# Patient Record
Sex: Female | Born: 1950
Health system: Southern US, Community
[De-identification: ages and names within clinical notes are randomized; demographics above are authoritative.]

## PROBLEM LIST (undated history)

## (undated) DIAGNOSIS — F419 Anxiety disorder, unspecified: Secondary | ICD-10-CM

## (undated) DIAGNOSIS — E782 Mixed hyperlipidemia: Secondary | ICD-10-CM

## (undated) DIAGNOSIS — K429 Umbilical hernia without obstruction or gangrene: Secondary | ICD-10-CM

## (undated) DIAGNOSIS — M1990 Unspecified osteoarthritis, unspecified site: Secondary | ICD-10-CM

## (undated) DIAGNOSIS — T7840XA Allergy, unspecified, initial encounter: Secondary | ICD-10-CM

## (undated) DIAGNOSIS — E039 Hypothyroidism, unspecified: Secondary | ICD-10-CM

## (undated) DIAGNOSIS — G43909 Migraine, unspecified, not intractable, without status migrainosus: Secondary | ICD-10-CM

## (undated) DIAGNOSIS — R002 Palpitations: Secondary | ICD-10-CM

## (undated) DIAGNOSIS — M109 Gout, unspecified: Secondary | ICD-10-CM

## (undated) DIAGNOSIS — B009 Herpesviral infection, unspecified: Secondary | ICD-10-CM

## (undated) DIAGNOSIS — G3184 Mild cognitive impairment, so stated: Secondary | ICD-10-CM

## (undated) DIAGNOSIS — J45909 Unspecified asthma, uncomplicated: Secondary | ICD-10-CM

## (undated) DIAGNOSIS — E119 Type 2 diabetes mellitus without complications: Secondary | ICD-10-CM

## (undated) DIAGNOSIS — K589 Irritable bowel syndrome without diarrhea: Secondary | ICD-10-CM

## (undated) DIAGNOSIS — G4730 Sleep apnea, unspecified: Secondary | ICD-10-CM

## (undated) DIAGNOSIS — F32A Depression, unspecified: Secondary | ICD-10-CM

## (undated) DIAGNOSIS — K219 Gastro-esophageal reflux disease without esophagitis: Secondary | ICD-10-CM

## (undated) DIAGNOSIS — IMO0001 Reserved for inherently not codable concepts without codable children: Secondary | ICD-10-CM

## (undated) DIAGNOSIS — I10 Essential (primary) hypertension: Secondary | ICD-10-CM

## (undated) DIAGNOSIS — H269 Unspecified cataract: Secondary | ICD-10-CM

## (undated) DIAGNOSIS — G4733 Obstructive sleep apnea (adult) (pediatric): Secondary | ICD-10-CM

## (undated) HISTORY — DX: Migraine, unspecified, not intractable, without status migrainosus: G43.909

## (undated) HISTORY — PX: EYE SURGERY: SHX253

## (undated) HISTORY — PX: COLONOSCOPY: SHX174

## (undated) HISTORY — DX: Allergy, unspecified, initial encounter: T78.40XA

## (undated) HISTORY — DX: Umbilical hernia without obstruction or gangrene: K42.9

## (undated) HISTORY — DX: Palpitations: R00.2

## (undated) HISTORY — DX: Depression, unspecified: F32.A

## (undated) HISTORY — DX: Obstructive sleep apnea (adult) (pediatric): G47.33

## (undated) HISTORY — DX: Unspecified cataract: H26.9

## (undated) HISTORY — DX: Anxiety disorder, unspecified: F41.9

## (undated) HISTORY — DX: Reserved for inherently not codable concepts without codable children: IMO0001

## (undated) HISTORY — DX: Hypothyroidism, unspecified: E03.9

## (undated) HISTORY — DX: Gout, unspecified: M10.9

## (undated) HISTORY — DX: Sleep apnea, unspecified: G47.30

## (undated) HISTORY — DX: Herpesviral infection, unspecified: B00.9

## (undated) HISTORY — PX: UMBILICAL HERNIA REPAIR: SHX196

## (undated) HISTORY — PX: CHOLECYSTECTOMY: SHX55

## (undated) HISTORY — PX: BREAST BIOPSY: SHX20

## (undated) HISTORY — DX: Mixed hyperlipidemia: E78.2

## (undated) HISTORY — PX: NASAL SINUS SURGERY: SHX719

## (undated) HISTORY — DX: Irritable bowel syndrome, unspecified: K58.9

## (undated) HISTORY — PX: FOOT SURGERY: SHX648

## (undated) HISTORY — DX: Unspecified osteoarthritis, unspecified site: M19.90

## (undated) HISTORY — DX: Unspecified asthma, uncomplicated: J45.909

## (undated) HISTORY — DX: Essential (primary) hypertension: I10

## (undated) HISTORY — PX: HERNIA REPAIR: SHX51

## (undated) HISTORY — DX: Gastro-esophageal reflux disease without esophagitis: K21.9

## (undated) HISTORY — DX: Type 2 diabetes mellitus without complications: E11.9

---

## 2004-01-21 ENCOUNTER — Ambulatory Visit: Payer: Self-pay

## 2004-10-05 ENCOUNTER — Emergency Department: Payer: Self-pay | Admitting: Internal Medicine

## 2004-10-05 ENCOUNTER — Other Ambulatory Visit: Payer: Self-pay

## 2005-04-01 ENCOUNTER — Ambulatory Visit: Payer: Self-pay | Admitting: Family Medicine

## 2005-11-03 ENCOUNTER — Ambulatory Visit: Payer: Self-pay | Admitting: Family Medicine

## 2006-07-22 LAB — HM COLONOSCOPY: HM Colonoscopy: NORMAL

## 2006-10-24 DIAGNOSIS — K219 Gastro-esophageal reflux disease without esophagitis: Secondary | ICD-10-CM | POA: Insufficient documentation

## 2006-10-24 DIAGNOSIS — E039 Hypothyroidism, unspecified: Secondary | ICD-10-CM | POA: Insufficient documentation

## 2006-10-24 DIAGNOSIS — M1990 Unspecified osteoarthritis, unspecified site: Secondary | ICD-10-CM | POA: Insufficient documentation

## 2007-05-18 DIAGNOSIS — T50905A Adverse effect of unspecified drugs, medicaments and biological substances, initial encounter: Secondary | ICD-10-CM | POA: Insufficient documentation

## 2007-09-13 ENCOUNTER — Ambulatory Visit: Payer: Self-pay | Admitting: Family Medicine

## 2007-12-13 DIAGNOSIS — M109 Gout, unspecified: Secondary | ICD-10-CM | POA: Insufficient documentation

## 2008-01-05 ENCOUNTER — Ambulatory Visit: Payer: Self-pay | Admitting: General Surgery

## 2008-01-12 ENCOUNTER — Ambulatory Visit: Payer: Self-pay | Admitting: General Surgery

## 2008-05-22 ENCOUNTER — Ambulatory Visit: Payer: Self-pay | Admitting: Family Medicine

## 2008-06-05 ENCOUNTER — Ambulatory Visit: Payer: Self-pay | Admitting: Family Medicine

## 2009-05-13 ENCOUNTER — Ambulatory Visit: Payer: Self-pay | Admitting: Family Medicine

## 2010-02-23 ENCOUNTER — Ambulatory Visit: Payer: Self-pay | Admitting: Family Medicine

## 2010-10-19 ENCOUNTER — Ambulatory Visit: Payer: Self-pay | Admitting: Family Medicine

## 2012-11-02 ENCOUNTER — Ambulatory Visit: Payer: Self-pay | Admitting: Nurse Practitioner

## 2012-11-02 LAB — HM MAMMOGRAPHY: HM Mammogram: NORMAL

## 2012-12-29 ENCOUNTER — Ambulatory Visit (INDEPENDENT_AMBULATORY_CARE_PROVIDER_SITE_OTHER): Payer: 59 | Admitting: Cardiovascular Disease

## 2012-12-29 ENCOUNTER — Encounter: Payer: Self-pay | Admitting: Cardiovascular Disease

## 2012-12-29 VITALS — BP 128/62 | HR 75 | Ht 67.0 in | Wt 309.5 lb

## 2012-12-29 DIAGNOSIS — E669 Obesity, unspecified: Secondary | ICD-10-CM | POA: Insufficient documentation

## 2012-12-29 DIAGNOSIS — I4902 Ventricular flutter: Secondary | ICD-10-CM

## 2012-12-29 DIAGNOSIS — E1169 Type 2 diabetes mellitus with other specified complication: Secondary | ICD-10-CM | POA: Insufficient documentation

## 2012-12-29 DIAGNOSIS — E119 Type 2 diabetes mellitus without complications: Secondary | ICD-10-CM

## 2012-12-29 DIAGNOSIS — R079 Chest pain, unspecified: Secondary | ICD-10-CM

## 2012-12-29 DIAGNOSIS — I498 Other specified cardiac arrhythmias: Secondary | ICD-10-CM

## 2012-12-29 DIAGNOSIS — E785 Hyperlipidemia, unspecified: Secondary | ICD-10-CM

## 2012-12-29 DIAGNOSIS — R262 Difficulty in walking, not elsewhere classified: Secondary | ICD-10-CM

## 2012-12-29 DIAGNOSIS — I10 Essential (primary) hypertension: Secondary | ICD-10-CM

## 2012-12-29 NOTE — Assessment & Plan Note (Addendum)
Notes from Dr.  Carlynn Purl were reviewed . Prior EKG an EKG today are essentially normal . Normal clinical exam in terms of her heart . Chest pain concerning for ischemia. She has diabetes and significant family history . She is unable to treadmill secondary to severe knee pain. She reports 3 minutes would be her maximum. Given this, we will order a pharmacologic Myoview. It would likely have to be a today's study given her obesity

## 2012-12-29 NOTE — Assessment & Plan Note (Signed)
We have encouraged continued exercise, careful diet management in an effort to lose weight. 

## 2012-12-29 NOTE — Assessment & Plan Note (Signed)
We did discuss ordering a Holter monitor. Her husband works at [employer] and he will determine whether such testing could be done in extensively through the company. Meanwhile she will monitor her heart rate for any arrhythmia using the Smart phone applications.

## 2012-12-29 NOTE — Patient Instructions (Addendum)
You are doing well. No medication changes were made.  We will schedule you for a stress test, myoview for chest tightness No food the morning of the test No caffeine for 24 hours before the test We will call you with the results  Call the office if you would like propranolol for evening chest fluttering Monitor your rhythm using smart phone apps (cardiograph, etc)  Please call us if you have new issues that need to be addressed  Follow up in one month if you continue to have fluttering If stress test is positive, we will need to schedule a follow up appt  [facility] MYOVIEW  Your caregiver has ordered a Stress Test with nuclear imaging. The purpose of this test is to evaluate the blood supply to your heart muscle. This procedure is referred to as a "Non-Invasive Stress Test." This is because other than having an IV started in your vein, nothing is inserted or "invades" your body. Cardiac stress tests are done to find areas of poor blood flow to the heart by determining the extent of coronary artery disease (CAD). Some patients exercise on a treadmill, which naturally increases the blood flow to your heart, while others who are  unable to walk on a treadmill due to physical limitations have a pharmacologic/chemical stress agent called Lexiscan . This medicine will mimic walking on a treadmill by temporarily increasing your coronary blood flow.   Please note: these test may take anywhere between 2-4 hours to complete  Dates of procedure:  Thursday, OCT 15 at 10:00      AND     Friday, OCT 16 at 9:30  PLEASE REPORT TO [facility] MEDICAL MALL ENTRANCE  THE VOLUNTEERS AT THE FIRST DESK WILL DIRECT YOU WHERE TO GO    PLEASE NOTIFY THE OFFICE AT LEAST 24 HOURS IN ADVANCE IF YOU ARE UNABLE TO KEEP YOUR APPOINTMENT.  [phone] AND  PLEASE NOTIFY NUCLEAR MEDICINE AT [facility] AT LEAST 24 HOURS IN ADVANCE IF YOU ARE UNABLE TO KEEP YOUR APPOINTMENT. [phone]  How to prepare for your Myoview  test:  1. Do not eat or drink after midnight 2. No caffeine for 24 hours prior to test 3. No smoking 24 hours prior to test. 4. Your medication may be taken with water.  If your doctor stopped a medication because of this test, do not take that medication. 5. Ladies, please do not wear dresses.  Skirts or pants are appropriate. Please wear a short sleeve shirt. 6. No perfume, cologne or lotion. 7. Wear comfortable walking shoes. No heels!

## 2012-12-29 NOTE — Assessment & Plan Note (Signed)
Blood pressure is well controlled on today's visit. No changes made to the medications. 

## 2012-12-29 NOTE — Progress Notes (Signed)
Patient ID: Sarah Gordon, female    DOB: 01-18-1951, 62 y.o.   MRN: [id number]  HPI Comments: Sarah Gordon is a pleasant 62 year old woman, patient of Dr. Charlette Caffey, referred by Dr. Carlynn Purl for symptoms of chest fluttering and chest pain. Sarah Gordon has a history of diabetes, obesity. Strong family history of CAD. Notes from Dr. Charlette Caffey indicate history of obstructive sleep apnea, fibromyalgia, IBS, hypothyroidism, hypertension, obesity, migraines.  No prior smoking history   Sarah Gordon reports that Sarah Gordon has had fluttering in Sarah Gordon chest for 10-12 years. Workup at that time included stress test and EKG. Symptoms have been worse over the past 2 weeks. Now, after Sarah Gordon chest fluttering, palpitations, Sarah Gordon has significant chest tightening in Sarah Gordon mediastinal area. He has been staying longer and longer. Sometimes Sarah Gordon presents with exertion, sometimes with rest. Symptoms have been more in the evenings. Last night Sarah Gordon had an episode of palpitations, then after this resolved, had chest tightening lasting 15 minutes. Sarah Gordon had episode last night, then 4 days ago on Sunday evening, then 4 days prior to that on Wednesday evening. Sarah Gordon is otherwise active at baseline and takes care of a 78-year-old grandchild. Works with a church as a part-time job.  Reports that Sarah Gordon brother has coronary artery disease, had bypass surgery 10 years ago. Mother had questionable heart disease and was a long-term diabetic  Most recent LDL was 117, TSH 0.29, hematocrit 36 EKG shows normal sinus rhythm with rate 75 beats per minute, no significant ST or T wave changes   Outpatient Encounter Prescriptions as of 12/29/2012  Medication Sig Dispense Refill  . albuterol (PROVENTIL HFA;VENTOLIN HFA) 108 (90 BASE) MCG/ACT inhaler Inhale 2 puffs into the lungs every 4 (four) hours as needed for wheezing.      Marland Kitchen allopurinol (ZYLOPRIM) 300 MG tablet Take 300 mg by mouth daily.      . budesonide-formoterol (SYMBICORT) 160-4.5 MCG/ACT inhaler Inhale 2 puffs into the  lungs 2 (two) times daily as needed.       . cyclobenzaprine (FLEXERIL) 10 MG tablet Take 10 mg by mouth 2 (two) times daily as needed.       . gabapentin (NEURONTIN) 100 MG capsule Take 100 mg by mouth 3 (three) times daily as needed.      Marland Kitchen levothyroxine (SYNTHROID, LEVOTHROID) 125 MCG tablet Take 125 mcg by mouth daily before breakfast.      . losartan-hydrochlorothiazide (HYZAAR) 100-25 MG per tablet Take 1 tablet by mouth daily.      Marland Kitchen lovastatin (MEVACOR) 40 MG tablet Take 40 mg by mouth at bedtime.      . metformin (FORTAMET) 500 MG (OSM) 24 hr tablet Take 500 mg by mouth daily with breakfast.      . naproxen (NAPROSYN) 500 MG tablet Take 500 mg by mouth 2 (two) times daily with a meal.      . omeprazole (PRILOSEC) 20 MG capsule Take 20 mg by mouth daily.      . SUMAtriptan (IMITREX) 100 MG tablet Take 100 mg by mouth every 2 (two) hours as needed for migraine. May repeat in 2 hours if headache persists or recurs.      . traMADol (ULTRAM) 50 MG tablet Take 50 mg by mouth every 6 (six) hours as needed for pain.      . valACYclovir (VALTREX) 1000 MG tablet Take 1,000 mg by mouth 2 (two) times daily as needed.        Review of Systems  Constitutional: Negative.   HENT: Negative.  Eyes: Negative.   Respiratory: Negative.   Cardiovascular: Negative.   Gastrointestinal: Negative.   Endocrine: Negative.   Musculoskeletal: Negative.   Skin: Negative.   Allergic/Immunologic: Negative.   Neurological: Negative.   Hematological: Negative.   Psychiatric/Behavioral: Negative.   All other systems reviewed and are negative.    BP 128/62  Pulse 75  Ht 5\' 7"  (1.702 m)  Wt 309 lb 8 oz (140.388 kg)  BMI 48.46 kg/m2  Physical Exam  Nursing note and vitals reviewed. Constitutional: Sarah Gordon is oriented to person, place, and time. Sarah Gordon appears well-developed and well-nourished.  HENT:  Head: Normocephalic.  Nose: Nose normal.  Mouth/Throat: Oropharynx is clear and moist.  Eyes: Conjunctivae  are normal. Pupils are equal, round, and reactive to light.  Neck: Normal range of motion. Neck supple. No JVD present.  Cardiovascular: Normal rate, regular rhythm, S1 normal, S2 normal, normal heart sounds and intact distal pulses.  Exam reveals no gallop and no friction rub.   No murmur heard. Pulmonary/Chest: Effort normal and breath sounds normal. No respiratory distress. Sarah Gordon has no wheezes. Sarah Gordon has no rales. Sarah Gordon exhibits no tenderness.  Abdominal: Soft. Bowel sounds are normal. Sarah Gordon exhibits no distension. There is no tenderness.  Musculoskeletal: Normal range of motion. Sarah Gordon exhibits no edema and no tenderness.  Lymphadenopathy:    Sarah Gordon has no cervical adenopathy.  Neurological: Sarah Gordon is alert and oriented to person, place, and time. Coordination normal.  Skin: Skin is warm and dry. No rash noted. No erythema.  Psychiatric: Sarah Gordon has a normal mood and affect. Sarah Gordon behavior is normal. Judgment and thought content normal.    Assessment and Plan

## 2012-12-29 NOTE — Assessment & Plan Note (Signed)
Chronic knee pain 

## 2012-12-29 NOTE — Assessment & Plan Note (Signed)
LDL 117. Ideally is a diabetic he should be 100 or less. Options would include changing to a stronger statin such as Lipitor. Additional options include adding fenofibrate, zetia, or WelChol.

## 2013-01-08 ENCOUNTER — Ambulatory Visit: Payer: Self-pay | Admitting: Cardiovascular Disease

## 2013-01-08 DIAGNOSIS — R079 Chest pain, unspecified: Secondary | ICD-10-CM

## 2013-01-10 ENCOUNTER — Other Ambulatory Visit: Payer: Self-pay

## 2013-01-10 DIAGNOSIS — I498 Other specified cardiac arrhythmias: Secondary | ICD-10-CM

## 2013-01-10 DIAGNOSIS — R262 Difficulty in walking, not elsewhere classified: Secondary | ICD-10-CM

## 2013-01-10 DIAGNOSIS — R079 Chest pain, unspecified: Secondary | ICD-10-CM

## 2013-01-29 ENCOUNTER — Telehealth: Payer: Self-pay

## 2013-01-29 NOTE — Telephone Encounter (Signed)
Spoke w/ pt.  She is aware of results and will keep f/u appt, as she continues to have occasional fluttering.

## 2013-01-29 NOTE — Telephone Encounter (Signed)
Message copied by Marilynne Halsted on Mon Jan 29, 2013  4:55 PM ------      Message from: Antonieta Iba      Created: Sat Jan 27, 2013  9:50 PM       Stress test was normal       No ischemia noted,      Good heart function ------

## 2013-01-31 ENCOUNTER — Encounter: Payer: Self-pay | Admitting: Cardiovascular Disease

## 2013-01-31 ENCOUNTER — Encounter (INDEPENDENT_AMBULATORY_CARE_PROVIDER_SITE_OTHER): Payer: Self-pay

## 2013-01-31 ENCOUNTER — Ambulatory Visit (INDEPENDENT_AMBULATORY_CARE_PROVIDER_SITE_OTHER): Payer: 59 | Admitting: Cardiovascular Disease

## 2013-01-31 VITALS — BP 120/86 | HR 84 | Ht 67.0 in | Wt 305.8 lb

## 2013-01-31 DIAGNOSIS — E119 Type 2 diabetes mellitus without complications: Secondary | ICD-10-CM

## 2013-01-31 DIAGNOSIS — I10 Essential (primary) hypertension: Secondary | ICD-10-CM

## 2013-01-31 DIAGNOSIS — R002 Palpitations: Secondary | ICD-10-CM

## 2013-01-31 DIAGNOSIS — E669 Obesity, unspecified: Secondary | ICD-10-CM

## 2013-01-31 DIAGNOSIS — I4902 Ventricular flutter: Secondary | ICD-10-CM

## 2013-01-31 DIAGNOSIS — I498 Other specified cardiac arrhythmias: Secondary | ICD-10-CM

## 2013-01-31 DIAGNOSIS — E785 Hyperlipidemia, unspecified: Secondary | ICD-10-CM

## 2013-01-31 DIAGNOSIS — E1169 Type 2 diabetes mellitus with other specified complication: Secondary | ICD-10-CM

## 2013-01-31 NOTE — Assessment & Plan Note (Signed)
I suspect she is having APCs. Unable to exclude PVCs. Symptoms are very short lived, rare. She does not want any medications for them. If symptoms get worse, repeat monitors could be ordered or could start low-dose beta blockers.

## 2013-01-31 NOTE — Progress Notes (Signed)
Patient ID: Sarah Gordon, female    DOB: 09/24/1950, 62 y.o.   MRN: [id number]  HPI Comments: Sarah Gordon is a pleasant 62 year old woman, patient of Dr. Charlette Caffey, referred by Dr. Carlynn Purl for symptoms of chest fluttering and chest pain.  history of diabetes, obesity. Strong family history of CAD. Notes from Dr. Charlette Caffey indicate history of obstructive sleep apnea, fibromyalgia, IBS, hypothyroidism, hypertension, obesity, migraines.  No prior smoking history    fluttering in her chest for 10-12 years. Previous workup including stress test and EKG.  She continues to be active at baseline and takes care of a 25-year-old grandchild. Works with a church as a part-time job. She continues to have episodes of skipped beats that happened 3 or 4 times per week. She describes it as a couple extra skips and then symptoms resolved. She's not particularly bothered by it, just notices the extra beats. She's not interested in any additional medications. Otherwise she feels well, is trying to lose weight. Thyroid medicine was decreased slightly for TSH 0.297.   brother has coronary artery disease, had bypass surgery 10 years ago. Mother had questionable heart disease and was a long-term diabetic  Most recent LDL was 117, TSH 0.29, hematocrit 36 EKG shows normal sinus rhythm with rate 83 beats per minute, no significant ST or T wave changes   Outpatient Encounter Prescriptions as of 01/31/2013  Medication Sig  . albuterol (PROVENTIL HFA;VENTOLIN HFA) 108 (90 BASE) MCG/ACT inhaler Inhale 2 puffs into the lungs every 4 (four) hours as needed for wheezing.  Marland Kitchen allopurinol (ZYLOPRIM) 300 MG tablet Take 300 mg by mouth daily.  Marland Kitchen atorvastatin (LIPITOR) 40 MG tablet Take 40 mg by mouth at bedtime.   . budesonide-formoterol (SYMBICORT) 160-4.5 MCG/ACT inhaler Inhale 2 puffs into the lungs 2 (two) times daily as needed.   . cyclobenzaprine (FLEXERIL) 10 MG tablet Take 10 mg by mouth 2 (two) times daily as needed.   .  gabapentin (NEURONTIN) 100 MG capsule Take 100 mg by mouth 3 (three) times daily as needed.  Marland Kitchen levothyroxine (SYNTHROID, LEVOTHROID) 125 MCG tablet Take 125 mcg by mouth 6 days. Take 1/2 once a week.  . losartan-hydrochlorothiazide (HYZAAR) 100-25 MG per tablet Take 1 tablet by mouth daily.  . metformin (FORTAMET) 500 MG (OSM) 24 hr tablet Take 500 mg by mouth daily with breakfast.  . naproxen (NAPROSYN) 500 MG tablet Take 500 mg by mouth 2 (two) times daily with a meal.  . omeprazole (PRILOSEC) 20 MG capsule Take 20 mg by mouth daily.  . SUMAtriptan (IMITREX) 100 MG tablet Take 100 mg by mouth every 2 (two) hours as needed for migraine. May repeat in 2 hours if headache persists or recurs.  . traMADol (ULTRAM) 50 MG tablet Take 50 mg by mouth every 6 (six) hours as needed for pain.  . valACYclovir (VALTREX) 1000 MG tablet Take 1,000 mg by mouth 2 (two) times daily as needed.  . [DISCONTINUED] lovastatin (MEVACOR) 40 MG tablet Take 40 mg by mouth at bedtime.     Review of Systems  Constitutional: Negative.   HENT: Negative.   Eyes: Negative.   Respiratory: Negative.   Cardiovascular: Positive for palpitations.  Gastrointestinal: Negative.   Endocrine: Negative.   Musculoskeletal: Negative.   Skin: Negative.   Allergic/Immunologic: Negative.   Neurological: Negative.   Hematological: Negative.   Psychiatric/Behavioral: Negative.   All other systems reviewed and are negative.    BP 120/86  Pulse 84  Ht 5\' 7"  (1.702 m)  Wt 305 lb 12 oz (138.687 kg)  BMI 47.88 kg/m2  Physical Exam  Nursing note and vitals reviewed. Constitutional: She is oriented to person, place, and time. She appears well-developed and well-nourished.  HENT:  Head: Normocephalic.  Nose: Nose normal.  Mouth/Throat: Oropharynx is clear and moist.  Eyes: Conjunctivae are normal. Pupils are equal, round, and reactive to light.  Neck: Normal range of motion. Neck supple. No JVD present.  Cardiovascular: Normal  rate, regular rhythm, S1 normal, S2 normal, normal heart sounds and intact distal pulses.  Exam reveals no gallop and no friction rub.   No murmur heard. Pulmonary/Chest: Effort normal and breath sounds normal. No respiratory distress. She has no wheezes. She has no rales. She exhibits no tenderness.  Abdominal: Soft. Bowel sounds are normal. She exhibits no distension. There is no tenderness.  Musculoskeletal: Normal range of motion. She exhibits no edema and no tenderness.  Lymphadenopathy:    She has no cervical adenopathy.  Neurological: She is alert and oriented to person, place, and time. Coordination normal.  Skin: Skin is warm and dry. No rash noted. No erythema.  Psychiatric: She has a normal mood and affect. Her behavior is normal. Judgment and thought content normal.    Assessment and Plan

## 2013-01-31 NOTE — Assessment & Plan Note (Signed)
Encouraged her to stay on her Lipitor. Recently changed from alternate statin. Tolerating Lipitor with no myalgias. Goal LDL less than 100

## 2013-01-31 NOTE — Assessment & Plan Note (Signed)
Blood pressure is well controlled on today's visit. No changes made to the medications. 

## 2013-01-31 NOTE — Assessment & Plan Note (Signed)
Encouraged her to try regular exercise program, watch her diet

## 2013-01-31 NOTE — Patient Instructions (Signed)
You are doing well. No medication changes were made.  Please call us if you have new issues that need to be addressed before your next appt.  Your physician wants you to follow-up in: 12 months.  You will receive a reminder letter in the mail two months in advance. If you don't receive a letter, please call our office to schedule the follow-up appointment. 

## 2013-01-31 NOTE — Assessment & Plan Note (Signed)
We have encouraged continued exercise, careful diet management in an effort to lose weight. 

## 2013-02-26 ENCOUNTER — Ambulatory Visit (INDEPENDENT_AMBULATORY_CARE_PROVIDER_SITE_OTHER): Payer: 59

## 2013-02-26 ENCOUNTER — Ambulatory Visit (INDEPENDENT_AMBULATORY_CARE_PROVIDER_SITE_OTHER): Payer: 59 | Admitting: Podiatry

## 2013-02-26 ENCOUNTER — Encounter: Payer: Self-pay | Admitting: Podiatry

## 2013-02-26 VITALS — BP 133/76 | HR 91 | Resp 16 | Ht 67.0 in | Wt 302.0 lb

## 2013-02-26 DIAGNOSIS — M79671 Pain in right foot: Secondary | ICD-10-CM

## 2013-02-26 DIAGNOSIS — M79609 Pain in unspecified limb: Secondary | ICD-10-CM

## 2013-02-26 DIAGNOSIS — M76829 Posterior tibial tendinitis, unspecified leg: Secondary | ICD-10-CM

## 2013-02-26 DIAGNOSIS — M76821 Posterior tibial tendinitis, right leg: Secondary | ICD-10-CM

## 2013-02-26 NOTE — Patient Instructions (Signed)
Posterior Tibial Tendon Tendinitis  with Rehab  Tendonitis is a condition that is characterized by inflammation of a tendon or the lining (sheath) that surrounds it. The inflammation is usually caused by damage to the tendon, such as a tendon tear (strain). Sprains are classified into three categories. Grade 1 sprains cause pain, but the tendon is not lengthened. Grade 2 sprains include a lengthened ligament due to the ligament being stretched or partially ruptured. With grade 2 sprains there is still function, although the function may be diminished. Grade 3 sprains are characterized by a complete tear of the tendon or muscle, and function is usually impaired. Posterior tibialis tendonitis is tendonitis of the posterior tibial tendon, which attaches muscles of the lower leg to the foot. The posterior tibial tendon is located in the back of the ankle and helps the body straighten (plantarflex) and rotate inward (medially rotate) the ankle.  SYMPTOMS   · Pain, tenderness, swelling, warmth, and/or redness over the back of the inner ankle at the posterior tibial tendon or the inner part of the mid-foot.  · Pain that worsens with plantarflexion or medial rotation of the ankle.  · A crackling sound (crepitation) when the tendon is moved or touched.  CAUSES   Posterior tibial tendonitis occurs when damage to the posterior tibial tendon starts an inflammatory response. Common mechanisms of injury include:  · Degenerative (occurs with aging) processes that weaken the tendon and make it more susceptible to injury.  · Stress placed on the tendon from an increase in the intensity, frequency, or duration of training.  · Direct trauma to the ankle.  · Returning to activity before a previous ankle injury is allowed to heal.  RISK INCREASES WITH:  · Activities that involve repetitive and/or stressful plantarflexion (jumping, kicking, or running up/down hills).  · Poor strength and flexibility.  · Flat feet.  · Previous injury to  the foot, ankle, or leg.  PREVENTION   · Warm up and stretch properly before activity.  · Allow for adequate recovery between workouts.  · Maintain physical fitness:  · Strength, flexibility, and endurance.  · Cardiovascular fitness.  · Learn and use proper technique. When possible, have a coach correct improper technique.  · Complete rehabilitation from a previous foot, ankle, or leg injury.  · If you have flat feet, wear arch supports (orthotics).  PROGNOSIS   If treated properly, then the symptoms of tendonitis usually resolve within 6 weeks. This period may be shorter for injuries caused by direct trauma.  RELATED COMPLICATIONS   · Prolonged healing time, if improperly treated or re-injured.  · Recurrent symptoms that result in a chronic problem.  · Partial or complete tendon tear (rupture) requiring surgery.  TREATMENT   Treatment initially involves the use of ice and medication to help reduce pain and inflammation. The use of strengthening and stretching exercises may help reduce pain with activity. These exercises may be performed at home or with referral to a therapist. Often times, your caregiver will recommend immobilizing the ankle to allow the tendon to heal. If you have flat feet, the you may be advised to wear orthotic arch supports. If symptoms persist for greater than 6 months despite non-surgical (conservative) treatment, then surgery may be recommended.  MEDICATION   · If pain medication is necessary, then nonsteroidal anti-inflammatory medications, such as aspirin and ibuprofen, or other minor pain relievers, such as acetaminophen, are often recommended.  · Do not take pain medication for 7 days before surgery.  ·   Prescription pain relievers may be given if deemed necessary by your caregiver. Use only as directed and only as much as you need.  · Corticosteroid injections may be given by your caregiver. These injections should be reserved for the most serious cases, because they may only be given a  certain number of times.  HEAT AND COLD  · Cold treatment (icing) relieves pain and reduces inflammation. Cold treatment should be applied for 10 to 15 minutes every 2 to 3 hours for inflammation and pain and immediately after any activity that aggravates your symptoms. Use ice packs or massage the area with a piece of ice (ice massage).  · Heat treatment may be used prior to performing the stretching and strengthening activities prescribed by your caregiver, physical therapist, or athletic trainer. Use a heat pack or soak the injury in warm water.  SEEK MEDICAL CARE IF:  · Treatment seems to offer no benefit, or the condition worsens.  · Any medications produce adverse side effects.  EXERCISES  RANGE OF MOTION (ROM) AND STRETCHING EXERCISES - Posterior Tibial Tendon Tendinitis  These exercises may help you when beginning to rehabilitate your injury. Your symptoms may resolve with or without further involvement from your physician, physical therapist or athletic trainer. While completing these exercises, remember:   · Restoring tissue flexibility helps normal motion to return to the joints. This allows healthier, less painful movement and activity.  · An effective stretch should be held for at least 30 seconds.  · A stretch should never be painful. You should only feel a gentle lengthening or release in the stretched tissue.  RANGE OF MOTION - Ankle Plantar Flexion   · Sit with your right / left leg crossed over your opposite knee.  · Use your opposite hand to pull the top of your foot and toes toward you.  · You should feel a gentle stretch on the top of your foot/ankle. Hold this position for __________ seconds.  Repeat __________ times. Complete this exercise __________ times per day.   RANGE OF MOTION - Ankle Eversion   · Sit with your right / left ankle crossed over your opposite knee.  · Grip your foot with your opposite hand, placing your thumb on the top of your foot and your fingers across the bottom of  your foot.  · Gently push your foot downward with a slight rotation so your littlest toes rise slightly  · You should feel a gentle stretch on the inside of your ankle. Hold the stretch for __________ seconds.  Repeat __________ times. Complete this exercise __________ times per day.   RANGE OF MOTION - Ankle Inversion   · Sit with your right / left ankle crossed over your opposite knee.  · Grip your foot with your opposite hand, placing your thumb on the bottom of your foot and your fingers across the top of your foot.  · Gently pull your foot so the smallest toe comes toward you and your thumb pushes the inside of the ball of your foot away from you.  · You should feel a gentle stretch on the outside of your ankle. Hold the stretch for __________ seconds.  Repeat __________ times. Complete this exercise __________ times per day.   RANGE OF MOTION - Dorsi/Plantar Flexion  · While sitting with your right / left knee straight, draw the top of your foot upwards by flexing your ankle. Then reverse the motion, pointing your toes downward.  · Hold each position for __________   seconds.  · After completing your first set of exercises, repeat this exercise with your knee bent.  Repeat __________ times. Complete this exercise __________ times per day.   RANGE OF MOTION - Ankle Alphabet  · Imagine your right / left big toe is a pen.  · Keeping your hip and knee still, write out the entire alphabet with your "pen." Make the letters as large as you can without increasing any discomfort.  Repeat __________ times. Complete this exercise __________ times per day.   STRETCH - Gastrocsoleus   · Sit with your right / left leg extended. Holding onto both ends of a belt or towel, loop it around the ball of your foot.  · Keeping your right / left ankle and foot relaxed and your knee straight, pull your foot and ankle toward you using the belt/towel.  · You should feel a gentle stretch behind your calf or knee. Hold this position for  __________ seconds.  Repeat __________ times. Complete this exercise __________ times per day.   STRETCH  Gastroc, Standing   · Place hands on wall.  · Extend right / left leg, keeping the front knee somewhat bent.  · Slightly point your toes inward on your back foot.  · Keeping your right / left heel on the floor and your knee straight, shift your weight toward the wall, not allowing your back to arch.  · You should feel a gentle stretch in the right / left calf. Hold this position for __________ seconds.  Repeat __________ times. Complete this stretch __________ times per day.  STRETCH  Soleus, Standing   · Place hands on wall.  · Extend right / left leg, keeping the other knee somewhat bent.  · Slightly point your toes inward on your back foot.  · Keep your right / left heel on the floor, bend your back knee, and slightly shift your weight over the back leg so that you feel a gentle stretch deep in your back calf.  · Hold this position for __________ seconds.  Repeat __________ times. Complete this stretch __________ times per day.  STRENGTHENING EXERCISES - Posterior Tibial Tendon Tendinitis  These exercises may help you when beginning to rehabilitate your injury. They may resolve your symptoms with or without further involvement from your physician, physical therapist or athletic trainer. While completing these exercises, remember:   · Muscles can gain both the endurance and the strength needed for everyday activities through controlled exercises.  · Complete these exercises as instructed by your physician, physical therapist or athletic trainer. Progress the resistance and repetitions only as guided.  STRENGTH - Dorsiflexors  · Secure a rubber exercise band/tubing to a fixed object (ie. table, pole) and loop the other end around your right / left foot.  · Sit on the floor facing the fixed object. The band/tubing should be slightly tense when your foot is relaxed.  · Slowly draw your foot back toward you using  your ankle and toes.  · Hold this position for __________ seconds. Slowly release the tension in the band and return your foot to the starting position.  Repeat __________ times. Complete this exercise __________ times per day.   STRENGTH - Towel Curls  · Sit in a chair positioned on a non-carpeted surface.  · Place your foot on a towel, keeping your heel on the floor.  · Pull the towel toward your heel by only curling your toes. Keep your heel on the floor.  · If instructed   by your physician, physical therapist or athletic trainer, add ____________________ at the end of the towel.  Repeat __________ times. Complete this exercise __________ times per day.  STRENGTH - Ankle Eversion   · Secure one end of a rubber exercise band/tubing to a fixed object (table, pole). Loop the other end around your foot just before your toes.  · Place your fists between your knees. This will focus your strengthening at your ankle.  · Drawing the band/tubing across your opposite foot, slowly, pull your little toe out and up. Make sure the band/tubing is positioned to resist the entire motion.  · Hold this position for __________ seconds.  · Have your muscles resist the band/tubing as it slowly pulls your foot back to the starting position.  Repeat __________ times. Complete this exercise __________ times per day.   STRENGTH - Ankle Inversion   · Secure one end of a rubber exercise band/tubing to a fixed object (table, pole). Loop the other end around your foot just before your toes.  · Place your fists between your knees. This will focus your strengthening at your ankle.  · Slowly, pull your big toe up and in, making sure the band/tubing is positioned to resist the entire motion.  · Hold this position for __________ seconds.  · Have your muscles resist the band/tubing as it slowly pulls your foot back to the starting position.  Repeat __________ times. Complete this exercises __________ times per day.   Document Released: 03/08/2005  Document Revised: 05/31/2011 Document Reviewed: 06/20/2008  [employer]® Patient Information ©2014 [employer].

## 2013-02-26 NOTE — Progress Notes (Signed)
Sarah Gordon presents today as a 63 year old white female with a chief complaint of pain to the medial and dorsomedial aspect of the right foot. She states this been going on for sometime now and has been considerably sore.  Objective: Vital signs are stable she is alert and oriented x3. Pulses are strongly palpable. She has tenderness on inversion against resistance and on palpation of the posterior tibial tendon as it courses beneath the medial malleolus extending to the navicular tuberosity. There is fluctuance in this area and radiographic evaluation does not demonstrate any type of osseous abnormalities in this area.  Assessment: Posterior tibial tendinitis.  Plan: Injected peritendinously today with 2 mg of dexamethasone and local anesthetic into the tendon sheath. She was then placed in a Cam Walker. She will continue her anti-inflammatories naproxen twice daily. We discussed icing and stretching therapy which she was given both oral and written home-going instructions for the care of this tendinitis. I will followup with her in one month. Should this not be well MRI will be necessary.

## 2013-03-26 ENCOUNTER — Encounter: Payer: Self-pay | Admitting: Podiatry

## 2013-03-26 ENCOUNTER — Ambulatory Visit (INDEPENDENT_AMBULATORY_CARE_PROVIDER_SITE_OTHER): Payer: 59 | Admitting: Podiatry

## 2013-03-26 VITALS — BP 127/78 | HR 96 | Resp 16 | Ht 67.0 in | Wt 302.0 lb

## 2013-03-26 DIAGNOSIS — M76821 Posterior tibial tendinitis, right leg: Secondary | ICD-10-CM

## 2013-03-26 DIAGNOSIS — M76829 Posterior tibial tendinitis, unspecified leg: Secondary | ICD-10-CM

## 2013-03-26 NOTE — Progress Notes (Signed)
She presents today for followup of her posterior tibial tendon tendinitis and pes planus. She states that she's 80-90% better and has no pain whatsoever.  Objective: Vital signs are stable she is alert and oriented x3 she has no tenderness on palpation of the posterior tibial tendon noted nor does she have tenderness on palpation of the tendon in eversion or inversion.  Assessment: Well-healing posterior tibial tendinitis.  Plan: Continue all conservative therapies for at least one month.

## 2013-04-23 ENCOUNTER — Ambulatory Visit (INDEPENDENT_AMBULATORY_CARE_PROVIDER_SITE_OTHER): Payer: 59 | Admitting: Podiatry

## 2013-04-23 ENCOUNTER — Encounter: Payer: Self-pay | Admitting: Podiatry

## 2013-04-23 VITALS — BP 124/73 | HR 96 | Resp 16 | Ht 67.0 in | Wt 300.0 lb

## 2013-04-23 DIAGNOSIS — M76821 Posterior tibial tendinitis, right leg: Secondary | ICD-10-CM

## 2013-04-23 DIAGNOSIS — M76829 Posterior tibial tendinitis, unspecified leg: Secondary | ICD-10-CM

## 2013-04-23 NOTE — Progress Notes (Signed)
She presents today for followup of her posterior tibial tendinitis. States that the splint hurts by the end of the day. She states that she's 80% better than she was before we started our treatment. She denies any further trauma to the foot.  Objective: Vital signs are stable she is alert and oriented x3. She has mild tenderness on palpation the posterior tibial tendon as insertion on the navicular bone.  Assessment:

## 2013-08-21 ENCOUNTER — Ambulatory Visit (INDEPENDENT_AMBULATORY_CARE_PROVIDER_SITE_OTHER): Payer: 59 | Admitting: Podiatry

## 2013-08-21 ENCOUNTER — Ambulatory Visit (INDEPENDENT_AMBULATORY_CARE_PROVIDER_SITE_OTHER): Payer: 59

## 2013-08-21 VITALS — BP 147/85 | HR 101 | Resp 16

## 2013-08-21 DIAGNOSIS — M7750 Other enthesopathy of unspecified foot: Secondary | ICD-10-CM

## 2013-08-21 DIAGNOSIS — M779 Enthesopathy, unspecified: Secondary | ICD-10-CM

## 2013-08-21 MED ORDER — TRIAMCINOLONE ACETONIDE 10 MG/ML IJ SUSP
10.0000 mg | Freq: Once | INTRAMUSCULAR | Status: AC
  Administered 2013-08-21: 10 mg

## 2013-08-21 NOTE — Progress Notes (Signed)
Subjective:     Patient ID: Sarah Gordon, female   DOB: May 09, 1950, 63 y.o.   MRN: [id number]  HPI patient states it is the outside of the right foot that hurts now I'm still having pain on the inside and I know I need new inserts because my arches or so flat   Review of Systems     Objective:   Physical Exam Neurovascular status unchanged with patient well oriented x3 and is found to have intense discomfort in the sinus tarsi right and slightly more lateral to this area.   also has pain in the posterior tibial tendon right not to the same intensity Assessment:     Sinus tarsitis right with posterior tibial tendinitis right which is moderately improved    Plan:     Reviewed her foot structural issues in scanned for custom orthotic devices. Injected the sinus tarsi right 3 mg Kenalog 5 mg Xylocaine Marcaine mixture

## 2013-08-31 ENCOUNTER — Ambulatory Visit (INDEPENDENT_AMBULATORY_CARE_PROVIDER_SITE_OTHER): Payer: 59 | Admitting: *Deleted

## 2013-08-31 DIAGNOSIS — M76829 Posterior tibial tendinitis, unspecified leg: Secondary | ICD-10-CM

## 2013-08-31 DIAGNOSIS — M76821 Posterior tibial tendinitis, right leg: Secondary | ICD-10-CM

## 2013-08-31 NOTE — Progress Notes (Signed)
Pt presents for orthotic pick up verbal and written instructions given

## 2013-08-31 NOTE — Patient Instructions (Signed)

## 2013-09-28 ENCOUNTER — Other Ambulatory Visit: Payer: 59

## 2013-11-29 ENCOUNTER — Ambulatory Visit: Payer: Self-pay | Admitting: Family Medicine

## 2013-12-27 LAB — LIPID PANEL
Cholesterol: 161 mg/dL (ref 0–200)
HDL: 43 mg/dL (ref 35–70)
LDL Cholesterol: 97 mg/dL
Triglycerides: 103 mg/dL (ref 40–160)

## 2014-01-15 ENCOUNTER — Encounter: Payer: Self-pay | Admitting: Podiatry

## 2014-01-15 ENCOUNTER — Ambulatory Visit (INDEPENDENT_AMBULATORY_CARE_PROVIDER_SITE_OTHER): Payer: 59 | Admitting: Podiatry

## 2014-01-15 VITALS — BP 117/72 | HR 77 | Resp 16

## 2014-01-15 DIAGNOSIS — L03032 Cellulitis of left toe: Secondary | ICD-10-CM

## 2014-01-15 DIAGNOSIS — L84 Corns and callosities: Secondary | ICD-10-CM

## 2014-01-15 MED ORDER — CEPHALEXIN 500 MG PO CAPS: 500.0000 mg | ORAL_CAPSULE | Freq: Three times a day (TID) | ORAL | Status: DC

## 2014-01-15 NOTE — Progress Notes (Signed)
Patient ID: Sarah Gordon, female   DOB: Jan 06, 1951, 63 y.o.   MRN: [id number]  Subjective: Sarah Gordon, 63 year old female, presents the office today with complaints of left painful lesion on the plantar aspect of the left big toe. She states there is a thickened callus area over the area which is painful. She states that the area started approximately 1 month ago when she noticed the skin was peeling off of this area. After that she developed a thick callused area. She states that this is an area of prior callus formation and a callus is not abnormal to have in this area. There is tenderness directly over the callused area. She does report redness to the toe however she states it has decreased. Denies any systemic complaints such as fevers, chills, nausea, vomiting. No other complaints at this time.   Objective: AAO x3, NAD DP/PT pulses palpable bilaterally, CRT less than 3 seconds Protective sensation intact with Simms Weinstein monofilament, vibratory sensation intact, Achilles tendon reflex intact On the plantar aspect of the left hallux there is a thick hyperkeratotic lesion with some evidence of dried blood within the callus. Upon debridement the underlying skin was intact and there is no open lesions. There is mild erythema to the plantar aspect of the left hallux with a slight increase in warmth. There is no areas of fluctuance or crepitus. No drainage was identified. No ascending cellulitis. No pain with range of motion of the MTPJ. No pinpoint bony tenderness or pain with vibratory sensation.  No other lesions identified. No other areas of tenderness to b/l feet. No calf pain, swelling, warmth, erythema.   Assessment : Left plantar hallux hyperkeratotic lesion with slight erythema and increased warmth surrounding the area.   Plan : -Treatment options discussed including alternatives, risks, complications.  -Hyperkeratotic lesion sharply debrided 1 without complications.  -Due to the  increase in warmth and slight erythema will start antibiotics, prescribed keflex.  -Monitoring clinical signs or symptoms of worsening infection and instructed to call the office immediately if any are to occur ago directly to the emergency room.  -Follow up in 2 weeks or sooner if any problems are to arise or any change in symptoms. In the meantime call the office for any questions, concerns.

## 2014-01-15 NOTE — Patient Instructions (Signed)
Monitor for any signs/symptoms of infection. Call the office immediately if any occur or go directly to the emergency room. Call with any questions/concerns.  

## 2014-01-29 ENCOUNTER — Ambulatory Visit: Payer: 59 | Admitting: Podiatry

## 2014-01-29 ENCOUNTER — Ambulatory Visit (INDEPENDENT_AMBULATORY_CARE_PROVIDER_SITE_OTHER): Payer: 59 | Admitting: Podiatry

## 2014-01-29 VITALS — BP 104/63 | HR 73 | Resp 16

## 2014-01-29 DIAGNOSIS — L84 Corns and callosities: Secondary | ICD-10-CM

## 2014-01-29 DIAGNOSIS — L03032 Cellulitis of left toe: Secondary | ICD-10-CM

## 2014-01-29 NOTE — Patient Instructions (Signed)
Diabetes and Foot Care Diabetes may cause you to have problems because of poor blood supply (circulation) to your feet and legs. This may cause the skin on your feet to become thinner, break easier, and heal more slowly. Your skin may become dry, and the skin may peel and crack. You may also have nerve damage in your legs and feet causing decreased feeling in them. You may not notice minor injuries to your feet that could lead to infections or more serious problems. Taking care of your feet is one of the most important things you can do for yourself.  HOME CARE INSTRUCTIONS  Wear shoes at all times, even in the house. Do not go barefoot. Bare feet are easily injured.  Check your feet daily for blisters, cuts, and redness. If you cannot see the bottom of your feet, use a mirror or ask someone for help.  Wash your feet with warm water (do not use hot water) and mild soap. Then pat your feet and the areas between your toes until they are completely dry. Do not soak your feet as this can dry your skin.  Apply a moisturizing lotion or petroleum jelly (that does not contain alcohol and is unscented) to the skin on your feet and to dry, brittle toenails. Do not apply lotion between your toes.  Trim your toenails straight across. Do not dig under them or around the cuticle. File the edges of your nails with an emery board or nail file.  Do not cut corns or calluses or try to remove them with medicine.  Wear clean socks or stockings every day. Make sure they are not too tight. Do not wear knee-high stockings since they may decrease blood flow to your legs.  Wear shoes that fit properly and have enough cushioning. To break in new shoes, wear them for just a few hours a day. This prevents you from injuring your feet. Always look in your shoes before you put them on to be sure there are no objects inside.  Do not cross your legs. This may decrease the blood flow to your feet.  If you find a minor scrape,  cut, or break in the skin on your feet, keep it and the skin around it clean and dry. These areas may be cleansed with mild soap and water. Do not cleanse the area with peroxide, alcohol, or iodine.  When you remove an adhesive bandage, be sure not to damage the skin around it.  If you have a wound, look at it several times a day to make sure it is healing.  Do not use heating pads or hot water bottles. They may burn your skin. If you have lost feeling in your feet or legs, you may not know it is happening until it is too late.  Make sure your health care provider performs a complete foot exam at least annually or more often if you have foot problems. Report any cuts, sores, or bruises to your health care provider immediately. SEEK MEDICAL CARE IF:   You have an injury that is not healing.  You have cuts or breaks in the skin.  You have an ingrown nail.  You notice redness on your legs or feet.  You feel burning or tingling in your legs or feet.  You have pain or cramps in your legs and feet.  Your legs or feet are numb.  Your feet always feel cold. SEEK IMMEDIATE MEDICAL CARE IF:   There is increasing redness,   swelling, or pain in or around a wound.  There is a red line that goes up your leg.  Pus is coming from a wound.  You develop a fever or as directed by your health care provider.  You notice a bad smell coming from an ulcer or wound. Document Released: 03/05/2000 Document Revised: 11/08/2012 Document Reviewed: 08/15/2012 [employer] Patient Information 2015 [employer]. This information is not intended to replace advice given to you by your health care provider. Make sure you discuss any questions you have with your health care provider.  

## 2014-01-31 NOTE — Progress Notes (Signed)
Patient ID: Sarah Gordon, female   DOB: [birth year], 63 y.o.   MRN: [id number]  Subjective: Sarah Gordon, , 63 year old female, presents the office today for follow-up evaluation of left plantar hallux hyperkeratotic lesion and cellulitis. She states that she has finished the course of antibiotics and she has not noticed any recent redness, and notes it is much improved. She denies any drainage, streaking, pain associated with the area. No other complaints at this time. No acute changes since last appointment. Denies any systemic complaints as fevers, chills, nausea, vomiting.  Objective: AAO x3, NAD DP/PT pulses palpable bilaterally, CRT less than 3 seconds Protective sensation intact with Simms Weinstein monofilament, vibratory sensation intact, Achilles tendon reflex intact On the plantar aspect of the left hallux there is a thin hyperkeratotic lesion. Upon debridement no underlying open lesions identified. There is no erythema to the hallux at this time or any ascending cellulitis. No edema, drainage. No areas of fluctuance or crepitus. There is no clinical signs of infection. There is a small superficial abrasion on the plantar lateral aspect of the hallux for which the patient states that she had a piece of loose skin that she peeled off and had a little bit of bleeding. There is no signs of infection. No other lesions identified. No calf pain, swelling, warmth, erythema  Assessment: 63 year old female with resolved cellulitis left hallux  Plan: -Treatment options discussed including alternatives, risks, complications -Hyperkeratotic lesion lightly debrided without complications. No underlying skin lesion. No clinical signs of infection. -Continue with orthotic to help alleviate pressure off the area. -Antibiotic ointment and a Band-Aid over the site of superficial abrasion. -Monitor for any clinical signs or symptoms of infection and to call the office immediately if any are to occur or go to  the ER. -Continue to monitor for any changes. Call the office any questions, concerns, change in symptoms. Follow-up as needed.

## 2014-03-06 ENCOUNTER — Ambulatory Visit: Payer: Self-pay | Admitting: Family Medicine

## 2014-03-06 LAB — BASIC METABOLIC PANEL: Glucose: 87 mg/dL

## 2014-03-06 LAB — HEMOGLOBIN A1C: Hgb A1c MFr Bld: 5.5 % (ref 4.0–6.0)

## 2014-05-02 ENCOUNTER — Ambulatory Visit: Payer: 59 | Admitting: Podiatry

## 2014-05-07 ENCOUNTER — Ambulatory Visit: Payer: Self-pay | Admitting: Podiatry

## 2014-05-09 ENCOUNTER — Ambulatory Visit: Payer: Commercial Managed Care - PPO

## 2014-05-09 ENCOUNTER — Ambulatory Visit (INDEPENDENT_AMBULATORY_CARE_PROVIDER_SITE_OTHER): Payer: Commercial Managed Care - PPO | Admitting: Podiatry

## 2014-05-09 VITALS — BP 124/74 | HR 73 | Resp 16 | Ht 67.0 in | Wt 229.0 lb

## 2014-05-09 DIAGNOSIS — R52 Pain, unspecified: Secondary | ICD-10-CM

## 2014-05-09 DIAGNOSIS — M722 Plantar fascial fibromatosis: Secondary | ICD-10-CM

## 2014-05-09 DIAGNOSIS — M25572 Pain in left ankle and joints of left foot: Secondary | ICD-10-CM

## 2014-05-10 NOTE — Progress Notes (Signed)
Patient ID: Sarah Gordon, female   DOB: 1950/09/09, 64 y.o.   MRN: [id number]  Subjective: 64 year old female presents the office today with complaints of left ankle and right foot pain. She states that over the last several months she has lost weight and she feels that her orthotics are not fitting well which are causing some discomfort in her feet. She states she does not have pain on a daily basis and is intermittent in nature. She denies any history of injury or trauma to the area. Denies any overlying swelling or redness. No other complaints at this time. No acute changes since last appointment.  Objective: AAO 3, NAD DP/PT pulses palpable, CRT less than 3 seconds Protective sensation intact with Simms Weinstein monofilament, vibratory sensation intact, Achilles tendon reflex intact. On the left ankle there is tenderness along the anterior lateral aspect of the ankle joint and along the anterior aspect of the ankle. There is mild discomfort and ankle joint range of motion however no crepitation. There is no pain with subtalar joint range of motion although it is somewhat limited. There is no pinpoint bony tenderness and vibratory sensation along the fibula, tibia or other areas of the foot. On the right foot there is mild tenderness to palpation along the plantar medial tubercle of the calcaneus at the insertion the plantar fascia. There is no pain with lateral compression of the calcaneus or pain the vibratory sensation. No pain on the posterior aspect of the calcaneus on the course/insertion of the Achilles tendon. There is atrophy of the fat pad on the heel. There is no overlying edema, erythema, increase in warmth bilaterally. MMT 5/5, ROM WNL Orthotics appear to be worn out and are not appropriately fitting her foot type. No open lesions or pre-ulcer lesions identified. No pain with calf compression, Warmth, Erythema.  Assessment: 64 year old female with left ankle pain; right heel pain,  likely plantar fasciitis  Plan: -X-rays were obtained and reviewed the patient. There is significant arthritis and degenerative changes present. -Treatment options were discussed the patient including alternatives, risks, complications. -At this time recommended a new pair of orthotics which the patient agrees. The patient was scanned for orthotics may be sent to [facility] labs. -Discussed stretching exercises for the right foot. Ice to the area. Would hold off a steroid injection to the right foot as there is actually the fat pad. -Follow-up after the orthotics arise. In the meantime, occurs call the office in questions, concerns, changes symptoms.

## 2014-08-07 ENCOUNTER — Encounter: Payer: Self-pay | Admitting: General Surgery

## 2014-08-07 ENCOUNTER — Telehealth: Payer: Self-pay | Admitting: *Deleted

## 2014-08-07 ENCOUNTER — Ambulatory Visit (INDEPENDENT_AMBULATORY_CARE_PROVIDER_SITE_OTHER): Payer: 59 | Admitting: General Surgery

## 2014-08-07 VITALS — BP 134/82 | HR 81 | Resp 14 | Ht 67.0 in | Wt 229.0 lb

## 2014-08-07 DIAGNOSIS — R1314 Dysphagia, pharyngoesophageal phase: Secondary | ICD-10-CM

## 2014-08-07 NOTE — Telephone Encounter (Signed)
I was scanned for orthotics about 2 months ago and I haven't heard a word.  Please call me.

## 2014-08-07 NOTE — Telephone Encounter (Signed)
Jennifer-can you please call her. Thanks!

## 2014-08-07 NOTE — Telephone Encounter (Signed)
Patient called the office back to reschedule colonoscopy from 08-14-14 to 08-20-14 at [facility].   This patient did not have transportation that day as she originally thought.

## 2014-08-07 NOTE — Telephone Encounter (Signed)
Orthotics are here! Will notify patient for appointment.

## 2014-08-07 NOTE — Progress Notes (Signed)
Patient ID: Sarah Gordon, female   DOB: 1950-11-12, 64 y.o.   MRN: [id number]  Chief Complaint  Patient presents with  . Abdominal Pain    HPI Sarah Gordon is a 64 y.o. female here for problems with swallowing and abdominal bloating. She states that when she swallows food it feels as if it is getting stuck in her esophagus. She describes it as a large knot feeling. She states that later on she will have swelling and bloating and a feeling of heaviness.This has been ongoing for three weeks. She has a history of IBS but she feels that this is different. She reports a weight loss of about 8 pounds in the last 2 weeks. She reports that she can only get relief with the use of digestive supplements. She also reports a dry cough with hoarseness in the last 3 days. She recently finished a regimend of Prednisone about a week ago. HPI  Past Medical History  Diagnosis Date  . Obstructive sleep apnea (adult) (pediatric)   . Myalgia and myositis, unspecified   . Type II or unspecified type diabetes mellitus without mention of complication, not stated as uncontrolled   . Irritable bowel syndrome   . Unspecified hypothyroidism   . Mixed hyperlipidemia   . Unspecified essential hypertension   . Esophageal reflux   . Herpes simplex without mention of complication   . Localized osteoarthrosis not specified whether primary or secondary, unspecified site   . Migraine, unspecified, without mention of intractable migraine without mention of status migrainosus   . Gouty arthropathy, unspecified   . Umbilical hernia without mention of obstruction or gangrene   . Palpitations   . Asthma     Past Surgical History  Procedure Laterality Date  . Foot surgery Left     heel spur  . Nasal sinus surgery  1980s  . Umbilical hernia repair    . Hernia repair      umbilical  . Colonoscopy      Dr Alveta Heimlich    Family History  Problem Relation Age of Onset  . Hypertension Mother   . Hyperlipidemia Mother   . Heart  disease Brother 31    CABG x 3   . Heart attack Brother 56  . Lupus Sister     Social History History  Substance Use Topics  . Smoking status: Never Smoker   . Smokeless tobacco: Never Used  . Alcohol Use: No     Comment: RARELY    Allergies  Allergen Reactions  . Aspir-81 [Aspirin]     Coagulation disorder  . Fluzone [Flu Virus Vaccine]   . Topamax [Topiramate]   . Vascepa [Epa Ethyl Ester] Swelling  . Voltaren [Diclofenac Sodium]     Current Outpatient Prescriptions  Medication Sig Dispense Refill  . albuterol (PROVENTIL HFA;VENTOLIN HFA) 108 (90 BASE) MCG/ACT inhaler Inhale 2 puffs into the lungs every 4 (four) hours as needed for wheezing.    Marland Kitchen allopurinol (ZYLOPRIM) 300 MG tablet Take 300 mg by mouth daily.    Marland Kitchen atorvastatin (LIPITOR) 40 MG tablet Take 40 mg by mouth at bedtime.     . budesonide-formoterol (SYMBICORT) 160-4.5 MCG/ACT inhaler Inhale 2 puffs into the lungs 2 (two) times daily as needed.     . DHA-EPA-Vitamin E (OMEGA-3 COMPLEX PO) Take 2 capsules by mouth daily.    Marland Kitchen gabapentin (NEURONTIN) 100 MG capsule Take 100 mg by mouth 3 (three) times daily as needed.    Marland Kitchen losartan (COZAAR) 25 MG  tablet Take 5,000 mg by mouth 3 (three) times a week.     . metformin (FORTAMET) 500 MG (OSM) 24 hr tablet Take 500 mg by mouth daily with breakfast.    . NON FORMULARY Take 1 tablet by mouth 2 (two) times daily as needed. TriEase    . NON FORMULARY Take 2 capsules by mouth daily. MicroPlex Food Supplement    . NON FORMULARY Take 2 capsules by mouth daily. Alpha CRS +    . omeprazole (PRILOSEC) 20 MG capsule Take 20 mg by mouth daily.    . SUMAtriptan (IMITREX) 100 MG tablet Take 100 mg by mouth every 2 (two) hours as needed for migraine. May repeat in 2 hours if headache persists or recurs.    . valACYclovir (VALTREX) 1000 MG tablet Take 1,000 mg by mouth 2 (two) times daily as needed.    . cyclobenzaprine (FLEXERIL) 10 MG tablet Take 10 mg by mouth 2 (two) times daily as  needed.     Marland Kitchen levothyroxine (SYNTHROID, LEVOTHROID) 125 MCG tablet Take 125 mcg by mouth 6 days. Take 1/2 once a week.     No current facility-administered medications for this visit.    Review of Systems Review of Systems  Constitutional: Negative.   Respiratory: Positive for cough. Negative for apnea, choking, chest tightness, shortness of breath, wheezing and stridor.   Gastrointestinal: Positive for nausea (mild) and abdominal distention. Negative for vomiting, abdominal pain, diarrhea, constipation, blood in stool, anal bleeding and rectal pain.    Blood pressure 134/82, pulse 81, resp. rate 14, height 5\' 7"  (1.702 m), weight 229 lb (103.874 kg).  Physical Exam Physical Exam  Constitutional: She is oriented to person, place, and time. She appears well-developed and well-nourished.  Eyes: Conjunctivae are normal. No scleral icterus.  Neck: Neck supple.  Cardiovascular: Normal rate, regular rhythm and normal heart sounds.   Pulmonary/Chest: Effort normal and breath sounds normal.  Abdominal: Soft. Normal appearance and bowel sounds are normal. There is no splenomegaly or hepatomegaly. There is tenderness in the left upper quadrant.  Lymphadenopathy:    She has no cervical adenopathy.  Neurological: She is alert and oriented to person, place, and time.  Skin: Skin is warm and dry.    Data Reviewed    Assessment    Dysphagia. Pt has had reflux symptoms but controlled with omeprazole.      Plan   Endoscopy recommended. Procedure risks/benefits explained. Pt is agreeable. Patient has been scheduled for an upper endoscopy on 08-14-14 at [facility].     PCP: Dr Deon Pilling 08/07/2014, 10:52 AM

## 2014-08-07 NOTE — Patient Instructions (Addendum)
Esophagogastroduodenoscopy Esophagogastroduodenoscopy (EGD) is a procedure to examine the lining of the esophagus, stomach, and first part of the small intestine (duodenum). A long, flexible, lighted tube with a camera attached (endoscope) is inserted down the throat to view these organs. This procedure is done to detect problems or abnormalities, such as inflammation, bleeding, ulcers, or growths, in order to treat them. The procedure lasts about 5-20 minutes. It is usually an outpatient procedure, but it may need to be performed in emergency cases in the hospital. LET YOUR CAREGIVER KNOW ABOUT:   Allergies to food or medicine.  All medicines you are taking, including vitamins, herbs, eyedrops, and over-the-counter medicines and creams.  Use of steroids (by mouth or creams).  Previous problems you or members of your family have had with the use of anesthetics.  Any blood disorders you have.  Previous surgeries you have had.  Other health problems you have.  Possibility of pregnancy, if this applies. RISKS AND COMPLICATIONS  Generally, EGD is a safe procedure. However, as with any procedure, complications can occur. Possible complications include:  Infection.  Bleeding.  Tearing (perforation) of the esophagus, stomach, or duodenum.  Difficulty breathing or not being able to breath.  Excessive sweating.  Spasms of the larynx.  Slowed heartbeat.  Low blood pressure. BEFORE THE PROCEDURE  Do not eat or drink anything for 6-8 hours before the procedure or as directed by your caregiver.  Ask your caregiver about changing or stopping your regular medicines.  If you wear dentures, be prepared to remove them before the procedure.  Arrange for someone to drive you home after the procedure. PROCEDURE   A vein will be accessed to give medicines and fluids. A medicine to relax you (sedative) and a pain reliever will be given through that access into the vein.  A numbing medicine  (local anesthetic) may be sprayed on your throat for comfort and to stop you from gagging or coughing.  A mouth guard may be placed in your mouth to protect your teeth and to keep you from biting on the endoscope.  You will be asked to lie on your left side.  The endoscope is inserted down your throat and into the esophagus, stomach, and duodenum.  Air is put through the endoscope to allow your caregiver to view the lining of your esophagus clearly.  The esophagus, stomach, and duodenum is then examined. During the exam, your caregiver may:  Remove tissue to be examined under a microscope (biopsy) for inflammation, infection, or other medical problems.  Remove growths.  Remove objects (foreign bodies) that are stuck.  Treat any bleeding with medicines or other devices that stop tissues from bleeding (hot cautery, clipping devices).  Widen (dilate) or stretch narrowed areas of the esophagus and stomach.  The endoscope will then be withdrawn. AFTER THE PROCEDURE  You will be taken to a recovery area to be monitored. You will be able to go home once you are stable and alert.  Do not eat or drink anything until the local anesthetic and numbing medicines have worn off. You may choke.  It is normal to feel bloated, have pain with swallowing, or have a sore throat for a short time. This will wear off.  Your caregiver should be able to discuss his or her findings with you. It will take longer to discuss the test results if any biopsies were taken. Document Released: 07/09/2004 Document Revised: 07/23/2013 Document Reviewed: 02/09/2012 [facility] Patient Information 2015 [place], [place]. This information is not  intended to replace advice given to you by your health care provider. Make sure you discuss any questions you have with your health care provider.  Patient has been scheduled for an upper endoscopy on 08-14-14 at [facility].

## 2014-08-15 ENCOUNTER — Ambulatory Visit (INDEPENDENT_AMBULATORY_CARE_PROVIDER_SITE_OTHER): Payer: 59 | Admitting: *Deleted

## 2014-08-15 DIAGNOSIS — M722 Plantar fascial fibromatosis: Secondary | ICD-10-CM

## 2014-08-15 DIAGNOSIS — M76821 Posterior tibial tendinitis, right leg: Secondary | ICD-10-CM

## 2014-08-15 NOTE — Progress Notes (Signed)
Orthotics dispensed. Instructions given. Prn 1 month, if needed.

## 2014-08-15 NOTE — Patient Instructions (Signed)

## 2014-08-20 ENCOUNTER — Ambulatory Visit
Admission: RE | Admit: 2014-08-20 | Discharge: 2014-08-20 | Disposition: A | Discharge status: Home / Self Care | Payer: 59 | Source: Ambulatory Visit | Attending: General Surgery | Admitting: General Surgery

## 2014-08-20 ENCOUNTER — Encounter: Payer: Self-pay | Admitting: Anesthesiology

## 2014-08-20 ENCOUNTER — Ambulatory Visit: Payer: 59 | Admitting: Anesthesiology

## 2014-08-20 ENCOUNTER — Encounter
Admission: RE | Disposition: A | Discharge status: Home / Self Care | Payer: Self-pay | Source: Ambulatory Visit | Attending: General Surgery

## 2014-08-20 DIAGNOSIS — K219 Gastro-esophageal reflux disease without esophagitis: Secondary | ICD-10-CM | POA: Diagnosis not present

## 2014-08-20 DIAGNOSIS — E119 Type 2 diabetes mellitus without complications: Secondary | ICD-10-CM | POA: Insufficient documentation

## 2014-08-20 DIAGNOSIS — M109 Gout, unspecified: Secondary | ICD-10-CM | POA: Diagnosis not present

## 2014-08-20 DIAGNOSIS — G4733 Obstructive sleep apnea (adult) (pediatric): Secondary | ICD-10-CM | POA: Diagnosis not present

## 2014-08-20 DIAGNOSIS — M1990 Unspecified osteoarthritis, unspecified site: Secondary | ICD-10-CM | POA: Insufficient documentation

## 2014-08-20 DIAGNOSIS — Z8249 Family history of ischemic heart disease and other diseases of the circulatory system: Secondary | ICD-10-CM | POA: Insufficient documentation

## 2014-08-20 DIAGNOSIS — Z79899 Other long term (current) drug therapy: Secondary | ICD-10-CM | POA: Insufficient documentation

## 2014-08-20 DIAGNOSIS — E782 Mixed hyperlipidemia: Secondary | ICD-10-CM | POA: Diagnosis not present

## 2014-08-20 DIAGNOSIS — G43909 Migraine, unspecified, not intractable, without status migrainosus: Secondary | ICD-10-CM | POA: Diagnosis not present

## 2014-08-20 DIAGNOSIS — Z886 Allergy status to analgesic agent status: Secondary | ICD-10-CM | POA: Insufficient documentation

## 2014-08-20 DIAGNOSIS — Z8489 Family history of other specified conditions: Secondary | ICD-10-CM | POA: Insufficient documentation

## 2014-08-20 DIAGNOSIS — R109 Unspecified abdominal pain: Secondary | ICD-10-CM | POA: Insufficient documentation

## 2014-08-20 DIAGNOSIS — R1310 Dysphagia, unspecified: Secondary | ICD-10-CM | POA: Diagnosis not present

## 2014-08-20 DIAGNOSIS — K229 Disease of esophagus, unspecified: Secondary | ICD-10-CM

## 2014-08-20 DIAGNOSIS — Z888 Allergy status to other drugs, medicaments and biological substances status: Secondary | ICD-10-CM | POA: Diagnosis not present

## 2014-08-20 DIAGNOSIS — J45909 Unspecified asthma, uncomplicated: Secondary | ICD-10-CM | POA: Diagnosis not present

## 2014-08-20 DIAGNOSIS — R002 Palpitations: Secondary | ICD-10-CM | POA: Diagnosis not present

## 2014-08-20 DIAGNOSIS — R05 Cough: Secondary | ICD-10-CM | POA: Insufficient documentation

## 2014-08-20 DIAGNOSIS — M791 Myalgia: Secondary | ICD-10-CM | POA: Diagnosis not present

## 2014-08-20 DIAGNOSIS — K429 Umbilical hernia without obstruction or gangrene: Secondary | ICD-10-CM | POA: Diagnosis not present

## 2014-08-20 DIAGNOSIS — K589 Irritable bowel syndrome without diarrhea: Secondary | ICD-10-CM | POA: Insufficient documentation

## 2014-08-20 DIAGNOSIS — E039 Hypothyroidism, unspecified: Secondary | ICD-10-CM | POA: Insufficient documentation

## 2014-08-20 DIAGNOSIS — Z7951 Long term (current) use of inhaled steroids: Secondary | ICD-10-CM | POA: Insufficient documentation

## 2014-08-20 DIAGNOSIS — I10 Essential (primary) hypertension: Secondary | ICD-10-CM | POA: Insufficient documentation

## 2014-08-20 DIAGNOSIS — B009 Herpesviral infection, unspecified: Secondary | ICD-10-CM | POA: Insufficient documentation

## 2014-08-20 HISTORY — PX: ESOPHAGOGASTRODUODENOSCOPY: SHX5428

## 2014-08-20 LAB — GLUCOSE, CAPILLARY: Glucose-Capillary: 99 mg/dL (ref 65–99)

## 2014-08-20 SURGERY — EGD (ESOPHAGOGASTRODUODENOSCOPY)
Anesthesia: General

## 2014-08-20 MED ORDER — LIDOCAINE HCL (PF) 1 % IJ SOLN
2.0000 mL | Freq: Once | INTRAMUSCULAR | Status: AC
  Administered 2014-08-20: 2 mL via INTRADERMAL

## 2014-08-20 MED ORDER — LIDOCAINE HCL (PF) 1 % IJ SOLN
INTRAMUSCULAR | Status: AC
Start: 2014-08-20 — End: 2014-08-20
  Administered 2014-08-20: 2 mL via INTRADERMAL
  Filled 2014-08-20: qty 2

## 2014-08-20 MED ORDER — MIDAZOLAM HCL 2 MG/2ML IJ SOLN
INTRAMUSCULAR | Status: DC | PRN
  Administered 2014-08-20: 2 mg via INTRAVENOUS

## 2014-08-20 MED ORDER — GLYCOPYRROLATE 0.2 MG/ML IJ SOLN
INTRAMUSCULAR | Status: DC | PRN
  Administered 2014-08-20: 0.1 mg via INTRAVENOUS

## 2014-08-20 MED ORDER — PROPOFOL INFUSION 10 MG/ML OPTIME
INTRAVENOUS | Status: DC | PRN
  Administered 2014-08-20 (×2): 120 ug/kg/min via INTRAVENOUS

## 2014-08-20 MED ORDER — LACTATED RINGERS IV SOLN
INTRAVENOUS | Status: DC
  Administered 2014-08-20: 13:00:00 via INTRAVENOUS

## 2014-08-20 MED ORDER — LIDOCAINE HCL (CARDIAC) 20 MG/ML IV SOLN
INTRAVENOUS | Status: DC | PRN
  Administered 2014-08-20: 50 mg via INTRAVENOUS

## 2014-08-20 MED ORDER — FENTANYL CITRATE (PF) 100 MCG/2ML IJ SOLN
INTRAMUSCULAR | Status: DC | PRN
  Administered 2014-08-20: 50 ug via INTRAVENOUS

## 2014-08-20 NOTE — H&P (View-Only) (Signed)
Patient ID: Sarah Gordon, female   DOB: [birth year], 64 y.o.   MRN: [id number]  Chief Complaint  Patient presents with  . Abdominal Pain    HPI Sarah Gordon is a 64 y.o. female here for problems with swallowing and abdominal bloating. She states that when she swallows food it feels as if it is getting stuck in her esophagus. She describes it as a large knot feeling. She states that later on she will have swelling and bloating and a feeling of heaviness.This has been ongoing for three weeks. She has a history of IBS but she feels that this is different. She reports a weight loss of about 8 pounds in the last 2 weeks. She reports that she can only get relief with the use of digestive supplements. She also reports a dry cough with hoarseness in the last 3 days. She recently finished a regimend of Prednisone about a week ago. HPI  Past Medical History  Diagnosis Date  . Obstructive sleep apnea (adult) (pediatric)   . Myalgia and myositis, unspecified   . Type II or unspecified type diabetes mellitus without mention of complication, not stated as uncontrolled   . Irritable bowel syndrome   . Unspecified hypothyroidism   . Mixed hyperlipidemia   . Unspecified essential hypertension   . Esophageal reflux   . Herpes simplex without mention of complication   . Localized osteoarthrosis not specified whether primary or secondary, unspecified site   . Migraine, unspecified, without mention of intractable migraine without mention of status migrainosus   . Gouty arthropathy, unspecified   . Umbilical hernia without mention of obstruction or gangrene   . Palpitations   . Asthma     Past Surgical History  Procedure Laterality Date  . Foot surgery Left     heel spur  . Nasal sinus surgery  1980s  . Umbilical hernia repair    . Hernia repair      umbilical  . Colonoscopy      Dr Alveta Heimlich    Family History  Problem Relation Age of Onset  . Hypertension Mother   . Hyperlipidemia Mother   . Heart  disease Brother 98    CABG x 3   . Heart attack Brother 39  . Lupus Sister     Social History History  Substance Use Topics  . Smoking status: Never Smoker   . Smokeless tobacco: Never Used  . Alcohol Use: No     Comment: RARELY    Allergies  Allergen Reactions  . Aspir-81 [Aspirin]     Coagulation disorder  . Fluzone [Flu Virus Vaccine]   . Topamax [Topiramate]   . Vascepa [Epa Ethyl Ester] Swelling  . Voltaren [Diclofenac Sodium]     Current Outpatient Prescriptions  Medication Sig Dispense Refill  . albuterol (PROVENTIL HFA;VENTOLIN HFA) 108 (90 BASE) MCG/ACT inhaler Inhale 2 puffs into the lungs every 4 (four) hours as needed for wheezing.    Marland Kitchen allopurinol (ZYLOPRIM) 300 MG tablet Take 300 mg by mouth daily.    Marland Kitchen atorvastatin (LIPITOR) 40 MG tablet Take 40 mg by mouth at bedtime.     . budesonide-formoterol (SYMBICORT) 160-4.5 MCG/ACT inhaler Inhale 2 puffs into the lungs 2 (two) times daily as needed.     . DHA-EPA-Vitamin E (OMEGA-3 COMPLEX PO) Take 2 capsules by mouth daily.    Marland Kitchen gabapentin (NEURONTIN) 100 MG capsule Take 100 mg by mouth 3 (three) times daily as needed.    Marland Kitchen losartan (COZAAR) 25 MG  tablet Take 5,000 mg by mouth 3 (three) times a week.     . metformin (FORTAMET) 500 MG (OSM) 24 hr tablet Take 500 mg by mouth daily with breakfast.    . NON FORMULARY Take 1 tablet by mouth 2 (two) times daily as needed. TriEase    . NON FORMULARY Take 2 capsules by mouth daily. MicroPlex Food Supplement    . NON FORMULARY Take 2 capsules by mouth daily. Alpha CRS +    . omeprazole (PRILOSEC) 20 MG capsule Take 20 mg by mouth daily.    . SUMAtriptan (IMITREX) 100 MG tablet Take 100 mg by mouth every 2 (two) hours as needed for migraine. May repeat in 2 hours if headache persists or recurs.    . valACYclovir (VALTREX) 1000 MG tablet Take 1,000 mg by mouth 2 (two) times daily as needed.    . cyclobenzaprine (FLEXERIL) 10 MG tablet Take 10 mg by mouth 2 (two) times daily as  needed.     Marland Kitchen levothyroxine (SYNTHROID, LEVOTHROID) 125 MCG tablet Take 125 mcg by mouth 6 days. Take 1/2 once a week.     No current facility-administered medications for this visit.    Review of Systems Review of Systems  Constitutional: Negative.   Respiratory: Positive for cough. Negative for apnea, choking, chest tightness, shortness of breath, wheezing and stridor.   Gastrointestinal: Positive for nausea (mild) and abdominal distention. Negative for vomiting, abdominal pain, diarrhea, constipation, blood in stool, anal bleeding and rectal pain.    Blood pressure 134/82, pulse 81, resp. rate 14, height 5\' 7"  (1.702 m), weight 229 lb (103.874 kg).  Physical Exam Physical Exam  Constitutional: She is oriented to person, place, and time. She appears well-developed and well-nourished.  Eyes: Conjunctivae are normal. No scleral icterus.  Neck: Neck supple.  Cardiovascular: Normal rate, regular rhythm and normal heart sounds.   Pulmonary/Chest: Effort normal and breath sounds normal.  Abdominal: Soft. Normal appearance and bowel sounds are normal. There is no splenomegaly or hepatomegaly. There is tenderness in the left upper quadrant.  Lymphadenopathy:    She has no cervical adenopathy.  Neurological: She is alert and oriented to person, place, and time.  Skin: Skin is warm and dry.    Data Reviewed    Assessment    Dysphagia. Pt has had reflux symptoms but controlled with omeprazole.      Plan   Endoscopy recommended. Procedure risks/benefits explained. Pt is agreeable. Patient has been scheduled for an upper endoscopy on 08-14-14 at [facility].     PCP: Dr Deon Pilling 08/07/2014, 10:52 AM

## 2014-08-20 NOTE — Interval H&P Note (Signed)
History and Physical Interval Note:  08/20/2014 12:50 PM  Sarah Gordon  has presented today for surgery, with the diagnosis of DYSPHAGIA  The various methods of treatment have been discussed with the patient and family. After consideration of risks, benefits and other options for treatment, the patient has consented to  Procedure(s): ESOPHAGOGASTRODUODENOSCOPY (EGD) (N/A) as a surgical intervention .  The patient's history has been reviewed, patient examined, no change in status, stable for surgery.  I have reviewed the patient's chart and labs.  Questions were answered to the patient's satisfaction.     SANKAR,SEEPLAPUTHUR G

## 2014-08-20 NOTE — Op Note (Signed)
[facility] Gastroenterology Patient Name: Sarah Gordon Procedure Date: 08/20/2014 12:45 PM MRN: [id number] Account #: [id number] Date of Birth: 1951-02-09 Admit Type: Outpatient Age: 64 Room: [facility] ENDO ROOM 1 Gender: Female Note Status: Finalized Procedure:         Upper GI endoscopy Indications:       Dysphagia Providers:         Orlie Pollen, MD Referring MD:      No Local Md, MD (Referring MD) Medicines:         General Anesthesia Complications:     No immediate complications. Procedure:         Pre-Anesthesia Assessment:                    - Using IV propofol under the supervision of an                     anesthesiologist was determined to be medically necessary                     for this procedure based on review of the patient's                     medical history, medications, and prior anesthesia history.                    After obtaining informed consent, the endoscope was passed                     under direct vision. Throughout the procedure, the                     patient's blood pressure, pulse, and oxygen saturations                     were monitored continuously. The Olympus GIF-160 endoscope                     (S#. [id number]) was introduced through the mouth, and                     advanced to the second part of duodenum. The upper GI                     endoscopy was accomplished without difficulty. The patient                     tolerated the procedure well. Findings:      The examined duodenum was normal.      The stomach was normal.      Localized mild mucosal abnormality characterized by nodularity was found       in the lower third of the esophagus. Biopsies were taken with a cold       forceps for histology.      The exam was otherwise without abnormality. Impression:        - Normal examined duodenum.                    - Normal stomach.                    - Nodular mucosa in the esophagus. Biopsied.  - The examination was otherwise normal. Recommendation:    - Await pathology results. Procedure Code(s): --- Professional ---  57017, Esophagogastroduodenoscopy, flexible, transoral;                     with biopsy, single or multiple Diagnosis Code(s): --- Professional ---                    K22.9, Disease of esophagus, unspecified                    R13.10, Dysphagia, unspecified CPT copyright 2014 American Medical Association. All rights reserved. The codes documented in this report are preliminary and upon coder review may  be revised to meet current compliance requirements. Orlie Pollen, MD 08/20/2014 1:38:16 PM This report has been signed electronically. Number of Addenda: 0 Note Initiated On: 08/20/2014 12:45 PM      [facility]

## 2014-08-20 NOTE — Anesthesia Postprocedure Evaluation (Signed)
  Anesthesia Post-op Note  Patient: Sarah Gordon  Procedure(s) Performed: Procedure(s): ESOPHAGOGASTRODUODENOSCOPY (EGD) (N/A)  Anesthesia type:General  Patient location: PACU  Post pain: Pain level controlled  Post assessment: Post-op Vital signs reviewed, Patient's Cardiovascular Status Stable, Respiratory Function Stable, Patent Airway and No signs of Nausea or vomiting  Post vital signs: Reviewed and stable  Last Vitals:  Filed Vitals:   08/20/14 1252  BP: 116/79  Pulse: 77  Temp: 35.8 C  Resp: 17    Level of consciousness: awake, alert  and patient cooperative  Complications: No apparent anesthesia complications

## 2014-08-20 NOTE — Anesthesia Preprocedure Evaluation (Signed)
Anesthesia Evaluation  Patient identified by MRN, date of birth, ID band Patient awake    Reviewed: Allergy & Precautions, NPO status , Patient's Chart, lab work & pertinent test results  Airway Mallampati: III  TM Distance: <3 FB Neck ROM: Full    Dental  (+) Caps,  Molar caps and one in the upper front:   Pulmonary asthma , sleep apnea and Continuous Positive Airway Pressure Ventilation ,  breath sounds clear to auscultation  Pulmonary exam normal       Cardiovascular hypertension, Rhythm:Regular Rate:Normal     Neuro/Psych  Headaches,  Neuromuscular disease negative psych ROS   GI/Hepatic Neg liver ROS, GERD-  ,  Endo/Other  diabetes, Well Controlled, Type 2Hypothyroidism   Renal/GU negative Renal ROS  negative genitourinary   Musculoskeletal  (+) Arthritis -, Osteoarthritis,    Abdominal (+) + obese,   Peds negative pediatric ROS (+)  Hematology negative hematology ROS (+)   Anesthesia Other Findings   Reproductive/Obstetrics                             Anesthesia Physical Anesthesia Plan  ASA: III  Anesthesia Plan: General   Post-op Pain Management:    Induction: Intravenous  Airway Management Planned: Nasal Cannula  Additional Equipment:   Intra-op Plan:   Post-operative Plan:   Informed Consent: I have reviewed the patients History and Physical, chart, labs and discussed the procedure including the risks, benefits and alternatives for the proposed anesthesia with the patient or authorized representative who has indicated his/her understanding and acceptance.   Dental advisory given  Plan Discussed with: CRNA and Surgeon  Anesthesia Plan Comments:         Anesthesia Quick Evaluation

## 2014-08-20 NOTE — Anesthesia Procedure Notes (Signed)
Performed by: Vaughan Sine Pre-anesthesia Checklist: Patient identified, Emergency Drugs available, Suction available, Patient being monitored and Timeout performed Patient Re-evaluated:Patient Re-evaluated prior to inductionOxygen Delivery Method: Nasal cannula Preoxygenation: Pre-oxygenation with 100% oxygen Airway Equipment and Method: Bite block Placement Confirmation: positive ETCO2

## 2014-08-20 NOTE — Transfer of Care (Signed)
Immediate Anesthesia Transfer of Care Note  Patient: Sarah Gordon  Procedure(s) Performed: Procedure(s): ESOPHAGOGASTRODUODENOSCOPY (EGD) (N/A)  Patient Location: PACU  Anesthesia Type:General  Level of Consciousness: awake and sedated  Airway & Oxygen Therapy: Patient Spontanous Breathing and Patient connected to nasal cannula oxygen  Post-op Assessment: Report given to RN and Post -op Vital signs reviewed and stable  Post vital signs: Reviewed and stable  Last Vitals:  Filed Vitals:   08/20/14 1252  BP: 116/79  Pulse: 77  Temp: 35.8 C  Resp: 17    Complications: No apparent anesthesia complications

## 2014-08-21 LAB — SURGICAL PATHOLOGY

## 2014-08-22 ENCOUNTER — Telehealth: Payer: Self-pay | Admitting: *Deleted

## 2014-08-22 ENCOUNTER — Encounter: Payer: Self-pay | Admitting: General Surgery

## 2014-08-22 NOTE — Telephone Encounter (Signed)
-----   Message from Christene Lye, MD sent at 08/22/2014 10:21 AM EDT ----- Please let pt pt know the pathology was normal.. Appt to see me for further management of her dysphagia

## 2014-08-28 ENCOUNTER — Telehealth: Payer: Self-pay | Admitting: *Deleted

## 2014-08-28 NOTE — Telephone Encounter (Signed)
Please let pt pt know the pathology was normal.. Appt to see me for further management of her dysphagia.

## 2014-08-28 NOTE — Telephone Encounter (Signed)
Patient is aware of pathology results. She is also aware to follow up for management of her dysphagia. She didn't want to make the appointment right now because she is going out of town. She stated that she will call back to schedule when she gets in from town.

## 2014-09-02 NOTE — Telephone Encounter (Signed)
Followed up with pt to make sure that she knew that her orthos were here and ready to be picked up . If pt needed to call gave her [phone] ext [phone] .

## 2014-09-04 ENCOUNTER — Encounter: Payer: Self-pay | Admitting: Family Medicine

## 2014-09-10 ENCOUNTER — Encounter: Payer: Self-pay | Admitting: Family Medicine

## 2014-09-10 ENCOUNTER — Ambulatory Visit (INDEPENDENT_AMBULATORY_CARE_PROVIDER_SITE_OTHER): Payer: 59 | Admitting: Family Medicine

## 2014-09-10 ENCOUNTER — Encounter (INDEPENDENT_AMBULATORY_CARE_PROVIDER_SITE_OTHER): Payer: Self-pay

## 2014-09-10 VITALS — BP 124/72 | HR 76 | Temp 98.4°F | Resp 16 | Ht 67.0 in | Wt 234.9 lb

## 2014-09-10 DIAGNOSIS — E119 Type 2 diabetes mellitus without complications: Secondary | ICD-10-CM

## 2014-09-10 DIAGNOSIS — IMO0001 Reserved for inherently not codable concepts without codable children: Secondary | ICD-10-CM

## 2014-09-10 DIAGNOSIS — I10 Essential (primary) hypertension: Secondary | ICD-10-CM

## 2014-09-10 DIAGNOSIS — M609 Myositis, unspecified: Secondary | ICD-10-CM | POA: Diagnosis not present

## 2014-09-10 DIAGNOSIS — G4733 Obstructive sleep apnea (adult) (pediatric): Secondary | ICD-10-CM | POA: Diagnosis not present

## 2014-09-10 DIAGNOSIS — E785 Hyperlipidemia, unspecified: Secondary | ICD-10-CM

## 2014-09-10 DIAGNOSIS — E1169 Type 2 diabetes mellitus with other specified complication: Secondary | ICD-10-CM

## 2014-09-10 DIAGNOSIS — M797 Fibromyalgia: Secondary | ICD-10-CM | POA: Insufficient documentation

## 2014-09-10 DIAGNOSIS — E039 Hypothyroidism, unspecified: Secondary | ICD-10-CM | POA: Diagnosis not present

## 2014-09-10 DIAGNOSIS — M791 Myalgia: Secondary | ICD-10-CM

## 2014-09-10 DIAGNOSIS — E669 Obesity, unspecified: Secondary | ICD-10-CM

## 2014-09-10 DIAGNOSIS — M1000 Idiopathic gout, unspecified site: Secondary | ICD-10-CM

## 2014-09-10 DIAGNOSIS — J4520 Mild intermittent asthma, uncomplicated: Secondary | ICD-10-CM | POA: Insufficient documentation

## 2014-09-10 DIAGNOSIS — E1140 Type 2 diabetes mellitus with diabetic neuropathy, unspecified: Secondary | ICD-10-CM | POA: Insufficient documentation

## 2014-09-10 LAB — POCT UA - MICROALBUMIN: Albumin/Creatinine Ratio, Urine, POC: 20

## 2014-09-10 LAB — POCT GLYCOSYLATED HEMOGLOBIN (HGB A1C): Hemoglobin A1C: 5.5

## 2014-09-10 LAB — GLUCOSE, POCT (MANUAL RESULT ENTRY): POC Glucose: 105 mg/dl — AB (ref 70–99)

## 2014-09-10 NOTE — Progress Notes (Signed)
Name: Sarah Gordon   MRN: [id number]    DOB: [birth year]   Date:09/10/2014       Progress Note  Subjective  Chief Complaint  Chief Complaint  Patient presents with  . Hypertension    3 month follow up  . Diabetes  . Hyperlipidemia    Hypertension This is a chronic problem. The current episode started more than 1 year ago. The problem has been gradually improving since onset. The problem is controlled. Pertinent negatives include no blurred vision, chest pain, headaches, neck pain, orthopnea, palpitations or shortness of breath. There are no associated agents to hypertension. Risk factors for coronary artery disease include diabetes mellitus, dyslipidemia, obesity and sedentary lifestyle. The current treatment provides moderate improvement. There are no compliance problems.   Diabetes She has type 2 diabetes mellitus. Her disease course has been improving. There are no hypoglycemic associated symptoms. Pertinent negatives for hypoglycemia include no dizziness, headaches, nervousness/anxiousness, seizures or tremors. Pertinent negatives for diabetes include no blurred vision, no chest pain, no weakness and no weight loss. There are no hypoglycemic complications. Symptoms are improving. There are no diabetic complications. Current diabetic treatment includes oral agent (monotherapy). She is compliant with treatment all of the time. Her weight is increasing steadily. She is following a generally healthy diet. She has not had a previous visit with a dietitian. She rarely participates in exercise. Her home blood glucose trend is decreasing steadily. Her breakfast blood glucose range is generally 90-110 mg/dl. Her highest blood glucose is 130-140 mg/dl. An ACE inhibitor/angiotensin II receptor blocker is being taken.  Hyperlipidemia This is a chronic problem. The current episode started more than 1 year ago. Pertinent negatives include no chest pain, focal weakness, myalgias or shortness of breath.  Current antihyperlipidemic treatment includes statins. There are no compliance problems.  Risk factors for coronary artery disease include diabetes mellitus, dyslipidemia, hypertension, obesity, post-menopausal, a sedentary lifestyle and stress.       Past Medical History  Diagnosis Date  . Obstructive sleep apnea (adult) (pediatric)   . Myalgia and myositis, unspecified   . Type II or unspecified type diabetes mellitus without mention of complication, not stated as uncontrolled   . Irritable bowel syndrome   . Unspecified hypothyroidism   . Mixed hyperlipidemia   . Unspecified essential hypertension   . Esophageal reflux   . Herpes simplex without mention of complication   . Localized osteoarthrosis not specified whether primary or secondary, unspecified site   . Migraine, unspecified, without mention of intractable migraine without mention of status migrainosus   . Gouty arthropathy, unspecified   . Umbilical hernia without mention of obstruction or gangrene   . Palpitations   . Asthma     History  Substance Use Topics  . Smoking status: Never Smoker   . Smokeless tobacco: Never Used  . Alcohol Use: No     Comment: RARELY     Current outpatient prescriptions:  .  allopurinol (ZYLOPRIM) 300 MG tablet, Take 300 mg by mouth daily., Disp: , Rfl:  .  atorvastatin (LIPITOR) 40 MG tablet, Take 40 mg by mouth at bedtime. , Disp: , Rfl:  .  cyclobenzaprine (FLEXERIL) 10 MG tablet, Take 10 mg by mouth 2 (two) times daily as needed for muscle spasms. , Disp: , Rfl:  .  gabapentin (NEURONTIN) 100 MG capsule, Take 100-200 mg by mouth 2 (two) times daily. One in the morning and two at night, Disp: , Rfl:  .  levothyroxine (SYNTHROID, LEVOTHROID) 125 MCG  tablet, Take 62.5-125 mcg by mouth daily. 1/2 tab on Wednesday, 1 tab the rest of the week, Disp: , Rfl:  .  losartan (COZAAR) 25 MG tablet, Take 25 mg by mouth daily. , Disp: , Rfl:  .  NON FORMULARY, Take 2 capsules by mouth daily.  MicroPlex Food Supplement, Disp: , Rfl:  .  NON FORMULARY, Take 2 capsules by mouth daily. Alpha CRS +, Disp: , Rfl:  .  omeprazole (PRILOSEC) 20 MG capsule, Take 20 mg by mouth daily., Disp: , Rfl:  .  OVER THE COUNTER MEDICATION, Take 1 capsule by mouth 2 (two) times daily. Deep blue polyphenol, Disp: , Rfl:  .  OVER THE COUNTER MEDICATION, Take 1 capsule by mouth 2 (two) times daily. DDR prime, Disp: , Rfl:  .  OVER THE COUNTER MEDICATION, Take 2 capsules by mouth every morning. XEO Mega, Disp: , Rfl:  .  OVER THE COUNTER MEDICATION, Take 1 capsule by mouth 2 (two) times daily. digestzen, Disp: , Rfl:  .  SUMAtriptan (IMITREX) 100 MG tablet, Take 100 mg by mouth every 2 (two) hours as needed for migraine. May repeat in 2 hours if headache persists or recurs., Disp: , Rfl:  .  budesonide-formoterol (SYMBICORT) 160-4.5 MCG/ACT inhaler, Inhale 2 puffs into the lungs 2 (two) times daily as needed. , Disp: , Rfl:  .  metformin (FORTAMET) 500 MG (OSM) 24 hr tablet, Take 500 mg by mouth every other day. , Disp: , Rfl:  .  valACYclovir (VALTREX) 1000 MG tablet, Take 1,000 mg by mouth 2 (two) times daily as needed (shingles). , Disp: , Rfl:   Allergies  Allergen Reactions  . Aspir-81 [Aspirin]     Coagulation disorder  . Fluzone [Flu Virus Vaccine]   . Topamax [Topiramate] Other (See Comments)    Cognitive issues   . Vascepa [Epa Ethyl Ester] Swelling  . Voltaren [Diclofenac Sodium] Hives    Review of Systems  Constitutional: Negative for fever, chills and weight loss.  HENT: Negative for congestion, hearing loss, sore throat and tinnitus.   Eyes: Negative for blurred vision, double vision and redness.  Respiratory: Negative for cough, hemoptysis and shortness of breath.   Cardiovascular: Negative for chest pain, palpitations, orthopnea, claudication and leg swelling.  Gastrointestinal: Negative for heartburn, nausea, vomiting, diarrhea, constipation and blood in stool.  Genitourinary:  Negative for dysuria, urgency, frequency and hematuria.  Musculoskeletal: Negative for myalgias, back pain, joint pain, falls and neck pain.  Skin: Negative for itching.  Neurological: Negative for dizziness, tingling, tremors, focal weakness, seizures, loss of consciousness, weakness and headaches.  Endo/Heme/Allergies: Does not bruise/bleed easily.  Psychiatric/Behavioral: Negative for depression and substance abuse. The patient is not nervous/anxious and does not have insomnia.      Objective  Filed Vitals:   09/10/14 1054  BP: 124/72  Pulse: 76  Temp: 98.4 F (36.9 C)  TempSrc: Oral  Resp: 16  Height: 5\' 7"  (1.702 m)  Weight: 234 lb 14.4 oz (106.55 kg)  SpO2: 97%     Physical Exam  Constitutional: She is oriented to person, place, and time and well-developed, well-nourished, and in no distress.  HENT:  Head: Normocephalic.  Eyes: EOM are normal. Pupils are equal, round, and reactive to light.  Neck: Normal range of motion. No thyromegaly present.  Cardiovascular: Normal rate, regular rhythm and normal heart sounds.   No murmur heard. Pulmonary/Chest: Effort normal and breath sounds normal.  Abdominal: Soft. Bowel sounds are normal.  Musculoskeletal: Normal range of  motion. She exhibits no edema.  Neurological: She is alert and oriented to person, place, and time. No cranial nerve deficit. Gait normal.  Skin: Skin is warm and dry. No rash noted.  Psychiatric: Memory and affect normal.      Assessment & Plan  1. Type 2 diabetes mellitus without complication Well controlled.  Hold Metformin - POCT HgB A1C - POCT Glucose (CBG) - POCT UA - Microalbumin  2. Essential hypertension Well controlled  - Comprehensive metabolic panel  3. Obstructive sleep apnea stable  4. Diabetes mellitus type 2 in obese Well controlled  5. Hypothyroidism, unspecified hypothyroidism type tsh - TSH  6. Myalgia and myositis Continue current regimen  7. Hyperlipidemia  -  Lipid panel  8. Idiopathic gout, unspecified chronicity, unspecified site NO RECENT ATTACKS - Uric acid

## 2014-09-10 NOTE — Patient Instructions (Signed)
F/U 4 MO 

## 2014-09-12 LAB — COMPREHENSIVE METABOLIC PANEL
ALT: 21 IU/L (ref 0–32)
AST: 26 IU/L (ref 0–40)
Albumin/Globulin Ratio: 2.1 (ref 1.1–2.5)
Albumin: 3.9 g/dL (ref 3.6–4.8)
Alkaline Phosphatase: 72 IU/L (ref 39–117)
BUN/Creatinine Ratio: 29 — ABNORMAL HIGH (ref 11–26)
BUN: 21 mg/dL (ref 8–27)
Bilirubin Total: 0.3 mg/dL (ref 0.0–1.2)
CO2: 22 mmol/L (ref 18–29)
Calcium: 8.9 mg/dL (ref 8.7–10.3)
Chloride: 106 mmol/L (ref 97–108)
Creatinine, Ser: 0.72 mg/dL (ref 0.57–1.00)
GFR calc Af Amer: 105 mL/min/{1.73_m2} (ref >59)
GFR calc non Af Amer: 91 mL/min/{1.73_m2} (ref >59)
Globulin, Total: 1.9 g/dL (ref 1.5–4.5)
Glucose: 89 mg/dL (ref 65–99)
Potassium: 4.4 mmol/L (ref 3.5–5.2)
Sodium: 144 mmol/L (ref 134–144)
Total Protein: 5.8 g/dL — ABNORMAL LOW (ref 6.0–8.5)

## 2014-09-12 LAB — LIPID PANEL
Chol/HDL Ratio: 3.6 ratio units (ref 0.0–4.4)
Cholesterol, Total: 161 mg/dL (ref 100–199)
HDL: 45 mg/dL (ref >39)
LDL Calculated: 100 mg/dL — ABNORMAL HIGH (ref 0–99)
Triglycerides: 80 mg/dL (ref 0–149)
VLDL Cholesterol Cal: 16 mg/dL (ref 5–40)

## 2014-09-12 LAB — TSH: TSH: 1.92 u[IU]/mL (ref 0.450–4.500)

## 2014-09-12 LAB — URIC ACID: Uric Acid: 4.1 mg/dL (ref 2.5–7.1)

## 2014-09-17 ENCOUNTER — Ambulatory Visit: Payer: 59 | Admitting: Podiatry

## 2014-09-19 ENCOUNTER — Other Ambulatory Visit: Payer: Self-pay | Admitting: Family Medicine

## 2014-10-15 ENCOUNTER — Encounter: Payer: Self-pay | Admitting: Family Medicine

## 2014-10-15 ENCOUNTER — Ambulatory Visit (INDEPENDENT_AMBULATORY_CARE_PROVIDER_SITE_OTHER): Payer: 59 | Admitting: Family Medicine

## 2014-10-15 VITALS — BP 128/82 | HR 88 | Temp 98.1°F | Resp 16 | Ht 67.0 in | Wt 230.0 lb

## 2014-10-15 DIAGNOSIS — Z0000 Encounter for general adult medical examination without abnormal findings: Secondary | ICD-10-CM | POA: Diagnosis not present

## 2014-10-15 NOTE — Progress Notes (Signed)
Name: Sarah Gordon   MRN: [id number]    DOB: 1950-06-22   Date:10/15/2014       Progress Note  Subjective  Chief Complaint  Chief Complaint  Patient presents with  . Annual Exam    HPI  Patient presents for annual H&P. There is a 90  Past Medical History  Diagnosis Date  . Obstructive sleep apnea (adult) (pediatric)   . Myalgia and myositis, unspecified   . Type II or unspecified type diabetes mellitus without mention of complication, not stated as uncontrolled   . Irritable bowel syndrome   . Unspecified hypothyroidism   . Mixed hyperlipidemia   . Unspecified essential hypertension   . Esophageal reflux   . Herpes simplex without mention of complication   . Localized osteoarthrosis not specified whether primary or secondary, unspecified site   . Migraine, unspecified, without mention of intractable migraine without mention of status migrainosus   . Gouty arthropathy, unspecified   . Umbilical hernia without mention of obstruction or gangrene   . Palpitations   . Asthma     History  Substance Use Topics  . Smoking status: Never Smoker   . Smokeless tobacco: Never Used  . Alcohol Use: No     Comment: RARELY     Current outpatient prescriptions:  .  allopurinol (ZYLOPRIM) 300 MG tablet, Take 300 mg by mouth daily., Disp: , Rfl:  .  atorvastatin (LIPITOR) 40 MG tablet, Take 1 tablet by mouth  daily, Disp: 90 tablet, Rfl: 1 .  cyclobenzaprine (FLEXERIL) 10 MG tablet, Take 10 mg by mouth 2 (two) times daily as needed for muscle spasms. , Disp: , Rfl:  .  gabapentin (NEURONTIN) 100 MG capsule, Take 100-200 mg by mouth 2 (two) times daily. One in the morning and two at night, Disp: , Rfl:  .  levothyroxine (SYNTHROID, LEVOTHROID) 125 MCG tablet, Take 1 tablet by mouth  daily, Disp: 90 tablet, Rfl: 1 .  NON FORMULARY, Take 2 capsules by mouth daily. MicroPlex Food Supplement, Disp: , Rfl:  .  NON FORMULARY, Take 2 capsules by mouth daily. Alpha CRS +, Disp: , Rfl:  .   omeprazole (PRILOSEC) 20 MG capsule, Take 1 capsule by mouth  daily, Disp: 90 capsule, Rfl: 1 .  OVER THE COUNTER MEDICATION, Take 1 capsule by mouth 2 (two) times daily. Deep blue polyphenol, Disp: , Rfl:  .  OVER THE COUNTER MEDICATION, Take 1 capsule by mouth 2 (two) times daily. DDR prime, Disp: , Rfl:  .  OVER THE COUNTER MEDICATION, Take 2 capsules by mouth every morning. XEO Mega, Disp: , Rfl:  .  OVER THE COUNTER MEDICATION, Take 1 capsule by mouth 2 (two) times daily. digestzen, Disp: , Rfl:  .  SUMAtriptan (IMITREX) 100 MG tablet, Take 100 mg by mouth every 2 (two) hours as needed for migraine. May repeat in 2 hours if headache persists or recurs., Disp: , Rfl:  .  valACYclovir (VALTREX) 1000 MG tablet, Take 1,000 mg by mouth 2 (two) times daily as needed (shingles). , Disp: , Rfl:   Allergies  Allergen Reactions  . Aspir-81 [Aspirin]     Coagulation disorder  . Fluzone [Flu Virus Vaccine]   . Topamax [Topiramate] Other (See Comments)    Cognitive issues   . Vascepa [Epa Ethyl Ester] Swelling  . Voltaren [Diclofenac Sodium] Hives    Review of Systems  Constitutional: Positive for weight loss. Negative for fever and chills.  HENT: Negative for congestion, hearing loss, sore  throat and tinnitus.   Eyes: Negative for blurred vision, double vision and redness.  Respiratory: Negative for cough, hemoptysis, sputum production, shortness of breath and wheezing.   Cardiovascular: Negative for chest pain, palpitations, orthopnea, claudication and leg swelling.  Gastrointestinal: Positive for heartburn. Negative for nausea, vomiting, diarrhea, constipation and blood in stool.  Genitourinary: Negative for dysuria, urgency, frequency and hematuria.  Musculoskeletal: Positive for back pain and joint pain. Negative for myalgias, falls and neck pain.  Skin: Negative for itching.  Neurological: Positive for headaches. Negative for dizziness, tingling, tremors, focal weakness, seizures, loss of  consciousness and weakness.  Endo/Heme/Allergies: Does not bruise/bleed easily.  Psychiatric/Behavioral: Negative for depression and substance abuse. The patient is not nervous/anxious and does not have insomnia.      Objective  Filed Vitals:   10/15/14 1035  BP: 128/82  Pulse: 88  Temp: 98.1 F (36.7 C)  Resp: 16  Height: 5\' 7"  (1.702 m)  Weight: 230 lb (104.327 kg)  SpO2: 97%     Physical Exam  Constitutional: She is oriented to person, place, and time and well-developed, well-nourished, and in no distress.  Obese  HENT:  Head: Normocephalic.  Eyes: EOM are normal. Pupils are equal, round, and reactive to light.  Neck: Normal range of motion. No thyromegaly present.  Cardiovascular: Normal rate, regular rhythm and normal heart sounds.   No murmur heard. Pulmonary/Chest: Effort normal and breath sounds normal.  Abdominal: Soft. Bowel sounds are normal.  Genitourinary: Vagina normal, uterus normal, cervix normal, right adnexa normal and left adnexa normal. Guaiac negative stool. No vaginal discharge found.  Musculoskeletal: Normal range of motion. She exhibits edema.  Neurological: She is alert and oriented to person, place, and time. No cranial nerve deficit. Gait normal.  Skin: Skin is warm and dry. No rash noted.  Psychiatric: Mood, memory, affect and judgment normal.      Assessment & Plan  1. Annual physical exam  - MM Digital Diagnostic Bilat; Future - DG Bone Density; Future - Cytology - non gyn

## 2014-10-16 ENCOUNTER — Encounter: Payer: Self-pay | Admitting: Family Medicine

## 2014-10-16 DIAGNOSIS — Z0000 Encounter for general adult medical examination without abnormal findings: Secondary | ICD-10-CM | POA: Insufficient documentation

## 2014-10-16 LAB — CYTOLOGY - NON PAP

## 2014-10-16 NOTE — Patient Instructions (Signed)
Praise for her weight loss and is to continue her daily on diet and exercise more

## 2014-10-17 ENCOUNTER — Other Ambulatory Visit: Payer: Self-pay | Admitting: Family Medicine

## 2014-11-06 ENCOUNTER — Telehealth: Payer: Self-pay | Admitting: Family Medicine

## 2014-11-06 NOTE — Telephone Encounter (Signed)
Pt is checking status on her referral for mammogram and bone density. I see the order i just cannot tell if the appointment has been made. Please return call.

## 2014-11-06 NOTE — Telephone Encounter (Signed)
I can not tell either so I sent it to [facility]

## 2014-11-06 NOTE — Telephone Encounter (Signed)
Notified patient both are scheduled on 11-20-14 at 12:40pm

## 2014-11-20 ENCOUNTER — Ambulatory Visit
Admission: RE | Admit: 2014-11-20 | Discharge: 2014-11-20 | Disposition: A | Discharge status: Home / Self Care | Payer: 59 | Source: Ambulatory Visit | Attending: Family Medicine | Admitting: Family Medicine

## 2014-11-20 DIAGNOSIS — Z780 Asymptomatic menopausal state: Secondary | ICD-10-CM | POA: Insufficient documentation

## 2014-11-20 DIAGNOSIS — Z0000 Encounter for general adult medical examination without abnormal findings: Secondary | ICD-10-CM

## 2014-11-20 DIAGNOSIS — Z1231 Encounter for screening mammogram for malignant neoplasm of breast: Secondary | ICD-10-CM | POA: Insufficient documentation

## 2014-11-21 ENCOUNTER — Telehealth: Payer: Self-pay | Admitting: Emergency Medicine

## 2014-11-21 NOTE — Telephone Encounter (Signed)
Patient notified

## 2014-12-06 ENCOUNTER — Other Ambulatory Visit (INDEPENDENT_AMBULATORY_CARE_PROVIDER_SITE_OTHER): Payer: 59

## 2014-12-06 DIAGNOSIS — Z0000 Encounter for general adult medical examination without abnormal findings: Secondary | ICD-10-CM

## 2014-12-06 LAB — POC HEMOCCULT BLD/STL (HOME/3-CARD/SCREEN)
Card #2 Fecal Occult Blod, POC: NEGATIVE
Card #3 Fecal Occult Blood, POC: NEGATIVE
Fecal Occult Blood, POC: NEGATIVE

## 2015-01-08 ENCOUNTER — Telehealth: Payer: Self-pay | Admitting: Family Medicine

## 2015-01-08 NOTE — Telephone Encounter (Signed)
Patient has appointment for Monday 01-13-15 and is requesting to do a cholesterol screening before her appointment.

## 2015-01-13 ENCOUNTER — Ambulatory Visit (INDEPENDENT_AMBULATORY_CARE_PROVIDER_SITE_OTHER): Payer: 59 | Admitting: Family Medicine

## 2015-01-13 ENCOUNTER — Encounter: Payer: Self-pay | Admitting: Family Medicine

## 2015-01-13 VITALS — BP 128/88 | HR 83 | Temp 98.0°F | Resp 16 | Ht 67.0 in | Wt 236.1 lb

## 2015-01-13 DIAGNOSIS — I10 Essential (primary) hypertension: Secondary | ICD-10-CM

## 2015-01-13 DIAGNOSIS — G4733 Obstructive sleep apnea (adult) (pediatric): Secondary | ICD-10-CM | POA: Diagnosis not present

## 2015-01-13 DIAGNOSIS — E039 Hypothyroidism, unspecified: Secondary | ICD-10-CM | POA: Diagnosis not present

## 2015-01-13 DIAGNOSIS — J4520 Mild intermittent asthma, uncomplicated: Secondary | ICD-10-CM

## 2015-01-13 DIAGNOSIS — E1143 Type 2 diabetes mellitus with diabetic autonomic (poly)neuropathy: Secondary | ICD-10-CM | POA: Diagnosis not present

## 2015-01-13 DIAGNOSIS — E1169 Type 2 diabetes mellitus with other specified complication: Secondary | ICD-10-CM | POA: Diagnosis not present

## 2015-01-13 LAB — GLUCOSE, POCT (MANUAL RESULT ENTRY): POC Glucose: 105 mg/dl — AB (ref 70–99)

## 2015-01-13 LAB — POCT GLYCOSYLATED HEMOGLOBIN (HGB A1C): Hemoglobin A1C: 5.4

## 2015-01-13 NOTE — Progress Notes (Signed)
Name: Sarah Gordon   MRN: [id number]    DOB: June 04, 1950   Date:01/13/2015       Progress Note  Subjective  Chief Complaint  No chief complaint on file.   HPI   Past Medical History  Diagnosis Date  . Obstructive sleep apnea (adult) (pediatric)   . Myalgia and myositis, unspecified   . Type II or unspecified type diabetes mellitus without mention of complication, not stated as uncontrolled   . Irritable bowel syndrome   . Unspecified hypothyroidism   . Mixed hyperlipidemia   . Unspecified essential hypertension   . Esophageal reflux   . Herpes simplex without mention of complication   . Localized osteoarthrosis not specified whether primary or secondary, unspecified site   . Migraine, unspecified, without mention of intractable migraine without mention of status migrainosus   . Gouty arthropathy, unspecified   . Umbilical hernia without mention of obstruction or gangrene   . Palpitations   . Asthma     Social History  Substance Use Topics  . Smoking status: Never Smoker   . Smokeless tobacco: Never Used  . Alcohol Use: No     Comment: RARELY     Current outpatient prescriptions:  .  levothyroxine (SYNTHROID, LEVOTHROID) 125 MCG tablet, Take 1 tablet by mouth  daily, Disp: 90 tablet, Rfl: 1 .  NON FORMULARY, Take 2 capsules by mouth daily. MicroPlex Food Supplement, Disp: , Rfl:  .  OVER THE COUNTER MEDICATION, Take 1 capsule by mouth 2 (two) times daily. Deep blue polyphenol, Disp: , Rfl:  .  allopurinol (ZYLOPRIM) 300 MG tablet, Take 300 mg by mouth daily., Disp: , Rfl:  .  atorvastatin (LIPITOR) 40 MG tablet, Take 1 tablet by mouth  daily (Patient not taking: Reported on 01/13/2015), Disp: 90 tablet, Rfl: 1 .  cyclobenzaprine (FLEXERIL) 10 MG tablet, Take 10 mg by mouth 2 (two) times daily as needed for muscle spasms. , Disp: , Rfl:  .  gabapentin (NEURONTIN) 100 MG capsule, Take 1 capsule by mouth 3  times a day as needed (Patient not taking: Reported on 01/13/2015),  Disp: 270 capsule, Rfl: 3 .  NON FORMULARY, Take 2 capsules by mouth daily. Alpha CRS +, Disp: , Rfl:  .  omeprazole (PRILOSEC) 20 MG capsule, Take 1 capsule by mouth  daily, Disp: 90 capsule, Rfl: 1 .  OVER THE COUNTER MEDICATION, Take 1 capsule by mouth 2 (two) times daily. DDR prime, Disp: , Rfl:  .  OVER THE COUNTER MEDICATION, Take 2 capsules by mouth every morning. XEO Mega, Disp: , Rfl:  .  OVER THE COUNTER MEDICATION, Take 1 capsule by mouth 2 (two) times daily. digestzen, Disp: , Rfl:  .  SUMAtriptan (IMITREX) 100 MG tablet, Take 100 mg by mouth every 2 (two) hours as needed for migraine. May repeat in 2 hours if headache persists or recurs., Disp: , Rfl:  .  valACYclovir (VALTREX) 1000 MG tablet, Take 1,000 mg by mouth 2 (two) times daily as needed (shingles). , Disp: , Rfl:   Allergies  Allergen Reactions  . Aspir-81 [Aspirin]     Coagulation disorder  . Fluzone [Flu Virus Vaccine]   . Topamax [Topiramate] Other (See Comments)    Cognitive issues   . Vascepa [Epa Ethyl Ester] Swelling  . Voltaren [Diclofenac Sodium] Hives    Review of Systems  Constitutional: Negative for fever, chills and weight loss.  HENT: Negative for congestion, hearing loss, sore throat and tinnitus.   Eyes: Negative for  blurred vision, double vision and redness.  Respiratory: Negative for cough, hemoptysis and shortness of breath.   Cardiovascular: Negative for chest pain, palpitations, orthopnea, claudication and leg swelling.  Gastrointestinal: Negative for heartburn, nausea, vomiting, diarrhea, constipation and blood in stool.  Genitourinary: Negative for dysuria, urgency, frequency and hematuria.  Musculoskeletal: Positive for joint pain. Negative for myalgias, back pain, falls and neck pain.  Skin: Negative for itching.  Neurological: Negative for dizziness, tingling, tremors, focal weakness, seizures, loss of consciousness, weakness and headaches.  Endo/Heme/Allergies: Does not bruise/bleed  easily.  Psychiatric/Behavioral: Negative for depression and substance abuse. The patient is not nervous/anxious and does not have insomnia.      Objective  Filed Vitals:   01/13/15 0901  BP: 128/88  Pulse: 83  Temp: 98 F (36.7 C)  Resp: 16  Height: 5\' 7"  (1.702 m)  Weight: 236 lb 1 oz (107.077 kg)  SpO2: 97%     Physical Exam  Constitutional: She is oriented to person, place, and time.  Obese female in no acute distress  HENT:  Head: Normocephalic.  Eyes: EOM are normal. Pupils are equal, round, and reactive to light.  Neck: Normal range of motion. No thyromegaly present.  Cardiovascular: Normal rate, regular rhythm, normal heart sounds and intact distal pulses.   No murmur heard. Pulmonary/Chest: Effort normal and breath sounds normal.  Abdominal: Soft. Bowel sounds are normal.  Musculoskeletal: Normal range of motion. She exhibits no edema.  Neurological: She is alert and oriented to person, place, and time. No cranial nerve deficit. Gait normal.  Skin: Skin is warm and dry. No rash noted.  Psychiatric: Memory and affect normal.   HPI  Diabetes  Patient presents for follow-up of diabetes which is present for over 5 years. Is currently on a regimen of  Paleo diet and exercise. Diet and exercise generally compliant withwith their diet and exercise. There's been no hypoglycemic episodes and theknown polyuria polydipsia polyphagia. His average fasting glucoses been in the low around - -  with a high around - . There is no end organ disease.  Last diabetic eye exam wasthe last 2 years.   Last visit with dietitian wasseveral years ago. Last microalbumin was obt20 February 15 .   Hypertension   Patient presents for follow-up of hypertension. It has been present for ove5 years.  Patient states that there is compliance with medical regimen which consists of of diet and exercise . There is no end organ disease. Cardiac risk factors include hypertension hyperlipidemia and  diabetes.  Exercise regimen consist of of walking .  Diet consist of Paleo .  Hyperlipidemia  Patient has a history of hyperlipidemia forover 5 . years  Current medical regimen consist of  nothing as she has stopped her atorvastatin not .  Compliance is  not .  Diet and exercise are currently followed  intermittently .  Risk factors for cardiovascular disease include hyperlipidemia  hypertension and obesity .   There have been no side effects from the medic   ASSESSEMENT AND PLAN  1. Type 2 diabetes mellitus with other specified complication (HCC) Well-controlled with diet and exercise - POCT Glucose (CBG) - POCT HgB A1C  2. Essential hypertension Well-controlled  3. Obstructive sleep apnea Continue CPAP  4. Airway hyperreactivity, mild intermittent, uncomplicated She is using a herbal supplement with good results  5. Hypothyroidism, unspecified hypothyroidism type TSH normal last visi  6. Diabetic autonomic neuropathy associated with type 2 diabetes mellitus (HCC) Continue NeurontiN

## 2015-01-14 NOTE — Telephone Encounter (Signed)
Patient has paperwork and came for CPE

## 2015-01-22 ENCOUNTER — Telehealth: Payer: Self-pay | Admitting: Family Medicine

## 2015-01-22 NOTE — Telephone Encounter (Signed)
Patient states that Atorvastatin is causing hop pain and stomach issues. She would like to try something different. [facility]

## 2015-01-27 ENCOUNTER — Encounter: Payer: Self-pay | Admitting: Family Medicine

## 2015-01-27 ENCOUNTER — Ambulatory Visit (INDEPENDENT_AMBULATORY_CARE_PROVIDER_SITE_OTHER): Payer: 59 | Admitting: Family Medicine

## 2015-01-27 VITALS — BP 126/81 | HR 88 | Temp 98.9°F | Resp 19 | Ht 67.0 in | Wt 239.6 lb

## 2015-01-27 DIAGNOSIS — J0110 Acute frontal sinusitis, unspecified: Secondary | ICD-10-CM | POA: Insufficient documentation

## 2015-01-27 MED ORDER — HYDROCOD POLST-CPM POLST ER 10-8 MG/5ML PO SUER: 5.0000 mL | Freq: Two times a day (BID) | ORAL | Status: DC | PRN

## 2015-01-27 MED ORDER — AZITHROMYCIN 250 MG PO TABS: ORAL_TABLET | ORAL | Status: DC

## 2015-01-27 NOTE — Progress Notes (Signed)
Name: Sarah Gordon   MRN: [id number]    DOB: [birth year]   Date:01/27/2015       Progress Note  Subjective  Chief Complaint  Chief Complaint  Patient presents with  . Acute Visit    Possi Bronchitis / coughing x3 days  . Diabetes   Cough This is a new problem. The current episode started in the past 7 days. The cough is productive of sputum. Associated symptoms include chest pain, nasal congestion, a sore throat and shortness of breath. Pertinent negatives include no chills or fever. She has tried prescription cough suppressant (Pt. has recived Tussionex for proir episodes of bronchitis and used some last night) for the symptoms. Her past medical history is significant for bronchitis. There is no history of asthma.    Past Medical History  Diagnosis Date  . Obstructive sleep apnea (adult) (pediatric)   . Myalgia and myositis, unspecified   . Type II or unspecified type diabetes mellitus without mention of complication, not stated as uncontrolled   . Irritable bowel syndrome   . Unspecified hypothyroidism   . Mixed hyperlipidemia   . Unspecified essential hypertension   . Esophageal reflux   . Herpes simplex without mention of complication   . Localized osteoarthrosis not specified whether primary or secondary, unspecified site   . Migraine, unspecified, without mention of intractable migraine without mention of status migrainosus   . Gouty arthropathy, unspecified   . Umbilical hernia without mention of obstruction or gangrene   . Palpitations   . Asthma     Past Surgical History  Procedure Laterality Date  . Foot surgery Left     heel spur  . Nasal sinus surgery  1980s  . Umbilical hernia repair    . Hernia repair      umbilical  . Colonoscopy      Dr Thurmond Butts  . Esophagogastroduodenoscopy N/A 08/20/2014    Procedure: ESOPHAGOGASTRODUODENOSCOPY (EGD);  Surgeon: Kieth Brightly, MD;  Location: [facility] ENDOSCOPY;  Service: Endoscopy;  Laterality: N/A;  . Breast biopsy Left      benign    Family History  Problem Relation Age of Onset  . Hypertension Mother   . Hyperlipidemia Mother   . Heart disease Brother 46    CABG x 3   . Heart attack Brother 55  . Lupus Sister     Social History   Social History  . Marital Status: Married    Spouse Name: N/A  . Number of Children: N/A  . Years of Education: N/A   Occupational History  . Not on file.   Social History Main Topics  . Smoking status: Never Smoker   . Smokeless tobacco: Never Used  . Alcohol Use: No     Comment: RARELY  . Drug Use: No  . Sexual Activity: No   Other Topics Concern  . Not on file   Social History Narrative     Current outpatient prescriptions:  .  cyclobenzaprine (FLEXERIL) 10 MG tablet, Take 10 mg by mouth 2 (two) times daily as needed for muscle spasms. , Disp: , Rfl:  .  gabapentin (NEURONTIN) 100 MG capsule, Take 1 capsule by mouth 3  times a day as needed, Disp: 270 capsule, Rfl: 3 .  levothyroxine (SYNTHROID, LEVOTHROID) 125 MCG tablet, Take 1 tablet by mouth  daily, Disp: 90 tablet, Rfl: 1 .  NON FORMULARY, Take 2 capsules by mouth daily. MicroPlex Food Supplement, Disp: , Rfl:  .  NON FORMULARY, Take 2 capsules  by mouth daily. Alpha CRS +, Disp: , Rfl:  .  omeprazole (PRILOSEC) 20 MG capsule, Take 1 capsule by mouth  daily, Disp: 90 capsule, Rfl: 1 .  OVER THE COUNTER MEDICATION, Take 1 capsule by mouth 2 (two) times daily. Deep blue polyphenol, Disp: , Rfl:  .  OVER THE COUNTER MEDICATION, Take 1 capsule by mouth 2 (two) times daily. DDR prime, Disp: , Rfl:  .  OVER THE COUNTER MEDICATION, Take 2 capsules by mouth every morning. XEO Mega, Disp: , Rfl:  .  OVER THE COUNTER MEDICATION, Take 1 capsule by mouth 2 (two) times daily. digestzen, Disp: , Rfl:  .  SUMAtriptan (IMITREX) 100 MG tablet, Take 100 mg by mouth every 2 (two) hours as needed for migraine. May repeat in 2 hours if headache persists or recurs., Disp: , Rfl:  .  valACYclovir (VALTREX) 1000 MG  tablet, Take 1,000 mg by mouth 2 (two) times daily as needed (shingles). , Disp: , Rfl:  .  allopurinol (ZYLOPRIM) 300 MG tablet, Take 300 mg by mouth daily., Disp: , Rfl:  .  atorvastatin (LIPITOR) 40 MG tablet, Take 1 tablet by mouth  daily (Patient not taking: Reported on 01/13/2015), Disp: 90 tablet, Rfl: 1  Allergies  Allergen Reactions  . Aspir-81 [Aspirin]     Coagulation disorder  . Fluzone [Flu Virus Vaccine]   . Topamax [Topiramate] Other (See Comments)    Cognitive issues   . Vascepa [Epa Ethyl Ester] Swelling  . Voltaren [Diclofenac Sodium] Hives     Review of Systems  Constitutional: Negative for fever and chills.  HENT: Positive for sore throat.   Respiratory: Positive for cough and shortness of breath.   Cardiovascular: Positive for chest pain.     Objective  Filed Vitals:   01/27/15 1559  BP: 126/81  Pulse: 88  Temp: 98.9 F (37.2 C)  TempSrc: Oral  Resp: 19  Height: 5\' 7"  (1.702 m)  Weight: 239 lb 9.6 oz (108.682 kg)  SpO2: 95%    Physical Exam  Constitutional: She is well-developed, well-nourished, and in no distress.  HENT:  Right Ear: Tympanic membrane and ear canal normal.  Left Ear: Tympanic membrane and ear canal normal.  Nose: Right sinus exhibits no maxillary sinus tenderness and no frontal sinus tenderness. Left sinus exhibits maxillary sinus tenderness and frontal sinus tenderness.  Mouth/Throat: No oropharyngeal exudate, posterior oropharyngeal edema or posterior oropharyngeal erythema.  Cardiovascular: Normal rate, regular rhythm and normal heart sounds.   No murmur heard. Pulmonary/Chest: Effort normal and breath sounds normal. She has no wheezes. She has no rhonchi.  Nursing note and vitals reviewed.   Assessment & Plan  1. Acute non-recurrent frontal sinusitis Persistent symptoms. We'll start on antibiotic for acute sinusitis. Refill for Tussionex provided for cough. - chlorpheniramine-HYDROcodone (TUSSIONEX PENNKINETIC ER) 10-8  MG/5ML SUER; Take 5 mLs by mouth every 12 (twelve) hours as needed for cough.  Dispense: 70 mL; Refill: 0 - azithromycin (ZITHROMAX Z-PAK) 250 MG tablet; 2 tabs po x day , then 1 tab po q day x 4 days  Dispense: 6 each; Refill: 0   Jawuan Robb Asad A[facility] 01/27/2015 4:08 PM

## 2015-01-30 NOTE — Telephone Encounter (Signed)
Crestor 20 mg daily at bedtime

## 2015-01-30 NOTE — Telephone Encounter (Signed)
What would you like her to try in place of Atorvaststin

## 2015-02-03 ENCOUNTER — Telehealth: Payer: Self-pay | Admitting: Emergency Medicine

## 2015-02-04 MED ORDER — ROSUVASTATIN CALCIUM 20 MG PO TABS: 20.0000 mg | ORAL_TABLET | Freq: Every day | ORAL | Status: DC

## 2015-02-04 NOTE — Telephone Encounter (Signed)
Patient notified to try Crestor 20 my. #30 with 1 refill called to [facility]. Will give 3 month supply to mail order if no issues.

## 2015-02-07 ENCOUNTER — Telehealth: Payer: Self-pay | Admitting: Emergency Medicine

## 2015-02-07 ENCOUNTER — Telehealth: Payer: Self-pay | Admitting: Family Medicine

## 2015-02-07 NOTE — Telephone Encounter (Signed)
PT NEEDS INHALER REFILLED DUE TO THE AIR. IS HAVING SOME PROBLEM BREATHING. PHARM IS [facility] ON [address].

## 2015-02-07 NOTE — Telephone Encounter (Signed)
Script sent to pharmacy on 11/15

## 2015-02-10 ENCOUNTER — Other Ambulatory Visit: Payer: Self-pay | Admitting: Emergency Medicine

## 2015-02-10 MED ORDER — ALBUTEROL SULFATE HFA 108 (90 BASE) MCG/ACT IN AERS: 2.0000 | INHALATION_SPRAY | Freq: Four times a day (QID) | RESPIRATORY_TRACT | Status: DC | PRN

## 2015-02-10 NOTE — Telephone Encounter (Signed)
Script sent in for proventil to [facility]

## 2015-02-12 ENCOUNTER — Other Ambulatory Visit: Payer: Self-pay

## 2015-02-12 DIAGNOSIS — J4520 Mild intermittent asthma, uncomplicated: Secondary | ICD-10-CM

## 2015-02-12 MED ORDER — ALBUTEROL SULFATE HFA 108 (90 BASE) MCG/ACT IN AERS: 2.0000 | INHALATION_SPRAY | RESPIRATORY_TRACT | Status: DC | PRN

## 2015-02-12 NOTE — Telephone Encounter (Signed)
Proventil HFA 2 puffs 4 times a day when necessary #1 with 5 refills

## 2015-02-27 ENCOUNTER — Encounter: Payer: Self-pay | Admitting: Family Medicine

## 2015-02-27 ENCOUNTER — Ambulatory Visit
Admission: RE | Admit: 2015-02-27 | Discharge: 2015-02-27 | Disposition: A | Discharge status: Home / Self Care | Payer: 59 | Source: Ambulatory Visit | Attending: Family Medicine | Admitting: Family Medicine

## 2015-02-27 ENCOUNTER — Ambulatory Visit (INDEPENDENT_AMBULATORY_CARE_PROVIDER_SITE_OTHER): Payer: 59 | Admitting: Family Medicine

## 2015-02-27 VITALS — BP 124/78 | HR 91 | Temp 98.1°F | Resp 18 | Ht 67.0 in | Wt 240.1 lb

## 2015-02-27 DIAGNOSIS — M50323 Other cervical disc degeneration at C6-C7 level: Secondary | ICD-10-CM | POA: Diagnosis not present

## 2015-02-27 DIAGNOSIS — E785 Hyperlipidemia, unspecified: Secondary | ICD-10-CM

## 2015-02-27 DIAGNOSIS — T887XXA Unspecified adverse effect of drug or medicament, initial encounter: Secondary | ICD-10-CM

## 2015-02-27 DIAGNOSIS — J029 Acute pharyngitis, unspecified: Secondary | ICD-10-CM

## 2015-02-27 DIAGNOSIS — M25512 Pain in left shoulder: Secondary | ICD-10-CM

## 2015-02-27 DIAGNOSIS — E0843 Diabetes mellitus due to underlying condition with diabetic autonomic (poly)neuropathy: Secondary | ICD-10-CM

## 2015-02-27 DIAGNOSIS — G4733 Obstructive sleep apnea (adult) (pediatric): Secondary | ICD-10-CM | POA: Diagnosis not present

## 2015-02-27 DIAGNOSIS — Z889 Allergy status to unspecified drugs, medicaments and biological substances status: Secondary | ICD-10-CM

## 2015-02-27 DIAGNOSIS — M50322 Other cervical disc degeneration at C5-C6 level: Secondary | ICD-10-CM | POA: Diagnosis not present

## 2015-02-27 DIAGNOSIS — I10 Essential (primary) hypertension: Secondary | ICD-10-CM

## 2015-02-27 DIAGNOSIS — T50905A Adverse effect of unspecified drugs, medicaments and biological substances, initial encounter: Secondary | ICD-10-CM

## 2015-02-27 DIAGNOSIS — Z789 Other specified health status: Secondary | ICD-10-CM

## 2015-02-27 MED ORDER — GABAPENTIN 300 MG PO CAPS: 300.0000 mg | ORAL_CAPSULE | Freq: Three times a day (TID) | ORAL | Status: DC

## 2015-02-27 NOTE — Progress Notes (Signed)
Name: Sarah Gordon   MRN: [id number]    DOB: [birth year]   Date:02/27/2015       Progress Note  Subjective  Chief Complaint  Chief Complaint  Patient presents with  . Medication Reaction    side effects from crestor  . Sore Throat    for 2 days    HPI  Adverse reaction to medication  Patient has been on Crestor for number weeks. As with all other statins she is experiencing myalgias and joint pains associated with this usage. She wishes to try an over-the-counter herbal medication as a trial as she is unable to tolerate statins. Mentions area but she prefers to have an over-the-counter herbal medications. She is informed that there is no medical prove an FDA approved or review medical studies that would justify the usage of these products.  Pharyngitis  Complaint of sore throat pain for several days. No fever chills myalgias.  Chronic low back pain patient has chronic lower back discomfort. She is currently on Neurontin 100 mg 3 times daily. This is not brought significant improvement and she's staying awake at night. The back pain has been present for over 5 years. There is no degenerative disc disease with attempts at improvement with NSAIDs and muscle relaxants and successfully she is encouraged to use narcotic maintenance pain meds as well  Past Medical History  Diagnosis Date  . Obstructive sleep apnea (adult) (pediatric)   . Myalgia and myositis, unspecified   . Type II or unspecified type diabetes mellitus without mention of complication, not stated as uncontrolled   . Irritable bowel syndrome   . Unspecified hypothyroidism   . Mixed hyperlipidemia   . Unspecified essential hypertension   . Esophageal reflux   . Herpes simplex without mention of complication   . Localized osteoarthrosis not specified whether primary or secondary, unspecified site   . Migraine, unspecified, without mention of intractable migraine without mention of status migrainosus   . Gouty arthropathy,  unspecified   . Umbilical hernia without mention of obstruction or gangrene   . Palpitations   . Asthma     Social History  Substance Use Topics  . Smoking status: Never Smoker   . Smokeless tobacco: Never Used  . Alcohol Use: No     Comment: RARELY     Current outpatient prescriptions:  .  albuterol (PROVENTIL HFA;VENTOLIN HFA) 108 (90 BASE) MCG/ACT inhaler, Inhale 2 puffs into the lungs every 4 (four) hours as needed for wheezing or shortness of breath., Disp: 1 Inhaler, Rfl: 5 .  allopurinol (ZYLOPRIM) 300 MG tablet, Take 300 mg by mouth daily., Disp: , Rfl:  .  azithromycin (ZITHROMAX Z-PAK) 250 MG tablet, 2 tabs po x day , then 1 tab po q day x 4 days, Disp: 6 each, Rfl: 0 .  chlorpheniramine-HYDROcodone (TUSSIONEX PENNKINETIC ER) 10-8 MG/5ML SUER, Take 5 mLs by mouth every 12 (twelve) hours as needed for cough., Disp: 70 mL, Rfl: 0 .  cyclobenzaprine (FLEXERIL) 10 MG tablet, Take 10 mg by mouth 2 (two) times daily as needed for muscle spasms. , Disp: , Rfl:  .  gabapentin (NEURONTIN) 100 MG capsule, Take 1 capsule by mouth 3  times a day as needed, Disp: 270 capsule, Rfl: 3 .  levothyroxine (SYNTHROID, LEVOTHROID) 125 MCG tablet, Take 1 tablet by mouth  daily, Disp: 90 tablet, Rfl: 1 .  NON FORMULARY, Take 2 capsules by mouth daily. MicroPlex Food Supplement, Disp: , Rfl:  .  NON FORMULARY, Take 2  capsules by mouth daily. Alpha CRS +, Disp: , Rfl:  .  omeprazole (PRILOSEC) 20 MG capsule, Take 1 capsule by mouth  daily, Disp: 90 capsule, Rfl: 1 .  OVER THE COUNTER MEDICATION, Take 1 capsule by mouth 2 (two) times daily. Deep blue polyphenol, Disp: , Rfl:  .  OVER THE COUNTER MEDICATION, Take 1 capsule by mouth 2 (two) times daily. DDR prime, Disp: , Rfl:  .  OVER THE COUNTER MEDICATION, Take 2 capsules by mouth every morning. XEO Mega, Disp: , Rfl:  .  OVER THE COUNTER MEDICATION, Take 1 capsule by mouth 2 (two) times daily. digestzen, Disp: , Rfl:  .  rosuvastatin (CRESTOR) 20 MG  tablet, Take 1 tablet (20 mg total) by mouth daily., Disp: 30 tablet, Rfl: 1 .  SUMAtriptan (IMITREX) 100 MG tablet, Take 100 mg by mouth every 2 (two) hours as needed for migraine. May repeat in 2 hours if headache persists or recurs., Disp: , Rfl:  .  valACYclovir (VALTREX) 1000 MG tablet, Take 1,000 mg by mouth 2 (two) times daily as needed (shingles). , Disp: , Rfl:   Allergies  Allergen Reactions  . Aspir-81 [Aspirin]     Coagulation disorder  . Fluzone [Flu Virus Vaccine]   . Lipitor [Atorvastatin] Other (See Comments)    Joint pain  . Topamax [Topiramate] Other (See Comments)    Cognitive issues   . Vascepa [Epa Ethyl Ester] Swelling  . Voltaren [Diclofenac Sodium] Hives    Review of Systems  Constitutional: Negative for fever, chills and weight loss.  HENT: Positive for sore throat. Negative for congestion, hearing loss and tinnitus.   Eyes: Negative for blurred vision, double vision and redness.  Respiratory: Negative for cough, hemoptysis and shortness of breath.   Cardiovascular: Negative for chest pain, palpitations, orthopnea, claudication and leg swelling.  Gastrointestinal: Negative for heartburn, nausea, vomiting, diarrhea, constipation and blood in stool.  Genitourinary: Negative for dysuria, urgency, frequency and hematuria.  Musculoskeletal: Positive for myalgias, back pain, joint pain and neck pain. Negative for falls.  Skin: Negative for itching.  Neurological: Negative for dizziness, tingling, tremors, focal weakness, seizures, loss of consciousness, weakness and headaches.  Endo/Heme/Allergies: Does not bruise/bleed easily.  Psychiatric/Behavioral: Negative for depression and substance abuse. The patient is not nervous/anxious and does not have insomnia.      Objective  Filed Vitals:   02/27/15 0809  BP: 124/78  Pulse: 91  Temp: 98.1 F (36.7 C)  Resp: 18  Height: 5\' 7"  (1.702 m)  Weight: 240 lb 1 oz (108.892 kg)  SpO2: 96%     Physical Exam   Constitutional: She is oriented to person, place, and time.  Morbidly obese no acute distress  HENT:  Head: Normocephalic.  Oropharynx is mildly injected no exudate  Eyes: EOM are normal. Pupils are equal, round, and reactive to light.  Neck: Normal range of motion. No thyromegaly present.  Cervical nodes are slightly tender and enlarged  Cardiovascular: Normal rate, regular rhythm and normal heart sounds.   No murmur heard. Pulmonary/Chest: Effort normal and breath sounds normal.  Abdominal: Soft. Bowel sounds are normal.  Musculoskeletal: She exhibits no edema.  Tenderness to palpation over the muscles of the thigh and hamstring area. Negative Homans sign. There is some tenderness along the lower lumbar segments and sacral area  Lymphadenopathy:    She has cervical adenopathy.  Neurological: She is alert and oriented to person, place, and time. No cranial nerve deficit. Gait normal.  Skin: Skin is  warm and dry. No rash noted.  Psychiatric: Memory and affect normal.      Assessment & Plan   1. Essential hypertension Well-controlled  2. Obstructive sleep apnea CPAP  3. Diabetic autonomic neuropathy associated with diabetes mellitus due to underlying condition (HCC) Increase Neurontin to 300 mg daily at bedtime for 1 week then twice a day for one week and 3 times a day - gabapentin (NEURONTIN) 300 MG capsule; Take 1 capsule (300 mg total) by mouth 3 (three) times daily.  Dispense: 90 capsule; Refill: 3  4. Hyperlipidemia She cannot tolerate statins and therefore she will try an over-the-counter herbal type medication  5. Medication side effect, initial encounter Statin intolerance  6. Left shoulder pain X-ray of the shoulder - DG Shoulder Left; Future - DG Cervical Spine Complete; Future  7. Sore throat Strep test negative symptomatically treatment - POCT rapid strep A

## 2015-02-28 DIAGNOSIS — Z789 Other specified health status: Secondary | ICD-10-CM | POA: Insufficient documentation

## 2015-03-28 ENCOUNTER — Other Ambulatory Visit: Payer: Self-pay | Admitting: Family Medicine

## 2015-03-28 DIAGNOSIS — E0843 Diabetes mellitus due to underlying condition with diabetic autonomic (poly)neuropathy: Secondary | ICD-10-CM

## 2015-03-28 MED ORDER — LEVOTHYROXINE SODIUM 125 MCG PO TABS: ORAL_TABLET | ORAL | Status: DC

## 2015-03-28 MED ORDER — GABAPENTIN 300 MG PO CAPS: 300.0000 mg | ORAL_CAPSULE | Freq: Three times a day (TID) | ORAL | Status: DC

## 2015-04-07 ENCOUNTER — Ambulatory Visit (INDEPENDENT_AMBULATORY_CARE_PROVIDER_SITE_OTHER): Payer: 59 | Admitting: Family Medicine

## 2015-04-07 ENCOUNTER — Encounter: Payer: Self-pay | Admitting: Family Medicine

## 2015-04-07 VITALS — BP 138/72 | HR 89 | Temp 98.7°F | Resp 16 | Ht 67.0 in | Wt 246.3 lb

## 2015-04-07 DIAGNOSIS — N63 Unspecified lump in breast: Secondary | ICD-10-CM

## 2015-04-07 DIAGNOSIS — N6310 Unspecified lump in the right breast, unspecified quadrant: Secondary | ICD-10-CM

## 2015-04-07 NOTE — Progress Notes (Signed)
Name: Sarah Gordon   MRN: [id number]    DOB: March 15, 1951   Date:04/07/2015       Progress Note  Subjective  Chief Complaint  Chief Complaint  Patient presents with  . Breast Problem    new found lump in right breast at 3.oclock for 1 month    HPI  Patient found a new lump in her right breast about 1 month ago. There is no history of any trauma and there is been no pain no discharge change in appearance of the breasts or lymph nodes swelling night sweats or weight loss. 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 Mammogram is shown shadow on the left breast for which was gone by the time it was repeated. There is no family history of breast cancer. Last mammogram was August of last year was normal  Past Medical History  Diagnosis Date  . Obstructive sleep apnea (adult) (pediatric)   . Myalgia and myositis, unspecified   . Type II or unspecified type diabetes mellitus without mention of complication, not stated as uncontrolled   . Irritable bowel syndrome   . Unspecified hypothyroidism   . Mixed hyperlipidemia   . Unspecified essential hypertension   . Esophageal reflux   . Herpes simplex without mention of complication   . Localized osteoarthrosis not specified whether primary or secondary, unspecified site   . Migraine, unspecified, without mention of intractable migraine without mention of status migrainosus   . Gouty arthropathy, unspecified   . Umbilical hernia without mention of obstruction or gangrene   . Palpitations   . Asthma     Social History  Substance Use Topics  . Smoking status: Never Smoker   . Smokeless tobacco: Never Used  . Alcohol Use: No     Comment: RARELY     Current outpatient prescriptions:  .  albuterol (PROVENTIL HFA;VENTOLIN HFA) 108 (90 BASE) MCG/ACT inhaler, Inhale 2 puffs into the lungs every 4 (four) hours as needed for wheezing or shortness of breath., Disp: 1 Inhaler, Rfl: 5 .  allopurinol (ZYLOPRIM) 300 MG tablet, Take  300 mg by mouth daily., Disp: , Rfl:  .  azithromycin (ZITHROMAX Z-PAK) 250 MG tablet, 2 tabs po x day , then 1 tab po q day x 4 days, Disp: 6 each, Rfl: 0 .  chlorpheniramine-HYDROcodone (TUSSIONEX PENNKINETIC ER) 10-8 MG/5ML SUER, Take 5 mLs by mouth every 12 (twelve) hours as needed for cough., Disp: 70 mL, Rfl: 0 .  cyclobenzaprine (FLEXERIL) 10 MG tablet, Take 10 mg by mouth 2 (two) times daily as needed for muscle spasms. , Disp: , Rfl:  .  gabapentin (NEURONTIN) 300 MG capsule, Take 1 capsule (300 mg total) by mouth 3 (three) times daily., Disp: 270 capsule, Rfl: 1 .  levothyroxine (SYNTHROID, LEVOTHROID) 125 MCG tablet, Take 1 tablet by mouth  daily, Disp: 90 tablet, Rfl: 1 .  NON FORMULARY, Take 2 capsules by mouth daily. MicroPlex Food Supplement, Disp: , Rfl:  .  NON FORMULARY, Take 2 capsules by mouth daily. Alpha CRS +, Disp: , Rfl:  .  omeprazole (PRILOSEC) 20 MG capsule, Take 1 capsule by mouth  daily, Disp: 90 capsule, Rfl: 1 .  OVER THE COUNTER MEDICATION, Take 1 capsule by mouth 2 (two) times daily. Deep blue polyphenol, Disp: , Rfl:  .  OVER THE COUNTER MEDICATION, Take 1 capsule by mouth 2 (two) times daily. DDR prime, Disp: , Rfl:  .  OVER THE COUNTER MEDICATION, Take 2  capsules by mouth every morning. XEO Mega, Disp: , Rfl:  .  OVER THE COUNTER MEDICATION, Take 1 capsule by mouth 2 (two) times daily. digestzen, Disp: , Rfl:  .  rosuvastatin (CRESTOR) 20 MG tablet, Take 1 tablet (20 mg total) by mouth daily., Disp: 30 tablet, Rfl: 1 .  SUMAtriptan (IMITREX) 100 MG tablet, Take 100 mg by mouth every 2 (two) hours as needed for migraine. May repeat in 2 hours if headache persists or recurs., Disp: , Rfl:  .  valACYclovir (VALTREX) 1000 MG tablet, Take 1,000 mg by mouth 2 (two) times daily as needed (shingles). , Disp: , Rfl:   Allergies  Allergen Reactions  . Aspir-81 [Aspirin]     Coagulation disorder  . Fluzone [Flu Virus Vaccine]   . Lipitor [Atorvastatin] Other (See  Comments)    Joint pain  . Topamax [Topiramate] Other (See Comments)    Cognitive issues   . Vascepa [Epa Ethyl Ester] Swelling  . Voltaren [Diclofenac Sodium] Hives    Review of Systems  Constitutional: Negative for fever, chills and weight loss.       Patient is noted a mass in right breast  HENT: Negative for congestion, hearing loss, sore throat and tinnitus.   Eyes: Negative for blurred vision, double vision and redness.  Respiratory: Negative for cough, hemoptysis and shortness of breath.   Cardiovascular: Negative for chest pain, palpitations, orthopnea, claudication and leg swelling.  Gastrointestinal: Negative for heartburn, nausea, vomiting, diarrhea, constipation and blood in stool.  Genitourinary: Negative for dysuria, urgency, frequency and hematuria.  Musculoskeletal: Negative for myalgias, back pain, joint pain, falls and neck pain.  Skin: Negative for itching.  Neurological: Negative for dizziness, tingling, tremors, focal weakness, seizures, loss of consciousness, weakness and headaches.  Endo/Heme/Allergies: Does not bruise/bleed easily.  Psychiatric/Behavioral: Negative for depression and substance abuse. The patient is not nervous/anxious and does not have insomnia.      Objective  Filed Vitals:   04/07/15 1218  BP: 138/72  Pulse: 89  Temp: 98.7 F (37.1 C)  TempSrc: Oral  Resp: 16  Height: 5\' 7"  (1.702 m)  Weight: 246 lb 4.8 oz (111.721 kg)  SpO2: 97%     Physical Exam  Constitutional:  Obesity no acute distress  Pulmonary/Chest:  There is a 1 cm cystic area palpable in the right breast at about 3:00 which is freely mobile. There is no adherence to the overlying skin or warmth or erythema is no axillary adenopathy. The left breast is unremarkable.      Assessment & Plan   1. Breast mass, right Differential diagnosis includes benign cyst sebaceous cyst hematoma malignancy  - MM Digital Diagnostic Unilat R; Future

## 2015-04-15 ENCOUNTER — Telehealth: Payer: Self-pay | Admitting: Family Medicine

## 2015-04-15 NOTE — Telephone Encounter (Signed)
Patient is checking status on her referral appointment. She is requesting a morning appointment. Stated that you said you would place the referral for her.

## 2015-04-18 NOTE — Telephone Encounter (Signed)
Shelly to work on referral

## 2015-04-18 NOTE — Addendum Note (Signed)
Addended by: Lolita Rieger D on: 04/18/2015 02:21 PM   Modules accepted: Orders

## 2015-05-08 ENCOUNTER — Other Ambulatory Visit: Payer: Self-pay | Admitting: Family Medicine

## 2015-05-08 ENCOUNTER — Ambulatory Visit
Admission: RE | Admit: 2015-05-08 | Discharge: 2015-05-08 | Disposition: A | Discharge status: Home / Self Care | Payer: 59 | Source: Ambulatory Visit | Attending: Family Medicine | Admitting: Family Medicine

## 2015-05-08 DIAGNOSIS — N6310 Unspecified lump in the right breast, unspecified quadrant: Secondary | ICD-10-CM

## 2015-05-08 DIAGNOSIS — R921 Mammographic calcification found on diagnostic imaging of breast: Secondary | ICD-10-CM | POA: Insufficient documentation

## 2015-05-08 DIAGNOSIS — N63 Unspecified lump in breast: Secondary | ICD-10-CM | POA: Diagnosis present

## 2015-05-09 ENCOUNTER — Telehealth: Payer: Self-pay | Admitting: Family Medicine

## 2015-05-09 NOTE — Telephone Encounter (Signed)
Patient would like a spacer for her inhaler

## 2015-05-12 ENCOUNTER — Ambulatory Visit: Payer: 59 | Admitting: Family Medicine

## 2015-05-14 MED ORDER — E-Z SPACER DEVI: Status: DC

## 2015-05-19 ENCOUNTER — Ambulatory Visit: Payer: 59 | Admitting: Family Medicine

## 2015-06-23 ENCOUNTER — Ambulatory Visit: Payer: 59 | Admitting: Family Medicine

## 2015-06-27 ENCOUNTER — Other Ambulatory Visit: Payer: Self-pay | Admitting: Family Medicine

## 2015-08-07 ENCOUNTER — Ambulatory Visit (INDEPENDENT_AMBULATORY_CARE_PROVIDER_SITE_OTHER): Payer: 59 | Admitting: Family Medicine

## 2015-08-07 ENCOUNTER — Encounter: Payer: Self-pay | Admitting: Family Medicine

## 2015-08-07 VITALS — BP 128/76 | HR 82 | Temp 99.0°F | Resp 18 | Ht 67.0 in | Wt 234.2 lb

## 2015-08-07 DIAGNOSIS — J209 Acute bronchitis, unspecified: Secondary | ICD-10-CM

## 2015-08-07 DIAGNOSIS — Z7189 Other specified counseling: Secondary | ICD-10-CM | POA: Diagnosis not present

## 2015-08-07 DIAGNOSIS — Z7184 Encounter for health counseling related to travel: Secondary | ICD-10-CM | POA: Insufficient documentation

## 2015-08-07 DIAGNOSIS — K580 Irritable bowel syndrome with diarrhea: Secondary | ICD-10-CM

## 2015-08-07 MED ORDER — BENZONATATE 100 MG PO CAPS: 100.0000 mg | ORAL_CAPSULE | Freq: Three times a day (TID) | ORAL | Status: DC | PRN

## 2015-08-07 MED ORDER — AZITHROMYCIN 250 MG PO TABS: ORAL_TABLET | ORAL | Status: DC

## 2015-08-07 MED ORDER — CLONAZEPAM 0.5 MG PO TABS: 0.5000 mg | ORAL_TABLET | Freq: Three times a day (TID) | ORAL | Status: DC | PRN

## 2015-08-07 MED ORDER — DICYCLOMINE HCL 20 MG PO TABS: 20.0000 mg | ORAL_TABLET | Freq: Three times a day (TID) | ORAL | Status: DC

## 2015-08-07 NOTE — Progress Notes (Signed)
Name: Sarah Gordon   MRN: [id number]    DOB: 17-Mar-1951   Date:08/07/2015       Progress Note  Subjective  Chief Complaint  Chief Complaint  Patient presents with  . Irritable Bowel Syndrome    was having diarrhea, cramping, pain when eating. Flare up do to stress  . URI    HPI  Gas and Abdominal Pain: Pt. Appears to be having an IBS flare-up, reports gas, frequent burps, abdominal cramps (as if having a knot in her abdomen). She is under a lot of stress mainly due to her family members, she usually has infrequent IBS flare ups, but this time its affecting her more. Also feels as if having an upper respiratory infection, sore throat, coughing with some mucus, no fevers or chills but had a clammy feeling yesterday. Symptoms present for 5 days. Pt. Is leaving on a mission trip to [place], she is requesting a Z-pak to take along with her in case she gets sick and develops upper respiratory symptoms. Her PCP usually givers her a Z-pak when ever she is leaving on a mission trip.   Past Medical History  Diagnosis Date  . Obstructive sleep apnea (adult) (pediatric)   . Myalgia and myositis, unspecified   . Type II or unspecified type diabetes mellitus without mention of complication, not stated as uncontrolled   . Irritable bowel syndrome   . Unspecified hypothyroidism   . Mixed hyperlipidemia   . Unspecified essential hypertension   . Esophageal reflux   . Herpes simplex without mention of complication   . Localized osteoarthrosis not specified whether primary or secondary, unspecified site   . Migraine, unspecified, without mention of intractable migraine without mention of status migrainosus   . Gouty arthropathy, unspecified   . Umbilical hernia without mention of obstruction or gangrene   . Palpitations   . Asthma     Past Surgical History  Procedure Laterality Date  . Foot surgery Left     heel spur  . Nasal sinus surgery  1980s  . Umbilical hernia repair    . Hernia  repair      umbilical  . Colonoscopy      Dr Thurmond Butts  . Esophagogastroduodenoscopy N/A 08/20/2014    Procedure: ESOPHAGOGASTRODUODENOSCOPY (EGD);  Surgeon: Kieth Brightly, MD;  Location: [facility] ENDOSCOPY;  Service: Endoscopy;  Laterality: N/A;  . Breast biopsy Left     benign    Family History  Problem Relation Age of Onset  . Hypertension Mother   . Hyperlipidemia Mother   . Heart disease Brother 51    CABG x 3   . Heart attack Brother 55  . Lupus Sister     Social History   Social History  . Marital Status: Married    Spouse Name: N/A  . Number of Children: N/A  . Years of Education: N/A   Occupational History  . Not on file.   Social History Main Topics  . Smoking status: Never Smoker   . Smokeless tobacco: Never Used  . Alcohol Use: No     Comment: RARELY  . Drug Use: No  . Sexual Activity: No   Other Topics Concern  . Not on file   Social History Narrative     Current outpatient prescriptions:  .  albuterol (PROVENTIL HFA;VENTOLIN HFA) 108 (90 BASE) MCG/ACT inhaler, Inhale 2 puffs into the lungs every 4 (four) hours as needed for wheezing or shortness of breath., Disp: 1 Inhaler, Rfl: 5 .  allopurinol (ZYLOPRIM) 300 MG tablet, Take 300 mg by mouth daily., Disp: , Rfl:  .  cyclobenzaprine (FLEXERIL) 10 MG tablet, Take 10 mg by mouth 2 (two) times daily as needed for muscle spasms. , Disp: , Rfl:  .  gabapentin (NEURONTIN) 300 MG capsule, Take 1 capsule (300 mg total) by mouth 3 (three) times daily., Disp: 270 capsule, Rfl: 1 .  levothyroxine (SYNTHROID, LEVOTHROID) 125 MCG tablet, Take 1 tablet by mouth  daily, Disp: 90 tablet, Rfl: 0 .  NON FORMULARY, Take 2 capsules by mouth daily. MicroPlex Food Supplement, Disp: , Rfl:  .  NON FORMULARY, Take 2 capsules by mouth daily. Alpha CRS +, Disp: , Rfl:  .  omeprazole (PRILOSEC) 20 MG capsule, Take 1 capsule by mouth  daily, Disp: 90 capsule, Rfl: 0 .  OVER THE COUNTER MEDICATION, Take 1 capsule by mouth 2 (two)  times daily. Deep blue polyphenol, Disp: , Rfl:  .  OVER THE COUNTER MEDICATION, Take 1 capsule by mouth 2 (two) times daily. DDR prime, Disp: , Rfl:  .  OVER THE COUNTER MEDICATION, Take 2 capsules by mouth every morning. XEO Mega, Disp: , Rfl:  .  OVER THE COUNTER MEDICATION, Take 1 capsule by mouth 2 (two) times daily. digestzen, Disp: , Rfl:  .  rosuvastatin (CRESTOR) 20 MG tablet, Take 1 tablet (20 mg total) by mouth daily., Disp: 30 tablet, Rfl: 1 .  Spacer/Aero-Holding Chambers (E-Z SPACER) inhaler, Use as instructed, Disp: 1 each, Rfl: 1 .  SUMAtriptan (IMITREX) 100 MG tablet, Take 100 mg by mouth every 2 (two) hours as needed for migraine. May repeat in 2 hours if headache persists or recurs., Disp: , Rfl:  .  valACYclovir (VALTREX) 1000 MG tablet, Take 1,000 mg by mouth 2 (two) times daily as needed (shingles). , Disp: , Rfl:   Allergies  Allergen Reactions  . Aspir-81 [Aspirin]     Coagulation disorder  . Fluzone [Flu Virus Vaccine]   . Lipitor [Atorvastatin] Other (See Comments)    Joint pain  . Topamax [Topiramate] Other (See Comments)    Cognitive issues   . Vascepa [Epa Ethyl Ester] Swelling  . Voltaren [Diclofenac Sodium] Hives     Review of Systems  Constitutional: Negative for chills.  HENT: Positive for sore throat.   Respiratory: Positive for cough and sputum production.   Cardiovascular: Positive for chest pain (chest tightness).  Gastrointestinal: Positive for nausea, abdominal pain and diarrhea. Negative for heartburn, vomiting and blood in stool.    Objective  Filed Vitals:   08/07/15 0902  BP: 128/76  Pulse: 82  Temp: 99 F (37.2 C)  TempSrc: Oral  Resp: 18  Height: 5\' 7"  (1.702 m)  Weight: 234 lb 3.2 oz (106.232 kg)  SpO2: 98%    Physical Exam  Constitutional: She is oriented to person, place, and time and well-developed, well-nourished, and in no distress.  HENT:  Head: Normocephalic and atraumatic.  Mouth/Throat: Mucous membranes are  normal. Posterior oropharyngeal erythema present. No oropharyngeal exudate.  Cardiovascular: Normal rate and regular rhythm.   Pulmonary/Chest: Effort normal. She has wheezes. She has no rhonchi.  Abdominal: Soft. Bowel sounds are normal. There is tenderness. There is no rigidity and no guarding.  Generalized abdominal tenderness  Neurological: She is alert and oriented to person, place, and time.  Psychiatric: Memory, affect and judgment normal.  Nursing note and vitals reviewed.   Assessment & Plan  1. Irritable bowel syndrome with diarrhea We will start on Bentyl and  clonazepam to help relieve with her anxiety and associated GI symptoms. - dicyclomine (BENTYL) 20 MG tablet; Take 1 tablet (20 mg total) by mouth 4 (four) times daily -  before meals and at bedtime.  Dispense: 60 tablet; Refill: 0 - clonazePAM (KLONOPIN) 0.5 MG tablet; Take 1 tablet (0.5 mg total) by mouth 3 (three) times daily as needed for anxiety.  Dispense: 30 tablet; Refill: 0  2. Acute bronchitis, unspecified organism  - azithromycin (ZITHROMAX) 250 MG tablet; 2 tabs po day 1, then 1 tab po q day x 4 days  Dispense: 6 each; Refill: 1 - benzonatate (TESSALON) 100 MG capsule; Take 1 capsule (100 mg total) by mouth 3 (three) times daily as needed for cough.  Dispense: 20 capsule; Refill: 0  3. Encounter for counseling for travel Authorized one refill on Z-Pak to take with her when she travels to [place]   Jaedon Siler Asad A[facility] 08/07/2015 9:08 AM

## 2015-08-12 ENCOUNTER — Ambulatory Visit: Payer: 59 | Admitting: Family Medicine

## 2015-08-18 ENCOUNTER — Other Ambulatory Visit: Payer: Self-pay | Admitting: Family Medicine

## 2015-08-21 ENCOUNTER — Telehealth: Payer: Self-pay

## 2015-08-21 MED ORDER — OMEPRAZOLE 20 MG PO CPDR: DELAYED_RELEASE_CAPSULE | ORAL | Status: DC

## 2015-08-21 MED ORDER — LEVOTHYROXINE SODIUM 125 MCG PO TABS: ORAL_TABLET | ORAL | Status: DC

## 2015-08-21 NOTE — Telephone Encounter (Signed)
Medication has been refilled and sent to [facility] 

## 2015-08-28 ENCOUNTER — Telehealth: Payer: Self-pay | Admitting: Family Medicine

## 2015-08-28 NOTE — Telephone Encounter (Signed)
Pt said that she is leaving for a Mission Trip on June 19 and she has now developed a sinus infection where is blowing very dark green with pressure over her eyes and teeth. Could something be called in for her. Pharm is [facility] on [address]

## 2015-08-29 NOTE — Telephone Encounter (Signed)
Pt needs appointment please schedule on Dr. 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

## 2015-09-01 ENCOUNTER — Ambulatory Visit (INDEPENDENT_AMBULATORY_CARE_PROVIDER_SITE_OTHER): Payer: 59 | Admitting: Family Medicine

## 2015-09-01 ENCOUNTER — Encounter: Payer: Self-pay | Admitting: Family Medicine

## 2015-09-01 VITALS — BP 126/77 | HR 114 | Temp 98.2°F | Resp 18 | Ht 67.0 in | Wt 239.1 lb

## 2015-09-01 DIAGNOSIS — B0089 Other herpesviral infection: Secondary | ICD-10-CM

## 2015-09-01 DIAGNOSIS — J0100 Acute maxillary sinusitis, unspecified: Secondary | ICD-10-CM | POA: Diagnosis not present

## 2015-09-01 DIAGNOSIS — B009 Herpesviral infection, unspecified: Secondary | ICD-10-CM

## 2015-09-01 MED ORDER — AZITHROMYCIN 250 MG PO TABS: ORAL_TABLET | ORAL | Status: DC

## 2015-09-01 MED ORDER — VALACYCLOVIR HCL 1 G PO TABS: 1000.0000 mg | ORAL_TABLET | Freq: Two times a day (BID) | ORAL | Status: DC

## 2015-09-01 NOTE — Progress Notes (Signed)
Name: Sarah Gordon   MRN: [id number]    DOB: 13-Jul-1950   Date:09/01/2015       Progress Note  Subjective  Chief Complaint  Chief Complaint  Patient presents with  . Acute Visit    Sinus infection     Sinusitis This is a new problem. The current episode started in the past 7 days (6 days ago). There has been no fever. Associated symptoms include congestion, coughing, a hoarse voice (starting to get hoarse.), sinus pressure and a sore throat. Pertinent negatives include no shortness of breath. Treatments tried: Has not taken Prednisone prescribed by [facility].    Past Medical History  Diagnosis Date  . Obstructive sleep apnea (adult) (pediatric)   . Myalgia and myositis, unspecified   . Type II or unspecified type diabetes mellitus without mention of complication, not stated as uncontrolled   . Irritable bowel syndrome   . Unspecified hypothyroidism   . Mixed hyperlipidemia   . Unspecified essential hypertension   . Esophageal reflux   . Herpes simplex without mention of complication   . Localized osteoarthrosis not specified whether primary or secondary, unspecified site   . Migraine, unspecified, without mention of intractable migraine without mention of status migrainosus   . Gouty arthropathy, unspecified   . Umbilical hernia without mention of obstruction or gangrene   . Palpitations   . Asthma     Past Surgical History  Procedure Laterality Date  . Foot surgery Left     heel spur  . Nasal sinus surgery  1980s  . Umbilical hernia repair    . Hernia repair      umbilical  . Colonoscopy      Dr Thurmond Butts  . Esophagogastroduodenoscopy N/A 08/20/2014    Procedure: ESOPHAGOGASTRODUODENOSCOPY (EGD);  Surgeon: Kieth Brightly, MD;  Location: [facility] ENDOSCOPY;  Service: Endoscopy;  Laterality: N/A;  . Breast biopsy Left     benign    Family History  Problem Relation Age of Onset  . Hypertension Mother   . Hyperlipidemia Mother   . Heart disease Brother 59    CABG x 3    . Heart attack Brother 55  . Lupus Sister     Social History   Social History  . Marital Status: Married    Spouse Name: N/A  . Number of Children: N/A  . Years of Education: N/A   Occupational History  . Not on file.   Social History Main Topics  . Smoking status: Never Smoker   . Smokeless tobacco: Never Used  . Alcohol Use: No     Comment: RARELY  . Drug Use: No  . Sexual Activity: No   Other Topics Concern  . Not on file   Social History Narrative     Current outpatient prescriptions:  .  albuterol (PROVENTIL HFA;VENTOLIN HFA) 108 (90 BASE) MCG/ACT inhaler, Inhale 2 puffs into the lungs every 4 (four) hours as needed for wheezing or shortness of breath., Disp: 1 Inhaler, Rfl: 5 .  benzonatate (TESSALON) 100 MG capsule, Take 1 capsule (100 mg total) by mouth 3 (three) times daily as needed for cough., Disp: 20 capsule, Rfl: 0 .  clonazePAM (KLONOPIN) 0.5 MG tablet, Take 1 tablet (0.5 mg total) by mouth 3 (three) times daily as needed for anxiety., Disp: 30 tablet, Rfl: 0 .  gabapentin (NEURONTIN) 300 MG capsule, Take 1 capsule (300 mg total) by mouth 3 (three) times daily., Disp: 270 capsule, Rfl: 1 .  levothyroxine (SYNTHROID, LEVOTHROID) 125 MCG  tablet, Take 1 tablet by mouth  daily, Disp: 90 tablet, Rfl: 0 .  NON FORMULARY, Take 2 capsules by mouth daily. MicroPlex Food Supplement, Disp: , Rfl:  .  NON FORMULARY, Take 2 capsules by mouth daily. Alpha CRS +, Disp: , Rfl:  .  omeprazole (PRILOSEC) 20 MG capsule, Take 1 capsule by mouth  daily, Disp: 90 capsule, Rfl: 0 .  OVER THE COUNTER MEDICATION, Take 1 capsule by mouth 2 (two) times daily. Deep blue polyphenol, Disp: , Rfl:  .  OVER THE COUNTER MEDICATION, Take 1 capsule by mouth 2 (two) times daily. DDR prime, Disp: , Rfl:  .  OVER THE COUNTER MEDICATION, Take 2 capsules by mouth every morning. XEO Mega, Disp: , Rfl:  .  OVER THE COUNTER MEDICATION, Take 1 capsule by mouth 2 (two) times daily. digestzen, Disp: ,  Rfl:  .  predniSONE (DELTASONE) 20 MG tablet, , Disp: , Rfl:  .  Spacer/Aero-Holding Chambers (E-Z SPACER) inhaler, Use as instructed, Disp: 1 each, Rfl: 1 .  valACYclovir (VALTREX) 1000 MG tablet, Take 1,000 mg by mouth 2 (two) times daily as needed (shingles). , Disp: , Rfl:   Allergies  Allergen Reactions  . Aspir-81 [Aspirin] Other (See Comments)    Coagulation disorder  . Fluzone [Flu Virus Vaccine]   . Lipitor [Atorvastatin] Other (See Comments)    Joint pain  . Statins Other (See Comments)  . Topamax [Topiramate] Other (See Comments)    Cognitive issues   . Vascepa [Epa Ethyl Ester] Swelling and Other (See Comments)  . Voltaren [Diclofenac Sodium] Hives and Other (See Comments)     Review of Systems  Constitutional: Negative for fever.  HENT: Positive for congestion, hoarse voice (starting to get hoarse.), sinus pressure and sore throat.   Respiratory: Positive for cough and sputum production. Negative for shortness of breath.       Objective  Filed Vitals:   09/01/15 1528  BP: 126/77  Pulse: 114  Temp: 98.2 F (36.8 C)  TempSrc: Oral  Resp: 18  Height: 5\' 7"  (1.702 m)  Weight: 239 lb 1.6 oz (108.455 kg)  SpO2: 98%    Physical Exam  Constitutional: She is well-developed, well-nourished, and in no distress.  HENT:  Nose: Right sinus exhibits maxillary sinus tenderness. Right sinus exhibits no frontal sinus tenderness. Left sinus exhibits maxillary sinus tenderness and frontal sinus tenderness.  Mouth/Throat: No posterior oropharyngeal erythema.  Cardiovascular: Normal rate and regular rhythm.   Pulmonary/Chest: Effort normal and breath sounds normal.  Nursing note and vitals reviewed.   Assessment & Plan  1. Acute non-recurrent maxillary sinusitis We'll start on Z-Pak for acute sinusitis. Prescription sent to pharmacy - azithromycin (ZITHROMAX) 250 MG tablet; 2 tabs po day 1, then 1 tab po q day x 4 days.  Dispense: 6 tablet; Refill: 0  2. Recurrent  oral herpes simplex infection Has recurrent oral herpes simplex, flareups in acute illnesses and other stressful events. Refill for Valtrex provided for 10 days - valACYclovir (VALTREX) 1000 MG tablet; Take 1 tablet (1,000 mg total) by mouth 2 (two) times daily.  Dispense: 20 tablet; Refill: 0   Mara Favero Asad A[facility] 09/01/2015 3:34 PM

## 2015-10-20 ENCOUNTER — Other Ambulatory Visit: Payer: Self-pay | Admitting: Family Medicine

## 2015-10-20 DIAGNOSIS — K580 Irritable bowel syndrome with diarrhea: Secondary | ICD-10-CM

## 2015-10-23 ENCOUNTER — Ambulatory Visit (INDEPENDENT_AMBULATORY_CARE_PROVIDER_SITE_OTHER): Payer: 59 | Admitting: Family Medicine

## 2015-10-23 ENCOUNTER — Encounter: Payer: Self-pay | Admitting: Family Medicine

## 2015-10-23 VITALS — BP 120/68 | HR 83 | Temp 99.2°F | Resp 18 | Ht 67.0 in | Wt 235.4 lb

## 2015-10-23 DIAGNOSIS — K580 Irritable bowel syndrome with diarrhea: Secondary | ICD-10-CM | POA: Diagnosis not present

## 2015-10-23 DIAGNOSIS — E785 Hyperlipidemia, unspecified: Secondary | ICD-10-CM

## 2015-10-23 DIAGNOSIS — Z1211 Encounter for screening for malignant neoplasm of colon: Secondary | ICD-10-CM

## 2015-10-23 DIAGNOSIS — R194 Change in bowel habit: Secondary | ICD-10-CM | POA: Diagnosis not present

## 2015-10-23 DIAGNOSIS — M109 Gout, unspecified: Secondary | ICD-10-CM

## 2015-10-23 DIAGNOSIS — R198 Other specified symptoms and signs involving the digestive system and abdomen: Secondary | ICD-10-CM

## 2015-10-23 DIAGNOSIS — E038 Other specified hypothyroidism: Secondary | ICD-10-CM

## 2015-10-23 DIAGNOSIS — G4733 Obstructive sleep apnea (adult) (pediatric): Secondary | ICD-10-CM | POA: Diagnosis not present

## 2015-10-23 DIAGNOSIS — Z1159 Encounter for screening for other viral diseases: Secondary | ICD-10-CM

## 2015-10-23 DIAGNOSIS — E1140 Type 2 diabetes mellitus with diabetic neuropathy, unspecified: Secondary | ICD-10-CM

## 2015-10-23 DIAGNOSIS — I10 Essential (primary) hypertension: Secondary | ICD-10-CM

## 2015-10-23 LAB — CBC WITH DIFFERENTIAL/PLATELET
Basophils Absolute: 39 cells/uL (ref 0–200)
Basophils Relative: 1 %
Eosinophils Absolute: 195 cells/uL (ref 15–500)
Eosinophils Relative: 5 %
HCT: 41.7 % (ref 35.0–45.0)
Hemoglobin: 13.8 g/dL (ref 11.7–15.5)
Lymphocytes Relative: 29 %
Lymphs Abs: 1131 cells/uL (ref 850–3900)
MCH: 31.2 pg (ref 27.0–33.0)
MCHC: 33.1 g/dL (ref 32.0–36.0)
MCV: 94.1 fL (ref 80.0–100.0)
MPV: 10.2 fL (ref 7.5–12.5)
Monocytes Absolute: 195 cells/uL — ABNORMAL LOW (ref 200–950)
Monocytes Relative: 5 %
Neutro Abs: 2340 cells/uL (ref 1500–7800)
Neutrophils Relative %: 60 %
Platelets: 219 10*3/uL (ref 140–400)
RBC: 4.43 MIL/uL (ref 3.80–5.10)
RDW: 13.4 % (ref 11.0–15.0)
WBC: 3.9 10*3/uL (ref 3.8–10.8)

## 2015-10-23 LAB — TSH: TSH: 1.13 mIU/L

## 2015-10-23 MED ORDER — AMITRIPTYLINE HCL 25 MG PO TABS
25.0000 mg | ORAL_TABLET | Freq: Every day | ORAL | 0 refills | Status: DC
Start: 2015-10-23 — End: 2016-05-06

## 2015-10-23 NOTE — Progress Notes (Signed)
Name: Sarah Gordon   MRN: [id number]    DOB: 03-05-1951   Date:10/23/2015       Progress Note  Subjective  Chief Complaint  Chief Complaint  Patient presents with  . Irritable Bowel Syndrome    pt here for follow up  . Medication Refill    on bentyl    HPI   IBS/change in bowel movement: she has a long history of IBS. She states usually constipation, however episodes of diarrhea, however over the past couple of months she has noticed diarrhea, multiple times daily with periods of no bowel movements over the past 2 months. Started shortly after she took a round of Z-pack and also have travelled abroad ( [place] about 6 weeks ago - mission's trip) She also has some bloating and eructation, no change in weight or appetite. She has some abdominal discomfort when she eats gluten. At times she has pain from bloating. No blood in stools or mucus, no fever. She states Bentyl helps. Last colonoscopy was 2008 and it was normal  DMII: she has neuropathy, she has been off DM medication for about one year. No longer necessary secondary to weight loss. She lost 80 lbs and weight has been stable since. She went on a modified paleo diet. She takes gabapentin for neuropathy and pain is controlled.   OSA: could never tolerate CPAP, she is aware of increase in risk of heart attacks and strokes  HTN: off medication since weight loss.   Hypothyroidism: taking Synthroid 125 mcg daily and half on Sundays. No weight loss, but has diarrhea.   Hyperlipidemia: unable to tolerate statin therapy, on life style modification only  Patient Active Problem List   Diagnosis Date Noted  . Recurrent oral herpes simplex infection 09/01/2015  . Irritable bowel syndrome with diarrhea 08/07/2015  . Statin intolerance 02/28/2015  . Airway hyperreactivity 09/10/2014  . Controlled type 2 diabetes with neuropathy ([place]) 09/10/2014  . Fibromyalgia 09/10/2014  . Obstructive sleep apnea 09/10/2014  . Fluttering heart ([place])  12/29/2012  . Hyperlipidemia 12/29/2012  . Essential hypertension 12/29/2012  . Morbid obesity ([place]) 12/29/2012  . Arthritis due to gout 12/13/2007  . Acid reflux 10/24/2006  . Adult hypothyroidism 10/24/2006  . Localized osteoarthrosis 10/24/2006    Past Surgical History:  Procedure Laterality Date  . BREAST BIOPSY Left    benign  . COLONOSCOPY     Dr Alveta Heimlich  . ESOPHAGOGASTRODUODENOSCOPY N/A 08/20/2014   Procedure: ESOPHAGOGASTRODUODENOSCOPY (EGD);  Surgeon: Christene Lye, MD;  Location: [facility] ENDOSCOPY;  Service: Endoscopy;  Laterality: N/A;  . FOOT SURGERY Left    heel spur  . HERNIA REPAIR     umbilical  . NASAL SINUS SURGERY  1980s  . UMBILICAL HERNIA REPAIR      Family History  Problem Relation Age of Onset  . Hypertension Mother   . Hyperlipidemia Mother   . Heart disease Brother 75    CABG x 3   . Heart attack Brother 58  . Lupus Sister     Social History   Social History  . Marital status: Married    Spouse name: N/A  . Number of children: N/A  . Years of education: N/A   Occupational History  . Not on file.   Social History Main Topics  . Smoking status: Never Smoker  . Smokeless tobacco: Never Used  . Alcohol use No     Comment: RARELY  . Drug use: No  . Sexual activity: No   Other  Topics Concern  . Not on file   Social History Narrative  . No narrative on file     Current Outpatient Prescriptions:  .  albuterol (PROVENTIL HFA;VENTOLIN HFA) 108 (90 BASE) MCG/ACT inhaler, Inhale 2 puffs into the lungs every 4 (four) hours as needed for wheezing or shortness of breath., Disp: 1 Inhaler, Rfl: 5 .  amitriptyline (ELAVIL) 25 MG tablet, Take 1 tablet (25 mg total) by mouth at bedtime., Disp: 30 tablet, Rfl: 0 .  clonazePAM (KLONOPIN) 0.5 MG tablet, Take 1 tablet (0.5 mg total) by mouth 3 (three) times daily as needed for anxiety., Disp: 30 tablet, Rfl: 0 .  dicyclomine (BENTYL) 20 MG tablet, , Disp: , Rfl:  .  gabapentin (NEURONTIN) 300 MG  capsule, Take 1 capsule (300 mg total) by mouth 3 (three) times daily., Disp: 270 capsule, Rfl: 1 .  levothyroxine (SYNTHROID, LEVOTHROID) 125 MCG tablet, Take 1 tablet by mouth  daily, Disp: 90 tablet, Rfl: 0 .  NON FORMULARY, Take 2 capsules by mouth daily. Micro[employer] Food Supplement, Disp: , Rfl:  .  NON FORMULARY, Take 2 capsules by mouth daily. Alpha CRS +, Disp: , Rfl:  .  omeprazole (PRILOSEC) 20 MG capsule, Take 1 capsule by mouth  daily, Disp: 90 capsule, Rfl: 0 .  OVER THE COUNTER MEDICATION, Take 1 capsule by mouth 2 (two) times daily. Deep blue polyphenol, Disp: , Rfl:  .  OVER THE COUNTER MEDICATION, Take 1 capsule by mouth 2 (two) times daily. DDR prime, Disp: , Rfl:  .  OVER THE COUNTER MEDICATION, Take 2 capsules by mouth every morning. XEO Mega, Disp: , Rfl:  .  OVER THE COUNTER MEDICATION, Take 1 capsule by mouth 2 (two) times daily. digestzen, Disp: , Rfl:  .  Spacer/Aero-Holding Chambers (E-Z SPACER) inhaler, Use as instructed, Disp: 1 each, Rfl: 1 .  valACYclovir (VALTREX) 1000 MG tablet, Take 1 tablet (1,000 mg total) by mouth 2 (two) times daily., Disp: 20 tablet, Rfl: 0  Allergies  Allergen Reactions  . Aspir-81 [Aspirin] Other (See Comments)    Coagulation disorder  . Fluzone [Flu Virus Vaccine]   . Lipitor [Atorvastatin] Other (See Comments)    Joint pain  . Statins Other (See Comments)  . Topamax [Topiramate] Other (See Comments)    Cognitive issues   . Vascepa [Epa Ethyl Ester] Swelling and Other (See Comments)  . Voltaren [Diclofenac Sodium] Hives and Other (See Comments)     ROS  Constitutional: Negative for fever or weight change.  Respiratory: Negative for cough and shortness of breath.   Cardiovascular: Negative for chest pain or palpitations.  Gastrointestinal: Positive  for abdominal pain, no bowel changes.  Musculoskeletal: Negative for gait problem or joint swelling.  Skin: Negative for rash.  Neurological: Negative for dizziness or headache.   No other specific complaints in a complete review of systems (except as listed in HPI above).  Objective  Vitals:   10/23/15 1040  BP: 120/68  Pulse: 83  Resp: 18  Temp: 99.2 F (37.3 C)  SpO2: 97%  Weight: 235 lb 7 oz (106.8 kg)  Height: 5\' 7"  (1.702 m)    Body mass index is 36.87 kg/m.  Physical Exam  Constitutional: Patient appears well-developed and well-nourished. Obese  No distress.  HEENT: head atraumatic, normocephalic, pupils equal and reactive to light,neck supple, throat within normal limits Cardiovascular: Normal rate, regular rhythm and normal heart sounds.  No murmur heard. No BLE edema. Pulmonary/Chest: Effort normal and breath sounds normal. No  respiratory distress. Abdominal: Soft.  There is no tenderness. Psychiatric: Patient has a normal mood and affect. behavior is normal. Judgment and thought content normal.  Diabetic Foot Exam: Diabetic Foot Exam - Simple   Simple Foot Form Diabetic Foot exam was performed with the following findings:  Yes 10/23/2015 11:57 AM  Visual Inspection No deformities, no ulcerations, no other skin breakdown bilaterally:  Yes Sensation Testing Intact to touch and monofilament testing bilaterally:  Yes Pulse Check Posterior Tibialis and Dorsalis pulse intact bilaterally:  Yes Comments      PHQ2/9: Depression screen [facility] 2/9 09/01/2015 08/07/2015 04/07/2015 02/27/2015 01/27/2015  Decreased Interest 0 0 0 0 0  Down, Depressed, Hopeless 0 0 0 0 0  PHQ - 2 Score 0 0 0 0 0     Fall Risk: Fall Risk  09/01/2015 08/07/2015 04/07/2015 02/27/2015 01/27/2015  Falls in the past year? No No No No No     Assessment & Plan  1. Controlled type 2 diabetes with neuropathy (HCC)  - Hemoglobin A1c - POCT UA - Microalbumin  2. Essential hypertension  - COMPLETE METABOLIC PANEL WITH GFR - CBC with Differential/Platelet  3. Irritable bowel syndrome with diarrhea  - amitriptyline (ELAVIL) 25 MG tablet; Take 1 tablet (25 mg total) by  mouth at bedtime.  Dispense: 30 tablet; Refill: 0  4. Other specified hypothyroidism  _TSH  5. Hyperlipidemia  - Lipid panel  6. Change in bowel movement  - CBC with Differential/Platelet - TSH - Ambulatory referral to Gastroenterology -DRG   7. Obstructive sleep apnea  Not compliant with CPAP   8. Controlled gout  - Uric acid  9. Need for hepatitis C screening test  - Hepatitis C antibody  10. Colon cancer screening  - Ambulatory referral to Gastroenterology

## 2015-10-24 LAB — COMPLETE METABOLIC PANEL WITH GFR
ALT: 15 U/L (ref 6–29)
AST: 19 U/L (ref 10–35)
Albumin: 3.8 g/dL (ref 3.6–5.1)
Alkaline Phosphatase: 63 U/L (ref 33–130)
BUN: 16 mg/dL (ref 7–25)
CO2: 25 mmol/L (ref 20–31)
Calcium: 9.1 mg/dL (ref 8.6–10.4)
Chloride: 107 mmol/L (ref 98–110)
Creat: 0.85 mg/dL (ref 0.50–0.99)
GFR, Est African American: 85 mL/min (ref >=60)
GFR, Est Non African American: 74 mL/min (ref >=60)
Glucose, Bld: 84 mg/dL (ref 65–99)
Potassium: 4.5 mmol/L (ref 3.5–5.3)
Sodium: 142 mmol/L (ref 135–146)
Total Bilirubin: 0.5 mg/dL (ref 0.2–1.2)
Total Protein: 6.4 g/dL (ref 6.1–8.1)

## 2015-10-24 LAB — HEPATITIS C ANTIBODY: HCV Ab: NEGATIVE

## 2015-10-24 LAB — URIC ACID: Uric Acid, Serum: 6.6 mg/dL (ref 2.5–7.0)

## 2015-10-24 LAB — LIPID PANEL
Cholesterol: 263 mg/dL — ABNORMAL HIGH (ref 125–200)
HDL: 43 mg/dL — ABNORMAL LOW (ref >=46)
LDL Cholesterol: 191 mg/dL — ABNORMAL HIGH (ref <130)
Total CHOL/HDL Ratio: 6.1 Ratio — ABNORMAL HIGH (ref <=5.0)
Triglycerides: 143 mg/dL (ref <150)
VLDL: 29 mg/dL (ref <30)

## 2015-10-24 LAB — HEMOGLOBIN A1C
Hgb A1c MFr Bld: 5.7 % — ABNORMAL HIGH (ref <5.7)
Mean Plasma Glucose: 117 mg/dL

## 2015-11-05 ENCOUNTER — Ambulatory Visit (INDEPENDENT_AMBULATORY_CARE_PROVIDER_SITE_OTHER): Payer: 59 | Admitting: General Surgery

## 2015-11-05 ENCOUNTER — Encounter: Payer: Self-pay | Admitting: General Surgery

## 2015-11-05 VITALS — BP 130/76 | HR 75 | Resp 12 | Ht 66.0 in | Wt 233.0 lb

## 2015-11-05 DIAGNOSIS — Z86010 Personal history of colonic polyps: Secondary | ICD-10-CM

## 2015-11-05 DIAGNOSIS — Z1211 Encounter for screening for malignant neoplasm of colon: Secondary | ICD-10-CM | POA: Diagnosis not present

## 2015-11-05 MED ORDER — POLYETHYLENE GLYCOL 3350 17 GM/SCOOP PO POWD: 1.0000 | Freq: Once | ORAL | 0 refills | Status: AC

## 2015-11-05 NOTE — Patient Instructions (Addendum)
Colonoscopy A colonoscopy is an exam to look at the entire large intestine (colon). This exam can help find problems such as tumors, polyps, inflammation, and areas of bleeding. The exam takes about 1 hour.  LET [facility] CARE PROVIDER KNOW ABOUT:   Any allergies you have.  All medicines you are taking, including vitamins, herbs, eye drops, creams, and over-the-counter medicines.  Previous problems you or members of your family have had with the use of anesthetics.  Any blood disorders you have.  Previous surgeries you have had.  Medical conditions you have. RISKS AND COMPLICATIONS  Generally, this is a safe procedure. However, as with any procedure, complications can occur. Possible complications include:  Bleeding.  Tearing or rupture of the colon wall.  Reaction to medicines given during the exam.  Infection (rare). BEFORE THE PROCEDURE   Ask your health care provider about changing or stopping your regular medicines.  You may be prescribed an oral bowel prep. This involves drinking a large amount of medicated liquid, starting the day before your procedure. The liquid will cause you to have multiple loose stools until your stool is almost clear or light green. This cleans out your colon in preparation for the procedure.  Do not eat or drink anything else once you have started the bowel prep, unless your health care provider tells you it is safe to do so.  Arrange for someone to drive you home after the procedure. PROCEDURE   You will be given medicine to help you relax (sedative).  You will lie on your side with your knees bent.  A long, flexible tube with a light and camera on the end (colonoscope) will be inserted through the rectum and into the colon. The camera sends video back to a computer screen as it moves through the colon. The colonoscope also releases carbon dioxide gas to inflate the colon. This helps your health care provider see the area better.  During  the exam, your health care provider may take a small tissue sample (biopsy) to be examined under a microscope if any abnormalities are found.  The exam is finished when the entire colon has been viewed. AFTER THE PROCEDURE   Do not drive for 24 hours after the exam.  You may have a small amount of blood in your stool.  You may pass moderate amounts of gas and have mild abdominal cramping or bloating. This is caused by the gas used to inflate your colon during the exam.  Ask when your test results will be ready and how you will get your results. Make sure you get your test results.   This information is not intended to replace advice given to you by your health care provider. Make sure you discuss any questions you have with your health care provider.   Document Released: 03/05/2000 Document Revised: 12/27/2012 Document Reviewed: 11/13/2012 Elsevier Interactive Patient Education [employer].  The patient is scheduled for a Colonoscopy at [facility] on 11/19/15. They are aware to call the day before to get their arrival time. Miralax prescription has been sent into the patient's pharmacy. The patient is aware of date and instructions.

## 2015-11-05 NOTE — Progress Notes (Addendum)
Patient ID: Sarah Gordon, female   DOB: 01-13-1951, 65 y.o.   MRN: [id number]  Chief Complaint  Patient presents with  . Colonoscopy    HPI Sarah Gordon is a 65 y.o. female here today for a evaluation of a colonoscopy. Last one was done in 2008 by Dr. Allen Norris. The first colonoscopy revealed an adenomatous polyp that was negative for malignancy. The following colonoscopy was normal. Patient has recently been experiencing painful gas and diarrhea - symptoms of IBS - and described symptoms similar to gluten intolerance. I have reviewed the history of present illness with the patient.  HPI  Past Medical History:  Diagnosis Date  . Asthma   . Esophageal reflux   . Gouty arthropathy, unspecified   . Herpes simplex without mention of complication   . Irritable bowel syndrome   . Localized osteoarthrosis not specified whether primary or secondary, unspecified site   . Migraine, unspecified, without mention of intractable migraine without mention of status migrainosus   . Mixed hyperlipidemia   . Myalgia and myositis, unspecified   . Obstructive sleep apnea (adult) (pediatric)   . Palpitations   . Type II or unspecified type diabetes mellitus without mention of complication, not stated as uncontrolled   . Umbilical hernia without mention of obstruction or gangrene   . Unspecified essential hypertension   . Unspecified hypothyroidism     Past Surgical History:  Procedure Laterality Date  . BREAST BIOPSY Left    benign  . COLONOSCOPY     Dr Alveta Heimlich  . ESOPHAGOGASTRODUODENOSCOPY N/A 08/20/2014   Procedure: ESOPHAGOGASTRODUODENOSCOPY (EGD);  Surgeon: Christene Lye, MD;  Location[facility] ENDOSCOPY;  Service: Endoscopy;  Laterality: N/A;  . FOOT SURGERY Left    heel spur  . HERNIA REPAIR     umbilical  . NASAL SINUS SURGERY  1980s  . UMBILICAL HERNIA REPAIR      Family History  Problem Relation Age of Onset  . Hypertension Mother   . Hyperlipidemia Mother   . Heart disease Brother  57    CABG x 3   . Heart attack Brother 47  . Lupus Sister     Social History Social History  Substance Use Topics  . Smoking status: Never Smoker  . Smokeless tobacco: Never Used  . Alcohol use No     Comment: RARELY    Allergies  Allergen Reactions  . Aspir-81 [Aspirin] Other (See Comments)    Coagulation disorder  . Fluzone [Flu Virus Vaccine]   . Lipitor [Atorvastatin] Other (See Comments)    Joint pain  . Statins Other (See Comments)  . Topamax [Topiramate] Other (See Comments)    Cognitive issues   . Vascepa [Epa Ethyl Ester] Swelling and Other (See Comments)  . Voltaren [Diclofenac Sodium] Hives and Other (See Comments)    No current facility-administered medications for this visit.    No current outpatient prescriptions on file.   Facility-Administered Medications Ordered in Other Visits  Medication Dose Route Frequency Provider Last Rate Last Dose  . 0.9 %  sodium chloride infusion   Intravenous Continuous Christene Lye, MD 20 mL/hr at 11/19/15 0822 1,000 mL at 11/19/15 [id number]    Review of Systems Review of Systems  Constitutional: Positive for unexpected weight change (80 lbs in the past year and a half, purposefully).  Respiratory: Negative.   Cardiovascular: Negative.   Gastrointestinal: Positive for abdominal pain, constipation and diarrhea. Negative for blood in stool.    Blood pressure 130/76, pulse 75, resp.  rate 12, height 5\' 6"  (1.676 m), weight 233 lb (105.7 kg).  Physical Exam Physical Exam  Constitutional: She is oriented to person, place, and time. She appears well-developed and well-nourished.  Eyes: Conjunctivae are normal. No scleral icterus.  Neck: Neck supple.  Cardiovascular: Normal rate, regular rhythm and normal heart sounds.   Pulmonary/Chest: Effort normal and breath sounds normal.  Abdominal: Soft. Normal appearance and bowel sounds are normal. There is generalized tenderness.  Lymphadenopathy:    She has no cervical  adenopathy.  Neurological: She is alert and oriented to person, place, and time.  Skin: Skin is warm and dry.    Data Reviewed Notes reviewed Pathology report  Assessment    Personal history of colon polyps. Recent abdominal symptoms of cramps and diarrhea off and on Surveillance colonoscopy warranted.    Plan Patient will be scheduled for a colonoscopy    Colonoscopy with possible biopsy/polypectomy prn: Information regarding the procedure, including its potential risks and complications (including but not limited to perforation of the bowel, which may require emergency surgery to repair, and bleeding) was verbally given to the patient. Educational information regarding lower intestinal endoscopy was given to the patient. Written instructions for how to complete the bowel prep using Miralax were provided. The importance of drinking ample fluids to avoid dehydration as a result of the prep emphasized.  The patient is scheduled for a Colonoscopy at [facility] on 11/19/15. They are aware to call the day before to get their arrival time. Miralax prescription has been sent into the patient's pharmacy. The patient is aware of date and instructions.    This information has been scribed by Gaspar Cola CMA.      SANKAR,SEEPLAPUTHUR G 11/19/2015, 8:30 AM

## 2015-11-13 ENCOUNTER — Other Ambulatory Visit: Payer: Self-pay | Admitting: Family Medicine

## 2015-11-17 ENCOUNTER — Ambulatory Visit (INDEPENDENT_AMBULATORY_CARE_PROVIDER_SITE_OTHER): Payer: 59 | Admitting: Family Medicine

## 2015-11-17 ENCOUNTER — Encounter: Payer: Self-pay | Admitting: Family Medicine

## 2015-11-17 VITALS — BP 124/76 | HR 111 | Temp 98.2°F | Resp 18 | Ht 67.0 in | Wt 236.6 lb

## 2015-11-17 DIAGNOSIS — A073 Isosporiasis: Secondary | ICD-10-CM

## 2015-11-17 DIAGNOSIS — A078 Other specified protozoal intestinal diseases: Secondary | ICD-10-CM

## 2015-11-17 DIAGNOSIS — H109 Unspecified conjunctivitis: Secondary | ICD-10-CM

## 2015-11-17 MED ORDER — METRONIDAZOLE 500 MG PO TABS: 500.0000 mg | ORAL_TABLET | Freq: Three times a day (TID) | ORAL | 0 refills | Status: DC

## 2015-11-17 MED ORDER — CIPROFLOXACIN HCL 0.3 % OP SOLN: 2.0000 [drp] | OPHTHALMIC | 0 refills | Status: DC

## 2015-11-17 NOTE — Addendum Note (Signed)
Addended by: Steele Sizer F on: 11/17/2015 12:55 PM   Modules accepted: Orders

## 2015-11-17 NOTE — Progress Notes (Addendum)
Name: Sarah Gordon   MRN: [id number]    DOB: 1950-04-19   Date:11/17/2015       Progress Note  Subjective  Chief Complaint  Chief Complaint  Patient presents with  . Eye Drainage    Patient start experiencing blurred vision yesterday with redness in her left eye. Patient states it is itchy, tender, sore, and started experiencing drainage today as well.     HPI  Left eye problem: she states she was doing well all day, went to church and in the evening she noticed redness and pruritis on left eye, associated with some discomfort, she went to bed and woke up this morning with some yellow drainage on left eye, continues to have some soreness, mild change in visual acuity ( when only using left eye ) and swelling on upper eye lid. No fever, cold symptoms or double vision   Patient Active Problem List   Diagnosis Date Noted  . Recurrent oral herpes simplex infection 09/01/2015  . Irritable bowel syndrome with diarrhea 08/07/2015  . Statin intolerance 02/28/2015  . Airway hyperreactivity 09/10/2014  . Controlled type 2 diabetes with neuropathy ([place]) 09/10/2014  . Fibromyalgia 09/10/2014  . Obstructive sleep apnea 09/10/2014  . Fluttering heart ([place]) 12/29/2012  . Hyperlipidemia 12/29/2012  . Essential hypertension 12/29/2012  . Morbid obesity ([place]) 12/29/2012  . Arthritis due to gout 12/13/2007  . Acid reflux 10/24/2006  . Adult hypothyroidism 10/24/2006  . Localized osteoarthrosis 10/24/2006    Past Surgical History:  Procedure Laterality Date  . BREAST BIOPSY Left    benign  . COLONOSCOPY     Dr Alveta Heimlich  . ESOPHAGOGASTRODUODENOSCOPY N/A 08/20/2014   Procedure: ESOPHAGOGASTRODUODENOSCOPY (EGD);  Surgeon: Christene Lye, MD;  Location: [facility] ENDOSCOPY;  Service: Endoscopy;  Laterality: N/A;  . FOOT SURGERY Left    heel spur  . HERNIA REPAIR     umbilical  . NASAL SINUS SURGERY  1980s  . UMBILICAL HERNIA REPAIR      Family History  Problem Relation Age of Onset  .  Hypertension Mother   . Hyperlipidemia Mother   . Heart disease Brother 54    CABG x 3   . Heart attack Brother 60  . Lupus Sister     Social History   Social History  . Marital status: Married    Spouse name: N/A  . Number of children: N/A  . Years of education: N/A   Occupational History  . Not on file.   Social History Main Topics  . Smoking status: Never Smoker  . Smokeless tobacco: Never Used  . Alcohol use No     Comment: RARELY  . Drug use: No  . Sexual activity: No   Other Topics Concern  . Not on file   Social History Narrative  . No narrative on file     Current Outpatient Prescriptions:  .  albuterol (PROVENTIL HFA;VENTOLIN HFA) 108 (90 BASE) MCG/ACT inhaler, Inhale 2 puffs into the lungs every 4 (four) hours as needed for wheezing or shortness of breath., Disp: 1 Inhaler, Rfl: 5 .  amitriptyline (ELAVIL) 25 MG tablet, Take 1 tablet (25 mg total) by mouth at bedtime., Disp: 30 tablet, Rfl: 0 .  clonazePAM (KLONOPIN) 0.5 MG tablet, Take 1 tablet (0.5 mg total) by mouth 3 (three) times daily as needed for anxiety., Disp: 30 tablet, Rfl: 0 .  dicyclomine (BENTYL) 20 MG tablet, TAKE ONE TABLET BY MOUTH 4 TIMES DAILY *BEFORE  MEALS  AND  AT  BEDTIME*, Disp: 120 tablet, Rfl: 0 .  gabapentin (NEURONTIN) 300 MG capsule, Take 1 capsule (300 mg total) by mouth 3 (three) times daily., Disp: 270 capsule, Rfl: 1 .  levothyroxine (SYNTHROID, LEVOTHROID) 125 MCG tablet, Take 1 tablet by mouth  daily, Disp: 90 tablet, Rfl: 1 .  NON FORMULARY, Take 2 capsules by mouth daily. [employer] Food Supplement, Disp: , Rfl:  .  NON FORMULARY, Take 2 capsules by mouth daily. Alpha CRS +, Disp: , Rfl:  .  omeprazole (PRILOSEC) 20 MG capsule, Take 1 capsule by mouth  daily, Disp: 90 capsule, Rfl: 0 .  OVER THE COUNTER MEDICATION, Take 1 capsule by mouth 2 (two) times daily. Deep blue polyphenol, Disp: , Rfl:  .  OVER THE COUNTER MEDICATION, Take 1 capsule by mouth 2 (two) times daily. DDR  prime, Disp: , Rfl:  .  OVER THE COUNTER MEDICATION, Take 2 capsules by mouth every morning. XEO Mega, Disp: , Rfl:  .  OVER THE COUNTER MEDICATION, Take 1 capsule by mouth 2 (two) times daily. digestzen, Disp: , Rfl:  .  polyethylene glycol powder (GLYCOLAX/MIRALAX) powder, , Disp: , Rfl:  .  Spacer/Aero-Holding Chambers (E-Z SPACER) inhaler, Use as instructed, Disp: 1 each, Rfl: 1 .  valACYclovir (VALTREX) 1000 MG tablet, Take 1 tablet (1,000 mg total) by mouth 2 (two) times daily., Disp: 20 tablet, Rfl: 0 .  ciprofloxacin (CILOXAN) 0.3 % ophthalmic solution, Place 2 drops into both eyes every 2 (two) hours. Administer 1 drop, every 2 hours, while awake, for 2 days. Then 1 drop, every 4 hours, while awake, for the next 5 days., Disp: 5 mL, Rfl: 0  Allergies  Allergen Reactions  . Aspir-81 [Aspirin] Other (See Comments)    Coagulation disorder  . Fluzone [Flu Virus Vaccine]   . Lipitor [Atorvastatin] Other (See Comments)    Joint pain  . Statins Other (See Comments)  . Topamax [Topiramate] Other (See Comments)    Cognitive issues   . Vascepa [Epa Ethyl Ester] Swelling and Other (See Comments)  . Voltaren [Diclofenac Sodium] Hives and Other (See Comments)     ROS  Ten systems reviewed and is negative except as mentioned in HPI  Still having diarrhea, intermittently, seeing Dr. Jamal Collin for colonoscopy in 2 days  Objective  Vitals:   11/17/15 1206  BP: 124/76  Pulse: (!) 111  Resp: 18  Temp: 98.2 F (36.8 C)  TempSrc: Oral  SpO2: 96%  Weight: 236 lb 9.6 oz (107.3 kg)  Height: _0  (1.702 m)    Body mass index is 37.06 kg/m.  Physical Exam  Constitutional: Patient appears well-developed and well-nourished. Obese No distress.  HEENT: head atraumatic, normocephalic, pupils equal and reactive to light, left conjunctiva injection of left eye, normal iris, mild swelling and redness of left upper eye-lid neck supple, throat within normal limits Cardiovascular: Normal rate,  regular rhythm and normal heart sounds.  No murmur heard. No BLE edema. Pulmonary/Chest: Effort normal and breath sounds normal. No respiratory distress. Abdominal: Soft.  There is no tenderness. Psychiatric: Patient has a normal mood and affect. behavior is normal. Judgment and thought content normal.  Recent Results (from the past 2160 hour(s))  Lipid panel     Status: Abnormal   Collection Time: 10/23/15 12:02 PM  Result Value Ref Range   Cholesterol 263 (H) 125 - 200 mg/dL   Triglycerides 143 <150 mg/dL   HDL 43 (L) >=46 mg/dL   Total CHOL/HDL Ratio 6.1 (H) <=5.0 Ratio  VLDL 29 <30 mg/dL   LDL Cholesterol 191 (H) <130 mg/dL    Comment:   Total Cholesterol/HDL Ratio:CHD Risk                        Coronary Heart Disease Risk Table                                        Men       Women          1/2 Average Risk              3.4        3.3              Average Risk              5.0        4.4           2X Average Risk              9.6        7.1           3X Average Risk             23.4       11.0 Use the calculated Patient Ratio above and the CHD Risk table  to determine the patient's CHD Risk.   COMPLETE METABOLIC PANEL WITH GFR     Status: None   Collection Time: 10/23/15 12:02 PM  Result Value Ref Range   Sodium 142 135 - 146 mmol/L   Potassium 4.5 3.5 - 5.3 mmol/L   Chloride 107 98 - 110 mmol/L   CO2 25 20 - 31 mmol/L   Glucose, Bld 84 65 - 99 mg/dL   BUN 16 7 - 25 mg/dL   Creat 0.85 0.50 - 0.99 mg/dL    Comment:   For patients > or = 63 years of age: The upper reference limit for Creatinine is approximately 13% higher for people identified as African-American.      Total Bilirubin 0.5 0.2 - 1.2 mg/dL   Alkaline Phosphatase 63 33 - 130 U/L   AST 19 10 - 35 U/L   ALT 15 6 - 29 U/L   Total Protein 6.4 6.1 - 8.1 g/dL   Albumin 3.8 3.6 - 5.1 g/dL   Calcium 9.1 8.6 - 10.4 mg/dL   GFR, Est African American 85 >=60 mL/min   GFR, Est Non African American 74 >=60  mL/min  Uric acid     Status: None   Collection Time: 10/23/15 12:02 PM  Result Value Ref Range   Uric Acid, Serum 6.6 2.5 - 7.0 mg/dL    Comment: Therapeutic target for gout patients: <6.0 mg/dL  Hemoglobin A1c     Status: Abnormal   Collection Time: 10/23/15 12:02 PM  Result Value Ref Range   Hgb A1c MFr Bld 5.7 (H) <5.7 %    Comment:   For someone without known diabetes, a hemoglobin A1c value between 5.7% and 6.4% is consistent with prediabetes and should be confirmed with a follow-up test.   For someone with known diabetes, a value <7% indicates that their diabetes is well controlled. A1c targets should be individualized based on duration of diabetes, age, co-morbid conditions and other considerations.   This assay result is consistent with an increased risk of diabetes.  Currently, no consensus exists regarding use of hemoglobin A1c for diagnosis of diabetes in children.      Mean Plasma Glucose 117 mg/dL  Hepatitis C antibody     Status: None   Collection Time: 10/23/15 12:02 PM  Result Value Ref Range   HCV Ab NEGATIVE NEGATIVE  CBC with Differential/Platelet     Status: Abnormal   Collection Time: 10/23/15 12:02 PM  Result Value Ref Range   WBC 3.9 3.8 - 10.8 K/uL   RBC 4.43 3.80 - 5.10 MIL/uL   Hemoglobin 13.8 11.7 - 15.5 g/dL   HCT 41.7 35.0 - 45.0 %   MCV 94.1 80.0 - 100.0 fL   MCH 31.2 27.0 - 33.0 pg   MCHC 33.1 32.0 - 36.0 g/dL   RDW 13.4 11.0 - 15.0 %   Platelets 219 140 - 400 K/uL   MPV 10.2 7.5 - 12.5 fL   Neutro Abs 2,340 1,500 - 7,800 cells/uL   Lymphs Abs 1,131 850 - 3,900 cells/uL   Monocytes Absolute 195 (L) 200 - 950 cells/uL   Eosinophils Absolute 195 15 - 500 cells/uL   Basophils Absolute 39 0 - 200 cells/uL   Neutrophils Relative % 60 %   Lymphocytes Relative 29 %   Monocytes Relative 5 %   Eosinophils Relative 5 %   Basophils Relative 1 %   Smear Review Criteria for review not met     Comment: ** Please note change in unit of measure  and reference range(s). **  TSH     Status: None   Collection Time: 10/23/15 12:02 PM  Result Value Ref Range   TSH 1.13 mIU/L    Comment:   Reference Range   > or = 20 Years  0.40-4.50   Pregnancy Range First trimester  0.26-2.66 Second trimester 0.55-2.73 Third trimester  0.43-2.91       PHQ2/9: Depression screen [facility] 2/9 11/17/2015 09/01/2015 08/07/2015 04/07/2015 02/27/2015  Decreased Interest 0 0 0 0 0  Down, Depressed, Hopeless 0 0 0 0 0  PHQ - 2 Score 0 0 0 0 0     Fall Risk: Fall Risk  11/17/2015 09/01/2015 08/07/2015 04/07/2015 02/27/2015  Falls in the past year? _0      Functional Status Survey: Is the patient deaf or have difficulty hearing?: No Does the patient have difficulty seeing, even when wearing glasses/contacts?: No Does the patient have difficulty concentrating, remembering, or making decisions?: No Does the patient have difficulty walking or climbing stairs?: No Does the patient have difficulty dressing or bathing?: No Does the patient have difficulty doing errands alone such as visiting a doctor's office or shopping?: No    Assessment & Plan  1. Conjunctivitis, left eye  Explained that it may also be sty or early cellulitis and if it spreads, she needs to go to [facility] - ciprofloxacin (CILOXAN) 0.3 % ophthalmic solution; Place 2 drops into both eyes every 2 (two) hours. Administer 1 drop, every 2 hours, while awake, for 2 days. Then 1 drop, every 4 hours, while awake, for the next 5 days.  Dispense: 5 mL; Refill: 0  2. Blastocystis hominis infection  - metroNIDAZOLE (FLAGYL) 500 MG tablet; Take 1 tablet (500 mg total) by mouth 3 (three) times daily.  Dispense: 21 tablet; Refill: 0

## 2015-11-18 ENCOUNTER — Other Ambulatory Visit: Payer: Self-pay

## 2015-11-18 DIAGNOSIS — E0843 Diabetes mellitus due to underlying condition with diabetic autonomic (poly)neuropathy: Secondary | ICD-10-CM

## 2015-11-18 MED ORDER — GABAPENTIN 300 MG PO CAPS: 300.0000 mg | ORAL_CAPSULE | Freq: Three times a day (TID) | ORAL | 1 refills | Status: DC

## 2015-11-18 NOTE — Telephone Encounter (Signed)
Patient requesting refill of Neurontin be sent to [facility].

## 2015-11-19 ENCOUNTER — Encounter
Admission: RE | Disposition: A | Discharge status: Home / Self Care | Payer: Self-pay | Source: Ambulatory Visit | Attending: General Surgery

## 2015-11-19 ENCOUNTER — Encounter: Payer: Self-pay | Admitting: *Deleted

## 2015-11-19 ENCOUNTER — Ambulatory Visit: Payer: 59 | Admitting: Anesthesiology

## 2015-11-19 ENCOUNTER — Ambulatory Visit
Admission: RE | Admit: 2015-11-19 | Discharge: 2015-11-19 | Disposition: A | Discharge status: Home / Self Care | Payer: 59 | Source: Ambulatory Visit | Attending: General Surgery | Admitting: General Surgery

## 2015-11-19 DIAGNOSIS — K219 Gastro-esophageal reflux disease without esophagitis: Secondary | ICD-10-CM | POA: Insufficient documentation

## 2015-11-19 DIAGNOSIS — Z1211 Encounter for screening for malignant neoplasm of colon: Secondary | ICD-10-CM | POA: Diagnosis present

## 2015-11-19 DIAGNOSIS — M797 Fibromyalgia: Secondary | ICD-10-CM | POA: Insufficient documentation

## 2015-11-19 DIAGNOSIS — M1990 Unspecified osteoarthritis, unspecified site: Secondary | ICD-10-CM | POA: Diagnosis not present

## 2015-11-19 DIAGNOSIS — G4730 Sleep apnea, unspecified: Secondary | ICD-10-CM | POA: Diagnosis not present

## 2015-11-19 DIAGNOSIS — Z86010 Personal history of colonic polyps: Secondary | ICD-10-CM | POA: Insufficient documentation

## 2015-11-19 DIAGNOSIS — J45909 Unspecified asthma, uncomplicated: Secondary | ICD-10-CM | POA: Diagnosis not present

## 2015-11-19 DIAGNOSIS — I10 Essential (primary) hypertension: Secondary | ICD-10-CM | POA: Insufficient documentation

## 2015-11-19 HISTORY — PX: COLONOSCOPY WITH PROPOFOL: SHX5780

## 2015-11-19 SURGERY — COLONOSCOPY WITH PROPOFOL
Anesthesia: General

## 2015-11-19 MED ORDER — PROPOFOL 10 MG/ML IV BOLUS
INTRAVENOUS | Status: DC | PRN
  Administered 2015-11-19: 100 mg via INTRAVENOUS

## 2015-11-19 MED ORDER — SODIUM CHLORIDE 0.9 % IV SOLN
INTRAVENOUS | Status: DC
  Administered 2015-11-19: 1000 mL via INTRAVENOUS

## 2015-11-19 MED ORDER — PROPOFOL 500 MG/50ML IV EMUL
INTRAVENOUS | Status: DC | PRN
  Administered 2015-11-19: 140 ug/kg/min via INTRAVENOUS

## 2015-11-19 NOTE — Anesthesia Postprocedure Evaluation (Signed)
Anesthesia Post Note  Patient: Sarah Gordon  Procedure(s) Performed: Procedure(s) (LRB): COLONOSCOPY WITH PROPOFOL (N/A)  Patient location during evaluation: Endoscopy Anesthesia Type: General Level of consciousness: awake and alert Pain management: pain level controlled Vital Signs Assessment: post-procedure vital signs reviewed and stable Respiratory status: spontaneous breathing and respiratory function stable Cardiovascular status: stable Anesthetic complications: no    Last Vitals:  Vitals:   11/19/15 0920 11/19/15 0930  BP: 102/74 110/80  Pulse:    Resp:    Temp:      Last Pain:  Vitals:   11/19/15 0910  TempSrc: Tympanic                 Mylo Choi K

## 2015-11-19 NOTE — H&P (View-Only) (Signed)
Patient ID: Sarah Gordon, female   DOB: 01-13-1951, 65 y.o.   MRN: [id number]  Chief Complaint  Patient presents with  . Colonoscopy    HPI Sarah Gordon is a 65 y.o. female here today for a evaluation of a colonoscopy. Last one was done in 2008 by Dr. Allen Norris. The first colonoscopy revealed an adenomatous polyp that was negative for malignancy. The following colonoscopy was normal. Patient has recently been experiencing painful gas and diarrhea - symptoms of IBS - and described symptoms similar to gluten intolerance. I have reviewed the history of present illness with the patient.  HPI  Past Medical History:  Diagnosis Date  . Asthma   . Esophageal reflux   . Gouty arthropathy, unspecified   . Herpes simplex without mention of complication   . Irritable bowel syndrome   . Localized osteoarthrosis not specified whether primary or secondary, unspecified site   . Migraine, unspecified, without mention of intractable migraine without mention of status migrainosus   . Mixed hyperlipidemia   . Myalgia and myositis, unspecified   . Obstructive sleep apnea (adult) (pediatric)   . Palpitations   . Type II or unspecified type diabetes mellitus without mention of complication, not stated as uncontrolled   . Umbilical hernia without mention of obstruction or gangrene   . Unspecified essential hypertension   . Unspecified hypothyroidism     Past Surgical History:  Procedure Laterality Date  . BREAST BIOPSY Left    benign  . COLONOSCOPY     Dr Alveta Heimlich  . ESOPHAGOGASTRODUODENOSCOPY N/A 08/20/2014   Procedure: ESOPHAGOGASTRODUODENOSCOPY (EGD);  Surgeon: Christene Lye, MD;  Location: [facility] ENDOSCOPY;  Service: Endoscopy;  Laterality: N/A;  . FOOT SURGERY Left    heel spur  . HERNIA REPAIR     umbilical  . NASAL SINUS SURGERY  1980s  . UMBILICAL HERNIA REPAIR      Family History  Problem Relation Age of Onset  . Hypertension Mother   . Hyperlipidemia Mother   . Heart disease Brother  57    CABG x 3   . Heart attack Brother 47  . Lupus Sister     Social History Social History  Substance Use Topics  . Smoking status: Never Smoker  . Smokeless tobacco: Never Used  . Alcohol use No     Comment: RARELY    Allergies  Allergen Reactions  . Aspir-81 [Aspirin] Other (See Comments)    Coagulation disorder  . Fluzone [Flu Virus Vaccine]   . Lipitor [Atorvastatin] Other (See Comments)    Joint pain  . Statins Other (See Comments)  . Topamax [Topiramate] Other (See Comments)    Cognitive issues   . Vascepa [Epa Ethyl Ester] Swelling and Other (See Comments)  . Voltaren [Diclofenac Sodium] Hives and Other (See Comments)    No current facility-administered medications for this visit.    No current outpatient prescriptions on file.   Facility-Administered Medications Ordered in Other Visits  Medication Dose Route Frequency Provider Last Rate Last Dose  . 0.9 %  sodium chloride infusion   Intravenous Continuous Christene Lye, MD 20 mL/hr at 11/19/15 0822 1,000 mL at 11/19/15 [id number]    Review of Systems Review of Systems  Constitutional: Positive for unexpected weight change (80 lbs in the past year and a half, purposefully).  Respiratory: Negative.   Cardiovascular: Negative.   Gastrointestinal: Positive for abdominal pain, constipation and diarrhea. Negative for blood in stool.    Blood pressure 130/76, pulse 75, resp.  rate 12, height 5\' 6"  (1.676 m), weight 233 lb (105.7 kg).  Physical Exam Physical Exam  Constitutional: She is oriented to person, place, and time. She appears well-developed and well-nourished.  Eyes: Conjunctivae are normal. No scleral icterus.  Neck: Neck supple.  Cardiovascular: Normal rate, regular rhythm and normal heart sounds.   Pulmonary/Chest: Effort normal and breath sounds normal.  Abdominal: Soft. Normal appearance and bowel sounds are normal. There is generalized tenderness.  Lymphadenopathy:    She has no cervical  adenopathy.  Neurological: She is alert and oriented to person, place, and time.  Skin: Skin is warm and dry.    Data Reviewed Notes reviewed Pathology report  Assessment    Personal history of colon polyps. Recent abdominal symptoms of cramps and diarrhea off and on Surveillance colonoscopy warranted.    Plan Patient will be scheduled for a colonoscopy    Colonoscopy with possible biopsy/polypectomy prn: Information regarding the procedure, including its potential risks and complications (including but not limited to perforation of the bowel, which may require emergency surgery to repair, and bleeding) was verbally given to the patient. Educational information regarding lower intestinal endoscopy was given to the patient. Written instructions for how to complete the bowel prep using Miralax were provided. The importance of drinking ample fluids to avoid dehydration as a result of the prep emphasized.  The patient is scheduled for a Colonoscopy at [facility] on 11/19/15. They are aware to call the day before to get their arrival time. Miralax prescription has been sent into the patient's pharmacy. The patient is aware of date and instructions.    This information has been scribed by Gaspar Cola CMA.      Meranda Dechaine G 11/19/2015, 8:30 AM

## 2015-11-19 NOTE — Op Note (Signed)
[facility] Gastroenterology Patient Name: Sarah Gordon Procedure Date: 11/19/2015 8:35 AM MRN: [id number] Account #: [id number] Date of Birth: 05/10/1950 Admit Type: Outpatient Age: 65 Room: [facility] ENDO ROOM 3 Gender: Female Note Status: Finalized Procedure:            Colonoscopy Indications:          High risk colon cancer surveillance: Personal history                        of colonic polyps Providers:            Gursimran Litaker G. Evette Cristal, MD Referring MD:         Dennison Mascot, MD (Referring MD) Medicines:            General Anesthesia Complications:        No immediate complications. Procedure:            Pre-Anesthesia Assessment:                       - General anesthesia under the supervision of an                        anesthesiologist was determined to be medically                        necessary for this procedure based on review of the                        patient's medical history, medications, and prior                        anesthesia history.                       After obtaining informed consent, the colonoscope was                        passed under direct vision. Throughout the procedure,                        the patient's blood pressure, pulse, and oxygen                        saturations were monitored continuously. The                        Colonoscope was introduced through the anus and                        advanced to the the cecum, identified by the ileocecal                        valve. The colonoscopy was performed without                        difficulty. The patient tolerated the procedure well.                        The quality of the bowel preparation was adequate to  identify polyps 6 mm and larger in size. Findings:      The perianal and digital rectal examinations were normal.      The entire examined colon appeared normal on direct and retroflexion       views. Impression:           - The  entire examined colon is normal on direct and                        retroflexion views.                       - No specimens collected. Recommendation:       - Discharge patient to home.                       - Repeat colonoscopy in 5 years for surveillance. Procedure Code(s):    --- Professional ---                       [id number], Colonoscopy, flexible; diagnostic, including                        collection of specimen(s) by brushing or washing, when                        performed (separate procedure) Diagnosis Code(s):    --- Professional ---                       Z86.010, Personal history of colonic polyps CPT copyright 2016 [employer] Association. All rights reserved. The codes documented in this report are preliminary and upon coder review may  be revised to meet current compliance requirements. Kieth Brightly, MD 11/19/2015 9:12:10 AM This report has been signed electronically. Number of Addenda: 0 Note Initiated On: 11/19/2015 8:35 AM Scope Withdrawal Time: 0 hours 7 minutes 18 seconds  Total Procedure Duration: 0 hours 27 minutes 1 second       [facility]

## 2015-11-19 NOTE — Transfer of Care (Signed)
Immediate Anesthesia Transfer of Care Note  Patient: Sarah Gordon  Procedure(s) Performed: Procedure(s): COLONOSCOPY WITH PROPOFOL (N/A)  Patient Location: PACU and Endoscopy Unit  Anesthesia Type:General  Level of Consciousness: sedated and responds to stimulation  Airway & Oxygen Therapy: Patient Spontanous Breathing and Patient connected to nasal cannula oxygen  Post-op Assessment: Report given to RN and Post -op Vital signs reviewed and stable  Post vital signs: Reviewed  Last Vitals:  Vitals:   11/19/15 0758 11/19/15 0915  BP: (!) 142/91 104/74  Pulse: 87 77  Resp: 18   Temp: 36.7 C 36.2 C    Last Pain:  Vitals:   11/19/15 0758  TempSrc: Tympanic         Complications: No apparent anesthesia complications

## 2015-11-19 NOTE — Interval H&P Note (Signed)
History and Physical Interval Note:  11/19/2015 8:30 AM  Sarah Gordon  has presented today for surgery, with the diagnosis of HX COLON POLYPS  The various methods of treatment have been discussed with the patient and family. After consideration of risks, benefits and other options for treatment, the patient has consented to  Procedure(s): COLONOSCOPY WITH PROPOFOL (N/A) as a surgical intervention .  The patient's history has been reviewed, patient examined, no change in status, stable for surgery.  I have reviewed the patient's chart and labs.  Questions were answered to the patient's satisfaction.     Christopher Glasscock G

## 2015-11-19 NOTE — Anesthesia Preprocedure Evaluation (Signed)
Anesthesia Evaluation  Patient identified by MRN, date of birth, ID band Patient awake    Reviewed: Allergy & Precautions, NPO status , Patient's Chart, lab work & pertinent test results  History of Anesthesia Complications Negative for: history of anesthetic complications  Airway Mallampati: II       Dental   Pulmonary asthma , sleep apnea (not using CPAP) ,           Cardiovascular hypertension (off meds, since losing weight),      Neuro/Psych  Headaches,    GI/Hepatic GERD  Medicated,  Endo/Other  diabetes, Type 2Hypothyroidism   Renal/GU      Musculoskeletal  (+) Arthritis , Osteoarthritis,  Fibromyalgia -  Abdominal   Peds  Hematology   Anesthesia Other Findings   Reproductive/Obstetrics                             Anesthesia Physical Anesthesia Plan  ASA: III  Anesthesia Plan: General   Post-op Pain Management:    Induction: Intravenous  Airway Management Planned: Nasal Cannula  Additional Equipment:   Intra-op Plan:   Post-operative Plan:   Informed Consent: I have reviewed the patients History and Physical, chart, labs and discussed the procedure including the risks, benefits and alternatives for the proposed anesthesia with the patient or authorized representative who has indicated his/her understanding and acceptance.     Plan Discussed with:   Anesthesia Plan Comments:         Anesthesia Quick Evaluation

## 2015-11-20 ENCOUNTER — Encounter: Payer: Self-pay | Admitting: General Surgery

## 2015-12-24 ENCOUNTER — Other Ambulatory Visit: Payer: Self-pay | Admitting: Emergency Medicine

## 2015-12-24 MED ORDER — OMEPRAZOLE 20 MG PO CPDR: DELAYED_RELEASE_CAPSULE | ORAL | 0 refills | Status: DC

## 2016-01-23 ENCOUNTER — Ambulatory Visit (INDEPENDENT_AMBULATORY_CARE_PROVIDER_SITE_OTHER): Payer: 59 | Admitting: Family Medicine

## 2016-01-23 ENCOUNTER — Encounter: Payer: Self-pay | Admitting: Family Medicine

## 2016-01-23 VITALS — BP 136/92 | HR 92 | Temp 97.7°F | Resp 16 | Ht 66.25 in | Wt 247.4 lb

## 2016-01-23 DIAGNOSIS — M5030 Other cervical disc degeneration, unspecified cervical region: Secondary | ICD-10-CM | POA: Diagnosis not present

## 2016-01-23 DIAGNOSIS — M5412 Radiculopathy, cervical region: Secondary | ICD-10-CM | POA: Diagnosis not present

## 2016-01-23 DIAGNOSIS — B0089 Other herpesviral infection: Secondary | ICD-10-CM | POA: Diagnosis not present

## 2016-01-23 DIAGNOSIS — M62838 Other muscle spasm: Secondary | ICD-10-CM

## 2016-01-23 DIAGNOSIS — H00014 Hordeolum externum left upper eyelid: Secondary | ICD-10-CM | POA: Diagnosis not present

## 2016-01-23 DIAGNOSIS — M19012 Primary osteoarthritis, left shoulder: Secondary | ICD-10-CM | POA: Diagnosis not present

## 2016-01-23 MED ORDER — VALACYCLOVIR HCL 1 G PO TABS: 1000.0000 mg | ORAL_TABLET | Freq: Two times a day (BID) | ORAL | 0 refills | Status: DC | PRN

## 2016-01-23 MED ORDER — PREDNISONE 10 MG (48) PO TBPK: ORAL_TABLET | Freq: Every day | ORAL | 0 refills | Status: DC

## 2016-01-23 MED ORDER — METAXALONE 800 MG PO TABS: 800.0000 mg | ORAL_TABLET | Freq: Three times a day (TID) | ORAL | 0 refills | Status: DC

## 2016-01-23 NOTE — Patient Instructions (Signed)
Stop taking all NSAIDS ( Aleve/motrin) Take prednisone with food and not right before bed

## 2016-01-23 NOTE — Progress Notes (Signed)
Name: Sarah Gordon   MRN: [id number]    DOB: February 14, 1951   Date:01/23/2016       Progress Note  Subjective  Chief Complaint  Chief Complaint  Patient presents with  . Shoulder Pain    Onset-1 month, intermittent pain in right shoulder and radiates to back of neck. Patient states if she raises her hands it will just ache and has been taking Aleve for pain control.   . Fever Blister    Needs refill on medication    HPI  Neck pain and right arm pain: symptoms started about one month ago, intermittent however it is getting worse. She has a history of right AC degenerative disease and also C-spine DDD of C5-6, C6-7 found on X-ray. Pain is described as aching but at times intense and radiates down to right hand. No weakness or numbness. Worse when clapping or raising hands for worship. She also has noticed that pain is also triggered when driving.   Fever blister: she needs a refill of medication, outbreaks are about once a month  Patient Active Problem List   Diagnosis Date Noted  . DDD (degenerative disc disease), cervical 01/23/2016  . Degenerative joint disease of left acromioclavicular joint 01/23/2016  . Recurrent oral herpes simplex infection 09/01/2015  . Irritable bowel syndrome with diarrhea 08/07/2015  . Statin intolerance 02/28/2015  . Airway hyperreactivity 09/10/2014  . Controlled type 2 diabetes with neuropathy ([place]) 09/10/2014  . Fibromyalgia 09/10/2014  . Obstructive sleep apnea 09/10/2014  . Fluttering heart 12/29/2012  . Hyperlipidemia 12/29/2012  . Essential hypertension 12/29/2012  . Morbid obesity ([place]) 12/29/2012  . Arthritis due to gout 12/13/2007  . Acid reflux 10/24/2006  . Adult hypothyroidism 10/24/2006  . Localized osteoarthrosis 10/24/2006    Past Surgical History:  Procedure Laterality Date  . BREAST BIOPSY Left    benign  . COLONOSCOPY     Dr Alveta Heimlich  . COLONOSCOPY WITH PROPOFOL N/A 11/19/2015   Procedure: COLONOSCOPY WITH PROPOFOL;  Surgeon:  Christene Lye, MD;  Location: [facility] ENDOSCOPY;  Service: Endoscopy;  Laterality: N/A;  . ESOPHAGOGASTRODUODENOSCOPY N/A 08/20/2014   Procedure: ESOPHAGOGASTRODUODENOSCOPY (EGD);  Surgeon: Christene Lye, MD;  Location: [facility] ENDOSCOPY;  Service: Endoscopy;  Laterality: N/A;  . FOOT SURGERY Left    heel spur  . HERNIA REPAIR     umbilical  . NASAL SINUS SURGERY  1980s  . UMBILICAL HERNIA REPAIR      Family History  Problem Relation Age of Onset  . Hypertension Mother   . Hyperlipidemia Mother   . Heart disease Brother 69    CABG x 3   . Heart attack Brother 61  . Lupus Sister     Social History   Social History  . Marital status: Married    Spouse name: N/A  . Number of children: N/A  . Years of education: N/A   Occupational History  . Not on file.   Social History Main Topics  . Smoking status: Never Smoker  . Smokeless tobacco: Never Used  . Alcohol use No     Comment: RARELY  . Drug use: No  . Sexual activity: No   Other Topics Concern  . Not on file   Social History Narrative  . No narrative on file     Current Outpatient Prescriptions:  .  albuterol (PROVENTIL HFA;VENTOLIN HFA) 108 (90 BASE) MCG/ACT inhaler, Inhale 2 puffs into the lungs every 4 (four) hours as needed for wheezing or shortness of breath., Disp:  1 Inhaler, Rfl: 5 .  amitriptyline (ELAVIL) 25 MG tablet, Take 1 tablet (25 mg total) by mouth at bedtime., Disp: 30 tablet, Rfl: 0 .  clonazePAM (KLONOPIN) 0.5 MG tablet, Take 1 tablet (0.5 mg total) by mouth 3 (three) times daily as needed for anxiety., Disp: 30 tablet, Rfl: 0 .  gabapentin (NEURONTIN) 300 MG capsule, Take 1 capsule (300 mg total) by mouth 3 (three) times daily., Disp: 270 capsule, Rfl: 1 .  levothyroxine (SYNTHROID, LEVOTHROID) 125 MCG tablet, Take 1 tablet by mouth  daily, Disp: 90 tablet, Rfl: 1 .  neomycin-polymyxin b-dexamethasone (MAXITROL) 3.5-10000-0.1 OINT, , Disp: , Rfl:  .  NON FORMULARY, Take 2 capsules by  mouth daily. MicroPlex Food Supplement, Disp: , Rfl:  .  NON FORMULARY, Take 2 capsules by mouth daily. Alpha CRS +, Disp: , Rfl:  .  omeprazole (PRILOSEC) 20 MG capsule, Take 1 capsule by mouth  daily, Disp: 90 capsule, Rfl: 0 .  OVER THE COUNTER MEDICATION, Take 1 capsule by mouth 2 (two) times daily. Deep blue polyphenol, Disp: , Rfl:  .  OVER THE COUNTER MEDICATION, Take 2 capsules by mouth every morning. XEO Mega, Disp: , Rfl:  .  OVER THE COUNTER MEDICATION, Take 1 capsule by mouth 2 (two) times daily. digestzen, Disp: , Rfl:  .  Spacer/Aero-Holding Chambers (E-Z SPACER) inhaler, Use as instructed, Disp: 1 each, Rfl: 1 .  valACYclovir (VALTREX) 1000 MG tablet, Take 1 tablet (1,000 mg total) by mouth 2 (two) times daily., Disp: 20 tablet, Rfl: 0 .  metaxalone (SKELAXIN) 800 MG tablet, Take 1 tablet (800 mg total) by mouth 3 (three) times daily., Disp: 90 tablet, Rfl: 0 .  predniSONE (STERAPRED UNI-PAK 48 TAB) 10 MG (48) TBPK tablet, Take by mouth daily. Take as directed, Disp: 48 tablet, Rfl: 0  Allergies  Allergen Reactions  . Aspir-81 [Aspirin] Other (See Comments)    Coagulation disorder  . Fluzone [Flu Virus Vaccine]   . Lipitor [Atorvastatin] Other (See Comments)    Joint pain  . Statins Other (See Comments)  . Topamax [Topiramate] Other (See Comments)    Cognitive issues   . Vascepa [Epa Ethyl Ester] Swelling and Other (See Comments)  . Voltaren [Diclofenac Sodium] Hives and Other (See Comments)     ROS  Constitutional: Negative for fever or weight change.  Respiratory: Negative for cough and shortness of breath.   Cardiovascular: Negative for chest pain or palpitations.  Gastrointestinal: Negative for abdominal pain, no bowel changes.  Musculoskeletal: Negative for gait problem or joint swelling.  Skin: Negative for rash.  Neurological: Negative for dizziness or headache.  No other specific complaints in a complete review of systems (except as listed in HPI  above).  Objective  Vitals:   01/23/16 1355  BP: (!) 136/92  Pulse: 92  Resp: 16  Temp: 97.7 F (36.5 C)  TempSrc: Oral  SpO2: 97%  Weight: 247 lb 6.4 oz (112.2 kg)  Height: 5' 6.25" (1.683 m)    Body mass index is 39.63 kg/m.  Physical Exam  Constitutional: Patient appears well-developed and well-nourished. Obese No distress.  HEENT: head atraumatic, normocephalic, pupils equal and reactive to light, neck supple, spasm on right nuchal area, throat within normal limits Cardiovascular: Normal rate, regular rhythm and normal heart sounds.  No murmur heard. No BLE edema. Pulmonary/Chest: Effort normal and breath sounds normal. No respiratory distress. Abdominal: Soft.  There is no tenderness. Psychiatric: Patient has a normal mood and affect. behavior is normal. Judgment  and thought content normal. Muscular skeletal: positive empty can sign on left side, but normal on right side, pain radiates from right side of neck to right hand, improves when she places fist above head.   PHQ2/9: Depression screen [facility] 2/9 01/23/2016 11/17/2015 09/01/2015 08/07/2015 04/07/2015  Decreased Interest 0 0 0 0 0  Down, Depressed, Hopeless 0 0 0 0 0  PHQ - 2 Score 0 0 0 0 0     Fall Risk: Fall Risk  01/23/2016 11/17/2015 09/01/2015 08/07/2015 04/07/2015  Falls in the past year? Yes No No No No  Number falls in past yr: 1 - - - -  Injury with Fall? No - - - -     Functional Status Survey: Is the patient deaf or have difficulty hearing?: No Does the patient have difficulty seeing, even when wearing glasses/contacts?: No Does the patient have difficulty concentrating, remembering, or making decisions?: No Does the patient have difficulty walking or climbing stairs?: No Does the patient have difficulty dressing or bathing?: No Does the patient have difficulty doing errands alone such as visiting a doctor's office or shopping?: No    Assessment & Plan  1. DDD (degenerative disc disease),  cervical   2. Degenerative joint disease of left acromioclavicular joint   3. Neck muscle spasm  - metaxalone (SKELAXIN) 800 MG tablet; Take 1 tablet (800 mg total) by mouth 3 (three) times daily.  Dispense: 90 tablet; Refill: 0 - predniSONE (STERAPRED UNI-PAK 48 TAB) 10 MG (48) TBPK tablet; Take by mouth daily. Take as directed  Dispense: 48 tablet; Refill: 0  4. Cervical radiculitis  - metaxalone (SKELAXIN) 800 MG tablet; Take 1 tablet (800 mg total) by mouth 3 (three) times daily.  Dispense: 90 tablet; Refill: 0 - predniSONE (STERAPRED UNI-PAK 48 TAB) 10 MG (48) TBPK tablet; Take by mouth daily. Take as directed  Dispense: 48 tablet; Refill: 0  5. Recurrent oral herpes simplex infection  - valACYclovir (VALTREX) 1000 MG tablet; Take 1 tablet (1,000 mg total) by mouth 2 (two) times daily as needed.  Dispense: 30 tablet; Refill: 0  6. Hordeolum externum of left upper eyelid  Continue follow up with ophthalmologist

## 2016-01-28 ENCOUNTER — Telehealth: Payer: Self-pay | Admitting: Family Medicine

## 2016-01-28 NOTE — Telephone Encounter (Signed)
Patient was put on 10mg  of prednisone and 800mg  of skelaxin. Patient having heart palpations and is asking for advise.

## 2016-01-31 NOTE — Telephone Encounter (Signed)
She was sent to Urgent Care

## 2016-05-04 ENCOUNTER — Telehealth: Payer: Self-pay | Admitting: Family Medicine

## 2016-05-04 NOTE — Telephone Encounter (Signed)
Pt has switched pharmacy and is requesting refill of levothyroxine to be sent to [facility]. Completely out

## 2016-05-06 ENCOUNTER — Encounter: Payer: Self-pay | Admitting: Family Medicine

## 2016-05-06 ENCOUNTER — Ambulatory Visit (INDEPENDENT_AMBULATORY_CARE_PROVIDER_SITE_OTHER): Payer: Self-pay | Admitting: Family Medicine

## 2016-05-06 VITALS — BP 142/82 | HR 87 | Temp 99.1°F | Resp 16 | Ht 66.0 in | Wt 251.5 lb

## 2016-05-06 DIAGNOSIS — M5412 Radiculopathy, cervical region: Secondary | ICD-10-CM

## 2016-05-06 DIAGNOSIS — E1143 Type 2 diabetes mellitus with diabetic autonomic (poly)neuropathy: Secondary | ICD-10-CM

## 2016-05-06 DIAGNOSIS — I10 Essential (primary) hypertension: Secondary | ICD-10-CM

## 2016-05-06 DIAGNOSIS — E038 Other specified hypothyroidism: Secondary | ICD-10-CM

## 2016-05-06 DIAGNOSIS — E782 Mixed hyperlipidemia: Secondary | ICD-10-CM

## 2016-05-06 MED ORDER — LEVOTHYROXINE SODIUM 125 MCG PO TABS: 125.0000 ug | ORAL_TABLET | Freq: Every day | ORAL | 1 refills | Status: DC

## 2016-05-06 NOTE — Progress Notes (Signed)
Name: Sarah Gordon   MRN: [id number]    DOB: Mar 31, 1950   Date:05/06/2016       Progress Note  Subjective  Chief Complaint  Chief Complaint  Patient presents with  . Medication Refill  . Diabetes    Does not check sugar at home-since not taking medication anymore  . Hypothyroidism    Stable  . Hyperlipidemia  . Hypertension    Quit taking medication due to weight loss advised by doctor  . Sleep Apnea    Could not sleep with CPAP machine and not use it. Sleeps on average 7 hours nightly with only waking up 1 time through out the night to go to the bathroom.    HPI  Hypothyroidism: she has been on same dose of Levothyroxine for years, last two TSH's have been at goal, no significant weight change, she takes one daily and half on Thursdays. She denies dysphagia or hair loss  DMII: she was diagnosed around 2000, she took Metformin but lost a lot of weight a couple of years ago and has been diet only, she still takes Gabapentin for neuropathy. She used to have numbness but is well controlled. Last hgbA1C was 5.7 %  Hyperlipidemia: she is not on statin therapy because of side effects, last LDL was high, discussed Zetia   HTN: took Losartan Hctz years ago, but bp has been well controlled, however today bp is elevated, she states she is worried about her nephew that is hospitalized and did not sleep well.   OSA: not on CPAP, sleeping well, and doing well since she lost weight years ago   Patient Active Problem List   Diagnosis Date Noted  . DDD (degenerative disc disease), cervical 01/23/2016  . Degenerative joint disease of left acromioclavicular joint 01/23/2016  . Recurrent oral herpes simplex infection 09/01/2015  . Irritable bowel syndrome with diarrhea 08/07/2015  . Statin intolerance 02/28/2015  . Airway hyperreactivity 09/10/2014  . Controlled type 2 diabetes with neuropathy ([place]) 09/10/2014  . Fibromyalgia 09/10/2014  . Obstructive sleep apnea 09/10/2014  . Fluttering  heart 12/29/2012  . Hyperlipidemia 12/29/2012  . Essential hypertension 12/29/2012  . Morbid obesity ([place]) 12/29/2012  . Arthritis due to gout 12/13/2007  . Acid reflux 10/24/2006  . Adult hypothyroidism 10/24/2006  . Localized osteoarthrosis 10/24/2006    Past Surgical History:  Procedure Laterality Date  . BREAST BIOPSY Left    benign  . COLONOSCOPY     Dr Alveta Heimlich  . COLONOSCOPY WITH PROPOFOL N/A 11/19/2015   Procedure: COLONOSCOPY WITH PROPOFOL;  Surgeon: Christene Lye, MD;  Location: [facility] ENDOSCOPY;  Service: Endoscopy;  Laterality: N/A;  . ESOPHAGOGASTRODUODENOSCOPY N/A 08/20/2014   Procedure: ESOPHAGOGASTRODUODENOSCOPY (EGD);  Surgeon: Christene Lye, MD;  Location: [facility] ENDOSCOPY;  Service: Endoscopy;  Laterality: N/A;  . FOOT SURGERY Left    heel spur  . HERNIA REPAIR     umbilical  . NASAL SINUS SURGERY  1980s  . UMBILICAL HERNIA REPAIR      Family History  Problem Relation Age of Onset  . Hypertension Mother   . Hyperlipidemia Mother   . Heart disease Brother 9    CABG x 3   . Heart attack Brother 25  . Lupus Sister     Social History   Social History  . Marital status: Married    Spouse name: N/A  . Number of children: N/A  . Years of education: N/A   Occupational History  . Not on file.   Social  History Main Topics  . Smoking status: Never Smoker  . Smokeless tobacco: Never Used  . Alcohol use No     Comment: RARELY  . Drug use: No  . Sexual activity: No   Other Topics Concern  . Not on file   Social History Narrative  . No narrative on file     Current Outpatient Prescriptions:  .  albuterol (PROVENTIL HFA;VENTOLIN HFA) 108 (90 BASE) MCG/ACT inhaler, Inhale 2 puffs into the lungs every 4 (four) hours as needed for wheezing or shortness of breath., Disp: 1 Inhaler, Rfl: 5 .  gabapentin (NEURONTIN) 300 MG capsule, Take 1 capsule (300 mg total) by mouth 3 (three) times daily. (Patient taking differently: Take 300 mg by mouth 2  (two) times daily. ), Disp: 270 capsule, Rfl: 1 .  levothyroxine (SYNTHROID, LEVOTHROID) 125 MCG tablet, Take 1 tablet (125 mcg total) by mouth daily., Disp: 90 tablet, Rfl: 1 .  NON FORMULARY, Take 2 capsules by mouth daily. MicroPlex Food Supplement, Disp: , Rfl:  .  NON FORMULARY, Take 2 capsules by mouth daily. Alpha CRS +, Disp: , Rfl:  .  omeprazole (PRILOSEC) 20 MG capsule, Take 1 capsule by mouth  daily, Disp: 90 capsule, Rfl: 0 .  OVER THE COUNTER MEDICATION, Take 1 capsule by mouth 2 (two) times daily. Deep blue polyphenol, Disp: , Rfl:  .  OVER THE COUNTER MEDICATION, Take 2 capsules by mouth every morning. XEO Mega, Disp: , Rfl:  .  OVER THE COUNTER MEDICATION, Take 1 capsule by mouth 2 (two) times daily. digestzen, Disp: , Rfl:  .  Spacer/Aero-Holding Chambers (E-Z SPACER) inhaler, Use as instructed, Disp: 1 each, Rfl: 1 .  valACYclovir (VALTREX) 1000 MG tablet, Take 1 tablet (1,000 mg total) by mouth 2 (two) times daily as needed., Disp: 30 tablet, Rfl: 0  Allergies  Allergen Reactions  . Aspir-81 [Aspirin] Other (See Comments)    Coagulation disorder  . Fluzone [Flu Virus Vaccine]   . Lipitor [Atorvastatin] Other (See Comments)    Joint pain  . Statins Other (See Comments)  . Topamax [Topiramate] Other (See Comments)    Cognitive issues   . Vascepa [Epa Ethyl Ester] Swelling and Other (See Comments)  . Voltaren [Diclofenac Sodium] Hives and Other (See Comments)     ROS  Constitutional: Negative for fever or significant  weight change.  Respiratory: Negative for cough and shortness of breath.   Cardiovascular: Negative for chest pain or palpitations.  Gastrointestinal: Negative for abdominal pain, no bowel changes.  Musculoskeletal: Negative for gait problem or joint swelling.  Skin: Negative for rash.  Neurological: Negative for dizziness or headache.  No other specific complaints in a complete review of systems (except as listed in HPI  above).   Objective  Vitals:   05/06/16 1259  BP: (!) 142/82  Pulse: 87  Resp: 16  Temp: 99.1 F (37.3 C)  TempSrc: Oral  SpO2: 96%  Weight: 251 lb 8 oz (114.1 kg)  Height: 5\' 6"  (1.676 m)    Body mass index is 40.59 kg/m.  Physical Exam  Constitutional: Patient appears well-developed and well-nourished. Obese  No distress.  HEENT: head atraumatic, normocephalic, pupils equal and reactive to light, neck supple, throat within normal limits Cardiovascular: Normal rate, regular rhythm and normal heart sounds.  No murmur heard. No BLE edema. Pulmonary/Chest: Effort normal and breath sounds normal. No respiratory distress. Abdominal: Soft.  There is no tenderness. Psychiatric: Patient has a normal mood and affect. behavior is normal.  Judgment and thought content normal.  PHQ2/9: Depression screen [facility] 2/9 05/06/2016 01/23/2016 11/17/2015 09/01/2015 08/07/2015  Decreased Interest 0 0 0 0 0  Down, Depressed, Hopeless 0 0 0 0 0  PHQ - 2 Score 0 0 0 0 0     Fall Risk: Fall Risk  05/06/2016 01/23/2016 11/17/2015 09/01/2015 08/07/2015  Falls in the past year? No Yes No No No  Number falls in past yr: - 1 - - -  Injury with Fall? - No - - -     Functional Status Survey: Is the patient deaf or have difficulty hearing?: No Does the patient have difficulty seeing, even when wearing glasses/contacts?: No Does the patient have difficulty concentrating, remembering, or making decisions?: No Does the patient have difficulty walking or climbing stairs?: No Does the patient have difficulty dressing or bathing?: No Does the patient have difficulty doing errands alone such as visiting a doctor's office or shopping?: No    Assessment & Plan  1. Diabetic autonomic neuropathy associated with type 2 diabetes mellitus ([place])  Last hgbA1C was at goal at 5.7%, she no longer has insurance and will have labs done for free this upcoming weekend and will send me copy of results  2. Mixed  hyperlipidemia  She can't tolerate statins or fish oil, currently dose not have insurance, once she gets it we will consider Zetia  3. Cervical radiculitis  She took some prednisone but it caused side effects, symptoms improved, and she is doing well now  4. Essential hypertension  She states that she did not sleep well last night, worried about her nephew, she has been off medication for a long time, advised to call back if bp remains elevated to start lisinopril   5. Other specified hypothyroidism  - levothyroxine (SYNTHROID, LEVOTHROID) 125 MCG tablet; Take 1 tablet (125 mcg total) by mouth daily.  Dispense: 90 tablet; Refill: 1

## 2016-05-06 NOTE — Patient Instructions (Signed)
Zetia - for cholesterol - please research

## 2016-06-08 ENCOUNTER — Encounter: Payer: Self-pay | Admitting: Family Medicine

## 2016-06-11 ENCOUNTER — Other Ambulatory Visit: Payer: Self-pay | Admitting: Family Medicine

## 2016-06-11 ENCOUNTER — Other Ambulatory Visit: Payer: Self-pay

## 2016-06-11 MED ORDER — AZITHROMYCIN 250 MG PO TABS: ORAL_TABLET | ORAL | 0 refills | Status: DC

## 2016-06-30 NOTE — Telephone Encounter (Signed)
Please close encounter. Thank you.

## 2016-12-02 ENCOUNTER — Other Ambulatory Visit: Payer: Self-pay | Admitting: Family Medicine

## 2016-12-02 DIAGNOSIS — E038 Other specified hypothyroidism: Secondary | ICD-10-CM

## 2016-12-02 NOTE — Telephone Encounter (Signed)
Patient requesting refill of Levothyroxine to [facility].

## 2016-12-03 NOTE — Telephone Encounter (Signed)
No labs in over one year, she must be seen, sent a 7 day supply to her pharmacy

## 2017-01-04 ENCOUNTER — Ambulatory Visit: Payer: Self-pay | Admitting: Family Medicine

## 2017-02-25 ENCOUNTER — Emergency Department
Admission: EM | Admit: 2017-02-25 | Discharge: 2017-02-25 | Disposition: A | Discharge status: Home / Self Care | Payer: Self-pay | Attending: Emergency Medicine | Admitting: Emergency Medicine

## 2017-02-25 ENCOUNTER — Emergency Department: Payer: Self-pay

## 2017-02-25 ENCOUNTER — Encounter: Payer: Self-pay | Admitting: Emergency Medicine

## 2017-02-25 ENCOUNTER — Other Ambulatory Visit: Payer: Self-pay

## 2017-02-25 DIAGNOSIS — Y939 Activity, unspecified: Secondary | ICD-10-CM | POA: Insufficient documentation

## 2017-02-25 DIAGNOSIS — S0990XA Unspecified injury of head, initial encounter: Secondary | ICD-10-CM

## 2017-02-25 DIAGNOSIS — J45909 Unspecified asthma, uncomplicated: Secondary | ICD-10-CM | POA: Insufficient documentation

## 2017-02-25 DIAGNOSIS — E1140 Type 2 diabetes mellitus with diabetic neuropathy, unspecified: Secondary | ICD-10-CM | POA: Insufficient documentation

## 2017-02-25 DIAGNOSIS — E039 Hypothyroidism, unspecified: Secondary | ICD-10-CM | POA: Insufficient documentation

## 2017-02-25 DIAGNOSIS — W0110XA Fall on same level from slipping, tripping and stumbling with subsequent striking against unspecified object, initial encounter: Secondary | ICD-10-CM | POA: Insufficient documentation

## 2017-02-25 DIAGNOSIS — W19XXXA Unspecified fall, initial encounter: Secondary | ICD-10-CM

## 2017-02-25 DIAGNOSIS — Z79899 Other long term (current) drug therapy: Secondary | ICD-10-CM | POA: Insufficient documentation

## 2017-02-25 DIAGNOSIS — S0181XA Laceration without foreign body of other part of head, initial encounter: Secondary | ICD-10-CM | POA: Insufficient documentation

## 2017-02-25 DIAGNOSIS — I10 Essential (primary) hypertension: Secondary | ICD-10-CM | POA: Insufficient documentation

## 2017-02-25 DIAGNOSIS — Y92009 Unspecified place in unspecified non-institutional (private) residence as the place of occurrence of the external cause: Secondary | ICD-10-CM | POA: Insufficient documentation

## 2017-02-25 DIAGNOSIS — Y999 Unspecified external cause status: Secondary | ICD-10-CM | POA: Insufficient documentation

## 2017-02-25 MED ORDER — ACETAMINOPHEN 500 MG PO TABS
1000.0000 mg | ORAL_TABLET | Freq: Once | ORAL | Status: AC
  Administered 2017-02-25: 1000 mg via ORAL
  Filled 2017-02-25: qty 2

## 2017-02-25 NOTE — Discharge Instructions (Signed)
Keep laceration dry and clean. Wash with warm water and soap. Apply topical bacitracin. Protect from the sun to minimize scarring. Cover it with SPF 69 or higher and use hat when out in the sun for 6-9 months. You received 4 stitches that will dissolve and do not need to be removed.  Watch for signs of infection: pus, redness of the skin surrounding it, or fever. If these develop see your doctor or return to the ER for antibiotics.   You were seen in the emergency department after a fall. Luckily all of your imaging studies did not show any evidence of injuries. Follow-up with you doctor within the next 2-3 days for further evaluation. Sometimes injuries can present at a later time and therefore it is imperative that you return to the emergency room if you have a severe headache, facial droop, neck pain, numbness or weakness of your extremities, slurred speech, difficulty finding words, chest pain, back pain, abdominal pain, or any other new symptoms that were not present during this visit. You may take Tylenol at home for your pain.

## 2017-02-25 NOTE — ED Triage Notes (Signed)
Pt ambulatory to triage without difficulty. Pt states that she was going up this step this morning and tripped and fell, hitting her head. Pt has small laceration to the left side of her forehead. Pt denies LOC, N/V, or anti coagulant use. Pt states that she does have a headache but that she had a headache before hitting her head and it is no worse since then. Pt is A & O x 4 in triage. Pt is hypertensive, states that she has hx/o HTN and is not on any medication currently for her blood pressure.

## 2017-02-25 NOTE — ED Provider Notes (Signed)
[facility] Emergency Department Provider Note  ____________________________________________  Time seen: Approximately 8:23 AM  I have reviewed the triage vital signs and the nursing notes.   HISTORY  Chief Complaint Fall   HPI Sarah Gordon is a 66 y.o. female who presents for evaluation of head trauma and laceration. Patient reports she tripped in her house and fell hitting her forehead into the concrete steps. Patient sustained a forehead laceration. Tetanus is less than 10 years. Patient is complaining of mild constant HA that preceded the trauma, diffuse and non radiating. No changes in vision, no N/V, no neck pain, back pain, CP, extremity pain. Patient denies dizziness or any preceding symptoms and reports that the fall was mechanical in nature.   Past Medical History:  Diagnosis Date  . Asthma   . Esophageal reflux   . Gouty arthropathy, unspecified   . Herpes simplex without mention of complication   . Irritable bowel syndrome   . Localized osteoarthrosis not specified whether primary or secondary, unspecified site   . Migraine, unspecified, without mention of intractable migraine without mention of status migrainosus   . Mixed hyperlipidemia   . Myalgia and myositis, unspecified   . Obstructive sleep apnea (adult) (pediatric)   . Palpitations   . Type II or unspecified type diabetes mellitus without mention of complication, not stated as uncontrolled   . Umbilical hernia without mention of obstruction or gangrene   . Unspecified essential hypertension   . Unspecified hypothyroidism     Patient Active Problem List   Diagnosis Date Noted  . DDD (degenerative disc disease), cervical 01/23/2016  . Degenerative joint disease of left acromioclavicular joint 01/23/2016  . Recurrent oral herpes simplex infection 09/01/2015  . Irritable bowel syndrome with diarrhea 08/07/2015  . Statin intolerance 02/28/2015  . Airway hyperreactivity 09/10/2014    . Controlled type 2 diabetes with neuropathy ([place]) 09/10/2014  . Fibromyalgia 09/10/2014  . Obstructive sleep apnea 09/10/2014  . Fluttering heart 12/29/2012  . Hyperlipidemia 12/29/2012  . Essential hypertension 12/29/2012  . Morbid obesity ([place]) 12/29/2012  . Arthritis due to gout 12/13/2007  . Acid reflux 10/24/2006  . Adult hypothyroidism 10/24/2006  . Localized osteoarthrosis 10/24/2006    Past Surgical History:  Procedure Laterality Date  . BREAST BIOPSY Left    benign  . COLONOSCOPY     Dr Alveta Heimlich  . COLONOSCOPY WITH PROPOFOL N/A 11/19/2015   Procedure: COLONOSCOPY WITH PROPOFOL;  Surgeon: Christene Lye, MD;  Location: [facility] ENDOSCOPY;  Service: Endoscopy;  Laterality: N/A;  . ESOPHAGOGASTRODUODENOSCOPY N/A 08/20/2014   Procedure: ESOPHAGOGASTRODUODENOSCOPY (EGD);  Surgeon: Christene Lye, MD;  Location: [facility] ENDOSCOPY;  Service: Endoscopy;  Laterality: N/A;  . FOOT SURGERY Left    heel spur  . HERNIA REPAIR     umbilical  . NASAL SINUS SURGERY  1980s  . UMBILICAL HERNIA REPAIR      Prior to Admission medications   Medication Sig Start Date End Date Taking? Authorizing Provider  albuterol (PROVENTIL HFA;VENTOLIN HFA) 108 (90 BASE) MCG/ACT inhaler Inhale 2 puffs into the lungs every 4 (four) hours as needed for wheezing or shortness of breath. 02/12/15   Ashok Norris, MD  azithromycin (ZITHROMAX) 250 MG tablet Take as directed 06/11/16   Steele Sizer, MD  gabapentin (NEURONTIN) 300 MG capsule Take 1 capsule (300 mg total) by mouth 3 (three) times daily. Patient taking differently: Take 300 mg by mouth 2 (two) times daily.  11/18/15   Steele Sizer, MD  levothyroxine (  SYNTHROID, LEVOTHROID) 125 MCG tablet TAKE 1 TABLET BY MOUTH ONCE DAILY 12/03/16   Steele Sizer, MD  NON FORMULARY Take 2 capsules by mouth daily. MicroPlex Food Supplement    [provider]  NON FORMULARY Take 2 capsules by mouth daily. Alpha CRS +    [provider]   omeprazole (PRILOSEC) 20 MG capsule Take 1 capsule by mouth  daily 12/24/15   Roselee Nova, MD  OVER THE COUNTER MEDICATION Take 1 capsule by mouth 2 (two) times daily. Deep blue polyphenol    [provider]  OVER THE COUNTER MEDICATION Take 2 capsules by mouth every morning. XEO Mega    [provider]  OVER THE COUNTER MEDICATION Take 1 capsule by mouth 2 (two) times daily. digestzen    [provider]  Spacer/Aero-Holding Chambers (E-Z SPACER) inhaler Use as instructed 05/14/15   Ashok Norris, MD  valACYclovir (VALTREX) 1000 MG tablet Take 1 tablet (1,000 mg total) by mouth 2 (two) times daily as needed. 01/23/16   Steele Sizer, MD    Allergies Aspir-81 [aspirin]; Fluzone [flu virus vaccine]; Lipitor [atorvastatin]; Statins; Topamax [topiramate]; Vascepa [epa ethyl ester]; and Voltaren [diclofenac sodium]  Family History  Problem Relation Age of Onset  . Hypertension Mother   . Hyperlipidemia Mother   . Heart disease Brother 61       CABG x 3   . Heart attack Brother 51  . Lupus Sister     Social History Social History   Tobacco Use  . Smoking status: Never Smoker  . Smokeless tobacco: Never Used  Substance Use Topics  . Alcohol use: No    Alcohol/week: 0.0 oz    Comment: RARELY  . Drug use: No    Review of Systems Constitutional: Negative for fever. Eyes: Negative for visual changes. ENT: Negative for facial injury or neck injury Cardiovascular: Negative for chest injury. Respiratory: Negative for shortness of breath. Negative for chest wall injury. Gastrointestinal: Negative for abdominal pain or injury. Genitourinary: Negative for dysuria. Musculoskeletal: Negative for back injury, negative for arm or leg pain. Skin: + forehead laceration Neurological: + head injury.  ___________________________________________   PHYSICAL EXAM:  VITAL SIGNS: ED Triage Vitals [02/25/17 0756]  Enc Vitals Group     BP (!) 206/109      Pulse Rate 77     Resp 16     Temp 98.3 F (36.8 C)     Temp Source Oral     SpO2 98 %     Weight 240 lb (108.9 kg)     Height 5\' 6"  (1.676 m)     Head Circumference      Peak Flow      Pain Score 6     Pain Loc      Pain Edu?      Excl. in [place]?     Constitutional: Alert and oriented. No acute distress. Does not appear intoxicated. HEENT Head: Normocephalic, with linear superficial laceration of the forehead Face: No facial bony tenderness. Stable midface Ears: No hemotympanum bilaterally. No Battle sign Eyes: No eye injury. PERRL. No raccoon eyes Nose: Nontender. No epistaxis. No rhinorrhea Mouth/Throat: Mucous membranes are moist. No oropharyngeal blood. No dental injury. Airway patent without stridor. Normal voice. Neck: no C-collar in place. No midline c-spine tenderness.  Cardiovascular: Normal rate, regular rhythm. Normal and symmetric distal pulses are present in all extremities. Pulmonary/Chest: Chest wall is stable and nontender to palpation/compression. Normal respiratory effort. Breath sounds are normal.  No crepitus.  Abdominal: Soft, nontender, non distended. Musculoskeletal: Nontender with normal full range of motion in all extremities. No deformities. No thoracic or lumbar midline spinal tenderness. Pelvis is stable. Skin: Skin is warm, dry and intact. No abrasions or contutions. Psychiatric: Speech and behavior are appropriate. Neurological: Normal speech and language. Moves all extremities to command. No gross focal neurologic deficits are appreciated.  Glascow Coma Score: 4 - Opens eyes on own 6 - Follows simple motor commands 5 - Alert and oriented GCS: 15   ____________________________________________   LABS (all labs ordered are listed, but only abnormal results are displayed)  Labs Reviewed - No data to display ____________________________________________  EKG  none  ____________________________________________  RADIOLOGY  Head CT: 1. Left  scalp soft tissue injury without underlying fracture. 2. Normal noncontrast CT appearance of the brain aside from chronic partially empty sella, often a normal anatomic variant but can be associated with idiopathic intracranial hypertension (pseudotumor cerebri). ____________________________________________   PROCEDURES  Procedure(s) performed:yes .Marland KitchenLaceration Repair Date/Time: 02/25/2017 9:26 AM Performed by: Rudene Re, MD Authorized by: Rudene Re, MD   Consent:    Consent obtained:  Verbal   Consent given by:  Patient   Risks discussed:  Infection, need for additional repair, pain, poor cosmetic result and poor wound healing Anesthesia (see MAR for exact dosages):    Anesthesia method:  Local infiltration   Local anesthetic:  Lidocaine 1% WITH epi Laceration details:    Location:  Face   Face location:  Forehead   Length (cm):  2 Repair type:    Repair type:  Simple Pre-procedure details:    Preparation:  Patient was prepped and draped in usual sterile fashion and imaging obtained to evaluate for foreign bodies Exploration:    Hemostasis achieved with:  Direct pressure   Wound exploration: entire depth of wound probed and visualized     Wound extent: no fascia violation noted, no foreign bodies/material noted and no underlying fracture noted   Treatment:    Area cleansed with:  Betadine   Irrigation solution:  Sterile saline Skin repair:    Repair method:  Sutures   Suture size:  6-0   Suture material:  Fast-absorbing gut   Suture technique:  Simple interrupted   Number of sutures:  4 Approximation:    Approximation:  Close   [place] border: well-aligned   Post-procedure details:    Dressing:  Open (no dressing)   Patient tolerance of procedure:  Tolerated well, no immediate complications   Critical Care performed:  None ____________________________________________   INITIAL IMPRESSION / ASSESSMENT AND PLAN / ED COURSE  66 y.o. female who  presents for evaluation of head trauma and forehead laceration. Patient is neurologically intact, no signs or symptoms of basilar skull fracture. She has a linear laceration to her left forehead. No obvious injuries on exam or history. Head CT is pending. Tetanus up-to-date. Laceration repaired per procedure note above.     _________________________ 9:27 AM on 02/25/2017 -----------------------------------------  CT negative for injuries. Patient will be dc home on standard precautions for laceration and fall. Findings of empty sella discussed with patient and recommended outpatient follow up for that with PCP.   As part of my medical decision making, I reviewed the following data within the [place] notes reviewed and incorporated, Radiograph reviewed , Notes from prior ED visits and [place] Controlled Substance Database    Pertinent labs & imaging results that were available during my care of the patient  were reviewed by me and considered in my medical decision making (see chart for details).    ____________________________________________   FINAL CLINICAL IMPRESSION(S) / ED DIAGNOSES  Final diagnoses:  Fall, initial encounter  Injury of head, initial encounter  Laceration of forehead, initial encounter      NEW MEDICATIONS STARTED DURING THIS VISIT:  ED Discharge Orders    None       Note:  This document was prepared using Dragon voice recognition software and may include unintentional dictation errors.    Rudene Re, MD 02/25/17 [id number]

## 2017-06-20 ENCOUNTER — Other Ambulatory Visit: Payer: Self-pay | Admitting: Allergy

## 2017-07-05 ENCOUNTER — Other Ambulatory Visit: Payer: Self-pay | Admitting: Family Medicine

## 2017-07-05 DIAGNOSIS — E038 Other specified hypothyroidism: Secondary | ICD-10-CM

## 2017-07-12 ENCOUNTER — Other Ambulatory Visit: Payer: Self-pay

## 2017-07-12 DIAGNOSIS — E038 Other specified hypothyroidism: Secondary | ICD-10-CM

## 2017-07-12 NOTE — Telephone Encounter (Signed)
Refill request for thyroid medication. Levothyroxine 125 mcg  Last visit: 05/06/2016   Lab Results  Component Value Date   TSH 1.13 10/23/2015    No follow up

## 2017-07-15 ENCOUNTER — Other Ambulatory Visit: Payer: Self-pay | Admitting: Family Medicine

## 2017-07-15 DIAGNOSIS — E038 Other specified hypothyroidism: Secondary | ICD-10-CM

## 2017-07-15 NOTE — Telephone Encounter (Signed)
Copied from [place]. Topic: Quick Communication - Rx Refill/Question >> Jul 15, 2017  4:54 PM Selinda Flavin B, NT wrote: Medication: levothyroxine (SYNTHROID, LEVOTHROID) 125 MCG tablet  Has the patient contacted their pharmacy? Yes.  (Agent: If no, request that the patient contact the pharmacy for the refill.) Preferred Pharmacy (with phone number or street name): [facility], [place]: Please be advised that RX refills may take up to 3 business days. We ask that you follow-up with your pharmacy.  Patient is currently out. Has med refill appointment schedule for 07/26/17. Is requesting an emergency refill.

## 2017-07-18 MED ORDER — LEVOTHYROXINE SODIUM 125 MCG PO TABS: 125.0000 ug | ORAL_TABLET | Freq: Every day | ORAL | 0 refills | Status: DC

## 2017-07-18 NOTE — Telephone Encounter (Signed)
Synthroid refill request     See attached.   Thanks

## 2017-07-19 ENCOUNTER — Ambulatory Visit: Payer: Self-pay | Admitting: Nurse Practitioner

## 2017-07-26 ENCOUNTER — Encounter: Payer: Self-pay | Admitting: Nurse Practitioner

## 2017-07-26 ENCOUNTER — Ambulatory Visit (INDEPENDENT_AMBULATORY_CARE_PROVIDER_SITE_OTHER): Payer: Self-pay | Admitting: Nurse Practitioner

## 2017-07-26 VITALS — BP 138/82 | HR 91 | Temp 98.5°F | Resp 16 | Ht 66.0 in | Wt 279.4 lb

## 2017-07-26 DIAGNOSIS — E1140 Type 2 diabetes mellitus with diabetic neuropathy, unspecified: Secondary | ICD-10-CM

## 2017-07-26 DIAGNOSIS — E039 Hypothyroidism, unspecified: Secondary | ICD-10-CM

## 2017-07-26 DIAGNOSIS — I10 Essential (primary) hypertension: Secondary | ICD-10-CM

## 2017-07-26 DIAGNOSIS — Z79899 Other long term (current) drug therapy: Secondary | ICD-10-CM

## 2017-07-26 LAB — COMPLETE METABOLIC PANEL WITH GFR
AG Ratio: 1.5 (calc) (ref 1.0–2.5)
ALT: 20 U/L (ref 6–29)
AST: 23 U/L (ref 10–35)
Albumin: 3.8 g/dL (ref 3.6–5.1)
Alkaline phosphatase (APISO): 78 U/L (ref 33–130)
BUN: 18 mg/dL (ref 7–25)
CO2: 28 mmol/L (ref 20–32)
Calcium: 9 mg/dL (ref 8.6–10.4)
Chloride: 106 mmol/L (ref 98–110)
Creat: 0.94 mg/dL (ref 0.50–0.99)
GFR, Est African American: 74 mL/min/{1.73_m2} (ref >=60)
GFR, Est Non African American: 64 mL/min/{1.73_m2} (ref >=60)
Globulin: 2.6 g/dL (calc) (ref 1.9–3.7)
Glucose, Bld: 128 mg/dL (ref 65–139)
Potassium: 4.1 mmol/L (ref 3.5–5.3)
Sodium: 141 mmol/L (ref 135–146)
Total Bilirubin: 0.5 mg/dL (ref 0.2–1.2)
Total Protein: 6.4 g/dL (ref 6.1–8.1)

## 2017-07-26 LAB — TSH: TSH: 11.59 mIU/L — ABNORMAL HIGH (ref 0.40–4.50)

## 2017-07-26 MED ORDER — LISINOPRIL 10 MG PO TABS: 5.0000 mg | ORAL_TABLET | Freq: Every day | ORAL | 0 refills | Status: DC

## 2017-07-26 NOTE — Progress Notes (Addendum)
Name: Sarah Gordon   MRN: [id number]    DOB: [birth year]   Date:07/26/2017       Progress Note  Subjective  Chief Complaint  Chief Complaint  Patient presents with  . Hypertension    Losartan was stopped a year ago, BP has went back up and would like to restart medication  . Hypothyroidism    HPI  Hypertension Patient got laceration in 02/25/2017 and had to go to ER- noted to be hypertensive at the ER and had left over losartan that she was on in the past and ran out today.  BP Readings from Last 3 Encounters:  07/26/17 138/82  02/25/17 (!) 189/98  05/06/16 (!) 142/82    Hypothyroidism Patient is taking 125 mcg 6 days a week and one day it was half one day a week. Pt sts has intermittent palpitations maybe once a month- usually when she drinks too much caffeine. Has IBS has fluctuant bowel changes with stress and certain foods. Denies cold/heat intolerances.   Lab Results  Component Value Date   TSH 1.13 10/23/2015    Morbid Obesity Patient states was doing paleo-gluten free diet and had lost significant weight but has gotten off of it. Noticed since she's been back on has gained weight and has increased brain fog.  Wt Readings from Last 3 Encounters:  07/26/17 279 lb 6.4 oz (126.7 kg)  02/25/17 240 lb (108.9 kg)  05/06/16 251 lb 8 oz (114.1 kg)    Diabetes Patient lost significant weight and was out of diabetes range. She has not checked her blood sugars in the past year but has monitor and her husband has diabetes and has up to date equipment. States her granddaughter is living with her and she has not been following her diet plan the last many months but plans to return to it. Denies polyphagia, polydipsia, polyuria, blurry vision, headaches.  Lab Results  Component Value Date   HGBA1C 5.7 (H) 10/23/2015    Patient Active Problem List   Diagnosis Date Noted  . DDD (degenerative disc disease), cervical 01/23/2016  . Degenerative joint disease of left acromioclavicular  joint 01/23/2016  . Recurrent oral herpes simplex infection 09/01/2015  . Irritable bowel syndrome with diarrhea 08/07/2015  . Statin intolerance 02/28/2015  . Airway hyperreactivity 09/10/2014  . Controlled type 2 diabetes with neuropathy ([place]) 09/10/2014  . Fibromyalgia 09/10/2014  . Obstructive sleep apnea 09/10/2014  . Fluttering heart 12/29/2012  . Hyperlipidemia 12/29/2012  . Essential hypertension 12/29/2012  . Morbid obesity ([place]) 12/29/2012  . Arthritis due to gout 12/13/2007  . Acid reflux 10/24/2006  . Adult hypothyroidism 10/24/2006  . Localized osteoarthrosis 10/24/2006    Past Medical History:  Diagnosis Date  . Asthma   . Esophageal reflux   . Gouty arthropathy, unspecified   . Herpes simplex without mention of complication   . Irritable bowel syndrome   . Localized osteoarthrosis not specified whether primary or secondary, unspecified site   . Migraine, unspecified, without mention of intractable migraine without mention of status migrainosus   . Mixed hyperlipidemia   . Myalgia and myositis, unspecified   . Obstructive sleep apnea (adult) (pediatric)   . Palpitations   . Type II or unspecified type diabetes mellitus without mention of complication, not stated as uncontrolled   . Umbilical hernia without mention of obstruction or gangrene   . Unspecified essential hypertension   . Unspecified hypothyroidism     Past Surgical History:  Procedure Laterality Date  . BREAST  BIOPSY Left    benign  . COLONOSCOPY     Dr Alveta Heimlich  . COLONOSCOPY WITH PROPOFOL N/A 11/19/2015   Procedure: COLONOSCOPY WITH PROPOFOL;  Surgeon: Christene Lye, MD;  Location: [facility] ENDOSCOPY;  Service: Endoscopy;  Laterality: N/A;  . ESOPHAGOGASTRODUODENOSCOPY N/A 08/20/2014   Procedure: ESOPHAGOGASTRODUODENOSCOPY (EGD);  Surgeon: Christene Lye, MD;  Location: [facility] ENDOSCOPY;  Service: Endoscopy;  Laterality: N/A;  . FOOT SURGERY Left    heel spur  . HERNIA REPAIR      umbilical  . NASAL SINUS SURGERY  1980s  . UMBILICAL HERNIA REPAIR      Social History   Tobacco Use  . Smoking status: Never Smoker  . Smokeless tobacco: Never Used  Substance Use Topics  . Alcohol use: No    Alcohol/week: 0.0 oz    Comment: RARELY     Current Outpatient Medications:  .  levothyroxine (SYNTHROID, LEVOTHROID) 125 MCG tablet, Take 1 tablet (125 mcg total) by mouth daily for 10 days., Disp: 10 tablet, Rfl: 0 .  losartan (COZAAR) 25 MG tablet, Take 25 mg by mouth daily., Disp: , Rfl:  .  omeprazole (PRILOSEC) 20 MG capsule, Take 1 capsule by mouth  daily, Disp: 90 capsule, Rfl: 0 .  albuterol (PROVENTIL HFA;VENTOLIN HFA) 108 (90 BASE) MCG/ACT inhaler, Inhale 2 puffs into the lungs every 4 (four) hours as needed for wheezing or shortness of breath. (Patient not taking: Reported on 07/26/2017), Disp: 1 Inhaler, Rfl: 5 .  azithromycin (ZITHROMAX) 250 MG tablet, Take as directed (Patient not taking: Reported on 07/26/2017), Disp: 6 tablet, Rfl: 0 .  gabapentin (NEURONTIN) 300 MG capsule, Take 1 capsule (300 mg total) by mouth 3 (three) times daily. (Patient not taking: Reported on 07/26/2017), Disp: 270 capsule, Rfl: 1 .  NON FORMULARY, Take 2 capsules by mouth daily. MicroPlex Food Supplement, Disp: , Rfl:  .  NON FORMULARY, Take 2 capsules by mouth daily. Alpha CRS +, Disp: , Rfl:  .  OVER THE COUNTER MEDICATION, Take 1 capsule by mouth 2 (two) times daily. Deep blue polyphenol, Disp: , Rfl:  .  OVER THE COUNTER MEDICATION, Take 2 capsules by mouth every morning. XEO Mega, Disp: , Rfl:  .  OVER THE COUNTER MEDICATION, Take 1 capsule by mouth 2 (two) times daily. digestzen, Disp: , Rfl:  .  Spacer/Aero-Holding Chambers (E-Z SPACER) inhaler, Use as instructed (Patient not taking: Reported on 07/26/2017), Disp: 1 each, Rfl: 1 .  valACYclovir (VALTREX) 1000 MG tablet, Take 1 tablet (1,000 mg total) by mouth 2 (two) times daily as needed. (Patient not taking: Reported on 07/26/2017), Disp:  30 tablet, Rfl: 0  Allergies  Allergen Reactions  . Aspir-81 [Aspirin] Other (See Comments)    Coagulation disorder  . Fluzone [Flu Virus Vaccine]   . Lipitor [Atorvastatin] Other (See Comments)    Joint pain  . Statins Other (See Comments)  . Topamax [Topiramate] Other (See Comments)    Cognitive issues   . Vascepa [Epa Ethyl Ester] Swelling and Other (See Comments)  . Voltaren [Diclofenac Sodium] Hives and Other (See Comments)    ROS  Constitutional: Negative for fever or weight change.  Respiratory: Negative for cough and shortness of breath.   Cardiovascular: Negative for chest pain or palpitations.  Gastrointestinal: Negative for abdominal pain, no bowel changes.  Musculoskeletal: Negative for gait problem or joint swelling.  Skin: Negative for rash.  Neurological: Negative for dizziness or headache.  No other specific complaints in a complete review of  systems (except as listed in HPI above).  Objective  Vitals:   07/26/17 0821  BP: 138/82  Pulse: 91  Resp: 16  Temp: 98.5 F (36.9 C)  TempSrc: Oral  SpO2: 95%  Weight: 279 lb 6.4 oz (126.7 kg)  Height: 5\' 6"  (1.676 m)     Body mass index is 45.1 kg/m.  Nursing Note and Vital Signs reviewed.  Physical Exam  Constitutional: Patient appears well-developed and well-nourished. Obese  No distress. No thryomegaly  Cardiovascular: Normal rate, regular rhythm, S1/S2 present.  No murmur or rub heard. Pulses intact  Pulmonary/Chest: Effort normal and breath sounds clear. No respiratory distress or retractions. Abdominal: Soft and non-tender, bowel sounds present  Psychiatric: Patient has a normal mood and affect. behavior is normal. Judgment and thought content normal.  Diabetic Foot Exam - Simple   Simple Foot Form Diabetic Foot exam was performed with the following findings:  Yes 07/26/2017  9:09 AM  Visual Inspection Sensation Testing Intact to touch and monofilament testing bilaterally:  Yes Pulse  Check Posterior Tibialis and Dorsalis pulse intact bilaterally:  Yes Comments Pt has no ulcerations of skin break down, has bilateral callous by big toe and minimal fat pads      Assessment & Plan  1. Essential hypertension -Pt will monitor BP with home cuff for the next 2 weeks and call in readings  - COMPLETE METABOLIC PANEL WITH GFR - lisinopril (PRINIVIL,ZESTRIL) 10 MG tablet; Take 0.5 tablets (5 mg total) by mouth daily.  Dispense: 90 tablet; Refill: 0  2. Morbid obesity ([place]) Discussed- resuming diet and exercise  - COMPLETE METABOLIC PANEL WITH GFR  3. Controlled type 2 diabetes with neuropathy ([place]) -Patient will provide 2-3 fasting readings within the next 2 weeks. Will resume diet and weight loss  - COMPLETE METABOLIC PANEL WITH GFR  4. Adult hypothyroidism - will order levothyroxine pending TSH results  - TSH  5. Medication management  - COMPLETE METABOLIC PANEL WITH GFR  Patient is uninsured with limited, fixed income. Discussed the importance of getting lab work to ensure patient is being appropriately treated and need for A1C and lipids in the near future. Patient states she will be insured with medicare after her birthday. Informed of [facility] and provided further information to increase access to affordable healthcare for prevention. Pt sts will follow-up. Will check CMP and TSH today, ordered lisinopril; pending TSH for levothyroxine. Pt will provide at home BP readings and fasting blood sugars in 2 weeks.   -Red flags and when to present for emergency care or RTC including fever >101.65F, chest pain, shortness of breath, new/worsening/un-resolving symptoms,  reviewed with patient at time of visit. Follow up and care instructions discussed and provided in AVS. -Reviewed Health Maintenance: discussed need for mammogram, A1C and eye exam    I have reviewed this encounter including the documentation in this note and/or discussed this patient with the provider,  Suezanne Cheshire DNP AGNP-C. I am certifying that I agree with the content of this note as supervising physician. Steele Sizer, MD [facility] 07/26/2017, 5:34 PM

## 2017-07-26 NOTE — Patient Instructions (Addendum)
- It was great to meet you today!  - If you can get back to your gluten free and paleo diet that would be awesome! - Checking TSH and your comprehensive metabolic panel -If you have not heard anything from my staff in a week about any orders/referrals/studies from today, please contact us here to follow-up [phone] - I have already sent BP medicine to [facility], if you will keep a log of your blood pressures for the next 2 weeks and then call in to let us know how they are doing - Once I get your TSH results, I will refill your levothyroxine at the appropriate dose is.     [facility] Address & Phone Number [address] [place], [place] [address] Phone: [phone] Fax: [phone]  Medical Tuesday: 4:15pm - 8:00pm Wednesday: 9:00am - 12:00pm Thursday: 4:15pm - 8:00pm Eye Wednesday: 9:00am - 12:00pm Thursday: 1:00pm - 5:00pm Orthopedic Evaluations* Wednesday: 9:00am - 12:00pm *Evaluations only. We do not treat pain with medications. Referrals are made to [facility] orthopedics if treatment is needed.  Low-Gluten Eating Plan Gluten is a protein that is found in wheat, barley, rye, and some other grains. Some people have a condition that makes them unable to digest gluten. For those people, eating just a small amount of gluten can damage their intestines. This is not a gluten-free eating plan. This low-gluten eating plan is for people who feel better when they eat less gluten. What do I need to know about this eating plan?  You can eat anything that does not contain wheat or other grains that have gluten.  You can eat anything that is labeled "gluten-free."  Make sure to read food labels.  Eat a variety of foods so you get all of the nutrients that you need.  Avoid processed foods and sauces because many of them contain wheat. To have more control over the ingredients in your meals, consider making food yourself instead of buying prepared foods. What  foods can I eat? Grains Rice. Bulgur. Quinoa. Corn. Buckwheat. Amaranth. Corn tortillas or taco shells. Oatmeal that is labeled as "gluten free" or "uncontaminated." Vegetables Lettuce. Spinach. Peas. Beets. Cauliflower. Cabbage. Broccoli. Carrots. Tomatoes. Squash. Eggplant. Herbs. Peppers. Onions. Cucumbers. Brussels sprouts. Yams and sweet potatoes. Beans. Lentils. Fruits Bananas. Apples. Oranges. Grapes. Papaya. Mango. Pomegranate. Kiwi. Grapefruit. Cherries. Meats and Other Protein Sources Beef. Pork. Chicken. [place]. Fish. Eggs. Tofu. Beans. Nuts. Lentils. Dairy Milk. Ice cream. Yogurt. Cheese. Cottage cheese. Beverages Water. Coffee. Tea. Juice. Soda. Seltzer water. Condiments Mustard. Relish. Low-fat, low-sugar ketchup. Low-fat, low-sugar barbecue sauce. Vinegar. Low-fat or fat-free mayonnaise. Sweets and Desserts Honey. Sugar. Maple syrup. Fats and Oils Butter. Vegetable oil. Olive oil. Canola oil. [place] oil. Other Arrowroot or cornstarch. Potato flour. The items listed above may not be a complete list of recommended foods or beverages. Contact your dietitian for more options. What foods are not recommended? Grains Wheat. Barley. Rye. Oatmeal. Meat and Other Protein Sources Seitan. Cold cuts. Hotdogs. Salami. Sausages. Beverages Beer. Condiments Malt vinegar. Salad dressing. Soy sauce. Sweets and Desserts Licorice. Brown rice syrup. Pre-made pudding or pudding mixes. Other Bouillon cubes. Canned or boxed pre-made soups or soup packets. Bagged chips, such as potato chips and tortilla chips. Seasoning packets. The items listed above may not be a complete list of foods and beverages to avoid. Contact your dietitian for more information. This information is not intended to replace advice given to you by your health care provider. Make  sure you discuss any questions you have with your health care provider. Document Released: 07/23/2014 Document Revised: 08/14/2015 Document  Reviewed: 04/03/2014 Elsevier Interactive Patient Education  [employer].

## 2017-07-28 ENCOUNTER — Other Ambulatory Visit: Payer: Self-pay | Admitting: Nurse Practitioner

## 2017-07-28 DIAGNOSIS — I10 Essential (primary) hypertension: Secondary | ICD-10-CM

## 2017-07-28 DIAGNOSIS — E038 Other specified hypothyroidism: Secondary | ICD-10-CM

## 2017-07-28 DIAGNOSIS — K219 Gastro-esophageal reflux disease without esophagitis: Secondary | ICD-10-CM

## 2017-07-28 MED ORDER — OMEPRAZOLE 20 MG PO CPDR
DELAYED_RELEASE_CAPSULE | ORAL | 0 refills | Status: DC
Start: 2017-07-28 — End: 2017-10-18

## 2017-07-28 MED ORDER — LISINOPRIL 10 MG PO TABS: 5.0000 mg | ORAL_TABLET | Freq: Every day | ORAL | 0 refills | Status: DC

## 2017-07-28 MED ORDER — LEVOTHYROXINE SODIUM 137 MCG PO TABS
125.0000 ug | ORAL_TABLET | Freq: Every day | ORAL | 0 refills | Status: DC
Start: 2017-07-28 — End: 2017-09-19

## 2017-08-02 IMAGING — CR DG CERVICAL SPINE COMPLETE 4+V
1 series · 6 of 6 positions shown · non-contrast
Comparison: None.

CLINICAL DATA: Left lateral neck pain radiating to the left
shoulder for 1 month, no history of injury

EXAM:
CERVICAL SPINE - COMPLETE 4+ VIEW

[Series 1: dg cervical spine complete · 0.14mm/px · 6 of 6 slices shown]
[im 1/6]
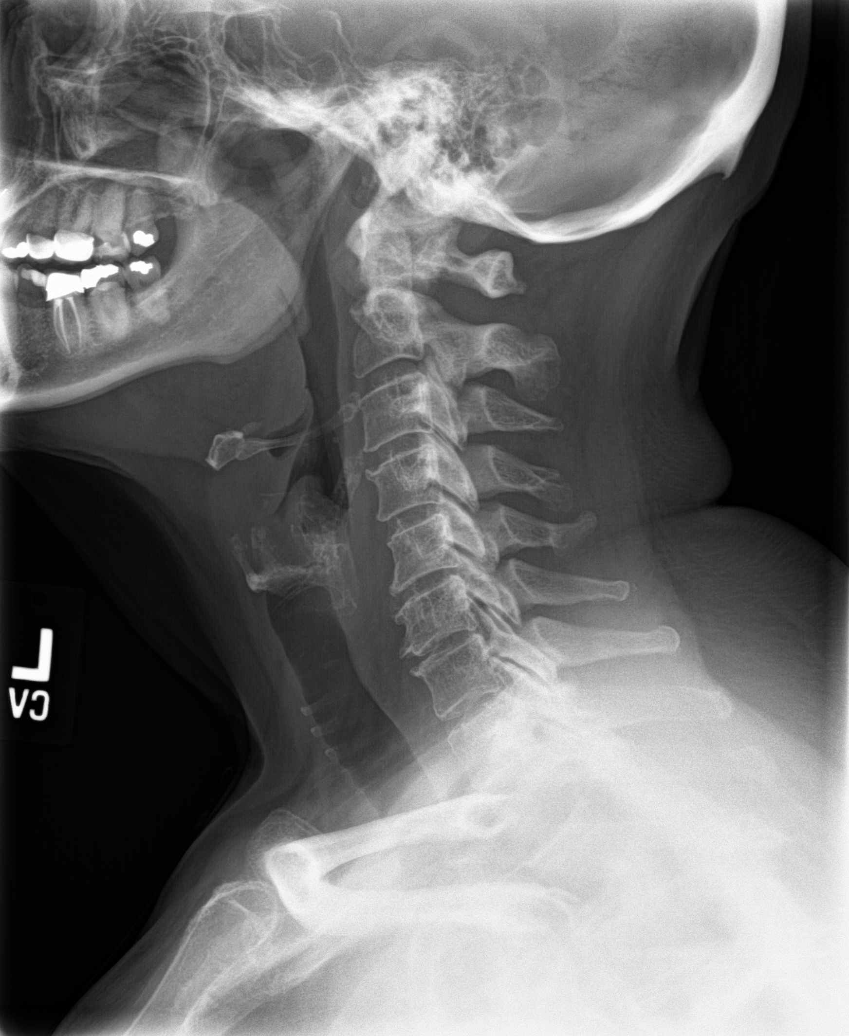
[im 2/6]
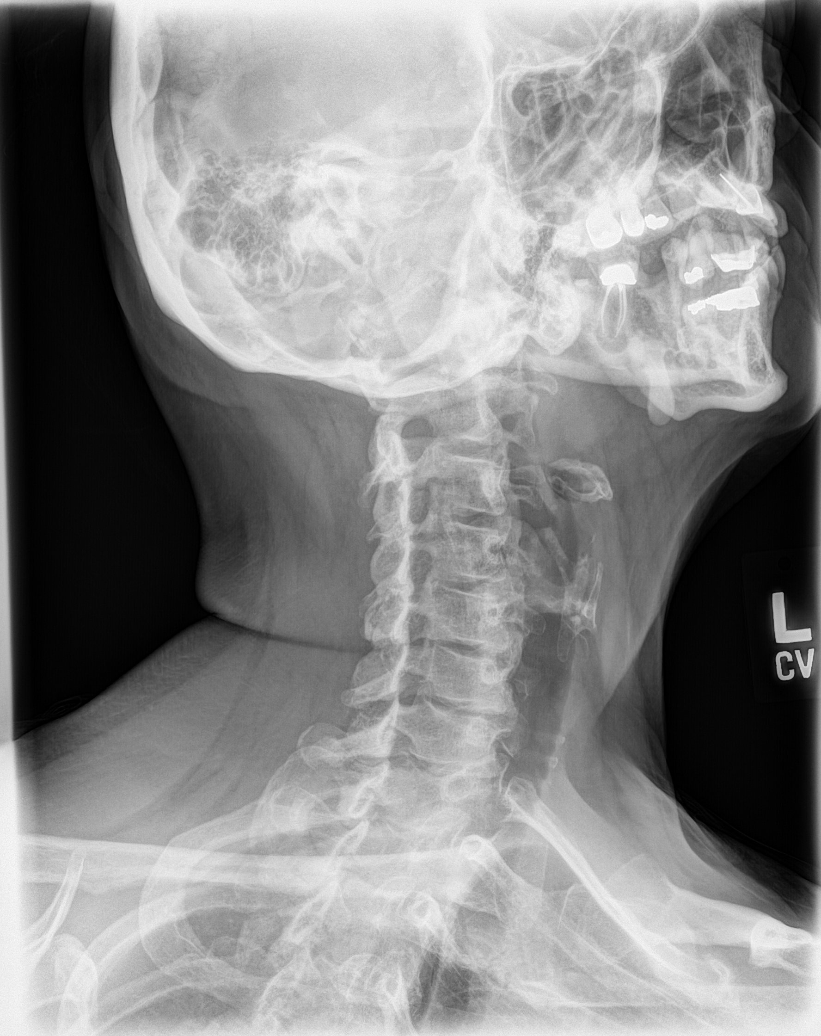
[im 3/6]
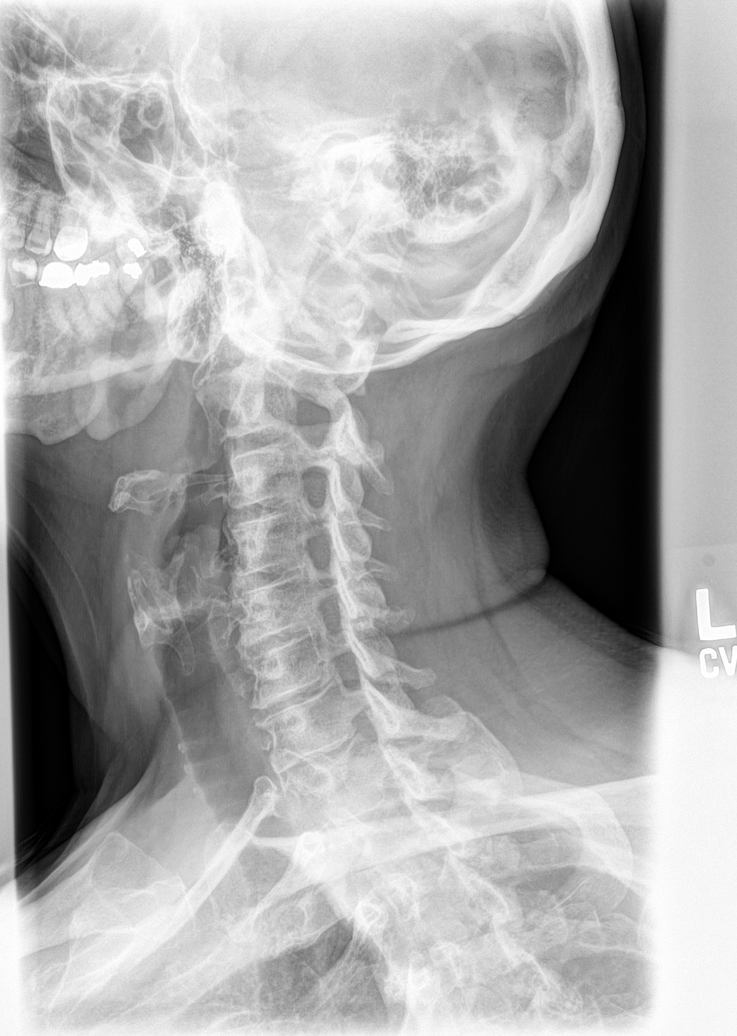
[im 4/6]
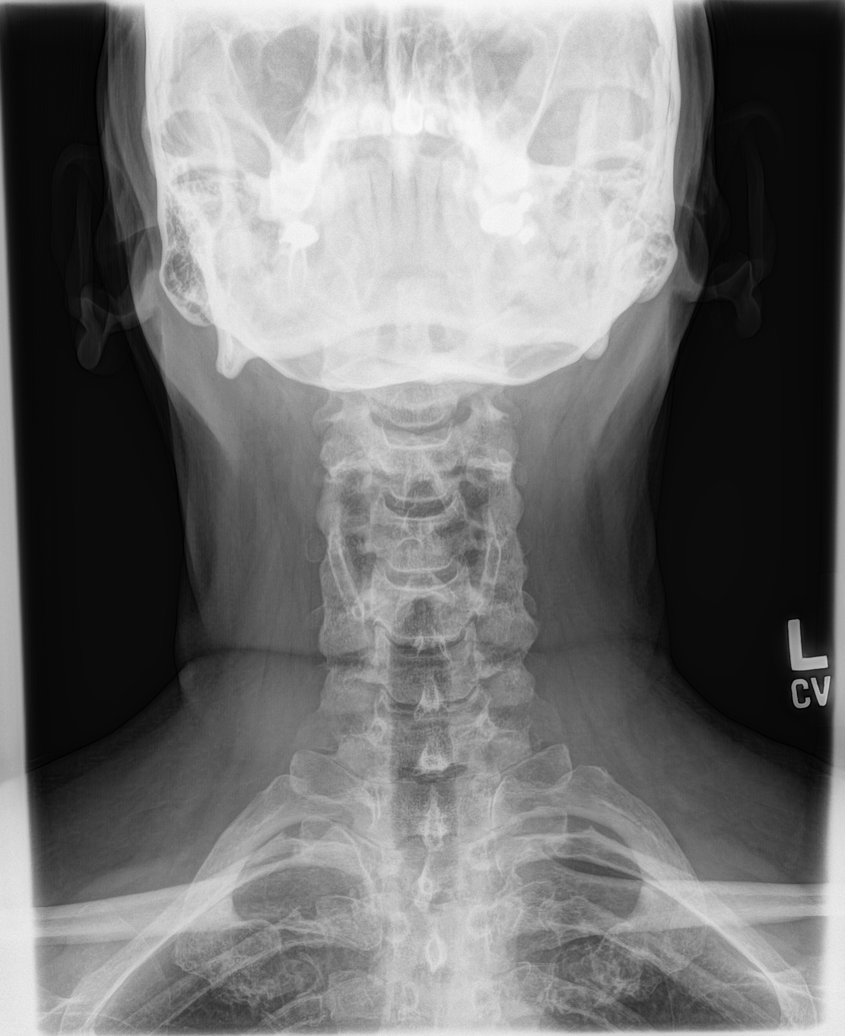
[im 5/6]
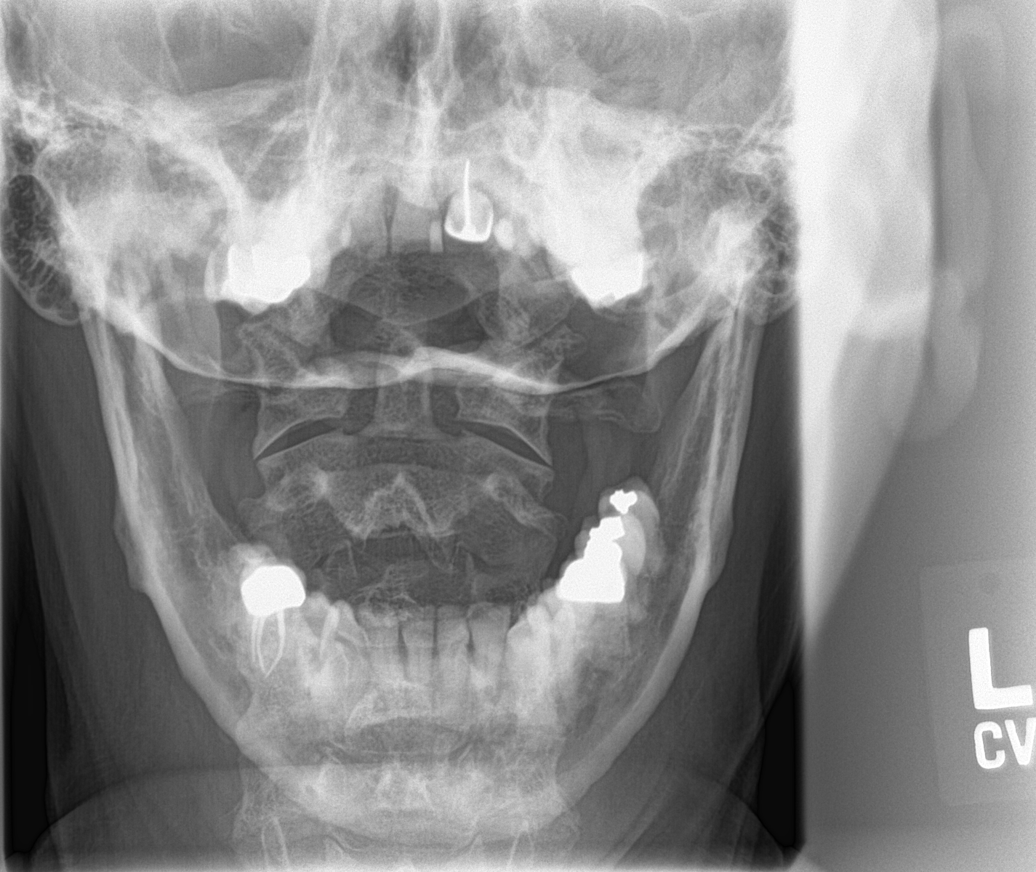
[im 6/6]
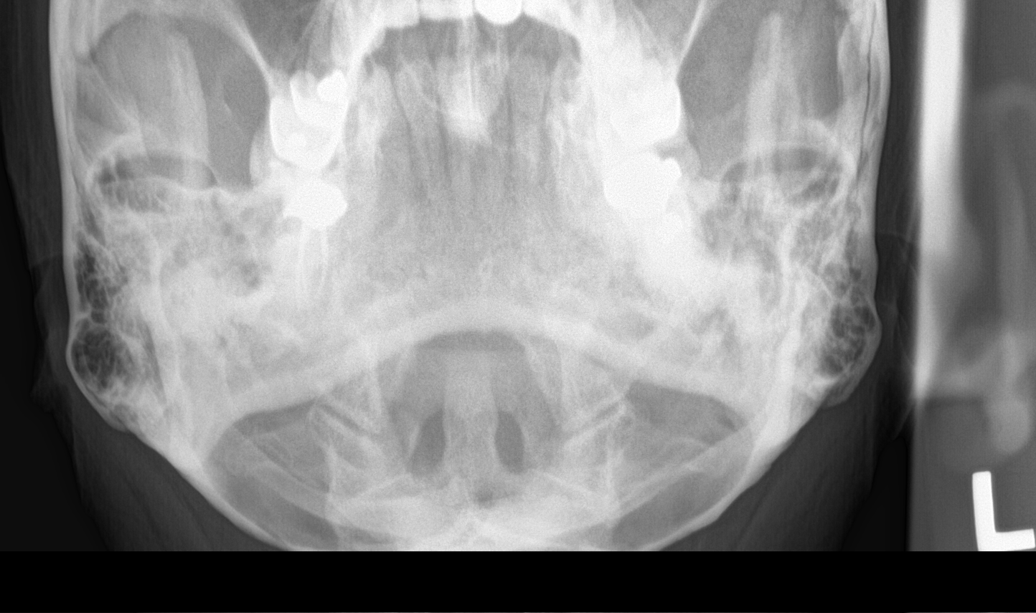

[6 of 6 positions shown; findings below may reference images not displayed]

FINDINGS: The cervical vertebrae are in normal alignment. There is mild
degenerative disc disease at C5-6 and C6-7 levels, where there is
some loss of disc space and spurring present. No prevertebral soft
tissue swelling is seen. On oblique views, minimal foraminal
narrowing is present on the right at C5-6 and C6-7. The odontoid
process is intact. The lung apices are clear.
IMPRESSION: 1. Mild degenerative disc disease at C5-6 and C6-7.
2. Normal alignment.

## 2017-08-02 IMAGING — CR DG SHOULDER 2+V*L*
1 series · 3 of 3 positions shown · non-contrast
Comparison: None.

CLINICAL DATA: Left shoulder pain for 1 month, no injury

EXAM:
LEFT SHOULDER - 2+ VIEW

[Series 1: dg shoulder left · 0.14mm/px · 3 of 3 slices shown]
[im 1/3]
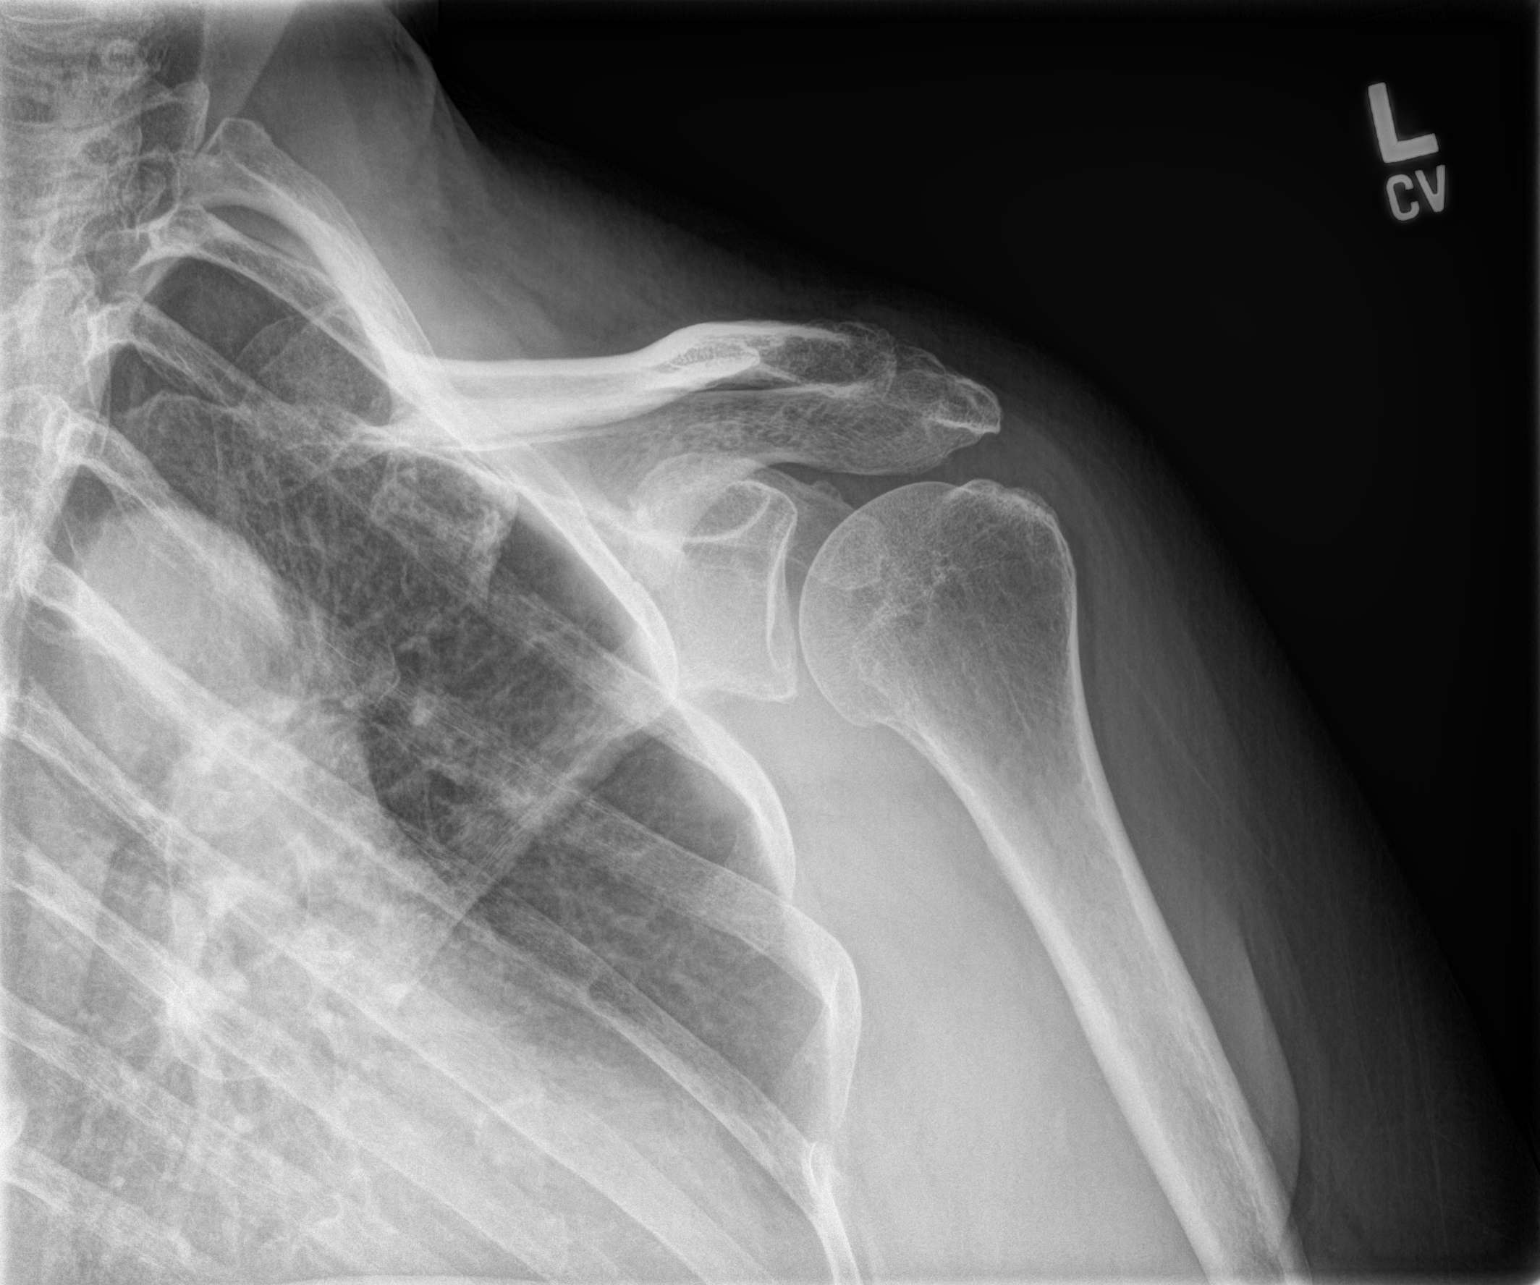
[im 2/3]
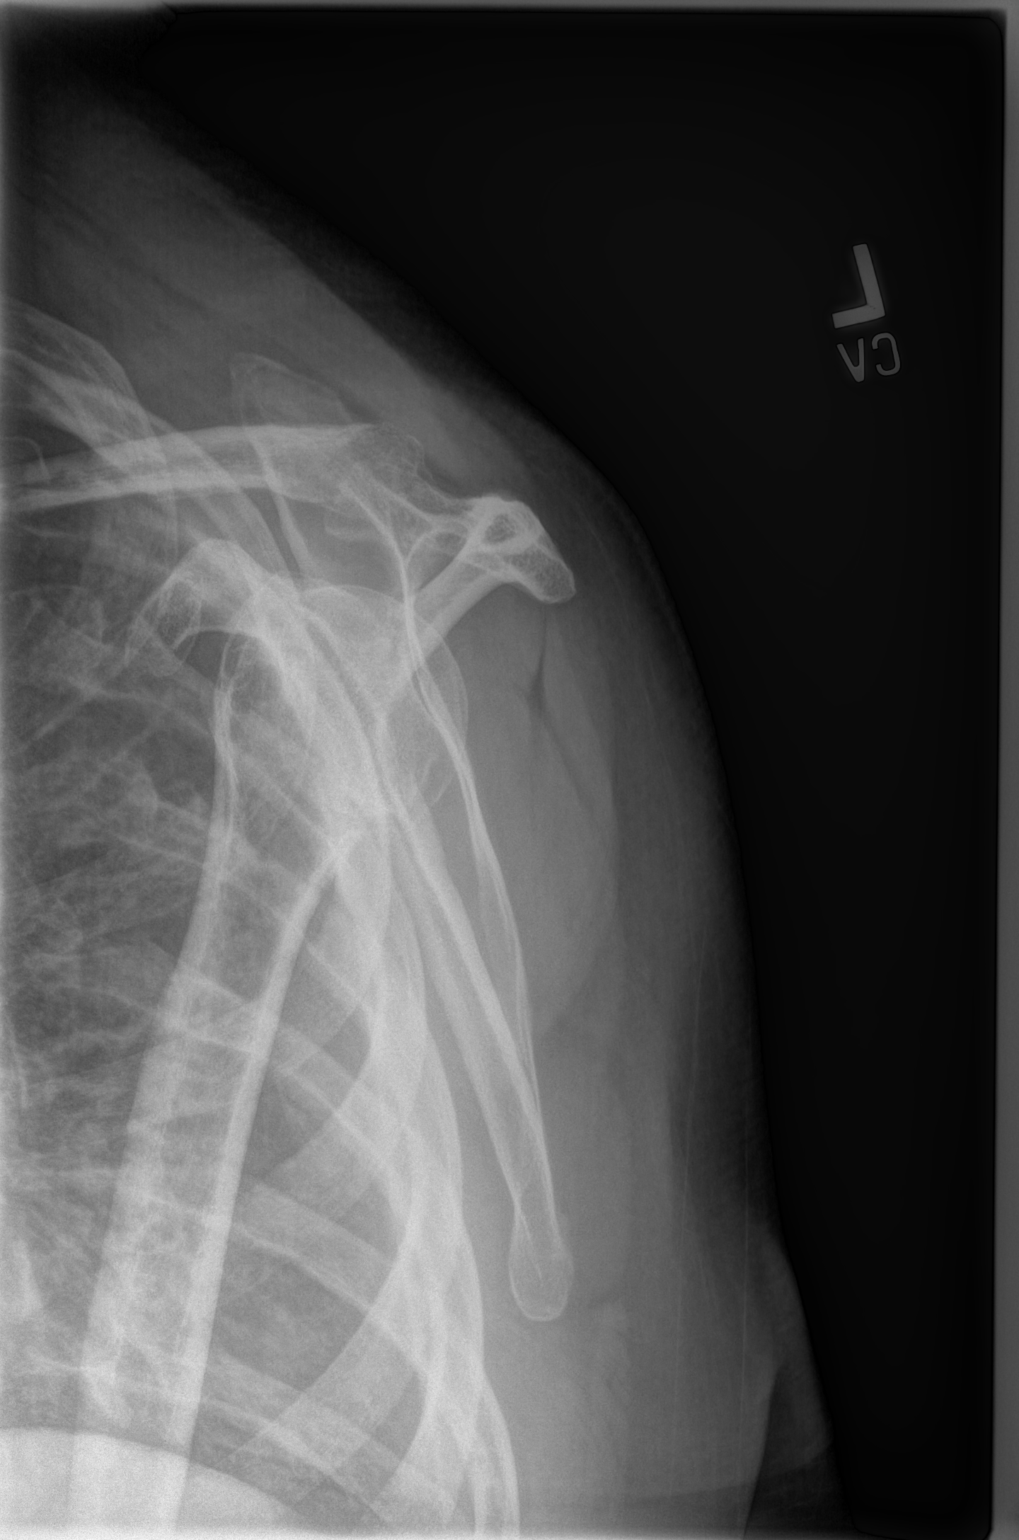
[im 3/3]
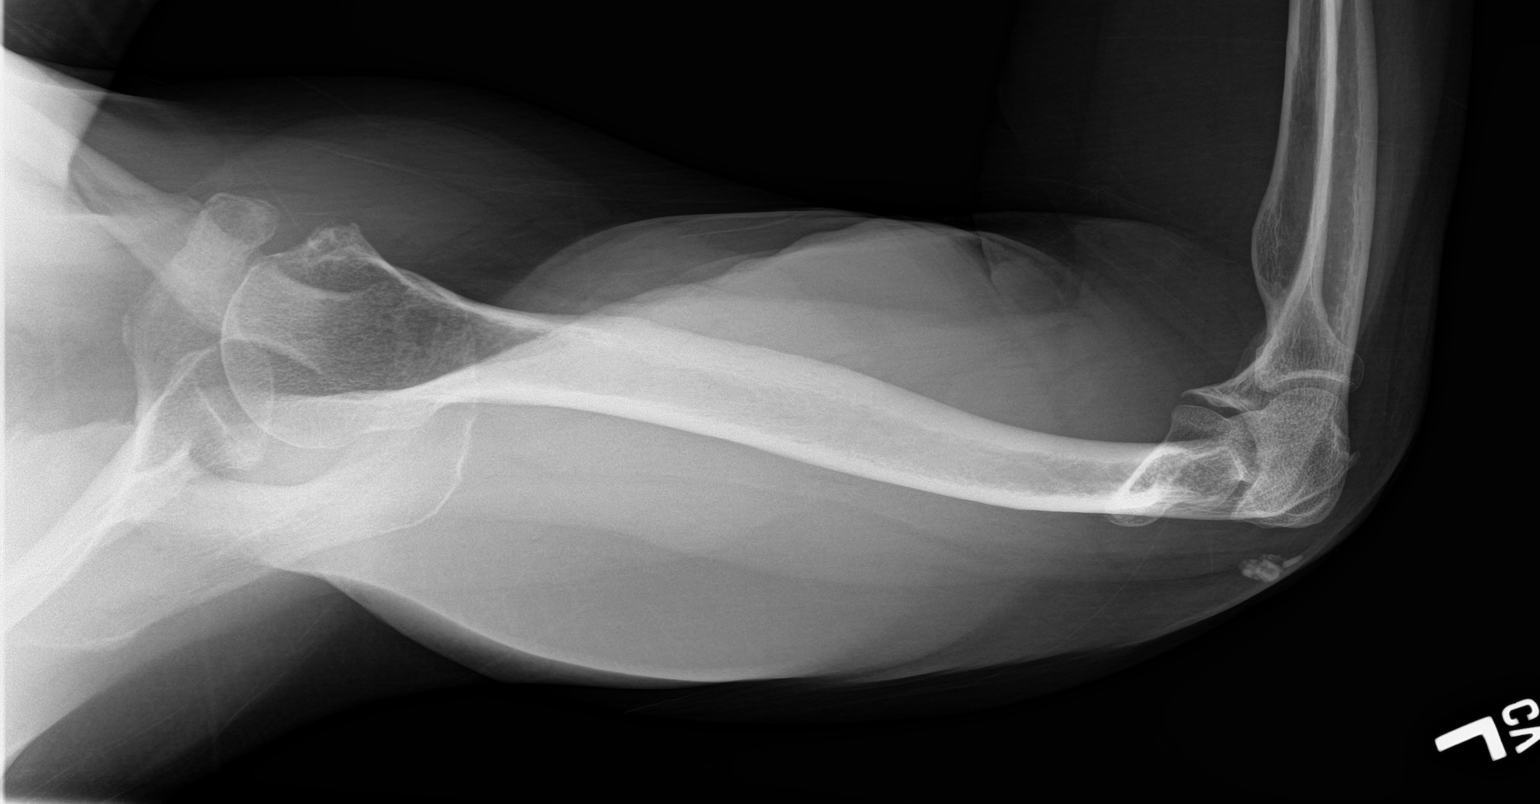

[3 of 3 positions shown; findings below may reference images not displayed]

FINDINGS: The left glenohumeral joint space is relatively well preserved for
age and the humeral head is intact. There is some degenerative
change involving the left AC joint. Calcifications are noted
posterior to the olecranon which are nonspecific but could be due to
chronic tendinitis/bursitis.
IMPRESSION: Mild degenerative change of the left AC joint. No acute abnormality.

## 2017-08-30 ENCOUNTER — Telehealth: Payer: Self-pay | Admitting: Nurse Practitioner

## 2017-08-30 NOTE — Telephone Encounter (Signed)
Received clearance paperwork for patient; need updated TSH prior to filling out paperwork. Per last note patient was to call in at home blood pressure readings and blood sugar readings for the past two weeks. Please ask for this information. Also need updated TSH for patient- she is due for this lab work on Monday but can come in earlier if needed.

## 2017-09-01 ENCOUNTER — Other Ambulatory Visit: Payer: Self-pay

## 2017-09-01 ENCOUNTER — Telehealth: Payer: Self-pay | Admitting: Emergency Medicine

## 2017-09-01 ENCOUNTER — Ambulatory Visit: Payer: Self-pay | Admitting: Nurse Practitioner

## 2017-09-01 ENCOUNTER — Ambulatory Visit: Payer: Self-pay

## 2017-09-01 DIAGNOSIS — E038 Other specified hypothyroidism: Secondary | ICD-10-CM

## 2017-09-01 LAB — TSH: TSH: 0.39 mIU/L — ABNORMAL LOW (ref 0.40–4.50)

## 2017-09-01 NOTE — Telephone Encounter (Signed)
Patient had blood sugar checked and stated it was 114 and blood pressure was 124/76. Please check on paperwork after labs come in. It is due by the 24th for her mission trip

## 2017-09-02 NOTE — Telephone Encounter (Signed)
Patient came in for labs and BP check

## 2017-09-08 ENCOUNTER — Telehealth: Payer: Self-pay | Admitting: Emergency Medicine

## 2017-09-08 NOTE — Telephone Encounter (Signed)
Patient notified paperwork is available for pick. Patient did labs and had BP check

## 2017-09-19 ENCOUNTER — Ambulatory Visit: Payer: Self-pay | Admitting: Nurse Practitioner

## 2017-09-19 ENCOUNTER — Encounter: Payer: Self-pay | Admitting: Nurse Practitioner

## 2017-09-19 VITALS — BP 120/80 | HR 79 | Temp 97.7°F | Resp 16 | Ht 66.0 in | Wt 269.3 lb

## 2017-09-19 DIAGNOSIS — Z1231 Encounter for screening mammogram for malignant neoplasm of breast: Secondary | ICD-10-CM

## 2017-09-19 DIAGNOSIS — I10 Essential (primary) hypertension: Secondary | ICD-10-CM

## 2017-09-19 DIAGNOSIS — E038 Other specified hypothyroidism: Secondary | ICD-10-CM

## 2017-09-19 DIAGNOSIS — J4520 Mild intermittent asthma, uncomplicated: Secondary | ICD-10-CM

## 2017-09-19 DIAGNOSIS — Z1239 Encounter for other screening for malignant neoplasm of breast: Secondary | ICD-10-CM

## 2017-09-19 MED ORDER — LEVOTHYROXINE SODIUM 137 MCG PO TABS: 125.0000 ug | ORAL_TABLET | Freq: Every day | ORAL | 0 refills | Status: DC

## 2017-09-19 MED ORDER — ALBUTEROL SULFATE HFA 108 (90 BASE) MCG/ACT IN AERS: 2.0000 | INHALATION_SPRAY | Freq: Four times a day (QID) | RESPIRATORY_TRACT | 2 refills | Status: DC | PRN

## 2017-09-19 NOTE — Progress Notes (Addendum)
Name: Sarah Gordon   MRN: [id number]    DOB: [birth year]   Date:09/19/2017       Progress Note  Subjective  Chief Complaint  Chief Complaint  Patient presents with  . Medication Refill    She need her proventil as well as her Levothyroxine.    HPI  Hypertension Takes lisinopril 10mg  daily no missed doses. States had more salt in diet than usually recently.  BP Readings from Last 3 Encounters:  09/19/17 120/80  07/26/17 138/82  02/25/17 (!) 189/98    Hypothyroidism Patient took 137 mcg daily last TSH was below goal so dose was decreased to half a pill on sundays. Has been doing half pill for 2 weeks. Sts chronic stable constipation, denies palpitations, cold/hot intolerances.   Lab Results  Component Value Date   TSH 0.39 (L) 09/01/2017    Asthma Patient states was mild and intermittent and relatively well controlled the last few years. States last 2 months has been more frequently- states sensitive to scents. Patient states granddaughter is staying with them and has certain products that are triggering her- coughing and wheezing.   Obesity Patient states has cutting down on gluten significantly- states did her own paleo type diet.   Wt Readings from Last 3 Encounters:  09/19/17 269 lb 4.8 oz (122.2 kg)  07/26/17 279 lb 6.4 oz (126.7 kg)  02/25/17 240 lb (108.9 kg)     Patient Active Problem List   Diagnosis Date Noted  . DDD (degenerative disc disease), cervical 01/23/2016  . Degenerative joint disease of left acromioclavicular joint 01/23/2016  . Recurrent oral herpes simplex infection 09/01/2015  . Irritable bowel syndrome with diarrhea 08/07/2015  . Statin intolerance 02/28/2015  . Asthma, intermittent 09/10/2014  . Controlled type 2 diabetes with neuropathy ([place]) 09/10/2014  . Fibromyalgia 09/10/2014  . Obstructive sleep apnea 09/10/2014  . Fluttering heart 12/29/2012  . Hyperlipidemia 12/29/2012  . Essential hypertension 12/29/2012  . Morbid obesity ([place])  12/29/2012  . Arthritis due to gout 12/13/2007  . Acid reflux 10/24/2006  . Adult hypothyroidism 10/24/2006  . Localized osteoarthrosis 10/24/2006    Past Medical History:  Diagnosis Date  . Asthma   . Esophageal reflux   . Gouty arthropathy, unspecified   . Herpes simplex without mention of complication   . Irritable bowel syndrome   . Localized osteoarthrosis not specified whether primary or secondary, unspecified site   . Migraine, unspecified, without mention of intractable migraine without mention of status migrainosus   . Mixed hyperlipidemia   . Myalgia and myositis, unspecified   . Obstructive sleep apnea (adult) (pediatric)   . Palpitations   . Type II or unspecified type diabetes mellitus without mention of complication, not stated as uncontrolled   . Umbilical hernia without mention of obstruction or gangrene   . Unspecified essential hypertension   . Unspecified hypothyroidism     Past Surgical History:  Procedure Laterality Date  . BREAST BIOPSY Left    benign  . COLONOSCOPY     Dr Alveta Heimlich  . COLONOSCOPY WITH PROPOFOL N/A 11/19/2015   Procedure: COLONOSCOPY WITH PROPOFOL;  Surgeon: Christene Lye, MD;  Location: [facility] ENDOSCOPY;  Service: Endoscopy;  Laterality: N/A;  . ESOPHAGOGASTRODUODENOSCOPY N/A 08/20/2014   Procedure: ESOPHAGOGASTRODUODENOSCOPY (EGD);  Surgeon: Christene Lye, MD;  Location: [facility] ENDOSCOPY;  Service: Endoscopy;  Laterality: N/A;  . FOOT SURGERY Left    heel spur  . HERNIA REPAIR     umbilical  . NASAL SINUS SURGERY  2671I  . UMBILICAL HERNIA REPAIR      Social History   Tobacco Use  . Smoking status: Never Smoker  . Smokeless tobacco: Never Used  Substance Use Topics  . Alcohol use: No    Alcohol/week: 0.0 oz    Comment: RARELY     Current Outpatient Medications:  .  levothyroxine (SYNTHROID, LEVOTHROID) 137 MCG tablet, Take 1 tablet (137 mcg total) by mouth daily. Please get repeat lab in 6 weeks on September 09 2017 for  refill, Disp: 90 tablet, Rfl: 0 .  lisinopril (PRINIVIL,ZESTRIL) 10 MG tablet, Take 0.5 tablets (5 mg total) by mouth daily., Disp: 90 tablet, Rfl: 0 .  NON FORMULARY, Take 2 capsules by mouth daily. MicroPlex Food Supplement, Disp: , Rfl:  .  NON FORMULARY, Take 2 capsules by mouth daily. Alpha CRS +, Disp: , Rfl:  .  omeprazole (PRILOSEC) 20 MG capsule, Take 1 capsule by mouth  daily, Disp: 90 capsule, Rfl: 0 .  OVER THE COUNTER MEDICATION, Take 1 capsule by mouth 2 (two) times daily. Deep blue polyphenol, Disp: , Rfl:  .  OVER THE COUNTER MEDICATION, Take 2 capsules by mouth every morning. XEO Mega, Disp: , Rfl:  .  OVER THE COUNTER MEDICATION, Take 1 capsule by mouth 2 (two) times daily. digestzen, Disp: , Rfl:  .  albuterol (PROVENTIL HFA;VENTOLIN HFA) 108 (90 Base) MCG/ACT inhaler, Inhale 2 puffs into the lungs every 6 (six) hours as needed for wheezing or shortness of breath., Disp: 1 Inhaler, Rfl: 2  Allergies  Allergen Reactions  . Aspir-81 [Aspirin] Other (See Comments)    Coagulation disorder  . Fluzone [Flu Virus Vaccine]   . Lipitor [Atorvastatin] Other (See Comments)    Joint pain  . Statins Other (See Comments)  . Topamax [Topiramate] Other (See Comments)    Cognitive issues   . Vascepa [Epa Ethyl Ester] Swelling and Other (See Comments)  . Voltaren [Diclofenac Sodium] Hives and Other (See Comments)    ROS  Constitutional: Negative for fever or weight change.  Respiratory: Negative for cough and Positive intermittent shortness of breath- asthma.   Cardiovascular: Negative for chest pain or palpitations.  Gastrointestinal: Negative for abdominal pain, no bowel changes.  Musculoskeletal: Negative for gait problem or joint swelling.  Skin: Negative for rash.  Neurological: Negative for dizziness or headache.  No other specific complaints in a complete review of systems (except as listed in HPI above).  Objective  Vitals:   09/19/17 0820 09/19/17 0840 09/19/17 0849   BP: (!) 142/80 140/90 120/80  Pulse: 79    Resp: 16    Temp: 97.7 F (36.5 C)    TempSrc: Oral    SpO2: 98%    Weight: 269 lb 4.8 oz (122.2 kg)    Height: 5\' 6"  (1.676 m)      Body mass index is 43.47 kg/m.  Nursing Note and Vital Signs reviewed.  Physical Exam   Constitutional: Patient appears well-developed and well-nourished. Obese  No distress.  HEENT: head atraumatic, normocephalic, pupils equal and reactive to light, no maxillary or frontal sinus tendernes , neck supple without lymphadenopathy, oropharynx pink and moist without exudate, no nasal discharge Cardiovascular: Normal rate, regular rhythm, S1/S2 present.  No murmur or rub heard.  Pulmonary/Chest: Effort normal and breath sounds clear. No respiratory distress or retractions. Psychiatric: Patient has a normal mood and affect. behavior is normal. Judgment and thought content normal.  No results found for this or any previous visit (from the past  72 hour(s)).  Assessment & Plan 1. Mild intermittent asthma without complication - albuterol (PROVENTIL HFA;VENTOLIN HFA) 108 (90 Base) MCG/ACT inhaler; Inhale 2 puffs into the lungs every 6 (six) hours as needed for wheezing or shortness of breath.  Dispense: 1 Inhaler; Refill: 2  2. Other specified hypothyroidism - levothyroxine (SYNTHROID, LEVOTHROID) 137 MCG tablet; Take 1 tablet (137 mcg total) by mouth daily. Please get repeat lab in 6 weeks on September 09 2017 for refill  Dispense: 90 tablet; Refill: 0  3. Essential hypertension DASH; BP decreased after re-checked when sitting after exam.  -continue lisinopril 10mg  daily   4. Screening for breast cancer - MM Digital Screening; Future   -Red flags and when to present for emergency care or RTC including fever >101.12F, chest pain, shortness of breath, new/worsening/un-resolving symptoms,  reviewed with patient at time of visit. Follow up and care instructions discussed and provided in AVS. -Reviewed Health  Maintenance: eye exam coming up   ---------------------------------- I have reviewed this encounter including the documentation in this note and/or discussed this patient with the provider, Suezanne Cheshire DNP AGNP-C. I am certifying that I agree with the content of this note as supervising physician. Enid Derry, [facility] 09/21/2017, 2:51 PM

## 2017-09-19 NOTE — Patient Instructions (Addendum)
Please do call to schedule your mammogram; the number to schedule one at either [facility] or [facility] is [phone]   Friend stands for "Dietary Approaches to Stop Hypertension." The DASH eating plan is a healthy eating plan that has been shown to reduce high blood pressure (hypertension). It may also reduce your risk for type 2 diabetes, heart disease, and stroke. The DASH eating plan may also help with weight loss. What are tips for following this plan? General guidelines  Avoid eating more than 2,300 mg (milligrams) of salt (sodium) a day. If you have hypertension, you may need to reduce your sodium intake to 1,500 mg a day.  Limit alcohol intake to no more than 1 drink a day for nonpregnant women and 2 drinks a day for men. One drink equals 12 oz of beer, 5 oz of wine, or 1 oz of hard liquor.  Work with your health care provider to maintain a healthy body weight or to lose weight. Ask what an ideal weight is for you.  Get at least 30 minutes of exercise that causes your heart to beat faster (aerobic exercise) most days of the week. Activities may include walking, swimming, or biking.  Work with your health care provider or diet and nutrition specialist (dietitian) to adjust your eating plan to your individual calorie needs. Reading food labels  Check food labels for the amount of sodium per serving. Choose foods with less than 5 percent of the Daily Value of sodium. Generally, foods with less than 300 mg of sodium per serving fit into this eating plan.  To find whole grains, look for the word "whole" as the first word in the ingredient list. Shopping  Buy products labeled as "low-sodium" or "no salt added."  Buy fresh foods. Avoid canned foods and premade or frozen meals. Cooking  Avoid adding salt when cooking. Use salt-free seasonings or herbs instead of table salt or sea salt. Check with your health care provider or pharmacist  before using salt substitutes.  Do not fry foods. Cook foods using healthy methods such as baking, boiling, grilling, and broiling instead.  Cook with heart-healthy oils, such as olive, canola, soybean, or sunflower oil. Meal planning   Eat a balanced diet that includes: ? 5 or more servings of fruits and vegetables each day. At each meal, try to fill half of your plate with fruits and vegetables. ? Up to 6-8 servings of whole grains each day. ? Less than 6 oz of lean meat, poultry, or fish each day. A 3-oz serving of meat is about the same size as a deck of cards. One egg equals 1 oz. ? 2 servings of low-fat dairy each day. ? A serving of nuts, seeds, or beans 5 times each week. ? Heart-healthy fats. Healthy fats called Omega-3 fatty acids are found in foods such as flaxseeds and coldwater fish, like sardines, salmon, and mackerel.  Limit how much you eat of the following: ? Canned or prepackaged foods. ? Food that is high in trans fat, such as fried foods. ? Food that is high in saturated fat, such as fatty meat. ? Sweets, desserts, sugary drinks, and other foods with added sugar. ? Full-fat dairy products.  Do not salt foods before eating.  Try to eat at least 2 vegetarian meals each week.  Eat more home-cooked food and less restaurant, buffet, and fast food.  When eating at a restaurant, ask that your food be prepared with  less salt or no salt, if possible. What foods are recommended? The items listed may not be a complete list. Talk with your dietitian about what dietary choices are best for you. Grains Whole-grain or whole-wheat bread. Whole-grain or whole-wheat pasta. Brown rice. [place]. Bulgur. Whole-grain and low-sodium cereals. Pita bread. Low-fat, low-sodium crackers. Whole-wheat flour tortillas. Vegetables Fresh or frozen vegetables (raw, steamed, roasted, or grilled). Low-sodium or reduced-sodium tomato and vegetable juice. Low-sodium or reduced-sodium tomato  sauce and tomato paste. Low-sodium or reduced-sodium canned vegetables. Fruits All fresh, dried, or frozen fruit. Canned fruit in natural juice (without added sugar). Meat and other protein foods Skinless chicken or [place]. Ground chicken or [place]. Pork with fat trimmed off. Fish and seafood. Egg whites. Dried beans, peas, or lentils. Unsalted nuts, nut butters, and seeds. Unsalted canned beans. Lean cuts of beef with fat trimmed off. Low-sodium, lean deli meat. Dairy Low-fat (1%) or fat-free (skim) milk. Fat-free, low-fat, or reduced-fat cheeses. Nonfat, low-sodium ricotta or cottage cheese. Low-fat or nonfat yogurt. Low-fat, low-sodium cheese. Fats and oils Soft margarine without trans fats. Vegetable oil. Low-fat, reduced-fat, or light mayonnaise and salad dressings (reduced-sodium). Canola, safflower, olive, soybean, and sunflower oils. Avocado. Seasoning and other foods Herbs. Spices. Seasoning mixes without salt. Unsalted popcorn and pretzels. Fat-free sweets. What foods are not recommended? The items listed may not be a complete list. Talk with your dietitian about what dietary choices are best for you. Grains Baked goods made with fat, such as croissants, muffins, or some breads. Dry pasta or rice meal packs. Vegetables Creamed or fried vegetables. Vegetables in a cheese sauce. Regular canned vegetables (not low-sodium or reduced-sodium). Regular canned tomato sauce and paste (not low-sodium or reduced-sodium). Regular tomato and vegetable juice (not low-sodium or reduced-sodium). Angie Fava. Olives. Fruits Canned fruit in a light or heavy syrup. Fried fruit. Fruit in cream or butter sauce. Meat and other protein foods Fatty cuts of meat. Ribs. Fried meat. Berniece Salines. Sausage. Bologna and other processed lunch meats. Salami. Fatback. Hotdogs. Bratwurst. Salted nuts and seeds. Canned beans with added salt. Canned or smoked fish. Whole eggs or egg yolks. Chicken or [place] with skin. Dairy Whole  or 2% milk, cream, and half-and-half. Whole or full-fat cream cheese. Whole-fat or sweetened yogurt. Full-fat cheese. Nondairy creamers. Whipped toppings. Processed cheese and cheese spreads. Fats and oils Butter. Stick margarine. Lard. Shortening. Ghee. Bacon fat. Tropical oils, such as coconut, palm kernel, or palm oil. Seasoning and other foods Salted popcorn and pretzels. Onion salt, garlic salt, seasoned salt, table salt, and sea salt. Worcestershire sauce. Tartar sauce. Barbecue sauce. Teriyaki sauce. Soy sauce, including reduced-sodium. Steak sauce. Canned and packaged gravies. Fish sauce. Oyster sauce. Cocktail sauce. Horseradish that you find on the shelf. Ketchup. Mustard. Meat flavorings and tenderizers. Bouillon cubes. Hot sauce and Tabasco sauce. Premade or packaged marinades. Premade or packaged taco seasonings. Relishes. Regular salad dressings. Where to find more information:  National Heart, Lung, and [place]: https://wilson-eaton.com/  American Heart Association: www.heart.org Summary  The DASH eating plan is a healthy eating plan that has been shown to reduce high blood pressure (hypertension). It may also reduce your risk for type 2 diabetes, heart disease, and stroke.  With the DASH eating plan, you should limit salt (sodium) intake to 2,300 mg a day. If you have hypertension, you may need to reduce your sodium intake to 1,500 mg a day.  When on the DASH eating plan, aim to eat more fresh fruits and vegetables, whole grains, lean proteins,  low-fat dairy, and heart-healthy fats.  Work with your health care provider or diet and nutrition specialist (dietitian) to adjust your eating plan to your individual calorie needs. This information is not intended to replace advice given to you by your health care provider. Make sure you discuss any questions you have with your health care provider. Document Released: 02/25/2011 Document Revised: 03/01/2016 Document Reviewed:  03/01/2016 Elsevier Interactive Patient Education  [employer].

## 2017-10-18 ENCOUNTER — Other Ambulatory Visit: Payer: Self-pay | Admitting: Nurse Practitioner

## 2017-10-18 DIAGNOSIS — K219 Gastro-esophageal reflux disease without esophagitis: Secondary | ICD-10-CM

## 2017-12-21 ENCOUNTER — Ambulatory Visit: Payer: Self-pay | Admitting: Family Medicine

## 2017-12-21 ENCOUNTER — Encounter: Payer: Self-pay | Admitting: Family Medicine

## 2017-12-21 VITALS — BP 138/88 | HR 86 | Temp 98.2°F | Resp 16 | Ht 66.0 in | Wt 262.7 lb

## 2017-12-21 DIAGNOSIS — E782 Mixed hyperlipidemia: Secondary | ICD-10-CM

## 2017-12-21 DIAGNOSIS — E1140 Type 2 diabetes mellitus with diabetic neuropathy, unspecified: Secondary | ICD-10-CM

## 2017-12-21 DIAGNOSIS — E039 Hypothyroidism, unspecified: Secondary | ICD-10-CM

## 2017-12-21 DIAGNOSIS — Z23 Encounter for immunization: Secondary | ICD-10-CM

## 2017-12-21 DIAGNOSIS — K219 Gastro-esophageal reflux disease without esophagitis: Secondary | ICD-10-CM

## 2017-12-21 DIAGNOSIS — I10 Essential (primary) hypertension: Secondary | ICD-10-CM

## 2017-12-21 DIAGNOSIS — J4520 Mild intermittent asthma, uncomplicated: Secondary | ICD-10-CM

## 2017-12-21 DIAGNOSIS — G4733 Obstructive sleep apnea (adult) (pediatric): Secondary | ICD-10-CM

## 2017-12-21 LAB — POCT GLYCOSYLATED HEMOGLOBIN (HGB A1C): HbA1c, POC (controlled diabetic range): 5.7 % (ref 0.0–7.0)

## 2017-12-21 MED ORDER — LISINOPRIL 10 MG PO TABS: 5.0000 mg | ORAL_TABLET | Freq: Every day | ORAL | 0 refills | Status: DC

## 2017-12-21 MED ORDER — OMEPRAZOLE 20 MG PO CPDR: DELAYED_RELEASE_CAPSULE | ORAL | 0 refills | Status: DC

## 2017-12-21 NOTE — Progress Notes (Signed)
Name: Sarah Gordon   MRN: [id number]    DOB: January 22, 1951   Date:12/21/2017       Progress Note  Subjective  Chief Complaint  Chief Complaint  Patient presents with  . Medication Refill    Uses [facility] and would like paper prescriptions  . Diabetes  . Hypothyroidism  . Hypertension    Denies any symptoms  . Hyperlipidemia  . Sleep Apnea  . Asthma    Well controlled    HPI  Hypothyroidism: she is feeling more tired than usual, TSH 6 month ago was elevated and 3 months ago was suppressed, she is now taking 137 mcg daily and half on Sundays. She is compliant, takes it first thing in am with water and not other medications.   DMII: she was diagnosed around 2000, she took Metformin but lost a lot of weight a couple of years ago and has been diet only, she states neuropathy is better , stopped gabapentin on her own because of cost , she is on Dultera oils. Today  hgbA1C was 5.7 %. No polyphagia, polydipsia or polyuria. She has been trying to follow a diabetic diet, she avoids gluten because of sensitivity to gluten   Hyperlipidemia: she is not on statin therapy because of side effects, last lipid panel was 10/2015. She does not have insurance and cannot afford medication or labs at this time  HTN:she is on lisinopril, denies chest pain or palpitation. BP is at goal.   OSA: not on CPAP, sleeping well again since she started taking magnesium, she could not tolerate CPAP machine  Morbid obesity: discussed life style modification. She is trying to eat healthy and will increase physical activity   GERD: under control with medication, no heartburn or indigestion. Reminded her long term risk of medication, but unable to stop medication  Mild intermittent asthma: no cough or wheezing at this time   Patient Active Problem List   Diagnosis Date Noted  . DDD (degenerative disc disease), cervical 01/23/2016  . Degenerative joint disease of left acromioclavicular joint 01/23/2016  .  Recurrent oral herpes simplex infection 09/01/2015  . Irritable bowel syndrome with diarrhea 08/07/2015  . Statin intolerance 02/28/2015  . Asthma, intermittent 09/10/2014  . Controlled type 2 diabetes with neuropathy ([place]) 09/10/2014  . Fibromyalgia 09/10/2014  . Obstructive sleep apnea 09/10/2014  . Fluttering heart 12/29/2012  . Hyperlipidemia 12/29/2012  . Essential hypertension 12/29/2012  . Morbid obesity ([place]) 12/29/2012  . Arthritis due to gout 12/13/2007  . Acid reflux 10/24/2006  . Adult hypothyroidism 10/24/2006  . Localized osteoarthrosis 10/24/2006    Past Surgical History:  Procedure Laterality Date  . BREAST BIOPSY Left    benign  . COLONOSCOPY     Dr Alveta Heimlich  . COLONOSCOPY WITH PROPOFOL N/A 11/19/2015   Procedure: COLONOSCOPY WITH PROPOFOL;  Surgeon: Christene Lye, MD;  Location: [facility] ENDOSCOPY;  Service: Endoscopy;  Laterality: N/A;  . ESOPHAGOGASTRODUODENOSCOPY N/A 08/20/2014   Procedure: ESOPHAGOGASTRODUODENOSCOPY (EGD);  Surgeon: Christene Lye, MD;  Location: [facility] ENDOSCOPY;  Service: Endoscopy;  Laterality: N/A;  . FOOT SURGERY Left    heel spur  . HERNIA REPAIR     umbilical  . NASAL SINUS SURGERY  1980s  . UMBILICAL HERNIA REPAIR      Family History  Problem Relation Age of Onset  . Hypertension Mother   . Hyperlipidemia Mother   . Heart disease Brother 50       CABG x 3   . Heart attack  Brother 29  . Lupus Sister     Social History   Socioeconomic History  . Marital status: Married    Spouse name: Georgena Spurling   . Number of children: 1  . Years of education: Not on file  . Highest education level: Some college, no degree  Occupational History  . Not on file  Social Needs  . Financial resource strain: Somewhat hard  . Food insecurity:    Worry: Never true    Inability: Never true  . Transportation needs:    Medical: No    Non-medical: No  Tobacco Use  . Smoking status: Never Smoker  . Smokeless tobacco: Never Used   Substance and Sexual Activity  . Alcohol use: No    Alcohol/week: 0.0 standard drinks    Comment: RARELY  . Drug use: No  . Sexual activity: Never  Lifestyle  . Physical activity:    Days per week: 5 days    Minutes per session: 20 min  . Stress: Not at all  Relationships  . Social connections:    Talks on phone: More than three times a week    Gets together: More than three times a week    Attends religious service: More than 4 times per year    Active member of club or organization: Yes    Attends meetings of clubs or organizations: More than 4 times per year    Relationship status: Married  . Intimate partner violence:    Fear of current or ex partner: No    Emotionally abused: No    Physically abused: No    Forced sexual activity: No  Other Topics Concern  . Not on file  Social History Narrative  . Not on file     Current Outpatient Medications:  .  levothyroxine (SYNTHROID, LEVOTHROID) 137 MCG tablet, Take 1 tablet (137 mcg total) by mouth daily. Please get repeat lab in 6 weeks on September 09 2017 for refill, Disp: 90 tablet, Rfl: 0 .  lisinopril (PRINIVIL,ZESTRIL) 10 MG tablet, Take 0.5 tablets (5 mg total) by mouth daily., Disp: 90 tablet, Rfl: 0 .  magnesium gluconate (MAGONATE) 500 MG tablet, Take 500 mg by mouth daily., Disp: , Rfl:  .  NON FORMULARY, Take 2 capsules by mouth daily. MicroPlex Food Supplement, Disp: , Rfl:  .  NON FORMULARY, Take 2 capsules by mouth daily. Alpha CRS +, Disp: , Rfl:  .  omeprazole (PRILOSEC) 20 MG capsule, TAKE 1 CAPSULE BY MOUTH ONCE DAILY, Disp: 90 capsule, Rfl: 0 .  OVER THE COUNTER MEDICATION, Take 1 capsule by mouth 2 (two) times daily. Deep blue polyphenol, Disp: , Rfl:  .  OVER THE COUNTER MEDICATION, Take 2 capsules by mouth every morning. XEO Mega, Disp: , Rfl:  .  OVER THE COUNTER MEDICATION, Take 1 capsule by mouth 2 (two) times daily. digestzen, Disp: , Rfl:  .  albuterol (PROVENTIL HFA;VENTOLIN HFA) 108 (90 Base) MCG/ACT  inhaler, Inhale 2 puffs into the lungs every 6 (six) hours as needed for wheezing or shortness of breath. (Patient not taking: Reported on 12/21/2017), Disp: 1 Inhaler, Rfl: 2  Allergies  Allergen Reactions  . Aspir-81 [Aspirin] Other (See Comments)    Coagulation disorder  . Lipitor [Atorvastatin] Other (See Comments)    Joint pain  . Statins Other (See Comments)  . Topamax [Topiramate] Other (See Comments)    Cognitive issues   . Vascepa [Epa Ethyl Ester] Swelling and Other (See Comments)  . Voltaren [Diclofenac Sodium]  Hives and Other (See Comments)    I personally reviewed active problem list, medication list, allergies, family history, social history with the patient/caregiver today.   ROS  Constitutional: Negative for fever , mild  weight change.  Respiratory: Negative for cough and shortness of breath.   Cardiovascular: Negative for chest pain or palpitations.  Gastrointestinal: Negative for abdominal pain, no bowel changes.  Musculoskeletal: Negative for gait problem or joint swelling.  Skin: Negative for rash.  Neurological: Negative for dizziness or headache.  No other specific complaints in a complete review of systems (except as listed in HPI above).  Objective  Vitals:   12/21/17 0831  BP: 138/88  Pulse: 86  Resp: 16  Temp: 98.2 F (36.8 C)  TempSrc: Oral  SpO2: 99%  Weight: 262 lb 11.2 oz (119.2 kg)  Height: 5\' 6"  (1.676 m)    Body mass index is 42.4 kg/m.  Physical Exam  Constitutional: Patient appears well-developed and well-nourished. Obese  No distress.  HEENT: head atraumatic, normocephalic, pupils equal and reactive to light, neck supple, throat within normal limits Cardiovascular: Normal rate, regular rhythm and normal heart sounds.  No murmur heard. No BLE edema. Pulmonary/Chest: Effort normal and breath sounds normal. No respiratory distress. Abdominal: Soft.  There is no tenderness. Psychiatric: Patient has a normal mood and affect.  behavior is normal. Judgment and thought content normal.  Recent Results (from the past 2160 hour(s))  POCT HgB A1C     Status: Normal   Collection Time: 12/21/17  8:48 AM  Result Value Ref Range   Hemoglobin A1C     HbA1c POC (<> result, manual entry)     HbA1c, POC (prediabetic range)     HbA1c, POC (controlled diabetic range) 5.7 0.0 - 7.0 %      PHQ2/9: Depression screen [facility] 2/9 12/21/2017 09/19/2017 07/26/2017 05/06/2016 01/23/2016  Decreased Interest 0 0 0 0 0  Down, Depressed, Hopeless 0 0 0 0 0  PHQ - 2 Score 0 0 0 0 0  Altered sleeping 1 - - - -  Tired, decreased energy 1 - - - -  Change in appetite 1 - - - -  Feeling bad or failure about yourself  0 - - - -  Trouble concentrating 0 - - - -  Moving slowly or fidgety/restless 0 - - - -  Suicidal thoughts 0 - - - -  PHQ-9 Score 3 - - - -  Difficult doing work/chores Not difficult at all - - - -     Fall Risk: Fall Risk  12/21/2017 09/19/2017 07/26/2017 05/06/2016 01/23/2016  Falls in the past year? No No No No Yes  Number falls in past yr: - - - - 1  Injury with Fall? - - - - No     Functional Status Survey: Is the patient deaf or have difficulty hearing?: No Does the patient have difficulty seeing, even when wearing glasses/contacts?: Yes Does the patient have difficulty concentrating, remembering, or making decisions?: No Does the patient have difficulty walking or climbing stairs?: No Does the patient have difficulty dressing or bathing?: No Does the patient have difficulty doing errands alone such as visiting a doctor's office or shopping?: No   Assessment & Plan  1. Controlled type 2 diabetes with neuropathy (HCC)  - POCT HgB A1C - Urine Microalbumin w/creat. ratio  2. Morbid obesity (HCC)  Lost 7 lbs since last visit  3. Need for immunization against influenza  refused  4. Essential hypertension  -  lisinopril (PRINIVIL,ZESTRIL) 10 MG tablet; Take 0.5 tablets (5 mg total) by mouth daily.  Dispense: 90  tablet; Refill: 0  5. Gastroesophageal reflux disease, esophagitis presence not specified  - omeprazole (PRILOSEC) 20 MG capsule; TAKE 1 CAPSULE BY MOUTH ONCE DAILY  Dispense: 90 capsule; Refill: 0  6. Adult hypothyroidism  - TSH  7. Mild intermittent asthma without complication  She is trying to get inhaler

## 2017-12-22 ENCOUNTER — Other Ambulatory Visit: Payer: Self-pay | Admitting: Family Medicine

## 2017-12-22 DIAGNOSIS — E038 Other specified hypothyroidism: Secondary | ICD-10-CM

## 2017-12-22 LAB — MICROALBUMIN / CREATININE URINE RATIO
Creatinine, Urine: 78 mg/dL (ref 20–275)
Microalb Creat Ratio: 3 mcg/mg creat (ref <30)
Microalb, Ur: 0.2 mg/dL

## 2017-12-22 LAB — TSH: TSH: 0.13 mIU/L — ABNORMAL LOW (ref 0.40–4.50)

## 2017-12-22 MED ORDER — LEVOTHYROXINE SODIUM 112 MCG PO TABS: 112.0000 ug | ORAL_TABLET | Freq: Every day | ORAL | 1 refills | Status: DC

## 2018-02-27 ENCOUNTER — Encounter: Payer: Self-pay | Admitting: Family Medicine

## 2018-02-27 ENCOUNTER — Ambulatory Visit (INDEPENDENT_AMBULATORY_CARE_PROVIDER_SITE_OTHER): Payer: Medicare Other | Admitting: Family Medicine

## 2018-02-27 VITALS — BP 150/90 | HR 88 | Temp 97.6°F | Resp 16 | Ht 66.0 in | Wt 262.8 lb

## 2018-02-27 DIAGNOSIS — R5383 Other fatigue: Secondary | ICD-10-CM

## 2018-02-27 DIAGNOSIS — J4520 Mild intermittent asthma, uncomplicated: Secondary | ICD-10-CM | POA: Diagnosis not present

## 2018-02-27 DIAGNOSIS — Z789 Other specified health status: Secondary | ICD-10-CM

## 2018-02-27 DIAGNOSIS — Z23 Encounter for immunization: Secondary | ICD-10-CM | POA: Diagnosis not present

## 2018-02-27 DIAGNOSIS — E1140 Type 2 diabetes mellitus with diabetic neuropathy, unspecified: Secondary | ICD-10-CM | POA: Diagnosis not present

## 2018-02-27 DIAGNOSIS — E782 Mixed hyperlipidemia: Secondary | ICD-10-CM

## 2018-02-27 DIAGNOSIS — E038 Other specified hypothyroidism: Secondary | ICD-10-CM

## 2018-02-27 DIAGNOSIS — I10 Essential (primary) hypertension: Secondary | ICD-10-CM

## 2018-02-27 DIAGNOSIS — G4733 Obstructive sleep apnea (adult) (pediatric): Secondary | ICD-10-CM

## 2018-02-27 MED ORDER — LISINOPRIL 10 MG PO TABS: 5.0000 mg | ORAL_TABLET | Freq: Every day | ORAL | 0 refills | Status: DC

## 2018-02-27 MED ORDER — LEVOTHYROXINE SODIUM 112 MCG PO TABS: 112.0000 ug | ORAL_TABLET | Freq: Every day | ORAL | 0 refills | Status: DC

## 2018-02-27 NOTE — Progress Notes (Signed)
Name: Sarah Gordon   MRN: [id number]    DOB: [birth year]   Date:02/27/2018       Progress Note  Subjective  Chief Complaint  Chief Complaint  Patient presents with  . Diabetes  . Hypertension  . Hypothyroidism    HPI  Hypothyroidism: she is feeling more tired than usual, TSH 6 month ago was elevated and 3 months ago was suppressed, she is currently taking 112 mcg daily and we will recheck level. She is compliant, takes it first thing in am with water and not other medications. She denies palpitation, no diarrhea, no change in appetite.   DMII: she was diagnosed around 2000, she took Metformin but lost a lot of weight a couple of years ago and has been diet only, she states neuropathy is better , stopped gabapentin on her own because of cost , she is on Dultera oils. Last visit hbA1C was 5.7%  No polyphagia, polydipsia or polyuria. She has been trying to follow a diabetic diet, she avoids gluten because of sensitivity to gluten   Hyperlipidemia: she is not on statin therapy because of side effects, last lipid panel was 10/2015. We will try Zetia if LDL is up, cannot tolerate Vascepa  HTN:she is on lisinopril, denies chest pain or palpitation. BP was at goal last visit and usually also at home, but wen to bed late celebrating her birthday, we rechecked and still elevated before she left, advised patient to return for bp check      OSA: not on CPAP, sleeping well again since she started taking magnesium, she could not tolerate CPAP machine in the past, discussed repeating study but she would like to hold off, it may be the cause of fatigue   Morbid obesity: discussed life style modification. She is trying to eat healthy and will increase physical activity   GERD: under control with medication, no heartburn or indigestion. Reminded her long term risk of medication, but unable to stop medication  Mild intermittent asthma: currently has mild URI with a cough slightly productive, clear in  color to milky, no fever or chills, no wheezing or SOB  Patient Active Problem List   Diagnosis Date Noted  . DDD (degenerative disc disease), cervical 01/23/2016  . Degenerative joint disease of left acromioclavicular joint 01/23/2016  . Recurrent oral herpes simplex infection 09/01/2015  . Irritable bowel syndrome with diarrhea 08/07/2015  . Statin intolerance 02/28/2015  . Asthma, intermittent 09/10/2014  . Controlled type 2 diabetes with neuropathy ([place]) 09/10/2014  . Fibromyalgia 09/10/2014  . Obstructive sleep apnea 09/10/2014  . Fluttering heart 12/29/2012  . Hyperlipidemia 12/29/2012  . Essential hypertension 12/29/2012  . Morbid obesity ([place]) 12/29/2012  . Arthritis due to gout 12/13/2007  . Acid reflux 10/24/2006  . Adult hypothyroidism 10/24/2006  . Localized osteoarthrosis 10/24/2006    Past Surgical History:  Procedure Laterality Date  . BREAST BIOPSY Left    benign  . COLONOSCOPY     Dr Alveta Heimlich  . COLONOSCOPY WITH PROPOFOL N/A 11/19/2015   Procedure: COLONOSCOPY WITH PROPOFOL;  Surgeon: Christene Lye, MD;  Location: [facility] ENDOSCOPY;  Service: Endoscopy;  Laterality: N/A;  . ESOPHAGOGASTRODUODENOSCOPY N/A 08/20/2014   Procedure: ESOPHAGOGASTRODUODENOSCOPY (EGD);  Surgeon: Christene Lye, MD;  Location: [facility] ENDOSCOPY;  Service: Endoscopy;  Laterality: N/A;  . FOOT SURGERY Left    heel spur  . HERNIA REPAIR     umbilical  . NASAL SINUS SURGERY  1980s  . UMBILICAL HERNIA REPAIR  Family History  Problem Relation Age of Onset  . Hypertension Mother   . Hyperlipidemia Mother   . Heart disease Brother 82       CABG x 3   . Heart attack Brother 23  . Lupus Sister     Social History   Socioeconomic History  . Marital status: Married    Spouse name: Georgena Spurling   . Number of children: 1  . Years of education: Not on file  . Highest education level: Some college, no degree  Occupational History  . Not on file  Social Needs  . Financial  resource strain: Somewhat hard  . Food insecurity:    Worry: Never true    Inability: Never true  . Transportation needs:    Medical: No    Non-medical: No  Tobacco Use  . Smoking status: Never Smoker  . Smokeless tobacco: Never Used  Substance and Sexual Activity  . Alcohol use: No    Alcohol/week: 0.0 standard drinks    Comment: RARELY  . Drug use: No  . Sexual activity: Never  Lifestyle  . Physical activity:    Days per week: 5 days    Minutes per session: 20 min  . Stress: Not at all  Relationships  . Social connections:    Talks on phone: More than three times a week    Gets together: More than three times a week    Attends religious service: More than 4 times per year    Active member of club or organization: Yes    Attends meetings of clubs or organizations: More than 4 times per year    Relationship status: Married  . Intimate partner violence:    Fear of current or ex partner: No    Emotionally abused: No    Physically abused: No    Forced sexual activity: No  Other Topics Concern  . Not on file  Social History Narrative  . Not on file     Current Outpatient Medications:  .  albuterol (PROVENTIL HFA;VENTOLIN HFA) 108 (90 Base) MCG/ACT inhaler, Inhale 2 puffs into the lungs every 6 (six) hours as needed for wheezing or shortness of breath., Disp: 1 Inhaler, Rfl: 2 .  levothyroxine (SYNTHROID, LEVOTHROID) 112 MCG tablet, Take 1 tablet (112 mcg total) by mouth daily. Please get repeat lab in 6 weeks on September 09 2017 for refill, Disp: 30 tablet, Rfl: 0 .  lisinopril (PRINIVIL,ZESTRIL) 10 MG tablet, Take 0.5 tablets (5 mg total) by mouth daily., Disp: 90 tablet, Rfl: 0 .  magnesium gluconate (MAGONATE) 500 MG tablet, Take 500 mg by mouth daily., Disp: , Rfl:  .  NON FORMULARY, Take 2 capsules by mouth daily. MicroPlex Food Supplement, Disp: , Rfl:  .  NON FORMULARY, Take 2 capsules by mouth daily. Alpha CRS +, Disp: , Rfl:  .  omeprazole (PRILOSEC) 20 MG capsule,  TAKE 1 CAPSULE BY MOUTH ONCE DAILY, Disp: 90 capsule, Rfl: 0 .  OVER THE COUNTER MEDICATION, Take 1 capsule by mouth 2 (two) times daily. Deep blue polyphenol, Disp: , Rfl:  .  OVER THE COUNTER MEDICATION, Take 2 capsules by mouth every morning. XEO Mega, Disp: , Rfl:  .  OVER THE COUNTER MEDICATION, Take 1 capsule by mouth 2 (two) times daily. digestzen, Disp: , Rfl:   Allergies  Allergen Reactions  . Aspir-81 [Aspirin] Other (See Comments)    Coagulation disorder  . Lipitor [Atorvastatin] Other (See Comments)    Joint pain  . Statins  Other (See Comments)  . Topamax [Topiramate] Other (See Comments)    Cognitive issues   . Vascepa [Epa Ethyl Ester] Swelling and Other (See Comments)  . Voltaren [Diclofenac Sodium] Hives and Other (See Comments)    I personally reviewed active problem list, medication list, allergies, family history, social history with the patient/caregiver today.   ROS  Constitutional: Negative for fever or weight change.  Respiratory: Negative for cough and shortness of breath.   Cardiovascular: Negative for chest pain or palpitations.  Gastrointestinal: Negative for abdominal pain, no bowel changes.  Musculoskeletal: Negative for gait problem or joint swelling.  Skin: Negative for rash.  Neurological: Negative for dizziness or headache.  No other specific complaints in a complete review of systems (except as listed in HPI above).   Objective  Vitals:   02/27/18 0755 02/27/18 0845  BP: (!) 150/98 (!) 150/90  Pulse: 88   Resp: 16   Temp: 97.6 F (36.4 C)   TempSrc: Oral   SpO2: 98%   Weight: 262 lb 12.8 oz (119.2 kg)   Height: 5\' 6"  (1.676 m)     Body mass index is 42.42 kg/m.  Physical Exam  Constitutional: Patient appears well-developed and well-nourished. Obese No distress.  HEENT: head atraumatic, normocephalic, pupils equal and reactive to light,  neck supple, throat within normal limits Cardiovascular: Normal rate, regular rhythm and  normal heart sounds.  No murmur heard. No BLE edema. Pulmonary/Chest: Effort normal and breath sounds normal. No respiratory distress. Abdominal: Soft.  There is no tenderness. Psychiatric: Patient has a normal mood and affect. behavior is normal. Judgment and thought content normal.  Recent Results (from the past 2160 hour(s))  POCT HgB A1C     Status: Normal   Collection Time: 12/21/17  8:48 AM  Result Value Ref Range   Hemoglobin A1C     HbA1c POC (<> result, manual entry)     HbA1c, POC (prediabetic range)     HbA1c, POC (controlled diabetic range) 5.7 0.0 - 7.0 %  TSH     Status: Abnormal   Collection Time: 12/21/17  9:18 AM  Result Value Ref Range   TSH 0.13 (L) 0.40 - 4.50 mIU/L  Microalbumin / creatinine urine ratio     Status: None   Collection Time: 12/21/17  9:18 AM  Result Value Ref Range   Creatinine, Urine 78 20 - 275 mg/dL   Microalb, Ur 0.2 mg/dL    Comment: Reference Range Not established    Microalb Creat Ratio 3 <30 mcg/mg creat    Comment: . The ADA defines abnormalities in albumin excretion as follows: Marland Kitchen Category         Result (mcg/mg creatinine) . Normal                    <30 Microalbuminuria         30-299  Clinical albuminuria   > OR = 300 . The ADA recommends that at least two of three specimens collected within a 3-6 month period be abnormal before considering a patient to be within a diagnostic category.      PHQ2/9: Depression screen [facility] 2/9 12/21/2017 09/19/2017 07/26/2017 05/06/2016 01/23/2016  Decreased Interest 0 0 0 0 0  Down, Depressed, Hopeless 0 0 0 0 0  PHQ - 2 Score 0 0 0 0 0  Altered sleeping 1 - - - -  Tired, decreased energy 1 - - - -  Change in appetite 1 - - - -  Feeling bad or failure about yourself  0 - - - -  Trouble concentrating 0 - - - -  Moving slowly or fidgety/restless 0 - - - -  Suicidal thoughts 0 - - - -  PHQ-9 Score 3 - - - -  Difficult doing work/chores Not difficult at all - - - -     Fall Risk: Fall  Risk  02/27/2018 12/21/2017 09/19/2017 07/26/2017 05/06/2016  Falls in the past year? 0 No No No No  Number falls in past yr: - - - - -  Injury with Fall? - - - - -     Assessment & Plan  1. Other specified hypothyroidism  - TSH - levothyroxine (SYNTHROID, LEVOTHROID) 112 MCG tablet; Take 1 tablet (112 mcg total) by mouth daily. Please get repeat lab in 6 weeks on September 09 2017 for refill  Dispense: 30 tablet; Refill: 0  2. Essential hypertension  - lisinopril (PRINIVIL,ZESTRIL) 10 MG tablet; Take 0.5 tablets (5 mg total) by mouth daily.  Dispense: 90 tablet; Refill: 0  3. Needs flu shot  - Flu vaccine HIGH DOSE PF  4. Need for vaccination for pneumococcus  - Pneumococcal polysaccharide vaccine 23-valent greater than or equal to 2yo subcutaneous/IM  5. Mixed hyperlipidemia  - Lipid panel  6. Controlled type 2 diabetes with neuropathy (HCC)  - COMPLETE METABOLIC PANEL WITH GFR  7. OSA (obstructive sleep apnea)  - CBC with Differential/Platelet  8. Mild intermittent asthma without complication  Mild cough from URI   9. Other fatigue  - Methylmalonic Acid  10. Engages in travel abroad  Discussed hepatitis A  11. Need for hepatitis B vaccination  - Hepatitis B vaccine adult IM

## 2018-03-01 ENCOUNTER — Other Ambulatory Visit: Payer: Self-pay | Admitting: Family Medicine

## 2018-03-01 DIAGNOSIS — E038 Other specified hypothyroidism: Secondary | ICD-10-CM

## 2018-03-01 LAB — COMPLETE METABOLIC PANEL WITH GFR
AG Ratio: 1.5 (calc) (ref 1.0–2.5)
ALT: 19 U/L (ref 6–29)
AST: 21 U/L (ref 10–35)
Albumin: 3.8 g/dL (ref 3.6–5.1)
Alkaline phosphatase (APISO): 76 U/L (ref 33–130)
BUN: 16 mg/dL (ref 7–25)
CO2: 27 mmol/L (ref 20–32)
Calcium: 9.3 mg/dL (ref 8.6–10.4)
Chloride: 108 mmol/L (ref 98–110)
Creat: 0.86 mg/dL (ref 0.50–0.99)
GFR, Est African American: 82 mL/min/{1.73_m2} (ref >=60)
GFR, Est Non African American: 71 mL/min/{1.73_m2} (ref >=60)
Globulin: 2.5 g/dL (calc) (ref 1.9–3.7)
Glucose, Bld: 121 mg/dL — ABNORMAL HIGH (ref 65–99)
Potassium: 5.2 mmol/L (ref 3.5–5.3)
Sodium: 141 mmol/L (ref 135–146)
Total Bilirubin: 0.4 mg/dL (ref 0.2–1.2)
Total Protein: 6.3 g/dL (ref 6.1–8.1)

## 2018-03-01 LAB — CBC WITH DIFFERENTIAL/PLATELET
Basophils Absolute: 31 cells/uL (ref 0–200)
Basophils Relative: 0.7 %
Eosinophils Absolute: 110 cells/uL (ref 15–500)
Eosinophils Relative: 2.5 %
HCT: 40.2 % (ref 35.0–45.0)
Hemoglobin: 13.1 g/dL (ref 11.7–15.5)
Lymphs Abs: 972 cells/uL (ref 850–3900)
MCH: 30 pg (ref 27.0–33.0)
MCHC: 32.6 g/dL (ref 32.0–36.0)
MCV: 92 fL (ref 80.0–100.0)
MPV: 10 fL (ref 7.5–12.5)
Monocytes Relative: 5.5 %
Neutro Abs: 3045 cells/uL (ref 1500–7800)
Neutrophils Relative %: 69.2 %
Platelets: 274 10*3/uL (ref 140–400)
RBC: 4.37 10*6/uL (ref 3.80–5.10)
RDW: 13.1 % (ref 11.0–15.0)
Total Lymphocyte: 22.1 %
WBC mixed population: 242 cells/uL (ref 200–950)
WBC: 4.4 10*3/uL (ref 3.8–10.8)

## 2018-03-01 LAB — LIPID PANEL
Cholesterol: 262 mg/dL — ABNORMAL HIGH (ref <200)
HDL: 36 mg/dL — ABNORMAL LOW (ref >50)
LDL Cholesterol (Calc): 183 mg/dL (calc) — ABNORMAL HIGH
Non-HDL Cholesterol (Calc): 226 mg/dL (calc) — ABNORMAL HIGH (ref <130)
Total CHOL/HDL Ratio: 7.3 (calc) — ABNORMAL HIGH (ref <5.0)
Triglycerides: 233 mg/dL — ABNORMAL HIGH (ref <150)

## 2018-03-01 LAB — TSH: TSH: 0.87 mIU/L (ref 0.40–4.50)

## 2018-03-01 LAB — METHYLMALONIC ACID, SERUM: Methylmalonic Acid, Quant: 154 nmol/L (ref 87–318)

## 2018-03-01 MED ORDER — EZETIMIBE 10 MG PO TABS: 10.0000 mg | ORAL_TABLET | Freq: Every day | ORAL | 0 refills | Status: DC

## 2018-03-01 MED ORDER — LEVOTHYROXINE SODIUM 112 MCG PO TABS: 112.0000 ug | ORAL_TABLET | Freq: Every day | ORAL | 0 refills | Status: DC

## 2018-04-06 ENCOUNTER — Ambulatory Visit (INDEPENDENT_AMBULATORY_CARE_PROVIDER_SITE_OTHER): Payer: Medicare Other | Admitting: Family Medicine

## 2018-04-06 ENCOUNTER — Encounter: Payer: Self-pay | Admitting: Family Medicine

## 2018-04-06 VITALS — BP 116/70 | HR 88 | Temp 98.0°F | Resp 16 | Ht 66.0 in | Wt 262.9 lb

## 2018-04-06 DIAGNOSIS — E1140 Type 2 diabetes mellitus with diabetic neuropathy, unspecified: Secondary | ICD-10-CM

## 2018-04-06 DIAGNOSIS — Z0000 Encounter for general adult medical examination without abnormal findings: Secondary | ICD-10-CM | POA: Diagnosis not present

## 2018-04-06 DIAGNOSIS — E2839 Other primary ovarian failure: Secondary | ICD-10-CM | POA: Diagnosis not present

## 2018-04-06 DIAGNOSIS — I10 Essential (primary) hypertension: Secondary | ICD-10-CM

## 2018-04-06 LAB — POCT GLYCOSYLATED HEMOGLOBIN (HGB A1C): Hemoglobin A1C: 5.8 % — AB (ref 4.0–5.6)

## 2018-04-06 NOTE — Progress Notes (Signed)
Patient: Sarah Gordon, Female    DOB: 1950/06/04, 68 y.o.   MRN: [id number]  Visit Date: 04/06/2018  Today's Provider: Loistine Chance, MD   Chief Complaint  Patient presents with  . Diabetes  . Hyperlipidemia  . Hypertension    Subjective:    HPI Sarah Gordon is a 68 y.o. female who presents today for her Subsequent Annual Wellness Visit .  Patient/Caregiver input:  No problems   Review of Systems  Constitutional: Negative for fever or weight change.  Respiratory: Negative for cough and shortness of breath.   Cardiovascular: Negative for chest pain or palpitations.  Gastrointestinal: Negative for abdominal pain, no bowel changes.  Musculoskeletal: Negative for gait problem or joint swelling.  Skin: Negative for rash.  Neurological: Negative for dizziness or headache.  No other specific complaints in a complete review of systems (except as listed in HPI above).  Past Medical History:  Diagnosis Date  . Allergy   . Asthma   . Cataract   . Esophageal reflux   . Gouty arthropathy, unspecified   . Herpes simplex without mention of complication   . Irritable bowel syndrome   . Localized osteoarthrosis not specified whether primary or secondary, unspecified site   . Migraine, unspecified, without mention of intractable migraine without mention of status migrainosus   . Mixed hyperlipidemia   . Myalgia and myositis, unspecified   . Obstructive sleep apnea (adult) (pediatric)   . Palpitations   . Type II or unspecified type diabetes mellitus without mention of complication, not stated as uncontrolled   . Umbilical hernia without mention of obstruction or gangrene   . Unspecified essential hypertension   . Unspecified hypothyroidism     Past Surgical History:  Procedure Laterality Date  . BREAST BIOPSY Left    benign  . COLONOSCOPY     Dr Alveta Heimlich  . COLONOSCOPY WITH PROPOFOL N/A 11/19/2015   Procedure: COLONOSCOPY WITH PROPOFOL;  Surgeon: Christene Lye, MD;   Location: [facility] ENDOSCOPY;  Service: Endoscopy;  Laterality: N/A;  . ESOPHAGOGASTRODUODENOSCOPY N/A 08/20/2014   Procedure: ESOPHAGOGASTRODUODENOSCOPY (EGD);  Surgeon: Christene Lye, MD;  Location: [facility] ENDOSCOPY;  Service: Endoscopy;  Laterality: N/A;  . FOOT SURGERY Left    heel spur  . HERNIA REPAIR     umbilical  . NASAL SINUS SURGERY  1980s  . UMBILICAL HERNIA REPAIR      Family History  Problem Relation Age of Onset  . Hypertension Mother   . Hyperlipidemia Mother   . Diabetes Mother   . Heart disease Mother   . Stroke Mother   . Heart disease Brother 18       CABG x 3   . Heart attack Brother 4  . Lupus Sister   . Arthritis Sister   . Asthma Sister   . Depression Sister   . Diabetes Brother   . Hypertension Brother   . Obesity Brother   . Early death Brother   . Miscarriages / Stillbirths Sister   . Stroke Maternal Grandfather     Social History   Socioeconomic History  . Marital status: Married    Spouse name: Georgena Spurling   . Number of children: 1  . Years of education: Not on file  . Highest education level: Some college, no degree  Occupational History  . Not on file  Social Needs  . Financial resource strain: Somewhat hard  . Food insecurity:    Worry: Never true    Inability: Never true  .  Transportation needs:    Medical: No    Non-medical: No  Tobacco Use  . Smoking status: Never Smoker  . Smokeless tobacco: Never Used  Substance and Sexual Activity  . Alcohol use: No    Alcohol/week: 0.0 standard drinks    Comment: RARELY  . Drug use: No  . Sexual activity: Never  Lifestyle  . Physical activity:    Days per week: 5 days    Minutes per session: 20 min  . Stress: Not at all  Relationships  . Social connections:    Talks on phone: More than three times a week    Gets together: More than three times a week    Attends religious service: More than 4 times per year    Active member of club or organization: Yes    Attends meetings of  clubs or organizations: More than 4 times per year    Relationship status: Married  . Intimate partner violence:    Fear of current or ex partner: No    Emotionally abused: No    Physically abused: No    Forced sexual activity: No  Other Topics Concern  . Not on file  Social History Narrative  . Not on file    Outpatient Encounter Medications as of 04/06/2018  Medication Sig  . albuterol (PROVENTIL HFA;VENTOLIN HFA) 108 (90 Base) MCG/ACT inhaler Inhale 2 puffs into the lungs every 6 (six) hours as needed for wheezing or shortness of breath.  . ezetimibe (ZETIA) 10 MG tablet Take 1 tablet (10 mg total) by mouth daily.  Marland Kitchen levothyroxine (SYNTHROID, LEVOTHROID) 112 MCG tablet Take 1 tablet (112 mcg total) by mouth daily.  Marland Kitchen lisinopril (PRINIVIL,ZESTRIL) 10 MG tablet Take 0.5 tablets (5 mg total) by mouth daily.  . magnesium gluconate (MAGONATE) 500 MG tablet Take 500 mg by mouth daily.  . NON FORMULARY Take 2 capsules by mouth daily. MicroPlex Food Supplement  . NON FORMULARY Take 2 capsules by mouth daily. Alpha CRS +  . omeprazole (PRILOSEC) 20 MG capsule TAKE 1 CAPSULE BY MOUTH ONCE DAILY  . OVER THE COUNTER MEDICATION Take 1 capsule by mouth 2 (two) times daily. Deep blue polyphenol  . OVER THE COUNTER MEDICATION Take 2 capsules by mouth every morning. XEO Mega  . OVER THE COUNTER MEDICATION Take 1 capsule by mouth 2 (two) times daily. digestzen   No facility-administered encounter medications on file as of 04/06/2018.     Allergies  Allergen Reactions  . Aspir-81 [Aspirin] Other (See Comments)    Coagulation disorder  . Lipitor [Atorvastatin] Other (See Comments)    Joint pain  . Statins Other (See Comments)  . Topamax [Topiramate] Other (See Comments)    Cognitive issues   . Vascepa [Icosapent Ethyl] Swelling and Other (See Comments)  . Voltaren [Diclofenac Sodium] Hives and Other (See Comments)    Care Team Updated in EHR: Yes  Last Vision Exam: today Wears corrective  lenses: Yes Last Dental Exam: not sure of the date - about 18 months ago, she will go back  Last Hearing Exam: today  Wears Hearing Aids: No  Functional Ability / Safety Screening 1.  Was the timed Get Up and Go test shorter than 30 seconds?  yes 2.  Does the patient need help with the phone, transportation, shopping,      preparing meals, housework, laundry, medications, or managing money?  yes 3.  Is the patient's home free of loose throw rugs in walkways, pet beds, electrical cords, etc?  yes      Grab bars in the bathroom? no      Handrails on the stairs?   no only one step going in and out of the house      Adequate lighting?   yes 4.  Has the patient noticed any hearing difficulties?   no  Diet Recall and Exercise Regimen:   Sedentary  She is eating gluten free now, eating a lot of chicken, vegetables and fruit, sweets occasionally ( she resumed a healthy diet recently   Advanced Care Planning: A voluntary discussion about advance care planning including the explanation and discussion of advance directives.  Discussed health care proxy and Living will, and the patient was able to identify a health care proxy as husband .  Patient does not have a living will at present time. Does patient have a HCPOA?    no If yes, name and contact information: N/A Does patient have a living will or MOST form?  no  Cancer Screenings: Skin: discussed atypical lesions  Lung:  Low Dose CT Chest recommended if Age 13-80 years, 30 pack-year currently smoking OR have quit w/in 15years. Patient does not qualify. Breast:  Up to date on Mammogram? No  Up to date of Bone Density/Dexa? No Colon: up to date, done 2017 , repeat in 5 years   Additional Screenings:  Hepatitis B/HIV/Syphillis:N/A Hepatitis C Screening: up to date Intimate Partner Violence: negative screen   Objective:   Vitals: BP 116/70 (BP Location: Left Arm, Patient Position: Sitting, Cuff Size: Large)   Pulse 88   Temp 98 F (36.7  C) (Oral)   Resp 16   Ht 5\' 6"  (1.676 m)   Wt 262 lb 14.4 oz (119.3 kg)   SpO2 98%   BMI 42.43 kg/m  Body mass index is 42.43 kg/m.  No exam data present  Physical Exam Constitutional: Patient appears well-developed and well-nourished. Obese  No distress.  HEENT: head atraumatic, normocephalic, pupils equal and reactive to light,  neck supple, throat within normal limits Cardiovascular: Normal rate, regular rhythm and normal heart sounds.  No murmur heard. No BLE edema. Pulmonary/Chest: Effort normal and breath sounds normal. No respiratory distress. Abdominal: Soft.  There is no tenderness. Psychiatric: Patient has a normal mood and affect. behavior is normal. Judgment and thought content normal.  Cognitive Testing - 6-CIT  Correct? Score   What year is it? yes 0 Yes = 0    No = 4  What month is it? yes 0 Yes = 0    No = 3  Remember:     Pia Mau, [place], [place]     What time is it? yes 0 Yes = 0    No = 3  Count backwards from 20 to 1 yes 0 Correct = 0    1 error = 2   More than 1 error = 4  Say the months of the year in reverse. yes 0 Correct = 0    1 error = 2   More than 1 error = 4  What address did I ask you to remember? yes 0 Correct = 0  1 error = 2    2 error = 4    3 error = 6    4 error = 8    All wrong = 10       TOTAL SCORE  0/28   Interpretation:  Normal  Normal (0-7) Abnormal (8-28)   Fall Risk: Fall Risk  04/06/2018 02/27/2018 12/21/2017 09/19/2017 07/26/2017  Falls in the past year? 0 0 No No No  Number falls in past yr: 0 - - - -  Injury with Fall? 0 - - - -    Depression Screen Depression screen [facility] 2/9 12/21/2017 09/19/2017 07/26/2017 05/06/2016 01/23/2016  Decreased Interest 0 0 0 0 0  Down, Depressed, Hopeless 0 0 0 0 0  PHQ - 2 Score 0 0 0 0 0  Altered sleeping 1 - - - -  Tired, decreased energy 1 - - - -  Change in appetite 1 - - - -  Feeling bad or failure about yourself  0 - - - -  Trouble concentrating 0 - - - -  Moving slowly or  fidgety/restless 0 - - - -  Suicidal thoughts 0 - - - -  PHQ-9 Score 3 - - - -  Difficult doing work/chores Not difficult at all - - - -    Recent Results (from the past 2160 hour(s))  TSH     Status: None   Collection Time: 02/27/18  8:49 AM  Result Value Ref Range   TSH 0.87 0.40 - 4.50 mIU/L  Lipid panel     Status: Abnormal   Collection Time: 02/27/18  8:49 AM  Result Value Ref Range   Cholesterol 262 (H) <200 mg/dL   HDL 36 (L) >50 mg/dL   Triglycerides 233 (H) <150 mg/dL    Comment: . If a non-fasting specimen was collected, consider repeat triglyceride testing on a fasting specimen if clinically indicated.  Yates Decamp et al. J. of Clin. Lipidol. 2536;6:440-347. Marland Kitchen    LDL Cholesterol (Calc) 183 (H) mg/dL (calc)    Comment: Reference range: <100 . Desirable range <100 mg/dL for primary prevention;   <70 mg/dL for patients with CHD or diabetic patients  with > or = 2 CHD risk factors. Marland Kitchen LDL-C is now calculated using the Martin-Hopkins  calculation, which is a validated novel method providing  better accuracy than the Friedewald equation in the  estimation of LDL-C.  Cresenciano Genre et al. Annamaria Helling. 4259;563(87): 2061-2068  (http://education.[facility].com/faq/FAQ164)    Total CHOL/HDL Ratio 7.3 (H) <5.0 (calc)   Non-HDL Cholesterol (Calc) 226 (H) <130 mg/dL (calc)    Comment: Non-HDL level > or = 220 is very high and may indicate  genetic familial hypercholesterolemia (FH). Clinical  assessment and measurement of blood lipid levels  should be considered for all first-degree relatives  of patients with an FH diagnosis. . For patients with diabetes plus 1 major ASCVD risk  factor, treating to a non-HDL-C goal of <100 mg/dL  (LDL-C of <70 mg/dL) is considered a therapeutic  option.   COMPLETE METABOLIC PANEL WITH GFR     Status: Abnormal   Collection Time: 02/27/18  8:49 AM  Result Value Ref Range   Glucose, Bld 121 (H) 65 - 99 mg/dL    Comment: .            Fasting  reference interval . For someone without known diabetes, a glucose value between 100 and 125 mg/dL is consistent with prediabetes and should be confirmed with a follow-up test. .    BUN 16 7 - 25 mg/dL   Creat 0.86 0.50 - 0.99 mg/dL    Comment: For patients >37 years of age, the reference limit for Creatinine is approximately 13% higher for people identified as African-American. .    GFR, Est Non African American 71 > OR = 60 mL/min/1.20m2   GFR,  Est African American 82 > OR = 60 mL/min/1.37m2   BUN/Creatinine Ratio NOT APPLICABLE 6 - 22 (calc)   Sodium 141 135 - 146 mmol/L   Potassium 5.2 3.5 - 5.3 mmol/L   Chloride 108 98 - 110 mmol/L   CO2 27 20 - 32 mmol/L   Calcium 9.3 8.6 - 10.4 mg/dL   Total Protein 6.3 6.1 - 8.1 g/dL   Albumin 3.8 3.6 - 5.1 g/dL   Globulin 2.5 1.9 - 3.7 g/dL (calc)   AG Ratio 1.5 1.0 - 2.5 (calc)   Total Bilirubin 0.4 0.2 - 1.2 mg/dL   Alkaline phosphatase (APISO) 76 33 - 130 U/L   AST 21 10 - 35 U/L   ALT 19 6 - 29 U/L  CBC with Differential/Platelet     Status: None   Collection Time: 02/27/18  8:49 AM  Result Value Ref Range   WBC 4.4 3.8 - 10.8 Thousand/uL   RBC 4.37 3.80 - 5.10 Million/uL   Hemoglobin 13.1 11.7 - 15.5 g/dL   HCT 40.2 35.0 - 45.0 %   MCV 92.0 80.0 - 100.0 fL   MCH 30.0 27.0 - 33.0 pg   MCHC 32.6 32.0 - 36.0 g/dL   RDW 13.1 11.0 - 15.0 %   Platelets 274 140 - 400 Thousand/uL   MPV 10.0 7.5 - 12.5 fL   Neutro Abs 3,045 1,500 - 7,800 cells/uL   Lymphs Abs 972 850 - 3,900 cells/uL   WBC mixed population 242 200 - 950 cells/uL   Eosinophils Absolute 110 15 - 500 cells/uL   Basophils Absolute 31 0 - 200 cells/uL   Neutrophils Relative % 69.2 %   Total Lymphocyte 22.1 %   Monocytes Relative 5.5 %   Eosinophils Relative 2.5 %   Basophils Relative 0.7 %  Methylmalonic Acid     Status: None   Collection Time: 02/27/18  8:49 AM  Result Value Ref Range   Methylmalonic Acid, Quant 154 87 - 318 nmol/L    Comment: . This test  was developed and its analytical performance characteristics have been determined by [place], [place]. It has not been cleared or approved by the U.S. Food and Drug Administration. This assay has been validated pursuant to the CLIA regulations and is used for clinical purposes. Marland Kitchen   POCT HgB A1C     Status: Abnormal   Collection Time: 04/06/18  9:25 AM  Result Value Ref Range   Hemoglobin A1C 5.8 (A) 4.0 - 5.6 %   HbA1c POC (<> result, manual entry)     HbA1c, POC (prediabetic range)     HbA1c, POC (controlled diabetic range)      Hearing Screening   125Hz  250Hz  500Hz  1000Hz  2000Hz  3000Hz  4000Hz  6000Hz  8000Hz   Right ear:   Pass Pass Pass  Fail    Left ear:   Pass Pass Pass  Fail      Visual Acuity Screening   Right eye Left eye Both eyes  Without correction:     With correction: 20/25 20/20 20/15     Assessment & Plan:    1. Medicare annual wellness visit, initial  - EKG 12-Lead - NSR  - DG Bone Density; Future  2. Controlled type 2 diabetes with neuropathy (HCC)  - POCT HgB A1C  3. Essential hypertension  - EKG 12-Lead  4. Ovarian failure  - DG Bone Density; Future   Exercise Activities and Dietary recommendations  Goals for the next year:   She wants  to lose 20 lbs in the next year She will walk 3 times a week for 15 minutes She will decrease portion size  - Discussed health benefits of physical activity, and encouraged her to engage in regular exercise appropriate for her age and condition.   Immunization History  Administered Date(s) Administered  . Hepatitis B, adult 02/27/2018  . Influenza, High Dose Seasonal PF 02/27/2018  . Pneumococcal Conjugate-13 07/30/2013  . Pneumococcal Polysaccharide-23 10/12/2011, 02/27/2018  . Tdap 10/12/2011  . Zoster 08/06/2010    Health Maintenance  Topic Date Due  . OPHTHALMOLOGY EXAM  08/03/2016  . MAMMOGRAM  11/19/2016  . PAP SMEAR-Modifier  05/23/2018  . HIV Screening   11/10/2018 (Originally 02/28/1966)  . HEMOGLOBIN A1C  06/22/2018  . FOOT EXAM  07/27/2018  . TETANUS/TDAP  10/11/2021  . COLONOSCOPY  11/18/2025  . INFLUENZA VACCINE  Completed  . DEXA SCAN  Completed  . Hepatitis C Screening  Completed  . PNA vac Low Risk Adult  Completed    No orders of the defined types were placed in this encounter.   Current Outpatient Medications:  .  albuterol (PROVENTIL HFA;VENTOLIN HFA) 108 (90 Base) MCG/ACT inhaler, Inhale 2 puffs into the lungs every 6 (six) hours as needed for wheezing or shortness of breath., Disp: 1 Inhaler, Rfl: 2 .  ezetimibe (ZETIA) 10 MG tablet, Take 1 tablet (10 mg total) by mouth daily., Disp: 90 tablet, Rfl: 0 .  levothyroxine (SYNTHROID, LEVOTHROID) 112 MCG tablet, Take 1 tablet (112 mcg total) by mouth daily., Disp: 90 tablet, Rfl: 0 .  lisinopril (PRINIVIL,ZESTRIL) 10 MG tablet, Take 0.5 tablets (5 mg total) by mouth daily., Disp: 90 tablet, Rfl: 0 .  magnesium gluconate (MAGONATE) 500 MG tablet, Take 500 mg by mouth daily., Disp: , Rfl:  .  NON FORMULARY, Take 2 capsules by mouth daily. MicroPlex Food Supplement, Disp: , Rfl:  .  NON FORMULARY, Take 2 capsules by mouth daily. Alpha CRS +, Disp: , Rfl:  .  omeprazole (PRILOSEC) 20 MG capsule, TAKE 1 CAPSULE BY MOUTH ONCE DAILY, Disp: 90 capsule, Rfl: 0 .  OVER THE COUNTER MEDICATION, Take 1 capsule by mouth 2 (two) times daily. Deep blue polyphenol, Disp: , Rfl:  .  OVER THE COUNTER MEDICATION, Take 2 capsules by mouth every morning. XEO Mega, Disp: , Rfl:  .  OVER THE COUNTER MEDICATION, Take 1 capsule by mouth 2 (two) times daily. digestzen, Disp: , Rfl:  There are no discontinued medications.  I have personally reviewed and addressed the Medicare Annual Wellness health risk assessment questionnaire and have noted the following in the patient's chart:  A.         Medical and social history & family history B.         Use of alcohol, tobacco, and illicit drugs  C.          Current medications and supplements D.         Functional and Cognitive ability and status E.         Nutritional status F.         Physical activity G.        Advance directives H.         List of other physicians I.          Hospitalizations, surgeries, and ER visits in previous 12 months J.         Vitals K.         Screenings such as hearing,  vision, cognitive function, and depression L.         Referrals and appointments: she will schedule mammogram, eye exam   In addition, I have reviewed and discussed with patient certain preventive protocols, quality metrics, and best practice recommendations. A written personalized care plan for preventive services as well as general preventive health recommendations were provided to patient.   See attached scanned questionnaire for additional information.

## 2018-04-06 NOTE — Patient Instructions (Signed)
Preventive Care 66 Years and Older, Female Preventive care refers to lifestyle choices and visits with your health care provider that can promote health and wellness. What does preventive care include?  A yearly physical exam. This is also called an annual well check.  Dental exams once or twice a year.  Routine eye exams. Ask your health care provider how often you should have your eyes checked.  Personal lifestyle choices, including: ? Daily care of your teeth and gums. ? Regular physical activity. ? Eating a healthy diet. ? Avoiding tobacco and drug use. ? Limiting alcohol use. ? Practicing safe sex. ? Taking low-dose aspirin every day. ? Taking vitamin and mineral supplements as recommended by your health care provider. What happens during an annual well check? The services and screenings done by your health care provider during your annual well check will depend on your age, overall health, lifestyle risk factors, and family history of disease. Counseling Your health care provider may ask you questions about your:  Alcohol use.  Tobacco use.  Drug use.  Emotional well-being.  Home and relationship well-being.  Sexual activity.  Eating habits.  History of falls.  Memory and ability to understand (cognition).  Work and work Statistician.  Reproductive health.  Screening You may have the following tests or measurements:  Height, weight, and BMI.  Blood pressure.  Lipid and cholesterol levels. These may be checked every 5 years, or more frequently if you are over 30 years old.  Skin check.  Lung cancer screening. You may have this screening every year starting at age 27 if you have a 30-pack-year history of smoking and currently smoke or have quit within the past 15 years.  Colorectal cancer screening. All adults should have this screening starting at age 33 and continuing until age 46. You will have tests every 1-10 years, depending on your results and the  type of screening test. People at increased risk should start screening at an earlier age. Screening tests may include: ? Guaiac-based fecal occult blood testing. ? Fecal immunochemical test (FIT). ? Stool DNA test. ? Virtual colonoscopy. ? Sigmoidoscopy. During this test, a flexible tube with a tiny camera (sigmoidoscope) is used to examine your rectum and lower colon. The sigmoidoscope is inserted through your anus into your rectum and lower colon. ? Colonoscopy. During this test, a long, thin, flexible tube with a tiny camera (colonoscope) is used to examine your entire colon and rectum.  Hepatitis C blood test.  Hepatitis B blood test.  Sexually transmitted disease (STD) testing.  Diabetes screening. This is done by checking your blood sugar (glucose) after you have not eaten for a while (fasting). You may have this done every 1-3 years.  Bone density scan. This is done to screen for osteoporosis. You may have this done starting at age 37.  Mammogram. This may be done every 1-2 years. Talk to your health care provider about how often you should have regular mammograms. Talk with your health care provider about your test results, treatment options, and if necessary, the need for more tests. Vaccines Your health care provider may recommend certain vaccines, such as:  Influenza vaccine. This is recommended every year.  Tetanus, diphtheria, and acellular pertussis (Tdap, Td) vaccine. You may need a Td booster every 10 years.  Varicella vaccine. You may need this if you have not been vaccinated.  Zoster vaccine. You may need this after age 38.  Measles, mumps, and rubella (MMR) vaccine. You may need at least  one dose of MMR if you were born in 1957 or later. You may also need a second dose.  Pneumococcal 13-valent conjugate (PCV13) vaccine. One dose is recommended after age 24.  Pneumococcal polysaccharide (PPSV23) vaccine. One dose is recommended after age 24.  Meningococcal  vaccine. You may need this if you have certain conditions.  Hepatitis A vaccine. You may need this if you have certain conditions or if you travel or work in places where you may be exposed to hepatitis A.  Hepatitis B vaccine. You may need this if you have certain conditions or if you travel or work in places where you may be exposed to hepatitis B.  Haemophilus influenzae type b (Hib) vaccine. You may need this if you have certain conditions. Talk to your health care provider about which screenings and vaccines you need and how often you need them. This information is not intended to replace advice given to you by your health care provider. Make sure you discuss any questions you have with your health care provider. Document Released: 04/04/2015 Document Revised: 04/28/2017 Document Reviewed: 01/07/2015 Elsevier Interactive Patient Education  2019 [employer].

## 2018-04-24 ENCOUNTER — Other Ambulatory Visit: Payer: Self-pay | Admitting: Family Medicine

## 2018-04-24 DIAGNOSIS — K219 Gastro-esophageal reflux disease without esophagitis: Secondary | ICD-10-CM

## 2018-05-25 ENCOUNTER — Other Ambulatory Visit: Payer: Self-pay | Admitting: Family Medicine

## 2018-05-25 NOTE — Telephone Encounter (Signed)
Refill Request for Cholesterol medication. Zetia 10 mg   Last physical: 04/06/2018  Lab Results  Component Value Date   CHOL 262 (H) 02/27/2018   HDL 36 (L) 02/27/2018   LDLCALC 183 (H) 02/27/2018   TRIG 233 (H) 02/27/2018   CHOLHDL 7.3 (H) 02/27/2018    Follow up:   08/04/2018

## 2018-05-28 ENCOUNTER — Encounter: Payer: Self-pay | Admitting: *Deleted

## 2018-05-28 ENCOUNTER — Other Ambulatory Visit: Payer: Self-pay

## 2018-05-28 DIAGNOSIS — Y929 Unspecified place or not applicable: Secondary | ICD-10-CM | POA: Insufficient documentation

## 2018-05-28 DIAGNOSIS — E119 Type 2 diabetes mellitus without complications: Secondary | ICD-10-CM | POA: Insufficient documentation

## 2018-05-28 DIAGNOSIS — J45909 Unspecified asthma, uncomplicated: Secondary | ICD-10-CM | POA: Diagnosis not present

## 2018-05-28 DIAGNOSIS — E039 Hypothyroidism, unspecified: Secondary | ICD-10-CM | POA: Diagnosis not present

## 2018-05-28 DIAGNOSIS — Y999 Unspecified external cause status: Secondary | ICD-10-CM | POA: Diagnosis not present

## 2018-05-28 DIAGNOSIS — Y939 Activity, unspecified: Secondary | ICD-10-CM | POA: Diagnosis not present

## 2018-05-28 DIAGNOSIS — I10 Essential (primary) hypertension: Secondary | ICD-10-CM | POA: Diagnosis not present

## 2018-05-28 DIAGNOSIS — Z79899 Other long term (current) drug therapy: Secondary | ICD-10-CM | POA: Diagnosis not present

## 2018-05-28 DIAGNOSIS — S61212A Laceration without foreign body of right middle finger without damage to nail, initial encounter: Secondary | ICD-10-CM | POA: Diagnosis not present

## 2018-05-28 DIAGNOSIS — W269XXA Contact with unspecified sharp object(s), initial encounter: Secondary | ICD-10-CM | POA: Insufficient documentation

## 2018-05-28 NOTE — ED Triage Notes (Signed)
Patient's states she lacerated 3rd finger on right hand tonight on a coffee mug. Bleeding is controlled with dressing.

## 2018-05-29 ENCOUNTER — Emergency Department: Payer: Medicare Other

## 2018-05-29 ENCOUNTER — Emergency Department
Admission: EM | Admit: 2018-05-29 | Discharge: 2018-05-29 | Disposition: A | Discharge status: Home / Self Care | Payer: Medicare Other | Attending: Emergency Medicine | Admitting: Emergency Medicine

## 2018-05-29 DIAGNOSIS — S61212A Laceration without foreign body of right middle finger without damage to nail, initial encounter: Secondary | ICD-10-CM | POA: Diagnosis not present

## 2018-05-29 MED ORDER — ACETAMINOPHEN 500 MG PO TABS
1000.0000 mg | ORAL_TABLET | Freq: Once | ORAL | Status: AC
  Administered 2018-05-29: 1000 mg via ORAL
  Filled 2018-05-29: qty 2

## 2018-05-29 MED ORDER — LIDOCAINE HCL (PF) 1 % IJ SOLN
10.0000 mL | Freq: Once | INTRAMUSCULAR | Status: AC
  Administered 2018-05-29: 10 mL via INTRADERMAL
  Filled 2018-05-29: qty 10

## 2018-05-29 NOTE — Discharge Instructions (Addendum)
Keep laceration dry and clean. Wash with warm water and soap. Apply topical bacitracin. Protect from the sun to minimize scarring. You received 1 stithces that must be removed in 7 days.  Watch for signs of infection: pus, redness of the skin surrounding it, or fever. If these develop see your doctor or return to the ER for antibiotics.

## 2018-05-29 NOTE — ED Provider Notes (Signed)
[facility] Emergency Department Provider Note  ____________________________________________  Time seen: Approximately 5:17 AM  I have reviewed the triage vital signs and the nursing notes.   HISTORY  Chief Complaint Laceration   HPI Sarah Gordon is a 68 y.o. female with history as listed below who presents for evaluation of finger laceration.  Patient reports that she cut her finger on a coffee mug earlier this evening.  She is complained of moderate constant throbbing and sharp pain.  She was afraid that the cut was very deep which prompted her visit to the emergency room.  Tetanus shot is up-to-date.  Past Medical History:  Diagnosis Date  . Allergy   . Asthma   . Cataract   . Esophageal reflux   . Gouty arthropathy, unspecified   . Herpes simplex without mention of complication   . Irritable bowel syndrome   . Localized osteoarthrosis not specified whether primary or secondary, unspecified site   . Migraine, unspecified, without mention of intractable migraine without mention of status migrainosus   . Mixed hyperlipidemia   . Myalgia and myositis, unspecified   . Obstructive sleep apnea (adult) (pediatric)   . Palpitations   . Type II or unspecified type diabetes mellitus without mention of complication, not stated as uncontrolled   . Umbilical hernia without mention of obstruction or gangrene   . Unspecified essential hypertension   . Unspecified hypothyroidism     Patient Active Problem List   Diagnosis Date Noted  . DDD (degenerative disc disease), cervical 01/23/2016  . Degenerative joint disease of left acromioclavicular joint 01/23/2016  . Recurrent oral herpes simplex infection 09/01/2015  . Irritable bowel syndrome with diarrhea 08/07/2015  . Statin intolerance 02/28/2015  . Asthma, intermittent 09/10/2014  . Controlled type 2 diabetes with neuropathy ([place]) 09/10/2014  . Fibromyalgia 09/10/2014  . Obstructive sleep apnea  09/10/2014  . Fluttering heart 12/29/2012  . Hyperlipidemia 12/29/2012  . Essential hypertension 12/29/2012  . Morbid obesity ([place]) 12/29/2012  . Arthritis due to gout 12/13/2007  . Acid reflux 10/24/2006  . Adult hypothyroidism 10/24/2006  . Localized osteoarthrosis 10/24/2006    Past Surgical History:  Procedure Laterality Date  . BREAST BIOPSY Left    benign  . COLONOSCOPY     Dr Alveta Heimlich  . COLONOSCOPY WITH PROPOFOL N/A 11/19/2015   Procedure: COLONOSCOPY WITH PROPOFOL;  Surgeon: Christene Lye, MD;  Location: [facility] ENDOSCOPY;  Service: Endoscopy;  Laterality: N/A;  . ESOPHAGOGASTRODUODENOSCOPY N/A 08/20/2014   Procedure: ESOPHAGOGASTRODUODENOSCOPY (EGD);  Surgeon: Christene Lye, MD;  Location: [facility] ENDOSCOPY;  Service: Endoscopy;  Laterality: N/A;  . FOOT SURGERY Left    heel spur  . HERNIA REPAIR     umbilical  . NASAL SINUS SURGERY  1980s  . UMBILICAL HERNIA REPAIR      Prior to Admission medications   Medication Sig Start Date End Date Taking? Authorizing Provider  albuterol (PROVENTIL HFA;VENTOLIN HFA) 108 (90 Base) MCG/ACT inhaler Inhale 2 puffs into the lungs every 6 (six) hours as needed for wheezing or shortness of breath. 09/19/17   Poulose, Bethel Born, NP  ezetimibe (ZETIA) 10 MG tablet Take 1 tablet by mouth once daily 05/25/18   Steele Sizer, MD  levothyroxine (SYNTHROID, LEVOTHROID) 112 MCG tablet Take 1 tablet (112 mcg total) by mouth daily. 03/01/18   Steele Sizer, MD  lisinopril (PRINIVIL,ZESTRIL) 10 MG tablet Take 0.5 tablets (5 mg total) by mouth daily. 02/27/18   Steele Sizer, MD  magnesium gluconate (MAGONATE) 500  MG tablet Take 500 mg by mouth daily.    [provider]  NON FORMULARY Take 2 capsules by mouth daily. MicroPlex Food Supplement    [provider]  NON FORMULARY Take 2 capsules by mouth daily. Alpha CRS +    [provider]  omeprazole (PRILOSEC) 20 MG capsule TAKE 1 CAPSULE BY MOUTH ONCE DAILY 04/24/18    Steele Sizer, MD  OVER THE COUNTER MEDICATION Take 1 capsule by mouth 2 (two) times daily. Deep blue polyphenol    [provider]  OVER THE COUNTER MEDICATION Take 2 capsules by mouth every morning. XEO Mega    [provider]  OVER THE COUNTER MEDICATION Take 1 capsule by mouth 2 (two) times daily. digestzen    [provider]    Allergies Aspir-81 [aspirin]; Lipitor [atorvastatin]; Statins; Topamax [topiramate]; Vascepa [icosapent ethyl]; and Voltaren [diclofenac sodium]  Family History  Problem Relation Age of Onset  . Hypertension Mother   . Hyperlipidemia Mother   . Diabetes Mother   . Heart disease Mother   . Stroke Mother   . Heart disease Brother 3       CABG x 3   . Heart attack Brother 33  . Lupus Sister   . Arthritis Sister   . Asthma Sister   . Depression Sister   . Diabetes Brother   . Hypertension Brother   . Obesity Brother   . Early death Brother   . Miscarriages / Stillbirths Sister   . Stroke Maternal Grandfather     Social History Social History   Tobacco Use  . Smoking status: Never Smoker  . Smokeless tobacco: Never Used  Substance Use Topics  . Alcohol use: No    Alcohol/week: 0.0 standard drinks    Comment: RARELY  . Drug use: No    Review of Systems  Constitutional: Negative for fever. Eyes: Negative for visual changes. ENT: Negative for sore throat. Neck: No neck pain  Cardiovascular: Negative for chest pain. Respiratory: Negative for shortness of breath. Musculoskeletal: Negative for back pain. Skin: Negative for rash. + finger laceration  ____________________________________________   PHYSICAL EXAM:  VITAL SIGNS: ED Triage Vitals  Enc Vitals Group     BP 05/28/18 2202 (!) 167/101     Pulse Rate 05/28/18 2202 82     Resp 05/28/18 2202 18     Temp 05/28/18 2202 98.4 F (36.9 C)     Temp Source 05/28/18 2202 Oral     SpO2 05/28/18 2202 98 %     Weight 05/28/18 2203 264 lb (119.7 kg)      Height 05/28/18 2203 5\' 6"  (1.676 m)     Head Circumference --      Peak Flow --      Pain Score 05/28/18 2203 6     Pain Loc --      Pain Edu? --      Excl. in [place]? --     Constitutional: Alert and oriented. Well appearing and in no apparent distress. HEENT:      Head: Normocephalic and atraumatic.         Eyes: Conjunctivae are normal. Sclera is non-icteric.       Mouth/Throat: Mucous membranes are moist.       Neck: Supple with no signs of meningismus. Cardiovascular: Regular rate and rhythm.  Respiratory: Normal respiratory effort. Lungs are clear to auscultation bilaterally. No wheezes, crackles, or rhonchi.  Musculoskeletal: Superficial laceration of the palmar aspect of the right  third finger with no foreign bodies or tendon involvement.  Neurovascular exam is intact. Neurologic: Normal speech and language. Face is symmetric. Moving all extremities. No gross focal neurologic deficits are appreciated. Skin: Skin is warm, dry and intact. No rash noted. Psychiatric: Mood and affect are normal. Speech and behavior are normal.  ____________________________________________   LABS (all labs ordered are listed, but only abnormal results are displayed)  Labs Reviewed - No data to display ____________________________________________  EKG  none  ____________________________________________  RADIOLOGY  I have personally reviewed the images performed during this visit and I agree with the Radiologist's read.   Interpretation by Radiologist:  Dg Finger Middle Right  Result Date: 05/29/2018 CLINICAL DATA:  Laceration on coffee mug. EXAM: RIGHT MIDDLE FINGER 2+V COMPARISON:  None. FINDINGS: No acute fracture deformity or dislocation. No destructive bony lesions. Osteopenia. Soft tissue planes are not suspicious. IMPRESSION: Negative. Electronically Signed   By: Elon Alas M.D.   On: 05/29/2018 03:53      ____________________________________________   PROCEDURES  Procedure(s) performed:yes .Marland KitchenLaceration Repair Date/Time: 05/29/2018 5:19 AM Performed by: Rudene Re, MD Authorized by: Rudene Re, MD   Consent:    Consent obtained:  Verbal   Consent given by:  Patient   Risks discussed:  Infection, pain, retained foreign body, poor cosmetic result and poor wound healing Anesthesia (see MAR for exact dosages):    Anesthesia method:  Nerve block   Block location:  Finger   Block needle gauge:  25 G   Block anesthetic:  Lidocaine 1% w/o epi   Block injection procedure:  Anatomic landmarks identified and negative aspiration for blood   Block outcome:  Anesthesia achieved Laceration details:    Location:  Finger   Finger location:  R index finger   Length (cm):  1 Repair type:    Repair type:  Simple Exploration:    Hemostasis achieved with:  Direct pressure   Wound exploration: entire depth of wound probed and visualized     Wound extent: no fascia violation noted, no foreign bodies/material noted, no muscle damage noted, no nerve damage noted, no tendon damage noted and no underlying fracture noted     Contaminated: no   Treatment:    Area cleansed with:  Saline   Amount of cleaning:  Extensive   Irrigation solution:  Sterile saline   Visualized foreign bodies/material removed: no   Skin repair:    Repair method:  Sutures   Suture size:  6-0   Suture material:  Prolene   Suture technique:  Simple interrupted   Number of sutures:  1 Approximation:    Approximation:  Close Post-procedure details:    Dressing:  Sterile dressing   Patient tolerance of procedure:  Tolerated well, no immediate complications   Critical Care performed:  None ____________________________________________   INITIAL IMPRESSION / ASSESSMENT AND PLAN / ED COURSE  68 y.o. female with history as listed below who presents for evaluation of finger laceration.  Laceration repaired per  procedure note above.  Tetanus shot is up-to-date.  No evidence of fracture, muscle/tendon/nerve damage.  Wound care discussed with patient.  Standard return precautions were discussed.      As part of my medical decision making, I reviewed the following data within the [place] notes reviewed and incorporated, Radiograph reviewed , Notes from prior ED visits and [place] Controlled Substance Database    Pertinent labs & imaging results that were available during my care of the patient were reviewed  by me and considered in my medical decision making (see chart for details).    ____________________________________________   FINAL CLINICAL IMPRESSION(S) / ED DIAGNOSES  Final diagnoses:  Laceration of right middle finger without foreign body without damage to nail, initial encounter      NEW MEDICATIONS STARTED DURING THIS VISIT:  ED Discharge Orders    None       Note:  This document was prepared using Dragon voice recognition software and may include unintentional dictation errors.    Alfred Levins, [place], MD 05/29/18 [phone]

## 2018-06-14 ENCOUNTER — Other Ambulatory Visit: Payer: Self-pay | Admitting: Family Medicine

## 2018-06-14 DIAGNOSIS — E038 Other specified hypothyroidism: Secondary | ICD-10-CM

## 2018-06-14 NOTE — Telephone Encounter (Signed)
Refill request for thyroid medication.   Last Physical: 04/06/2018   Lab Results  Component Value Date   TSH 0.87 02/27/2018     Follow up on 08/04/2018

## 2018-06-29 ENCOUNTER — Ambulatory Visit: Payer: Medicare Other | Admitting: Family Medicine

## 2018-07-17 ENCOUNTER — Other Ambulatory Visit: Payer: Self-pay | Admitting: Family Medicine

## 2018-07-17 DIAGNOSIS — K219 Gastro-esophageal reflux disease without esophagitis: Secondary | ICD-10-CM

## 2018-07-17 NOTE — Telephone Encounter (Signed)
Refill request for general medication: Omeprazole 20 mg  Last office visit: 04/06/2018  Follow-ups on file. 08/04/2018

## 2018-07-25 LAB — HM DIABETES EYE EXAM

## 2018-08-04 ENCOUNTER — Other Ambulatory Visit: Payer: Self-pay

## 2018-08-04 ENCOUNTER — Ambulatory Visit (INDEPENDENT_AMBULATORY_CARE_PROVIDER_SITE_OTHER): Payer: Medicare Other | Admitting: Family Medicine

## 2018-08-04 DIAGNOSIS — M5442 Lumbago with sciatica, left side: Secondary | ICD-10-CM

## 2018-08-04 DIAGNOSIS — J4520 Mild intermittent asthma, uncomplicated: Secondary | ICD-10-CM

## 2018-08-04 DIAGNOSIS — E038 Other specified hypothyroidism: Secondary | ICD-10-CM

## 2018-08-04 DIAGNOSIS — I10 Essential (primary) hypertension: Secondary | ICD-10-CM

## 2018-08-04 DIAGNOSIS — F330 Major depressive disorder, recurrent, mild: Secondary | ICD-10-CM

## 2018-08-04 DIAGNOSIS — K219 Gastro-esophageal reflux disease without esophagitis: Secondary | ICD-10-CM

## 2018-08-04 DIAGNOSIS — E1140 Type 2 diabetes mellitus with diabetic neuropathy, unspecified: Secondary | ICD-10-CM | POA: Diagnosis not present

## 2018-08-04 DIAGNOSIS — G4733 Obstructive sleep apnea (adult) (pediatric): Secondary | ICD-10-CM

## 2018-08-04 MED ORDER — TRAMADOL HCL 50 MG PO TABS: 50.0000 mg | ORAL_TABLET | Freq: Three times a day (TID) | ORAL | 0 refills | Status: AC | PRN

## 2018-08-04 MED ORDER — LISINOPRIL 5 MG PO TABS: 5.0000 mg | ORAL_TABLET | Freq: Every day | ORAL | 0 refills | Status: DC

## 2018-08-04 MED ORDER — DULOXETINE HCL 30 MG PO CPEP: 30.0000 mg | ORAL_CAPSULE | Freq: Every day | ORAL | 0 refills | Status: DC

## 2018-08-04 MED ORDER — OMEPRAZOLE 20 MG PO CPDR: 20.0000 mg | DELAYED_RELEASE_CAPSULE | Freq: Every day | ORAL | 0 refills | Status: DC

## 2018-08-04 MED ORDER — LEVOTHYROXINE SODIUM 112 MCG PO TABS: 112.0000 ug | ORAL_TABLET | Freq: Every day | ORAL | 0 refills | Status: DC

## 2018-08-04 MED ORDER — EZETIMIBE 10 MG PO TABS: 10.0000 mg | ORAL_TABLET | Freq: Every day | ORAL | 0 refills | Status: DC

## 2018-08-04 MED ORDER — MELOXICAM 15 MG PO TABS: 15.0000 mg | ORAL_TABLET | Freq: Every day | ORAL | 0 refills | Status: DC

## 2018-08-04 MED ORDER — BACLOFEN 10 MG PO TABS: 10.0000 mg | ORAL_TABLET | Freq: Three times a day (TID) | ORAL | 0 refills | Status: DC

## 2018-08-04 NOTE — Progress Notes (Signed)
Name: Sarah Gordon   MRN: [id number]    DOB: [birth year]   Date:08/04/2018       Progress Note  Subjective  Chief Complaint  Chief Complaint  Patient presents with  . Diabetes  . Asthma    I connected with  [name]  on 08/04/18 at  8:00 AM EDT by a video enabled telemedicine application and verified that I am speaking with the correct person using two identifiers.  I discussed the limitations of evaluation and management by telemedicine and the availability of in person appointments. The patient expressed understanding and agreed to proceed. Staff also discussed with the patient that there may be a patient responsible charge related to this service. Patient Location: home Provider Location: [facility]   HPI  Chronic low back pain with radiculitis left side: she states pain has been more intense and intense, at times it goes down below left knee and at times her knee buckles. She states symptoms started when her husband had surgery and they adjust the bed but since than not getting better, continues symptoms for at least one month . She is allergic to diclofenac but can take Aleve  MDD: she has a history of depression, took Prozac many years ago, she states she could not go to work at the time, she got to the point of not leaving her house. She states over the past month she has been noticing crying spells, feeling down and she is feeling scared that it will be like that again. She also has a family history of depression. phq9 is 7, we will start her on Duloxetine since it is weight neutral and it also help with her pain  Morbid obesity: BMI above 40 with co-morbidities, she is not as active since COVId-19 but will try to move more  Asthma Mild intermittent: she states doing okay at this time, no wheezing or SOB  Hyperlipidemia: taking Zetia, cannot take statins, we will recheck labs when she comes in next visit  HTN: taking lisinopril 5 mg, no side effects of  medication. No chest pain or palpitation    Patient Active Problem List   Diagnosis Date Noted  . DDD (degenerative disc disease), cervical 01/23/2016  . Degenerative joint disease of left acromioclavicular joint 01/23/2016  . Recurrent oral herpes simplex infection 09/01/2015  . Irritable bowel syndrome with diarrhea 08/07/2015  . Statin intolerance 02/28/2015  . Asthma, intermittent 09/10/2014  . Controlled type 2 diabetes with neuropathy ([place]) 09/10/2014  . Fibromyalgia 09/10/2014  . Obstructive sleep apnea 09/10/2014  . Fluttering heart 12/29/2012  . Hyperlipidemia 12/29/2012  . Essential hypertension 12/29/2012  . Morbid obesity ([place]) 12/29/2012  . Arthritis due to gout 12/13/2007  . Acid reflux 10/24/2006  . Adult hypothyroidism 10/24/2006  . Localized osteoarthrosis 10/24/2006    Past Surgical History:  Procedure Laterality Date  . BREAST BIOPSY Left    benign  . COLONOSCOPY     Dr Alveta Heimlich  . COLONOSCOPY WITH PROPOFOL N/A 11/19/2015   Procedure: COLONOSCOPY WITH PROPOFOL;  Surgeon: Christene Lye, MD;  Location: [facility] ENDOSCOPY;  Service: Endoscopy;  Laterality: N/A;  . ESOPHAGOGASTRODUODENOSCOPY N/A 08/20/2014   Procedure: ESOPHAGOGASTRODUODENOSCOPY (EGD);  Surgeon: Christene Lye, MD;  Location: [facility] ENDOSCOPY;  Service: Endoscopy;  Laterality: N/A;  . FOOT SURGERY Left    heel spur  . HERNIA REPAIR     umbilical  . NASAL SINUS SURGERY  1980s  . UMBILICAL HERNIA REPAIR  Family History  Problem Relation Age of Onset  . Hypertension Mother   . Hyperlipidemia Mother   . Diabetes Mother   . Heart disease Mother   . Stroke Mother   . Heart disease Brother 79       CABG x 3   . Heart attack Brother 23  . Lupus Sister   . Arthritis Sister   . Asthma Sister   . Depression Sister   . Diabetes Brother   . Hypertension Brother   . Obesity Brother   . Early death Brother   . Miscarriages / Stillbirths Sister   . Stroke Maternal Grandfather      Social History   Socioeconomic History  . Marital status: Married    Spouse name: Georgena Spurling   . Number of children: 1  . Years of education: Not on file  . Highest education level: Some college, no degree  Occupational History  . Not on file  Social Needs  . Financial resource strain: Somewhat hard  . Food insecurity:    Worry: Never true    Inability: Never true  . Transportation needs:    Medical: No    Non-medical: No  Tobacco Use  . Smoking status: Never Smoker  . Smokeless tobacco: Never Used  Substance and Sexual Activity  . Alcohol use: No    Alcohol/week: 0.0 standard drinks    Comment: RARELY  . Drug use: No  . Sexual activity: Never  Lifestyle  . Physical activity:    Days per week: 5 days    Minutes per session: 20 min  . Stress: Not at all  Relationships  . Social connections:    Talks on phone: More than three times a week    Gets together: More than three times a week    Attends religious service: More than 4 times per year    Active member of club or organization: Yes    Attends meetings of clubs or organizations: More than 4 times per year    Relationship status: Married  . Intimate partner violence:    Fear of current or ex partner: No    Emotionally abused: No    Physically abused: No    Forced sexual activity: No  Other Topics Concern  . Not on file  Social History Narrative  . Not on file     Current Outpatient Medications:  .  LORATADINE ALLERGY RELIEF PO, , Disp: , Rfl:  .  albuterol (PROVENTIL HFA;VENTOLIN HFA) 108 (90 Base) MCG/ACT inhaler, Inhale 2 puffs into the lungs every 6 (six) hours as needed for wheezing or shortness of breath., Disp: 1 Inhaler, Rfl: 2 .  ezetimibe (ZETIA) 10 MG tablet, Take 1 tablet by mouth once daily, Disp: 90 tablet, Rfl: 0 .  levothyroxine (SYNTHROID, LEVOTHROID) 112 MCG tablet, Take 1 tablet by mouth once daily, Disp: 90 tablet, Rfl: 0 .  lisinopril (PRINIVIL,ZESTRIL) 10 MG tablet, Take 0.5 tablets (5 mg  total) by mouth daily., Disp: 90 tablet, Rfl: 0 .  magnesium gluconate (MAGONATE) 500 MG tablet, Take 500 mg by mouth daily., Disp: , Rfl:  .  NON FORMULARY, Take 2 capsules by mouth daily. MicroPlex Food Supplement, Disp: , Rfl:  .  NON FORMULARY, Take 2 capsules by mouth daily. Alpha CRS +, Disp: , Rfl:  .  omeprazole (PRILOSEC) 20 MG capsule, Take 1 capsule by mouth once daily, Disp: 90 capsule, Rfl: 0 .  OVER THE COUNTER MEDICATION, Take 1 capsule by mouth 2 (two)  times daily. Deep blue polyphenol, Disp: , Rfl:  .  OVER THE COUNTER MEDICATION, Take 2 capsules by mouth every morning. XEO Mega, Disp: , Rfl:  .  OVER THE COUNTER MEDICATION, Take 1 capsule by mouth 2 (two) times daily. digestzen, Disp: , Rfl:   Allergies  Allergen Reactions  . Aspir-81 [Aspirin] Other (See Comments)    Coagulation disorder  . Lipitor [Atorvastatin] Other (See Comments)    Joint pain  . Statins Other (See Comments)  . Topamax [Topiramate] Other (See Comments)    Cognitive issues   . Vascepa [Icosapent Ethyl] Swelling and Other (See Comments)  . Voltaren [Diclofenac Sodium] Hives and Other (See Comments)    I personally reviewed active problem list, medication list, allergies, family history, social history with the patient/caregiver today.   ROS  Ten systems reviewed and is negative except as mentioned in HPI   Objective  Virtual encounter, vitals not obtained.  There is no height or weight on file to calculate BMI.  Physical Exam  Awake, alert and oriented  Pain with extension of spine  PHQ2/9: Depression screen [facility] 2/9 08/04/2018 12/21/2017 09/19/2017 07/26/2017 05/06/2016  Decreased Interest 0 0 0 0 0  Down, Depressed, Hopeless 1 0 0 0 0  PHQ - 2 Score 1 0 0 0 0  Altered sleeping 1 1 - - -  Tired, decreased energy 2 1 - - -  Change in appetite 2 1 - - -  Feeling bad or failure about yourself  0 0 - - -  Trouble concentrating 1 0 - - -  Moving slowly or fidgety/restless 0 0 - - -   Suicidal thoughts 0 0 - - -  PHQ-9 Score 7 3 - - -  Difficult doing work/chores Not difficult at all Not difficult at all - - -   PHQ-2/9 Result is positive.    Fall Risk: Fall Risk  08/04/2018 04/06/2018 02/27/2018 12/21/2017 09/19/2017  Falls in the past year? 0 0 0 No No  Number falls in past yr: 0 0 - - -  Injury with Fall? 0 0 - - -     Assessment & Plan   1. Essential hypertension  - lisinopril (ZESTRIL) 5 MG tablet; Take 1 tablet (5 mg total) by mouth daily.  Dispense: 90 tablet; Refill: 0  2. Other specified hypothyroidism  - levothyroxine (SYNTHROID) 112 MCG tablet; Take 1 tablet (112 mcg total) by mouth daily.  Dispense: 90 tablet; Refill: 0  3. Gastroesophageal reflux disease, esophagitis presence not specified  - omeprazole (PRILOSEC) 20 MG capsule; Take 1 capsule (20 mg total) by mouth daily.  Dispense: 90 capsule; Refill: 0  4. Morbid obesity ([place])  Discussed with the patient the risk posed by an increased BMI. Discussed importance of portion control, calorie counting and at least 150 minutes of physical activity weekly. Avoid sweet beverages and drink more water. Eat at least 6 servings of fruit and vegetables daily   5. Controlled type 2 diabetes with neuropathy ([place])  Recheck A1C next visit   6. OSA (obstructive sleep apnea)   7. Mild intermittent asthma without complication   8. Acute bilateral low back pain with left-sided sciatica  - traMADol (ULTRAM) 50 MG tablet; Take 1 tablet (50 mg total) by mouth every 8 (eight) hours as needed for up to 5 days.  Dispense: 30 tablet; Refill: 0 - meloxicam (MOBIC) 15 MG tablet; Take 1 tablet (15 mg total) by mouth daily.  Dispense: 30 tablet; Refill: 0 -  baclofen (LIORESAL) 10 MG tablet; Take 1-2 tablets (10-20 mg total) by mouth 3 (three) times daily.  Dispense: 90 each; Refill: 0  9. MDD (major depressive disorder), recurrent episode, mild (HCC)  - DULoxetine (CYMBALTA) 30 MG capsule; Take 1-2 capsules (30-60 mg  total) by mouth daily.  Dispense: 60 capsule; Refill: 0   I discussed the assessment and treatment plan with the patient. The patient was provided an opportunity to ask questions and all were answered. The patient agreed with the plan and demonstrated an understanding of the instructions.  The patient was advised to call back or seek an in-person evaluation if the symptoms worsen or if the condition fails to improve as anticipated.  I provided 25 minutes of non-face-to-face time during this encounter.

## 2018-08-28 ENCOUNTER — Other Ambulatory Visit: Payer: Self-pay | Admitting: Family Medicine

## 2018-08-28 DIAGNOSIS — M5442 Lumbago with sciatica, left side: Secondary | ICD-10-CM

## 2018-09-04 ENCOUNTER — Other Ambulatory Visit (HOSPITAL_COMMUNITY)
Admission: RE | Admit: 2018-09-04 | Discharge: 2018-09-04 | Disposition: A | Discharge status: Home / Self Care | Payer: Medicare Other | Source: Ambulatory Visit | Attending: Family Medicine | Admitting: Family Medicine

## 2018-09-04 ENCOUNTER — Encounter: Payer: Self-pay | Admitting: Family Medicine

## 2018-09-04 ENCOUNTER — Ambulatory Visit (INDEPENDENT_AMBULATORY_CARE_PROVIDER_SITE_OTHER): Payer: Medicare Other | Admitting: Family Medicine

## 2018-09-04 ENCOUNTER — Other Ambulatory Visit: Payer: Self-pay

## 2018-09-04 VITALS — BP 124/80 | HR 77 | Temp 98.0°F | Resp 16 | Ht 66.0 in | Wt 261.8 lb

## 2018-09-04 DIAGNOSIS — I10 Essential (primary) hypertension: Secondary | ICD-10-CM | POA: Diagnosis not present

## 2018-09-04 DIAGNOSIS — E782 Mixed hyperlipidemia: Secondary | ICD-10-CM | POA: Diagnosis not present

## 2018-09-04 DIAGNOSIS — E038 Other specified hypothyroidism: Secondary | ICD-10-CM | POA: Diagnosis not present

## 2018-09-04 DIAGNOSIS — Z124 Encounter for screening for malignant neoplasm of cervix: Secondary | ICD-10-CM | POA: Diagnosis not present

## 2018-09-04 DIAGNOSIS — F330 Major depressive disorder, recurrent, mild: Secondary | ICD-10-CM

## 2018-09-04 DIAGNOSIS — M170 Bilateral primary osteoarthritis of knee: Secondary | ICD-10-CM | POA: Diagnosis not present

## 2018-09-04 DIAGNOSIS — M5136 Other intervertebral disc degeneration, lumbar region: Secondary | ICD-10-CM

## 2018-09-04 DIAGNOSIS — E1140 Type 2 diabetes mellitus with diabetic neuropathy, unspecified: Secondary | ICD-10-CM | POA: Diagnosis not present

## 2018-09-04 LAB — POCT GLYCOSYLATED HEMOGLOBIN (HGB A1C): Hemoglobin A1C: 5.5 % (ref 4.0–5.6)

## 2018-09-04 MED ORDER — MELOXICAM 15 MG PO TABS: 15.0000 mg | ORAL_TABLET | Freq: Every day | ORAL | 0 refills | Status: DC

## 2018-09-04 MED ORDER — TRAMADOL HCL 50 MG PO TABS: 50.0000 mg | ORAL_TABLET | Freq: Every day | ORAL | 0 refills | Status: DC | PRN

## 2018-09-04 MED ORDER — BACLOFEN 10 MG PO TABS: 10.0000 mg | ORAL_TABLET | Freq: Three times a day (TID) | ORAL | 2 refills | Status: DC

## 2018-09-04 MED ORDER — DULOXETINE HCL 60 MG PO CPEP: 60.0000 mg | ORAL_CAPSULE | Freq: Every day | ORAL | 0 refills | Status: DC

## 2018-09-04 NOTE — Progress Notes (Signed)
Name: Sarah Gordon   MRN: [id number]    DOB: Jul 29, 1950   Date:09/04/2018       Progress Note  Subjective  Chief Complaint  Chief Complaint  Patient presents with  . Hypertension  . Diabetes  . Depression  . Gynecologic Exam  . Hot Flashes    She has been having hot flashes x 1 month since taking new medication.    HPI  Depression: long history of depression, felt worse prior to last visit and we resume medication, she is on Duloxetine 60 mg  phq 9 still positive and we will monitor. She has noticed hot flashes since started the medication, discussed adding clonidine, she is willing to continue medications for now   DMII: hgbA1C is down to 5.5%, she denies polyphagia, polydipsia or polyuria.   Left lower back pain: she is taking meloxicam , baclofen TID and tramadol prn only when in severe pain, explained that tramadol is a controlled medication and must last 3 months.   Cervical cancer screen: no recent pap's she has medicare, denies post-menopausal bleeding   Dyslipidemia: she is now taking Zetia and we will recheck labs   Patient Active Problem List   Diagnosis Date Noted  . MDD (major depressive disorder), recurrent episode, mild ([place]) 09/04/2018  . DDD (degenerative disc disease), cervical 01/23/2016  . Degenerative joint disease of left acromioclavicular joint 01/23/2016  . Recurrent oral herpes simplex infection 09/01/2015  . Irritable bowel syndrome with diarrhea 08/07/2015  . Statin intolerance 02/28/2015  . Asthma, intermittent 09/10/2014  . Controlled type 2 diabetes with neuropathy ([place]) 09/10/2014  . Fibromyalgia 09/10/2014  . Obstructive sleep apnea 09/10/2014  . Fluttering heart 12/29/2012  . Hyperlipidemia 12/29/2012  . Essential hypertension 12/29/2012  . Morbid obesity ([place]) 12/29/2012  . Arthritis due to gout 12/13/2007  . Acid reflux 10/24/2006  . Adult hypothyroidism 10/24/2006  . Localized osteoarthrosis 10/24/2006    Past Surgical History:   Procedure Laterality Date  . BREAST BIOPSY Left    benign  . COLONOSCOPY     Dr Alveta Heimlich  . COLONOSCOPY WITH PROPOFOL N/A 11/19/2015   Procedure: COLONOSCOPY WITH PROPOFOL;  Surgeon: Christene Lye, MD;  Location: [facility] ENDOSCOPY;  Service: Endoscopy;  Laterality: N/A;  . ESOPHAGOGASTRODUODENOSCOPY N/A 08/20/2014   Procedure: ESOPHAGOGASTRODUODENOSCOPY (EGD);  Surgeon: Christene Lye, MD;  Location: [facility] ENDOSCOPY;  Service: Endoscopy;  Laterality: N/A;  . FOOT SURGERY Left    heel spur  . HERNIA REPAIR     umbilical  . NASAL SINUS SURGERY  1980s  . UMBILICAL HERNIA REPAIR      Family History  Problem Relation Age of Onset  . Hypertension Mother   . Hyperlipidemia Mother   . Diabetes Mother   . Heart disease Mother   . Stroke Mother   . Heart disease Brother 72       CABG x 3   . Heart attack Brother 76  . Lupus Sister   . Arthritis Sister   . Asthma Sister   . Depression Sister   . Diabetes Brother   . Hypertension Brother   . Obesity Brother   . Early death Brother   . Miscarriages / Stillbirths Sister   . Stroke Maternal Grandfather     Social History   Socioeconomic History  . Marital status: Married    Spouse name: Georgena Spurling   . Number of children: 1  . Years of education: Not on file  . Highest education level: Some college, no degree  Occupational History  . Not on file  Social Needs  . Financial resource strain: Somewhat hard  . Food insecurity    Worry: Never true    Inability: Never true  . Transportation needs    Medical: No    Non-medical: No  Tobacco Use  . Smoking status: Never Smoker  . Smokeless tobacco: Never Used  Substance and Sexual Activity  . Alcohol use: No    Alcohol/week: 0.0 standard drinks    Comment: RARELY  . Drug use: No  . Sexual activity: Never  Lifestyle  . Physical activity    Days per week: 5 days    Minutes per session: 20 min  . Stress: Not at all  Relationships  . Social connections    Talks on phone:  More than three times a week    Gets together: More than three times a week    Attends religious service: More than 4 times per year    Active member of club or organization: Yes    Attends meetings of clubs or organizations: More than 4 times per year    Relationship status: Married  . Intimate partner violence    Fear of current or ex partner: No    Emotionally abused: No    Physically abused: No    Forced sexual activity: No  Other Topics Concern  . Not on file  Social History Narrative  . Not on file     Current Outpatient Medications:  .  albuterol (PROVENTIL HFA;VENTOLIN HFA) 108 (90 Base) MCG/ACT inhaler, Inhale 2 puffs into the lungs every 6 (six) hours as needed for wheezing or shortness of breath., Disp: 1 Inhaler, Rfl: 2 .  baclofen (LIORESAL) 10 MG tablet, Take 1-2 tablets (10-20 mg total) by mouth 3 (three) times daily., Disp: 90 each, Rfl: 2 .  DULoxetine (CYMBALTA) 60 MG capsule, Take 1 capsule (60 mg total) by mouth daily., Disp: 90 capsule, Rfl: 0 .  ezetimibe (ZETIA) 10 MG tablet, Take 1 tablet (10 mg total) by mouth daily., Disp: 90 tablet, Rfl: 0 .  levothyroxine (SYNTHROID) 112 MCG tablet, Take 1 tablet (112 mcg total) by mouth daily., Disp: 90 tablet, Rfl: 0 .  lisinopril (ZESTRIL) 5 MG tablet, Take 1 tablet (5 mg total) by mouth daily., Disp: 90 tablet, Rfl: 0 .  LORATADINE ALLERGY RELIEF PO, , Disp: , Rfl:  .  magnesium gluconate (MAGONATE) 500 MG tablet, Take 500 mg by mouth daily., Disp: , Rfl:  .  meloxicam (MOBIC) 15 MG tablet, Take 1 tablet (15 mg total) by mouth daily., Disp: 90 tablet, Rfl: 0 .  NON FORMULARY, Take 2 capsules by mouth daily. MicroPlex Food Supplement, Disp: , Rfl:  .  NON FORMULARY, Take 2 capsules by mouth daily. Alpha CRS +, Disp: , Rfl:  .  omeprazole (PRILOSEC) 20 MG capsule, Take 1 capsule (20 mg total) by mouth daily., Disp: 90 capsule, Rfl: 0 .  OVER THE COUNTER MEDICATION, Take 1 capsule by mouth 2 (two) times daily. Deep blue  polyphenol, Disp: , Rfl:  .  OVER THE COUNTER MEDICATION, Take 2 capsules by mouth every morning. XEO Mega, Disp: , Rfl:  .  OVER THE COUNTER MEDICATION, Take 1 capsule by mouth 2 (two) times daily. digestzen, Disp: , Rfl:  .  traMADol (ULTRAM) 50 MG tablet, Take 1 tablet (50 mg total) by mouth daily as needed. To last 90 days, Disp: 30 tablet, Rfl: 0  Allergies  Allergen Reactions  . Aspir-81 [Aspirin]  Other (See Comments)    Coagulation disorder  . Lipitor [Atorvastatin] Other (See Comments)    Joint pain  . Statins Other (See Comments)  . Topamax [Topiramate] Other (See Comments)    Cognitive issues   . Vascepa [Icosapent Ethyl] Swelling and Other (See Comments)  . Voltaren [Diclofenac Sodium] Hives and Other (See Comments)    I personally reviewed active problem list, medication list, allergies, family history, social history with the patient/caregiver today.   ROS   Constitutional: Negative for fever or weight change.  Respiratory: Negative for cough and shortness of breath.   Cardiovascular: Negative for chest pain or palpitations.  Gastrointestinal: Negative for abdominal pain, no bowel changes.  Musculoskeletal: Negative for gait problem or joint swelling.  Skin: Negative for rash.  Neurological: Negative for dizziness or headache.  No other specific complaints in a complete review of systems (except as listed in HPI above).  Objective  Vitals:   09/04/18 0821  BP: 124/80  Pulse: 77  Resp: 16  Temp: 98 F (36.7 C)  TempSrc: Oral  SpO2: 99%  Weight: 261 lb 12.8 oz (118.8 kg)  Height: 5\' 6"  (1.676 m)    Body mass index is 42.26 kg/m.  Physical Exam  Constitutional: Patient appears well-developed and well-nourished. Obese  No distress.  HEENT: head atraumatic, normocephalic, pupils equal and reactive to light, neck supple, throat within normal limits Cardiovascular: Normal rate, regular rhythm and normal heart sounds.  No murmur heard. No BLE  edema. Pulmonary/Chest: Effort normal and breath sounds normal. No respiratory distress. Abdominal: Soft.  There is no tenderness. GYN: very small polyp inside the cervix, no pain, vaginal atrophy  Psychiatric: Patient has a normal mood and affect. behavior is normal. Judgment and thought content normal.  Recent Results (from the past 2160 hour(s))  POCT HgB A1C     Status: Normal   Collection Time: 09/04/18  8:29 AM  Result Value Ref Range   Hemoglobin A1C 5.5 4.0 - 5.6 %   HbA1c POC (<> result, manual entry)     HbA1c, POC (prediabetic range)     HbA1c, POC (controlled diabetic range)      Diabetic Foot Exam: Diabetic Foot Exam - Simple   Simple Foot Form Visual Inspection See comments: Yes Sensation Testing Intact to touch and monofilament testing bilaterally: Yes Pulse Check Posterior Tibialis and Dorsalis pulse intact bilaterally: Yes Comments Corn and callus formation      PHQ2/9: Depression screen [facility] 2/9 09/04/2018 08/04/2018 12/21/2017 09/19/2017 07/26/2017  Decreased Interest 0 0 0 0 0  Down, Depressed, Hopeless 1 1 0 0 0  PHQ - 2 Score 1 1 0 0 0  Altered sleeping 2 1 1  - -  Tired, decreased energy 2 2 1  - -  Change in appetite 1 2 1  - -  Feeling bad or failure about yourself  0 0 0 - -  Trouble concentrating 0 1 0 - -  Moving slowly or fidgety/restless 0 0 0 - -  Suicidal thoughts 0 0 0 - -  PHQ-9 Score 6 7 3  - -  Difficult doing work/chores Somewhat difficult Not difficult at all Not difficult at all - -    phq 9 is positive   Fall Risk: Fall Risk  08/04/2018 04/06/2018 02/27/2018 12/21/2017 09/19/2017  Falls in the past year? 0 0 0 No No  Number falls in past yr: 0 0 - - -  Injury with Fall? 0 0 - - -     Assessment &  Plan   1. Controlled type 2 diabetes with neuropathy (HCC)  - POCT HgB A1C  2. Cervical cancer screening  - Cytology - PAP  3. MDD (major depressive disorder), recurrent episode, mild (HCC)  - DULoxetine (CYMBALTA) 60 MG capsule; Take  1 capsule (60 mg total) by mouth daily.  Dispense: 90 capsule; Refill: 0  4. Essential hypertension  -comp panel   5. Other specified hypothyroidism  -TSH  6. Primary osteoarthritis of both knees  - meloxicam (MOBIC) 15 MG tablet; Take 1 tablet (15 mg total) by mouth daily.  Dispense: 90 tablet; Refill: 0  7. DDD (degenerative disc disease), lumbar  - baclofen (LIORESAL) 10 MG tablet; Take 1-2 tablets (10-20 mg total) by mouth 3 (three) times daily.  Dispense: 90 each; Refill: 2 - meloxicam (MOBIC) 15 MG tablet; Take 1 tablet (15 mg total) by mouth daily.  Dispense: 90 tablet; Refill: 0 - traMADol (ULTRAM) 50 MG tablet; Take 1 tablet (50 mg total) by mouth daily as needed. To last 90 days  Dispense: 30 tablet; Refill: 0  8. Mixed hyperlipidemia  - Lipid panel

## 2018-09-05 ENCOUNTER — Other Ambulatory Visit: Payer: Self-pay | Admitting: Family Medicine

## 2018-09-05 DIAGNOSIS — E038 Other specified hypothyroidism: Secondary | ICD-10-CM

## 2018-09-05 LAB — CYTOLOGY - PAP: Diagnosis: NEGATIVE

## 2018-09-05 LAB — COMPLETE METABOLIC PANEL WITH GFR
AG Ratio: 1.7 (calc) (ref 1.0–2.5)
ALT: 19 U/L (ref 6–29)
AST: 22 U/L (ref 10–35)
Albumin: 4 g/dL (ref 3.6–5.1)
Alkaline phosphatase (APISO): 77 U/L (ref 37–153)
BUN: 20 mg/dL (ref 7–25)
CO2: 24 mmol/L (ref 20–32)
Calcium: 9 mg/dL (ref 8.6–10.4)
Chloride: 106 mmol/L (ref 98–110)
Creat: 0.97 mg/dL (ref 0.50–0.99)
GFR, Est African American: 71 mL/min/{1.73_m2} (ref >=60)
GFR, Est Non African American: 61 mL/min/{1.73_m2} (ref >=60)
Globulin: 2.3 g/dL (calc) (ref 1.9–3.7)
Glucose, Bld: 122 mg/dL — ABNORMAL HIGH (ref 65–99)
Potassium: 4.4 mmol/L (ref 3.5–5.3)
Sodium: 140 mmol/L (ref 135–146)
Total Bilirubin: 0.4 mg/dL (ref 0.2–1.2)
Total Protein: 6.3 g/dL (ref 6.1–8.1)

## 2018-09-05 LAB — TSH: TSH: 0.14 mIU/L — ABNORMAL LOW (ref 0.40–4.50)

## 2018-09-05 LAB — LIPID PANEL
Cholesterol: 230 mg/dL — ABNORMAL HIGH (ref <200)
HDL: 43 mg/dL — ABNORMAL LOW (ref >=50)
LDL Cholesterol (Calc): 157 mg/dL (calc) — ABNORMAL HIGH
Non-HDL Cholesterol (Calc): 187 mg/dL (calc) — ABNORMAL HIGH (ref <130)
Total CHOL/HDL Ratio: 5.3 (calc) — ABNORMAL HIGH (ref <5.0)
Triglycerides: 165 mg/dL — ABNORMAL HIGH (ref <150)

## 2018-09-05 MED ORDER — LEVOTHYROXINE SODIUM 100 MCG PO TABS: 100.0000 ug | ORAL_TABLET | Freq: Every day | ORAL | 0 refills | Status: DC

## 2018-11-09 DIAGNOSIS — M5136 Other intervertebral disc degeneration, lumbar region: Secondary | ICD-10-CM | POA: Diagnosis not present

## 2018-11-24 ENCOUNTER — Other Ambulatory Visit: Payer: Self-pay

## 2018-11-24 ENCOUNTER — Encounter: Payer: Self-pay | Admitting: Family Medicine

## 2018-11-24 ENCOUNTER — Ambulatory Visit (INDEPENDENT_AMBULATORY_CARE_PROVIDER_SITE_OTHER): Payer: Medicare Other | Admitting: Family Medicine

## 2018-11-24 VITALS — BP 150/100 | HR 80 | Temp 97.5°F | Resp 16 | Ht 66.0 in | Wt 264.2 lb

## 2018-11-24 DIAGNOSIS — E038 Other specified hypothyroidism: Secondary | ICD-10-CM | POA: Diagnosis not present

## 2018-11-24 DIAGNOSIS — I10 Essential (primary) hypertension: Secondary | ICD-10-CM

## 2018-11-24 DIAGNOSIS — R1013 Epigastric pain: Secondary | ICD-10-CM | POA: Diagnosis not present

## 2018-11-24 MED ORDER — LISINOPRIL 20 MG PO TABS: 20.0000 mg | ORAL_TABLET | Freq: Every day | ORAL | 0 refills | Status: DC

## 2018-11-24 NOTE — Progress Notes (Signed)
Name: Sarah Gordon   MRN: [id number]    DOB: January 23, 1951   Date:11/24/2018       Progress Note  Subjective  Chief Complaint  Chief Complaint  Patient presents with  . Irritable Bowel Syndrome  . Allergies    thinks that she maybe developing some food allergies. She has numbness on her tongue when she eats certain foods.    HPI  Abdominal cramping: she has a history of IBS, she went to [place] about one month ago, two weeks ago developed typical symptoms described as cramping , urgency, bloating and diarrhea, usually resolves in about 3-4 days however still having cramping when she eats since the episode and that is different for her. No fever or chills, she is afraid to eat because it causes epigastric pain described as sharp pain followed by cramping  that radiates down . She has a history of GERD and is taking omeprazole. She denies nausea or vomiting, no blood in stools. She has a history of h. Pylori in the past. She is on PPI  Hypothyroidism: TSH was suppressed two months ago, she states she is compliant with medication 100 mcg daily, denies palpitation, diarrhea with IBS a couple of weeks ago but not currently  HTN: she has noticed headaches in am's and bp at home has been elevated, it went up to 170/100 recently, usually in the 140's/90's , no chest pain or palpitation  MDD: she is okay with medication, trying to find her triggers, excited that church will open back up soon   Patient Active Problem List   Diagnosis Date Noted  . MDD (major depressive disorder), recurrent episode, mild ([place]) 09/04/2018  . DDD (degenerative disc disease), cervical 01/23/2016  . Degenerative joint disease of left acromioclavicular joint 01/23/2016  . Recurrent oral herpes simplex infection 09/01/2015  . Irritable bowel syndrome with diarrhea 08/07/2015  . Statin intolerance 02/28/2015  . Asthma, intermittent 09/10/2014  . Controlled type 2 diabetes with neuropathy ([place]) 09/10/2014  . Fibromyalgia  09/10/2014  . Obstructive sleep apnea 09/10/2014  . Fluttering heart 12/29/2012  . Hyperlipidemia 12/29/2012  . Essential hypertension 12/29/2012  . Morbid obesity ([place]) 12/29/2012  . Arthritis due to gout 12/13/2007  . Acid reflux 10/24/2006  . Adult hypothyroidism 10/24/2006  . Localized osteoarthrosis 10/24/2006    Past Surgical History:  Procedure Laterality Date  . BREAST BIOPSY Left    benign  . COLONOSCOPY     Dr Alveta Heimlich  . COLONOSCOPY WITH PROPOFOL N/A 11/19/2015   Procedure: COLONOSCOPY WITH PROPOFOL;  Surgeon: Christene Lye, MD;  Location: [facility] ENDOSCOPY;  Service: Endoscopy;  Laterality: N/A;  . ESOPHAGOGASTRODUODENOSCOPY N/A 08/20/2014   Procedure: ESOPHAGOGASTRODUODENOSCOPY (EGD);  Surgeon: Christene Lye, MD;  Location: [facility] ENDOSCOPY;  Service: Endoscopy;  Laterality: N/A;  . FOOT SURGERY Left    heel spur  . HERNIA REPAIR     umbilical  . NASAL SINUS SURGERY  1980s  . UMBILICAL HERNIA REPAIR      Family History  Problem Relation Age of Onset  . Hypertension Mother   . Hyperlipidemia Mother   . Diabetes Mother   . Heart disease Mother   . Stroke Mother   . Heart disease Brother 4       CABG x 3   . Heart attack Brother 86  . Lupus Sister   . Arthritis Sister   . Asthma Sister   . Depression Sister   . Diabetes Brother   . Hypertension Brother   .  Obesity Brother   . Early death Brother   . Miscarriages / Stillbirths Sister   . Stroke Maternal Grandfather     Social History   Socioeconomic History  . Marital status: Married    Spouse name: Georgena Spurling   . Number of children: 1  . Years of education: Not on file  . Highest education level: Some college, no degree  Occupational History  . Not on file  Social Needs  . Financial resource strain: Somewhat hard  . Food insecurity    Worry: Never true    Inability: Never true  . Transportation needs    Medical: No    Non-medical: No  Tobacco Use  . Smoking status: Never Smoker  .  Smokeless tobacco: Never Used  Substance and Sexual Activity  . Alcohol use: No    Alcohol/week: 0.0 standard drinks    Comment: RARELY  . Drug use: No  . Sexual activity: Never  Lifestyle  . Physical activity    Days per week: 5 days    Minutes per session: 20 min  . Stress: Not at all  Relationships  . Social connections    Talks on phone: More than three times a week    Gets together: More than three times a week    Attends religious service: More than 4 times per year    Active member of club or organization: Yes    Attends meetings of clubs or organizations: More than 4 times per year    Relationship status: Married  . Intimate partner violence    Fear of current or ex partner: No    Emotionally abused: No    Physically abused: No    Forced sexual activity: No  Other Topics Concern  . Not on file  Social History Narrative  . Not on file     Current Outpatient Medications:  .  albuterol (PROVENTIL HFA;VENTOLIN HFA) 108 (90 Base) MCG/ACT inhaler, Inhale 2 puffs into the lungs every 6 (six) hours as needed for wheezing or shortness of breath., Disp: 1 Inhaler, Rfl: 2 .  baclofen (LIORESAL) 10 MG tablet, Take 1-2 tablets (10-20 mg total) by mouth 3 (three) times daily., Disp: 90 each, Rfl: 2 .  DULoxetine (CYMBALTA) 60 MG capsule, Take 1 capsule (60 mg total) by mouth daily., Disp: 90 capsule, Rfl: 0 .  EUTHYROX 100 MCG tablet, TAKE 1 TABLET BY MOUTH ONCE DAILY, Disp: 90 tablet, Rfl: 0 .  ezetimibe (ZETIA) 10 MG tablet, Take 1 tablet (10 mg total) by mouth daily., Disp: 90 tablet, Rfl: 0 .  lisinopril (ZESTRIL) 20 MG tablet, Take 1 tablet (20 mg total) by mouth daily., Disp: 90 tablet, Rfl: 0 .  LORATADINE ALLERGY RELIEF PO, , Disp: , Rfl:  .  magnesium gluconate (MAGONATE) 500 MG tablet, Take 500 mg by mouth daily., Disp: , Rfl:  .  meloxicam (MOBIC) 15 MG tablet, Take 1 tablet (15 mg total) by mouth daily., Disp: 90 tablet, Rfl: 0 .  NON FORMULARY, Take 2 capsules by  mouth daily. MicroPlex Food Supplement, Disp: , Rfl:  .  NON FORMULARY, Take 2 capsules by mouth daily. Alpha CRS +, Disp: , Rfl:  .  omeprazole (PRILOSEC) 20 MG capsule, Take 1 capsule (20 mg total) by mouth daily., Disp: 90 capsule, Rfl: 0 .  OVER THE COUNTER MEDICATION, Take 1 capsule by mouth 2 (two) times daily. Deep blue polyphenol, Disp: , Rfl:  .  OVER THE COUNTER MEDICATION, Take 2 capsules by mouth  every morning. XEO Mega, Disp: , Rfl:  .  OVER THE COUNTER MEDICATION, Take 1 capsule by mouth 2 (two) times daily. digestzen, Disp: , Rfl:  .  traMADol (ULTRAM) 50 MG tablet, Take 1 tablet (50 mg total) by mouth daily as needed. To last 90 days, Disp: 30 tablet, Rfl: 0  Allergies  Allergen Reactions  . Aspir-81 [Aspirin] Other (See Comments)    Coagulation disorder  . Lipitor [Atorvastatin] Other (See Comments)    Joint pain  . Statins Other (See Comments)  . Topamax [Topiramate] Other (See Comments)    Cognitive issues   . Vascepa [Icosapent Ethyl] Swelling and Other (See Comments)  . Voltaren [Diclofenac Sodium] Hives and Other (See Comments)    I personally reviewed active problem list, medication list, allergies, family history, social history with the patient/caregiver today.   ROS  Constitutional: Negative for fever or weight change.  Respiratory: Negative for cough and shortness of breath.   Cardiovascular: Negative for chest pain or palpitations.  Gastrointestinal: Negative for abdominal pain, no bowel changes.  Musculoskeletal: Negative for gait problem or joint swelling.  Skin: Negative for rash.  Neurological: Negative for dizziness, positive  headache.  No other specific complaints in a complete review of systems (except as listed in HPI above).  Objective  Vitals:   11/24/18 0857 11/24/18 0858  BP: (!) 140/100 (!) 150/100  Pulse: 80   Resp: 16   Temp: (!) 97.5 F (36.4 C)   TempSrc: Temporal   SpO2: 98%   Weight: 264 lb 3.2 oz (119.8 kg)   Height: 5'  6" (1.676 m)     Body mass index is 42.64 kg/m.  Physical Exam  Constitutional: Patient appears well-developed and well-nourished. Obese No distress.  HEENT: head atraumatic, normocephalic, pupils equal and reactive to light Cardiovascular: Normal rate, regular rhythm and normal heart sounds.  No murmur heard. No BLE edema. Pulmonary/Chest: Effort normal and breath sounds normal. No respiratory distress. Abdominal: Soft.  There is no tenderness. Psychiatric: Patient has a normal mood and affect. behavior is normal. Judgment and thought content normal.  Recent Results (from the past 2160 hour(s))  Cytology - PAP     Status: None   Collection Time: 09/04/18 12:00 AM  Result Value Ref Range   Adequacy      Satisfactory for evaluation  endocervical/transformation zone component PRESENT.   Diagnosis      NEGATIVE FOR INTRAEPITHELIAL LESIONS OR MALIGNANCY.   Material Submitted CervicoVaginal Pap [ThinPrep Imaged]    CYTOLOGY - PAP PAP RESULT   POCT HgB A1C     Status: Normal   Collection Time: 09/04/18  8:29 AM  Result Value Ref Range   Hemoglobin A1C 5.5 4.0 - 5.6 %   HbA1c POC (<> result, manual entry)     HbA1c, POC (prediabetic range)     HbA1c, POC (controlled diabetic range)    COMPLETE METABOLIC PANEL WITH GFR     Status: Abnormal   Collection Time: 09/04/18  9:18 AM  Result Value Ref Range   Glucose, Bld 122 (H) 65 - 99 mg/dL    Comment: .            Fasting reference interval . For someone without known diabetes, a glucose value between 100 and 125 mg/dL is consistent with prediabetes and should be confirmed with a follow-up test. .    BUN 20 7 - 25 mg/dL   Creat 0.97 0.50 - 0.99 mg/dL    Comment: For patients >49  years of age, the reference limit for Creatinine is approximately 13% higher for people identified as African-American. .    GFR, Est Non African American 61 > OR = 60 mL/min/1.5m2   GFR, Est African American 71 > OR = 60 mL/min/1.74m2    BUN/Creatinine Ratio NOT APPLICABLE 6 - 22 (calc)   Sodium 140 135 - 146 mmol/L   Potassium 4.4 3.5 - 5.3 mmol/L   Chloride 106 98 - 110 mmol/L   CO2 24 20 - 32 mmol/L   Calcium 9.0 8.6 - 10.4 mg/dL   Total Protein 6.3 6.1 - 8.1 g/dL   Albumin 4.0 3.6 - 5.1 g/dL   Globulin 2.3 1.9 - 3.7 g/dL (calc)   AG Ratio 1.7 1.0 - 2.5 (calc)   Total Bilirubin 0.4 0.2 - 1.2 mg/dL   Alkaline phosphatase (APISO) 77 37 - 153 U/L   AST 22 10 - 35 U/L   ALT 19 6 - 29 U/L  Lipid panel     Status: Abnormal   Collection Time: 09/04/18  9:18 AM  Result Value Ref Range   Cholesterol 230 (H) <200 mg/dL   HDL 43 (L) > OR = 50 mg/dL   Triglycerides 165 (H) <150 mg/dL   LDL Cholesterol (Calc) 157 (H) mg/dL (calc)    Comment: Reference range: <100 . Desirable range <100 mg/dL for primary prevention;   <70 mg/dL for patients with CHD or diabetic patients  with > or = 2 CHD risk factors. Marland Kitchen LDL-C is now calculated using the Martin-Hopkins  calculation, which is a validated novel method providing  better accuracy than the Friedewald equation in the  estimation of LDL-C.  Cresenciano Genre et al. Annamaria Helling. [id number]): 2061-2068  (http://education.[facility].com/faq/FAQ164)    Total CHOL/HDL Ratio 5.3 (H) <5.0 (calc)   Non-HDL Cholesterol (Calc) 187 (H) <130 mg/dL (calc)    Comment: For patients with diabetes plus 1 major ASCVD risk  factor, treating to a non-HDL-C goal of <100 mg/dL  (LDL-C of <70 mg/dL) is considered a therapeutic  option.   TSH     Status: Abnormal   Collection Time: 09/04/18  9:18 AM  Result Value Ref Range   TSH 0.14 (L) 0.40 - 4.50 mIU/L      PHQ2/9: Depression screen [facility] 2/9 11/24/2018 09/04/2018 08/04/2018 12/21/2017 09/19/2017  Decreased Interest 0 0 0 0 0  Down, Depressed, Hopeless 1 1 1  0 0  PHQ - 2 Score 1 1 1  0 0  Altered sleeping 3 2 1 1  -  Tired, decreased energy 3 2 2 1  -  Change in appetite 3 1 2 1  -  Feeling bad or failure about yourself  0 0 0 0 -  Trouble  concentrating 0 0 1 0 -  Moving slowly or fidgety/restless 0 0 0 0 -  Suicidal thoughts 0 0 0 0 -  PHQ-9 Score 10 6 7 3  -  Difficult doing work/chores Not difficult at all Somewhat difficult Not difficult at all Not difficult at all -    phq 9 is positive   Fall Risk: Fall Risk  11/24/2018 11/24/2018 08/04/2018 04/06/2018 02/27/2018  Falls in the past year? 0 0 0 0 0  Number falls in past yr: 0 0 0 0 -  Injury with Fall? 0 0 0 0 -    Functional Status Survey: Is the patient deaf or have difficulty hearing?: No Does the patient have difficulty seeing, even when wearing glasses/contacts?: No Does the patient have difficulty concentrating, remembering, or making  decisions?: No Does the patient have difficulty walking or climbing stairs?: Yes Does the patient have difficulty dressing or bathing?: No Does the patient have difficulty doing errands alone such as visiting a doctor's office or shopping?: No    Assessment & Plan  1. Other specified hypothyroidism  Recheck TSH  2. Essential hypertension  We will adjust dose of lisinopril from 5 mg to 20mg  - lisinopril (ZESTRIL) 20 MG tablet; Take 1 tablet (20 mg total) by mouth daily.  Dispense: 90 tablet; Refill: 0  3. Dyspepsia  - H. pylori breath test

## 2018-11-25 ENCOUNTER — Encounter: Payer: Self-pay | Admitting: Family Medicine

## 2018-11-25 LAB — TSH: TSH: 1.23 mIU/L (ref 0.40–4.50)

## 2018-11-28 ENCOUNTER — Other Ambulatory Visit: Payer: Self-pay | Admitting: Family Medicine

## 2018-11-28 DIAGNOSIS — R1013 Epigastric pain: Secondary | ICD-10-CM | POA: Diagnosis not present

## 2018-11-29 ENCOUNTER — Other Ambulatory Visit: Payer: Self-pay | Admitting: Family Medicine

## 2018-11-29 ENCOUNTER — Encounter: Payer: Self-pay | Admitting: Family Medicine

## 2018-11-29 DIAGNOSIS — R1013 Epigastric pain: Secondary | ICD-10-CM

## 2018-11-29 LAB — H. PYLORI BREATH TEST: H. pylori Breath Test: NOT DETECTED

## 2018-12-04 DIAGNOSIS — M5116 Intervertebral disc disorders with radiculopathy, lumbar region: Secondary | ICD-10-CM | POA: Diagnosis not present

## 2018-12-04 DIAGNOSIS — M5136 Other intervertebral disc degeneration, lumbar region: Secondary | ICD-10-CM | POA: Diagnosis not present

## 2018-12-04 DIAGNOSIS — M48061 Spinal stenosis, lumbar region without neurogenic claudication: Secondary | ICD-10-CM | POA: Diagnosis not present

## 2018-12-04 DIAGNOSIS — M5117 Intervertebral disc disorders with radiculopathy, lumbosacral region: Secondary | ICD-10-CM | POA: Diagnosis not present

## 2018-12-04 DIAGNOSIS — M79606 Pain in leg, unspecified: Secondary | ICD-10-CM | POA: Diagnosis not present

## 2018-12-05 ENCOUNTER — Other Ambulatory Visit: Payer: Self-pay | Admitting: Family Medicine

## 2018-12-05 DIAGNOSIS — M5136 Other intervertebral disc degeneration, lumbar region: Secondary | ICD-10-CM | POA: Diagnosis not present

## 2018-12-05 DIAGNOSIS — R1084 Generalized abdominal pain: Secondary | ICD-10-CM

## 2018-12-05 DIAGNOSIS — R1010 Upper abdominal pain, unspecified: Secondary | ICD-10-CM

## 2018-12-05 DIAGNOSIS — R197 Diarrhea, unspecified: Secondary | ICD-10-CM

## 2018-12-06 ENCOUNTER — Other Ambulatory Visit: Payer: Self-pay

## 2018-12-06 ENCOUNTER — Encounter: Payer: Self-pay | Admitting: Family Medicine

## 2018-12-06 ENCOUNTER — Ambulatory Visit (INDEPENDENT_AMBULATORY_CARE_PROVIDER_SITE_OTHER): Payer: Medicare Other | Admitting: Family Medicine

## 2018-12-06 VITALS — BP 150/90 | HR 75 | Temp 97.7°F | Resp 16 | Ht 66.0 in | Wt 261.1 lb

## 2018-12-06 DIAGNOSIS — F330 Major depressive disorder, recurrent, mild: Secondary | ICD-10-CM

## 2018-12-06 DIAGNOSIS — R1010 Upper abdominal pain, unspecified: Secondary | ICD-10-CM

## 2018-12-06 DIAGNOSIS — M5136 Other intervertebral disc degeneration, lumbar region: Secondary | ICD-10-CM

## 2018-12-06 DIAGNOSIS — E038 Other specified hypothyroidism: Secondary | ICD-10-CM

## 2018-12-06 DIAGNOSIS — Z23 Encounter for immunization: Secondary | ICD-10-CM | POA: Diagnosis not present

## 2018-12-06 DIAGNOSIS — R140 Abdominal distension (gaseous): Secondary | ICD-10-CM

## 2018-12-06 DIAGNOSIS — E1140 Type 2 diabetes mellitus with diabetic neuropathy, unspecified: Secondary | ICD-10-CM | POA: Diagnosis not present

## 2018-12-06 DIAGNOSIS — R110 Nausea: Secondary | ICD-10-CM

## 2018-12-06 DIAGNOSIS — M170 Bilateral primary osteoarthritis of knee: Secondary | ICD-10-CM

## 2018-12-06 DIAGNOSIS — I4902 Ventricular flutter: Secondary | ICD-10-CM

## 2018-12-06 LAB — POCT GLYCOSYLATED HEMOGLOBIN (HGB A1C): Hemoglobin A1C: 5.7 % — AB (ref 4.0–5.6)

## 2018-12-06 MED ORDER — BACLOFEN 10 MG PO TABS: 10.0000 mg | ORAL_TABLET | Freq: Two times a day (BID) | ORAL | 0 refills | Status: DC

## 2018-12-06 MED ORDER — MELOXICAM 15 MG PO TABS: 15.0000 mg | ORAL_TABLET | Freq: Every day | ORAL | 0 refills | Status: DC

## 2018-12-06 MED ORDER — DULOXETINE HCL 60 MG PO CPEP: 60.0000 mg | ORAL_CAPSULE | Freq: Every day | ORAL | 1 refills | Status: DC

## 2018-12-06 NOTE — Progress Notes (Signed)
Name: Sarah Gordon   MRN: [id number]    DOB: [birth year]   Date:12/06/2018       Progress Note  Subjective  Chief Complaint  Chief Complaint  Patient presents with  . Diabetes  . Hypertension  . Hyperlipidemia  . Depression    HPI  Epigastric pain/nausea/bloating: started after an episodes of gastroenteritis, going on for about one month but is getting worse, pain is described as cramping on epigastric area, and radiates to rib cage, sometimes the entire abdomen bothers her, had one episode of vomiting after a meal, states now having nausea or pain on abdomen when she thinks of certain types of food. No weight loss, no jaundice. No fever or chills.   HTN: at home has been controlled, we will recheck before she leaves, last visit it was at goal, no chest pain or palpitation   Ventricular flutter: seen by cardiologist, medication for thyroid adjusted and feeling well since. She states no recent episodes of palpitation   MDD: she is feeling better on duloxetine and denies , she is also happy that church has opened up again, phq 9 has improved. Denies suicidal thoughts or ideation. Long history of depression and took medications in the past  DMII: doing well on life style modification. A1C at goal. Denies polyphagia, polydipsia or polyuria She has a history of neuropathy, very mild symptoms, eye exam is up to date, Dr. Ellin Mayhew and we will get a copy of results  DDD lumbar spine: seen at [facility] , had MRI done and showed herniated disc, taking nsaids and baclofen , has daily pain, and is shooting down both legs but left worse than right, thinking about steroid injections.    Patient Active Problem List   Diagnosis Date Noted  . Ventricular flutter ([place]) 12/06/2018  . MDD (major depressive disorder), recurrent episode, mild ([place]) 09/04/2018  . DDD (degenerative disc disease), cervical 01/23/2016  . Degenerative joint disease of left acromioclavicular joint 01/23/2016  . Recurrent  oral herpes simplex infection 09/01/2015  . Irritable bowel syndrome with diarrhea 08/07/2015  . Statin intolerance 02/28/2015  . Asthma, intermittent 09/10/2014  . Controlled type 2 diabetes with neuropathy ([place]) 09/10/2014  . Fibromyalgia 09/10/2014  . Obstructive sleep apnea 09/10/2014  . Hyperlipidemia 12/29/2012  . Essential hypertension 12/29/2012  . Morbid obesity ([place]) 12/29/2012  . Arthritis due to gout 12/13/2007  . Acid reflux 10/24/2006  . Adult hypothyroidism 10/24/2006  . Localized osteoarthrosis 10/24/2006    Past Surgical History:  Procedure Laterality Date  . BREAST BIOPSY Left    benign  . COLONOSCOPY     Dr Alveta Heimlich  . COLONOSCOPY WITH PROPOFOL N/A 11/19/2015   Procedure: COLONOSCOPY WITH PROPOFOL;  Surgeon: Christene Lye, MD;  Location: [facility] ENDOSCOPY;  Service: Endoscopy;  Laterality: N/A;  . ESOPHAGOGASTRODUODENOSCOPY N/A 08/20/2014   Procedure: ESOPHAGOGASTRODUODENOSCOPY (EGD);  Surgeon: Christene Lye, MD;  Location: [facility] ENDOSCOPY;  Service: Endoscopy;  Laterality: N/A;  . FOOT SURGERY Left    heel spur  . HERNIA REPAIR     umbilical  . NASAL SINUS SURGERY  1980s  . UMBILICAL HERNIA REPAIR      Family History  Problem Relation Age of Onset  . Hypertension Mother   . Hyperlipidemia Mother   . Diabetes Mother   . Heart disease Mother   . Stroke Mother   . Heart disease Brother 82       CABG x 3   . Heart attack Brother 22  . Lupus  Sister   . Arthritis Sister   . Asthma Sister   . Depression Sister   . Diabetes Brother   . Hypertension Brother   . Obesity Brother   . Early death Brother   . Miscarriages / Stillbirths Sister   . Stroke Maternal Grandfather     Social History   Socioeconomic History  . Marital status: Married    Spouse name: Georgena Spurling   . Number of children: 1  . Years of education: Not on file  . Highest education level: Some college, no degree  Occupational History  . Not on file  Social Needs  . Financial  resource strain: Somewhat hard  . Food insecurity    Worry: Never true    Inability: Never true  . Transportation needs    Medical: No    Non-medical: No  Tobacco Use  . Smoking status: Never Smoker  . Smokeless tobacco: Never Used  Substance and Sexual Activity  . Alcohol use: No    Alcohol/week: 0.0 standard drinks    Comment: RARELY  . Drug use: No  . Sexual activity: Never  Lifestyle  . Physical activity    Days per week: 5 days    Minutes per session: 20 min  . Stress: Not at all  Relationships  . Social connections    Talks on phone: More than three times a week    Gets together: More than three times a week    Attends religious service: More than 4 times per year    Active member of club or organization: Yes    Attends meetings of clubs or organizations: More than 4 times per year    Relationship status: Married  . Intimate partner violence    Fear of current or ex partner: No    Emotionally abused: No    Physically abused: No    Forced sexual activity: No  Other Topics Concern  . Not on file  Social History Narrative  . Not on file     Current Outpatient Medications:  .  albuterol (PROVENTIL HFA;VENTOLIN HFA) 108 (90 Base) MCG/ACT inhaler, Inhale 2 puffs into the lungs every 6 (six) hours as needed for wheezing or shortness of breath., Disp: 1 Inhaler, Rfl: 2 .  baclofen (LIORESAL) 10 MG tablet, Take 1-2 tablets (10-20 mg total) by mouth 2 (two) times daily., Disp: 180 each, Rfl: 0 .  DULoxetine (CYMBALTA) 60 MG capsule, Take 1 capsule (60 mg total) by mouth daily., Disp: 90 capsule, Rfl: 1 .  EUTHYROX 100 MCG tablet, TAKE 1 TABLET BY MOUTH ONCE DAILY, Disp: 90 tablet, Rfl: 0 .  ezetimibe (ZETIA) 10 MG tablet, Take 1 tablet by mouth once daily, Disp: 90 tablet, Rfl: 0 .  lisinopril (ZESTRIL) 20 MG tablet, Take 1 tablet (20 mg total) by mouth daily., Disp: 90 tablet, Rfl: 0 .  LORATADINE ALLERGY RELIEF PO, , Disp: , Rfl:  .  magnesium gluconate (MAGONATE) 500  MG tablet, Take 500 mg by mouth daily., Disp: , Rfl:  .  meloxicam (MOBIC) 15 MG tablet, Take 1 tablet (15 mg total) by mouth daily., Disp: 90 tablet, Rfl: 0 .  NON FORMULARY, Take 2 capsules by mouth daily. MicroPlex Food Supplement, Disp: , Rfl:  .  NON FORMULARY, Take 2 capsules by mouth daily. Alpha CRS +, Disp: , Rfl:  .  omeprazole (PRILOSEC) 20 MG capsule, Take 1 capsule (20 mg total) by mouth daily., Disp: 90 capsule, Rfl: 0 .  OVER THE COUNTER MEDICATION,  Take 1 capsule by mouth 2 (two) times daily. Deep blue polyphenol, Disp: , Rfl:  .  OVER THE COUNTER MEDICATION, Take 2 capsules by mouth every morning. XEO Mega, Disp: , Rfl:  .  OVER THE COUNTER MEDICATION, Take 1 capsule by mouth 2 (two) times daily. digestzen, Disp: , Rfl:  .  traMADol (ULTRAM) 50 MG tablet, Take 1 tablet (50 mg total) by mouth daily as needed. To last 90 days, Disp: 30 tablet, Rfl: 0  Allergies  Allergen Reactions  . Aspir-81 [Aspirin] Other (See Comments)    Coagulation disorder  . Lipitor [Atorvastatin] Other (See Comments)    Joint pain  . Statins Other (See Comments)  . Topamax [Topiramate] Other (See Comments)    Cognitive issues   . Vascepa [Icosapent Ethyl] Swelling and Other (See Comments)  . Voltaren [Diclofenac Sodium] Hives and Other (See Comments)    I personally reviewed active problem list, medication list, allergies, family history, social history, health maintenance with the patient/caregiver today.   ROS  Constitutional: Negative for fever no weight change.  Respiratory: Negative for cough and shortness of breath.   Cardiovascular: Negative for chest pain or palpitations.  Gastrointestinal: Positive  for abdominal pain, no bowel changes.  Musculoskeletal: Negative for gait problem or joint swelling.  Skin: Negative for rash.  Neurological: Negative for dizziness or headache.  No other specific complaints in a complete review of systems (except as listed in HPI  above).  Objective  Vitals:   12/06/18 0742 12/06/18 0753  BP: 140/90 (!) 150/90  Pulse:  75  Resp:  16  Temp:  97.7 F (36.5 C)  TempSrc:  Temporal  SpO2:  98%  Weight:  261 lb 1.6 oz (118.4 kg)  Height:  5\' 6"  (1.676 m)    Body mass index is 42.14 kg/m.  Physical Exam  Constitutional: Patient appears well-developed and well-nourished. Obese No distress.  HEENT: head atraumatic, normocephalic, pupils equal and reactive to light Cardiovascular: Normal rate, regular rhythm and normal heart sounds.  No murmur heard. No BLE edema. Pulmonary/Chest: Effort normal and breath sounds normal. No respiratory distress. Abdominal: Soft.  There is no tenderness. Psychiatric: Patient has a normal mood and affect. behavior is normal. Judgment and thought content normal.  Recent Results (from the past 2160 hour(s))  TSH     Status: None   Collection Time: 11/24/18 12:00 AM  Result Value Ref Range   TSH 1.23 0.40 - 4.50 mIU/L  H. pylori breath test     Status: None   Collection Time: 11/28/18 12:00 AM  Result Value Ref Range   H. pylori Breath Test NOT DETECTED NOT DETECT    Comment: . Antimicrobials, proton pump inhibitors, and bismuth preparations are known to suppress H. pylori, and  ingestion of these prior to H. pylori diagnostic testing may lead to false negative results. If clinically  indicated, the test may be repeated on a new specimen obtained two weeks after discontinuing treatment. However, a positive result is still clinically valid.   POCT HgB A1C     Status: Abnormal   Collection Time: 12/06/18  8:56 AM  Result Value Ref Range   Hemoglobin A1C 5.7 (A) 4.0 - 5.6 %   HbA1c POC (<> result, manual entry)     HbA1c, POC (prediabetic range)     HbA1c, POC (controlled diabetic range)        PHQ2/9: Depression screen [facility] 2/9 12/06/2018 11/24/2018 09/04/2018 08/04/2018 12/21/2017  Decreased Interest 0 0 0 0  0  Down, Depressed, Hopeless 1 1 1 1  0  PHQ - 2 Score 1 1 1 1  0   Altered sleeping 2 3 2 1 1   Tired, decreased energy 1 3 2 2 1   Change in appetite 0 3 1 2 1   Feeling bad or failure about yourself  0 0 0 0 0  Trouble concentrating 1 0 0 1 0  Moving slowly or fidgety/restless 0 0 0 0 0  Suicidal thoughts 0 0 0 0 0  PHQ-9 Score 5 10 6 7 3   Difficult doing work/chores Not difficult at all Not difficult at all Somewhat difficult Not difficult at all Not difficult at all    phq 9 is positive   Fall Risk: Fall Risk  12/06/2018 11/24/2018 11/24/2018 08/04/2018 04/06/2018  Falls in the past year? 0 0 0 0 0  Number falls in past yr: 0 0 0 0 0  Injury with Fall? 0 0 0 0 0    Functional Status Survey: Is the patient deaf or have difficulty hearing?: No Does the patient have difficulty seeing, even when wearing glasses/contacts?: No Does the patient have difficulty concentrating, remembering, or making decisions?: No Does the patient have difficulty walking or climbing stairs?: No Does the patient have difficulty dressing or bathing?: No Does the patient have difficulty doing errands alone such as visiting a doctor's office or shopping?: No    Assessment & Plan   1. Controlled type 2 diabetes with neuropathy (HCC)  - POCT HgB A1C  2. MDD (major depressive disorder), recurrent episode, mild (HCC)  - DULoxetine (CYMBALTA) 60 MG capsule; Take 1 capsule (60 mg total) by mouth daily.  Dispense: 90 capsule; Refill: 1  3. DDD (degenerative disc disease), lumbar  - baclofen (LIORESAL) 10 MG tablet; Take 1-2 tablets (10-20 mg total) by mouth 2 (two) times daily.  Dispense: 180 each; Refill: 0 - meloxicam (MOBIC) 15 MG tablet; Take 1 tablet (15 mg total) by mouth daily.  Dispense: 90 tablet; Refill: 0  4. Primary osteoarthritis of both knees  - meloxicam (MOBIC) 15 MG tablet; Take 1 tablet (15 mg total) by mouth daily.  Dispense: 90 tablet; Refill: 0  5. Other specified hypothyroidism  Taking medication as prescribed   6. Need for immunization against  influenza  - Flu Vaccine QUAD High Dose(Fluad)  7. Need for hepatitis B vaccination  - Hepatitis B vaccine adult IM  8. Pain of upper abdomen  - US Abdomen Limited RUQ; Future  9. Nausea  - US Abdomen Limited RUQ; Future  10. Bloating  - US Abdomen Limited RUQ; Future

## 2018-12-07 ENCOUNTER — Encounter: Payer: Self-pay | Admitting: Family Medicine

## 2018-12-08 ENCOUNTER — Other Ambulatory Visit: Payer: Self-pay | Admitting: Family Medicine

## 2018-12-08 ENCOUNTER — Encounter: Payer: Self-pay | Admitting: Family Medicine

## 2018-12-08 DIAGNOSIS — E038 Other specified hypothyroidism: Secondary | ICD-10-CM

## 2018-12-15 DIAGNOSIS — M5416 Radiculopathy, lumbar region: Secondary | ICD-10-CM | POA: Diagnosis not present

## 2018-12-22 ENCOUNTER — Encounter: Payer: Self-pay | Admitting: Family Medicine

## 2018-12-29 ENCOUNTER — Other Ambulatory Visit: Payer: Self-pay

## 2018-12-29 ENCOUNTER — Ambulatory Visit
Admission: RE | Admit: 2018-12-29 | Discharge: 2018-12-29 | Disposition: A | Discharge status: Home / Self Care | Payer: Medicare Other | Source: Ambulatory Visit | Attending: Family Medicine | Admitting: Family Medicine

## 2018-12-29 DIAGNOSIS — R140 Abdominal distension (gaseous): Secondary | ICD-10-CM | POA: Diagnosis not present

## 2018-12-29 DIAGNOSIS — R1010 Upper abdominal pain, unspecified: Secondary | ICD-10-CM

## 2018-12-29 DIAGNOSIS — R1013 Epigastric pain: Secondary | ICD-10-CM | POA: Diagnosis not present

## 2018-12-29 DIAGNOSIS — R110 Nausea: Secondary | ICD-10-CM | POA: Insufficient documentation

## 2018-12-30 ENCOUNTER — Encounter: Payer: Self-pay | Admitting: Family Medicine

## 2019-01-09 ENCOUNTER — Encounter: Payer: Self-pay | Admitting: Gastroenterology

## 2019-01-09 ENCOUNTER — Ambulatory Visit (INDEPENDENT_AMBULATORY_CARE_PROVIDER_SITE_OTHER): Payer: Medicare Other | Admitting: Gastroenterology

## 2019-01-09 ENCOUNTER — Other Ambulatory Visit: Payer: Self-pay

## 2019-01-09 ENCOUNTER — Encounter

## 2019-01-09 VITALS — BP 114/80 | HR 91 | Temp 98.5°F | Resp 17 | Ht 66.0 in | Wt 262.2 lb

## 2019-01-09 DIAGNOSIS — K589 Irritable bowel syndrome without diarrhea: Secondary | ICD-10-CM | POA: Diagnosis not present

## 2019-01-09 DIAGNOSIS — R1013 Epigastric pain: Secondary | ICD-10-CM | POA: Diagnosis not present

## 2019-01-09 NOTE — Progress Notes (Signed)
Cephas Darby, MD [address]  [place]  [place], [place] [address]  Main: [phone]  Fax: [phone]    Gastroenterology Consultation  Referring Provider:     Steele Sizer, MD Primary Care Physician:  Steele Sizer, MD Primary Gastroenterologist:  Dr. Cephas Darby Reason for Consultation: IBS, dyspepsia        HPI:   Sarah Gordon is a 68 y.o. female referred by Dr. Steele Sizer, MD  for consultation & management of IBS and dyspepsia.  2 months ago, patient went to [place] 2 weeks after she returned from [place], started experiencing abdominal cramps, urgency associated with nausea, bloating and diarrhea.  She has history of IBS, typically her flareups are mild and self-limited.  She was seen by Dr. Ancil Boozer as patient felt the symptoms are different from her IBS symptoms, therefore ordered H. pylori breath test given her history of H. pylori infection in the past.  This is negative.  However, her symptoms of upper abdominal pain, nausea and bloating persisted.  She underwent right upper quadrant ultrasound which revealed fatty liver, no evidence of cholelithiasis. Patient is referred to GI due to persistent symptoms, she is currently taking omeprazole 20 mg daily.  Her most recent labs were unremarkable.  She denies any weight loss  She does not smoke or drink alcohol  NSAIDs: None  Antiplts/Anticoagulants/Anti thrombotics: None  GI Procedures:  Colonoscopy 11/19/2015 - The entire examined colon is normal on direct and retroflexion views. - No specimens collected.  Upper endoscopy 08/20/2014 for dysphagia - Normal examined duodenum. - Normal stomach. - Nodular mucosa in the esophagus. Biopsied. - The examination was otherwise normal.  DIAGNOSIS:  A. ESOPHAGUS, LOWER, NODULAR AREA; COLD BIOPSY:  - STRATIFIED SQUAMOUS EPITHELIUM WITH AREAS OF GLYCOGEN ACANTHOSIS.  - NO ACTIVE INFLAMMATION, DYSPLASIA, OR MALIGNANCY.  Past Medical History:   Diagnosis Date  . Allergy   . Asthma   . Cataract   . Esophageal reflux   . Gouty arthropathy, unspecified   . Herpes simplex without mention of complication   . Irritable bowel syndrome   . Localized osteoarthrosis not specified whether primary or secondary, unspecified site   . Migraine, unspecified, without mention of intractable migraine without mention of status migrainosus   . Mixed hyperlipidemia   . Myalgia and myositis, unspecified   . Obstructive sleep apnea (adult) (pediatric)   . Palpitations   . Type II or unspecified type diabetes mellitus without mention of complication, not stated as uncontrolled   . Umbilical hernia without mention of obstruction or gangrene   . Unspecified essential hypertension   . Unspecified hypothyroidism     Past Surgical History:  Procedure Laterality Date  . BREAST BIOPSY Left    benign  . COLONOSCOPY     Dr Alveta Heimlich  . COLONOSCOPY WITH PROPOFOL N/A 11/19/2015   Procedure: COLONOSCOPY WITH PROPOFOL;  Surgeon: Christene Lye, MD;  Location: [facility] ENDOSCOPY;  Service: Endoscopy;  Laterality: N/A;  . ESOPHAGOGASTRODUODENOSCOPY N/A 08/20/2014   Procedure: ESOPHAGOGASTRODUODENOSCOPY (EGD);  Surgeon: Christene Lye, MD;  Location[facility] ENDOSCOPY;  Service: Endoscopy;  Laterality: N/A;  . FOOT SURGERY Left    heel spur  . HERNIA REPAIR     umbilical  . NASAL SINUS SURGERY  1980s  . UMBILICAL HERNIA REPAIR      Current Outpatient Medications:  .  albuterol (PROVENTIL HFA;VENTOLIN HFA) 108 (90 Base) MCG/ACT inhaler, Inhale 2 puffs into the lungs every 6 (six) hours as needed for wheezing  or shortness of breath., Disp: 1 Inhaler, Rfl: 2 .  baclofen (LIORESAL) 10 MG tablet, Take 1-2 tablets (10-20 mg total) by mouth 2 (two) times daily., Disp: 180 each, Rfl: 0 .  DULoxetine (CYMBALTA) 60 MG capsule, Take 1 capsule (60 mg total) by mouth daily., Disp: 90 capsule, Rfl: 1 .  ezetimibe (ZETIA) 10 MG tablet, Take 1 tablet by mouth once  daily, Disp: 90 tablet, Rfl: 0 .  levothyroxine (SYNTHROID) 100 MCG tablet, Take 1 tablet by mouth once daily, Disp: 90 tablet, Rfl: 0 .  lisinopril (ZESTRIL) 20 MG tablet, Take 1 tablet (20 mg total) by mouth daily., Disp: 90 tablet, Rfl: 0 .  meloxicam (MOBIC) 15 MG tablet, Take 1 tablet (15 mg total) by mouth daily., Disp: 90 tablet, Rfl: 0 .  omeprazole (PRILOSEC) 20 MG capsule, Take 1 capsule (20 mg total) by mouth daily., Disp: 90 capsule, Rfl: 0 .  traMADol (ULTRAM) 50 MG tablet, Take 1 tablet (50 mg total) by mouth daily as needed. To last 90 days, Disp: 30 tablet, Rfl: 0 .  LORATADINE ALLERGY RELIEF PO, , Disp: , Rfl:  .  magnesium gluconate (MAGONATE) 500 MG tablet, Take 500 mg by mouth daily., Disp: , Rfl:  .  NON FORMULARY, Take 2 capsules by mouth daily. MicroPlex Food Supplement, Disp: , Rfl:  .  NON FORMULARY, Take 2 capsules by mouth daily. Alpha CRS +, Disp: , Rfl:  .  OVER THE COUNTER MEDICATION, Take 1 capsule by mouth 2 (two) times daily. Deep blue polyphenol, Disp: , Rfl:  .  OVER THE COUNTER MEDICATION, Take 2 capsules by mouth every morning. XEO Mega, Disp: , Rfl:  .  OVER THE COUNTER MEDICATION, Take 1 capsule by mouth 2 (two) times daily. digestzen, Disp: , Rfl:     Family History  Problem Relation Age of Onset  . Hypertension Mother   . Hyperlipidemia Mother   . Diabetes Mother   . Heart disease Mother   . Stroke Mother   . Heart disease Brother 72       CABG x 3   . Heart attack Brother 54  . Lupus Sister   . Arthritis Sister   . Asthma Sister   . Depression Sister   . Diabetes Brother   . Hypertension Brother   . Obesity Brother   . Early death Brother   . Miscarriages / Stillbirths Sister   . Stroke Maternal Grandfather      Social History   Tobacco Use  . Smoking status: Never Smoker  . Smokeless tobacco: Never Used  Substance Use Topics  . Alcohol use: No    Alcohol/week: 0.0 standard drinks    Comment: RARELY  . Drug use: No     Allergies as of 01/09/2019 - Review Complete 01/09/2019  Allergen Reaction Noted  . Aspir-81 [aspirin] Other (See Comments) 07/22/2014  . Lipitor [atorvastatin] Other (See Comments) 02/04/2015  . Statins Other (See Comments) 09/01/2015  . Topamax [topiramate] Other (See Comments) 12/28/2012  . Vascepa [icosapent ethyl] Swelling and Other (See Comments) 07/22/2014  . Voltaren [diclofenac sodium] Hives and Other (See Comments) 12/28/2012    Review of Systems:    All systems reviewed and negative except where noted in HPI.   Physical Exam:  BP 114/80 (BP Location: Left Arm, Patient Position: Sitting, Cuff Size: Large)   Pulse 91   Temp 98.5 F (36.9 C)   Resp 17   Ht 5\' 6"  (1.676 m)   Wt 262 lb  3.2 oz (118.9 kg)   BMI 42.32 kg/m  No LMP recorded. Patient is postmenopausal.  General:   Alert,  Well-developed, well-nourished, pleasant and cooperative in NAD Head:  Normocephalic and atraumatic. Eyes:  Sclera clear, no icterus.   Conjunctiva pink. Ears:  Normal auditory acuity. Nose:  No deformity, discharge, or lesions. Mouth:  No deformity or lesions,oropharynx pink & moist. Neck:  Supple; no masses or thyromegaly. Lungs:  Respirations even and unlabored.  Clear throughout to auscultation.   No wheezes, crackles, or rhonchi. No acute distress. Heart:  Regular rate and rhythm; no murmurs, clicks, rubs, or gallops. Abdomen:  Normal bowel sounds. Soft, non-tender and non-distended without masses, hepatosplenomegaly or hernias noted.  No guarding or rebound tenderness.   Rectal: Not performed Msk:  Symmetrical without gross deformities. Good, equal movement & strength bilaterally. Pulses:  Normal pulses noted. Extremities:  No clubbing or edema.  No cyanosis. Neurologic:  Alert and oriented x3;  grossly normal neurologically. Skin:  Intact without significant lesions or rashes. No jaundice. Lymph Nodes:  No significant cervical adenopathy. Psych:  Alert and cooperative. Normal mood  and affect.  Imaging Studies: Reviewed  Assessment and Plan:   Sarah Gordon is a 68 y.o. female with metabolic syndrome, diarrhea predominant IBS seen in consultation for dyspepsia.  Ultrasound right upper quadrant negative, H. pylori breath test negative.  Patient does not have alarming signs or symptoms  Screen for celiac disease Trial of FD guard Increase omeprazole to 40 mg once a day 20 mg twice daily Endoscopic evaluation if symptoms are persistent    Follow up in 3 months   Cephas Darby, MD

## 2019-01-11 LAB — CELIAC DISEASE PANEL
Endomysial IgA: NEGATIVE
IgA/Immunoglobulin A, Serum: 159 mg/dL (ref 87–352)
Transglutaminase IgA: <2 U/mL (ref 0–3)

## 2019-01-12 DIAGNOSIS — M5416 Radiculopathy, lumbar region: Secondary | ICD-10-CM | POA: Diagnosis not present

## 2019-01-15 ENCOUNTER — Other Ambulatory Visit: Payer: Self-pay | Admitting: Family Medicine

## 2019-01-15 DIAGNOSIS — K219 Gastro-esophageal reflux disease without esophagitis: Secondary | ICD-10-CM

## 2019-02-27 ENCOUNTER — Other Ambulatory Visit: Payer: Self-pay | Admitting: Family Medicine

## 2019-02-27 DIAGNOSIS — E038 Other specified hypothyroidism: Secondary | ICD-10-CM

## 2019-02-27 DIAGNOSIS — M170 Bilateral primary osteoarthritis of knee: Secondary | ICD-10-CM

## 2019-02-27 DIAGNOSIS — M51369 Other intervertebral disc degeneration, lumbar region without mention of lumbar back pain or lower extremity pain: Secondary | ICD-10-CM

## 2019-02-27 DIAGNOSIS — M5136 Other intervertebral disc degeneration, lumbar region: Secondary | ICD-10-CM

## 2019-02-27 DIAGNOSIS — I10 Essential (primary) hypertension: Secondary | ICD-10-CM

## 2019-02-28 NOTE — Telephone Encounter (Signed)
Requested medication (s) are due for refill today: yes  Requested medication (s) are on the active medication list: yes  Last refill:  12/06/2018  Future visit scheduled:yes  Notes to clinic:  Refill cannot be delegated    Requested Prescriptions  Pending Prescriptions Disp Refills   baclofen (LIORESAL) 10 MG tablet [Pharmacy Med Name: Baclofen 10 MG Oral Tablet] 180 tablet 0    Sig: TAKE 1 TO 2 TABLETS BY MOUTH TWICE DAILY     Not Delegated - Analgesics:  Muscle Relaxants Failed - 02/27/2019  9:51 PM      Failed - This refill cannot be delegated      Passed - Valid encounter within last 6 months    Recent Outpatient Visits          2 months ago MDD (major depressive disorder), recurrent episode, mild ([place])   [facility] [place] [place], Drue Stager, MD   3 months ago Dyspepsia   [facility] [place], Drue Stager, MD   5 months ago Controlled type 2 diabetes with neuropathy [facility])   [facility] Steele Sizer, MD   6 months ago Essential hypertension   [facility], Drue Stager, MD   10 months ago Medicare annual wellness visit, initial   [facility], Drue Stager, MD      Future Appointments            In 1 month Ancil Boozer, Drue Stager, [facility]            meloxicam (MOBIC) 15 MG tablet [Pharmacy Med Name: Meloxicam 15 MG Oral Tablet] 90 tablet 0    Sig: Take 1 tablet by mouth once daily     Analgesics:  COX2 Inhibitors Failed - 02/27/2019  9:51 PM      Failed - HGB in normal range and within 360 days    Hemoglobin  Date Value Ref Range Status  02/27/2018 13.1 11.7 - 15.5 g/dL Final         Passed - Cr in normal range and within 360 days    Creat  Date Value Ref Range Status  09/04/2018 0.97 0.50 - 0.99 mg/dL Final    Comment:    For patients >60 years of age, the reference limit for Creatinine is approximately 13% higher for people identified as  African-American. Renella Cunas - Patient is not pregnant      Passed - Valid encounter within last 12 months    Recent Outpatient Visits          2 months ago MDD (major depressive disorder), recurrent episode, mild ([place])   [facility] Steele Sizer, MD   3 months ago Dyspepsia   [facility] [place], Drue Stager, MD   5 months ago Controlled type 2 diabetes with neuropathy [facility])   [facility] Steele Sizer, MD   6 months ago Essential hypertension   [facility] Steele Sizer, MD   10 months ago Medicare annual wellness visit, initial   [facility] Steele Sizer, MD      Future Appointments            In 1 month [facility]           Signed Prescriptions Disp Refills   levothyroxine (SYNTHROID) 100 MCG tablet 90 tablet 0    Sig: Take 1 tablet  by mouth once daily     Endocrinology:  Hypothyroid Agents Failed - 02/27/2019  9:51 PM      Failed - TSH needs to be rechecked within 3 months after an abnormal result. Refill until TSH is due.      Passed - TSH in normal range and within 360 days    TSH  Date Value Ref Range Status  11/24/2018 1.23 0.40 - 4.50 mIU/L Final         Passed - Valid encounter within last 12 months    Recent Outpatient Visits          2 months ago MDD (major depressive disorder), recurrent episode, mild ([place])   [facility] Steele Sizer, MD   3 months ago Dyspepsia   [facility], Drue Stager, MD   5 months ago Controlled type 2 diabetes with neuropathy [facility])   [facility] Steele Sizer, MD   6 months ago Essential hypertension   [facility] [place], Drue Stager, MD   10 months ago Medicare annual wellness visit, initial   [facility] [place], Drue Stager, MD      Future Appointments             In 1 month Steele Sizer, MD [facility]            ezetimibe (ZETIA) 10 MG tablet 90 tablet 0    Sig: Take 1 tablet by mouth once daily     Cardiovascular:  Antilipid - Sterol Transport Inhibitors Failed - 02/27/2019  9:51 PM      Failed - Total Cholesterol in normal range and within 360 days    Cholesterol, Total  Date Value Ref Range Status  09/11/2014 161 100 - 199 mg/dL Final   Cholesterol  Date Value Ref Range Status  09/04/2018 230 (H) <200 mg/dL Final         Failed - LDL in normal range and within 360 days    LDL Cholesterol (Calc)  Date Value Ref Range Status  09/04/2018 157 (H) mg/dL (calc) Final    Comment:    Reference range: <100 . Desirable range <100 mg/dL for primary prevention;   <70 mg/dL for patients with CHD or diabetic patients  with > or = 2 CHD risk factors. Marland Kitchen LDL-C is now calculated using the Martin-Hopkins  calculation, which is a validated novel method providing  better accuracy than the Friedewald equation in the  estimation of LDL-C.  Cresenciano Genre et al. Annamaria Helling. [id number]): [id number]  (http://education.[employer].com/faq/FAQ164)          Failed - HDL in normal range and within 360 days    HDL  Date Value Ref Range Status  09/04/2018 43 (L) > OR = 50 mg/dL Final  09/11/2014 45 >39 mg/dL Final    Comment:    According to ATP-III Guidelines, HDL-C >59 mg/dL is considered a negative risk factor for CHD.          Failed - Triglycerides in normal range and within 360 days    Triglycerides  Date Value Ref Range Status  09/04/2018 165 (H) <150 mg/dL Final         Passed - Valid encounter within last 12 months    Recent Outpatient Visits          2 months ago MDD (major depressive disorder), recurrent episode, mild [facility])   [facility] Steele Sizer, MD   3 months ago Dyspepsia  [facility], Drue Stager, MD   5 months ago Controlled type 2 diabetes with  neuropathy [facility])   [facility] Steele Sizer, MD   6 months ago Essential hypertension   [facility] [place], Drue Stager, MD   10 months ago Medicare annual wellness visit, initial   [facility] [place], Drue Stager, MD      Future Appointments            In 1 month Ancil Boozer, Drue Stager, MD [facility]            lisinopril (ZESTRIL) 20 MG tablet 90 tablet 0    Sig: Take 1 tablet by mouth once daily     Cardiovascular:  ACE Inhibitors Passed - 02/27/2019  9:51 PM      Passed - Cr in normal range and within 180 days    Creat  Date Value Ref Range Status  09/04/2018 0.97 0.50 - 0.99 mg/dL Final    Comment:    For patients >63 years of age, the reference limit for Creatinine is approximately 13% higher for people identified as African-American. .          Passed - K in normal range and within 180 days    Potassium  Date Value Ref Range Status  09/04/2018 4.4 3.5 - 5.3 mmol/L Final         Passed - Patient is not pregnant      Passed - Last BP in normal range    BP Readings from Last 1 Encounters:  01/09/19 114/80         Passed - Valid encounter within last 6 months    Recent Outpatient Visits          2 months ago MDD (major depressive disorder), recurrent episode, mild ([place])   [facility] Steele Sizer, MD   3 months ago Dyspepsia   [facility] [place], Drue Stager, MD   5 months ago Controlled type 2 diabetes with neuropathy [facility])   [facility] Steele Sizer, MD   6 months ago Essential hypertension   [facility] Steele Sizer, MD   10 months ago Medicare annual wellness visit, initial   [facility] Steele Sizer, MD      Future Appointments            In 1 month Ancil Boozer, Drue Stager, MD [facility]

## 2019-03-02 ENCOUNTER — Other Ambulatory Visit: Payer: Self-pay | Admitting: Family Medicine

## 2019-03-02 DIAGNOSIS — F330 Major depressive disorder, recurrent, mild: Secondary | ICD-10-CM

## 2019-04-11 ENCOUNTER — Encounter: Payer: Self-pay | Admitting: Family Medicine

## 2019-04-11 ENCOUNTER — Ambulatory Visit (INDEPENDENT_AMBULATORY_CARE_PROVIDER_SITE_OTHER): Payer: Medicare Other | Admitting: Family Medicine

## 2019-04-11 ENCOUNTER — Other Ambulatory Visit: Payer: Self-pay

## 2019-04-11 VITALS — BP 120/76 | HR 113 | Temp 96.8°F | Resp 16 | Ht 66.0 in | Wt 256.5 lb

## 2019-04-11 DIAGNOSIS — M51369 Other intervertebral disc degeneration, lumbar region without mention of lumbar back pain or lower extremity pain: Secondary | ICD-10-CM

## 2019-04-11 DIAGNOSIS — J392 Other diseases of pharynx: Secondary | ICD-10-CM | POA: Diagnosis not present

## 2019-04-11 DIAGNOSIS — M5136 Other intervertebral disc degeneration, lumbar region: Secondary | ICD-10-CM | POA: Diagnosis not present

## 2019-04-11 DIAGNOSIS — F330 Major depressive disorder, recurrent, mild: Secondary | ICD-10-CM

## 2019-04-11 DIAGNOSIS — I4902 Ventricular flutter: Secondary | ICD-10-CM

## 2019-04-11 DIAGNOSIS — E1140 Type 2 diabetes mellitus with diabetic neuropathy, unspecified: Secondary | ICD-10-CM | POA: Diagnosis not present

## 2019-04-11 DIAGNOSIS — Z1231 Encounter for screening mammogram for malignant neoplasm of breast: Secondary | ICD-10-CM

## 2019-04-11 DIAGNOSIS — I10 Essential (primary) hypertension: Secondary | ICD-10-CM | POA: Diagnosis not present

## 2019-04-11 DIAGNOSIS — J4540 Moderate persistent asthma, uncomplicated: Secondary | ICD-10-CM

## 2019-04-11 LAB — POCT GLYCOSYLATED HEMOGLOBIN (HGB A1C): Hemoglobin A1C: 5.5 % (ref 4.0–5.6)

## 2019-04-11 MED ORDER — MONTELUKAST SODIUM 10 MG PO TABS: 10.0000 mg | ORAL_TABLET | Freq: Every day | ORAL | 1 refills | Status: DC

## 2019-04-11 MED ORDER — FLUTICASONE-SALMETEROL 250-50 MCG/DOSE IN AEPB: 1.0000 | INHALATION_SPRAY | Freq: Two times a day (BID) | RESPIRATORY_TRACT | 3 refills | Status: DC

## 2019-04-11 NOTE — Progress Notes (Signed)
Name: Sarah Gordon   MRN: [id number]    DOB: 1950-05-29   Date:04/11/2019       Progress Note  Subjective  Chief Complaint  Chief Complaint  Patient presents with  . Diabetes  . Depression  . Hypertension  . Hypothyroidism  . Hyperlipidemia    HPI  Throat irritation: going on for years, described as discomfort on right side when it is something dry that she eats, it causes her to have a dry cough, also bothers her at night. She has a history of asthma, also spoke to GI and was advised to take Omeprazole BID and symptoms improved a little, but is still concerned because it is chronic, recurrent and nobody has ever looked inside. We will start her on maintenance asthma medication and if no resolution she will contact me back for referral to ENT  HTN: bp is at goal today, she is compliant with medication, denies chest pain or palpitation.   Ventricular flutter: seen by cardiologist, medication for thyroid adjusted and feeling well since. She states no recent episodes of palpitation . Unchanged   MDD: she is feeling better on duloxetine and denies , she is also happy that church has opened up again, phq 9 has improved. Denies suicidal thoughts or ideation. Long history of depression and took medications in the past  DMII: doing well on life style modification. A1C at goal. Denies polyphagia, polydipsia or polyuria She has a history of neuropathy, very mild symptoms, eye exam is up to date. A1C is down to 5.5%   DDD lumbar spine: seen at [facility] , had MRI done and showed herniated disc, taking nsaids and baclofen , she had steroid injections twice and seems to help, not having daily pain and very seldom radiates down her legs.   Asthma: she has a dry cough , triggered by eating but also every night, explained that we need to start her on maintenance medication. No wheezing or SOB  Morbid Obesity: BMI above 40, she lost weight since last visit, he is eating smaller portions, she  has also been more active  Patient Active Problem List   Diagnosis Date Noted  . Ventricular flutter ([place]) 12/06/2018  . MDD (major depressive disorder), recurrent episode, mild ([place]) 09/04/2018  . DDD (degenerative disc disease), cervical 01/23/2016  . Degenerative joint disease of left acromioclavicular joint 01/23/2016  . Recurrent oral herpes simplex infection 09/01/2015  . Irritable bowel syndrome with diarrhea 08/07/2015  . Statin intolerance 02/28/2015  . Asthma, intermittent 09/10/2014  . Controlled type 2 diabetes with neuropathy ([place]) 09/10/2014  . Fibromyalgia 09/10/2014  . Obstructive sleep apnea 09/10/2014  . Hyperlipidemia 12/29/2012  . Essential hypertension 12/29/2012  . Morbid obesity ([place]) 12/29/2012  . Arthritis due to gout 12/13/2007  . Acid reflux 10/24/2006  . Adult hypothyroidism 10/24/2006  . Localized osteoarthrosis 10/24/2006    Past Surgical History:  Procedure Laterality Date  . BREAST BIOPSY Left    benign  . COLONOSCOPY     Dr Alveta Heimlich  . COLONOSCOPY WITH PROPOFOL N/A 11/19/2015   Procedure: COLONOSCOPY WITH PROPOFOL;  Surgeon: Christene Lye, MD;  Location: [facility] ENDOSCOPY;  Service: Endoscopy;  Laterality: N/A;  . ESOPHAGOGASTRODUODENOSCOPY N/A 08/20/2014   Procedure: ESOPHAGOGASTRODUODENOSCOPY (EGD);  Surgeon: Christene Lye, MD;  Location[facility] ENDOSCOPY;  Service: Endoscopy;  Laterality: N/A;  . FOOT SURGERY Left    heel spur  . HERNIA REPAIR     umbilical  . NASAL SINUS SURGERY  1980s  .  UMBILICAL HERNIA REPAIR      Family History  Problem Relation Age of Onset  . Hypertension Mother   . Hyperlipidemia Mother   . Diabetes Mother   . Heart disease Mother   . Stroke Mother   . Heart disease Brother 87       CABG x 3   . Heart attack Brother 80  . Lupus Sister   . Arthritis Sister   . Asthma Sister   . Depression Sister   . Diabetes Brother   . Hypertension Brother   . Obesity Brother   . Early death Brother   .  Miscarriages / Stillbirths Sister   . Stroke Maternal Grandfather       Current Outpatient Medications:  .  albuterol (PROVENTIL HFA;VENTOLIN HFA) 108 (90 Base) MCG/ACT inhaler, Inhale 2 puffs into the lungs every 6 (six) hours as needed for wheezing or shortness of breath., Disp: 1 Inhaler, Rfl: 2 .  baclofen (LIORESAL) 10 MG tablet, TAKE 1 TO 2 TABLETS BY MOUTH TWICE DAILY, Disp: 180 tablet, Rfl: 0 .  DULoxetine (CYMBALTA) 60 MG capsule, Take 1 capsule by mouth once daily, Disp: 90 capsule, Rfl: 0 .  ezetimibe (ZETIA) 10 MG tablet, Take 1 tablet by mouth once daily, Disp: 90 tablet, Rfl: 0 .  levothyroxine (SYNTHROID) 100 MCG tablet, Take 1 tablet by mouth once daily, Disp: 90 tablet, Rfl: 0 .  lisinopril (ZESTRIL) 20 MG tablet, Take 1 tablet by mouth once daily, Disp: 90 tablet, Rfl: 0 .  LORATADINE ALLERGY RELIEF PO, , Disp: , Rfl:  .  magnesium gluconate (MAGONATE) 500 MG tablet, Take 500 mg by mouth daily., Disp: , Rfl:  .  meloxicam (MOBIC) 15 MG tablet, Take 1 tablet by mouth once daily, Disp: 90 tablet, Rfl: 0 .  NON FORMULARY, Take 2 capsules by mouth daily. [employer] Food Supplement, Disp: , Rfl:  .  NON FORMULARY, Take 2 capsules by mouth daily. Alpha CRS +, Disp: , Rfl:  .  omeprazole (PRILOSEC) 20 MG capsule, Take 1 capsule by mouth once daily, Disp: 90 capsule, Rfl: 0 .  OVER THE COUNTER MEDICATION, Take 1 capsule by mouth 2 (two) times daily. Deep blue polyphenol, Disp: , Rfl:  .  OVER THE COUNTER MEDICATION, Take 2 capsules by mouth every morning. XEO Mega, Disp: , Rfl:  .  OVER THE COUNTER MEDICATION, Take 1 capsule by mouth 2 (two) times daily. digestzen, Disp: , Rfl:  .  traMADol (ULTRAM) 50 MG tablet, Take 1 tablet (50 mg total) by mouth daily as needed. To last 90 days, Disp: 30 tablet, Rfl: 0 .  Fluticasone-Salmeterol (ADVAIR DISKUS) 250-50 MCG/DOSE AEPB, Inhale 1 puff into the lungs 2 (two) times daily., Disp: 60 each, Rfl: 3 .  montelukast (SINGULAIR) 10 MG tablet,  Take 1 tablet (10 mg total) by mouth at bedtime., Disp: 90 tablet, Rfl: 1  Allergies  Allergen Reactions  . Aspir-81 [Aspirin] Other (See Comments)    Coagulation disorder  . Lipitor [Atorvastatin] Other (See Comments)    Joint pain  . Statins Other (See Comments)  . Topamax [Topiramate] Other (See Comments)    Cognitive issues   . Vascepa [Icosapent Ethyl] Swelling and Other (See Comments)  . Voltaren [Diclofenac Sodium] Hives and Other (See Comments)    I personally reviewed active problem list, medication list, allergies, family history, social history with the patient/caregiver today.   ROS  Constitutional: Negative for fever , positive for  weight change.  Respiratory: Positive  for cough but no  shortness of breath.   Cardiovascular: Negative for chest pain or palpitations.  Gastrointestinal: Negative for abdominal pain, no bowel changes.  Musculoskeletal: Negative for gait problem or joint swelling.  Skin: Negative for rash.  Neurological: Negative for dizziness or headache.  No other specific complaints in a complete review of systems (except as listed in HPI above).  Objective   Vitals:   04/11/19 0839  BP: 120/76  Pulse: (!) 113  Resp: 16  Temp: (!) 96.8 F (36 C)  TempSrc: Temporal  SpO2: 98%  Weight: 256 lb 8 oz (116.3 kg)  Height: 5\' 6"  (1.676 m)    Body mass index is 41.4 kg/m.  Physical Exam  Constitutional: Patient appears well-developed and well-nourished. Obese  No distress.  HEENT: head atraumatic, normocephalic, pupils equal and reactive to ligh Cardiovascular: Normal rate, regular rhythm and normal heart sounds.  No murmur heard. No BLE edema. Pulmonary/Chest: Effort normal and breath sounds normal. No respiratory distress. Abdominal: Soft.  There is no tenderness. Psychiatric: Patient has a normal mood and affect. behavior is normal. Judgment and thought content normal.  PHQ2/9: Depression screen [facility] 2/9 04/11/2019 12/06/2018 11/24/2018  09/04/2018 08/04/2018  Decreased Interest 0 0 0 0 0  Down, Depressed, Hopeless 1 1 1 1 1   PHQ - 2 Score 1 1 1 1 1   Altered sleeping 0 2 3 2 1   Tired, decreased energy 0 1 3 2 2   Change in appetite 0 0 3 1 2   Feeling bad or failure about yourself  0 0 0 0 0  Trouble concentrating 0 1 0 0 1  Moving slowly or fidgety/restless 0 0 0 0 0  Suicidal thoughts 0 0 0 0 0  PHQ-9 Score 1 5 10 6 7   Difficult doing work/chores Not difficult at all Not difficult at all Not difficult at all Somewhat difficult Not difficult at all  Some recent data might be hidden    phq 9 is positive   Fall Risk: Fall Risk  04/11/2019 12/06/2018 11/24/2018 11/24/2018 08/04/2018  Falls in the past year? 0 0 0 0 0  Number falls in past yr: 0 0 0 0 0  Injury with Fall? 0 0 0 0 0     Functional Status Survey: Is the patient deaf or have difficulty hearing?: No Does the patient have difficulty seeing, even when wearing glasses/contacts?: No Does the patient have difficulty concentrating, remembering, or making decisions?: No Does the patient have difficulty walking or climbing stairs?: No Does the patient have difficulty dressing or bathing?: No Does the patient have difficulty doing errands alone such as visiting a doctor's office or shopping?: No    Assessment & Plan  1. Controlled type 2 diabetes with neuropathy (HCC)  - POCT HgB A1C  2. MDD (major depressive disorder), recurrent episode, mild (HCC)  Continue Duloxetine  3. DDD (degenerative disc disease), lumbar  Doing better   4. Ventricular flutter ([place])  stable  5. Essential hypertension  At goal   6. Morbid obesity ([place])  Discussed with the patient the risk posed by an increased BMI. Discussed importance of portion control, calorie counting and at least 150 minutes of physical activity weekly. Avoid sweet beverages and drink more water. Eat at least 6 servings of fruit and vegetables daily   7. Moderate persistent asthma without  complication  - Fluticasone-Salmeterol (ADVAIR DISKUS) 250-50 MCG/DOSE AEPB; Inhale 1 puff into the lungs 2 (two) times daily.  Dispense: 60 each; Refill: 3 -  montelukast (SINGULAIR) 10 MG tablet; Take 1 tablet (10 mg total) by mouth at bedtime.  Dispense: 90 tablet; Refill: 1  8. Encounter for screening mammogram for breast cancer  - MM 3D SCREEN BREAST BILATERAL  9. Throat irritation  Refer to ENT if no improvement with Advair

## 2019-04-17 ENCOUNTER — Ambulatory Visit: Payer: Medicare Other

## 2019-04-17 DIAGNOSIS — E785 Hyperlipidemia, unspecified: Secondary | ICD-10-CM | POA: Diagnosis not present

## 2019-04-17 DIAGNOSIS — I10 Essential (primary) hypertension: Secondary | ICD-10-CM | POA: Diagnosis not present

## 2019-04-17 DIAGNOSIS — Z8249 Family history of ischemic heart disease and other diseases of the circulatory system: Secondary | ICD-10-CM | POA: Diagnosis not present

## 2019-04-17 DIAGNOSIS — E0921 Drug or chemical induced diabetes mellitus with diabetic nephropathy: Secondary | ICD-10-CM | POA: Diagnosis not present

## 2019-04-26 ENCOUNTER — Ambulatory Visit: Payer: Medicare Other | Attending: Internal Medicine

## 2019-04-26 DIAGNOSIS — Z23 Encounter for immunization: Secondary | ICD-10-CM | POA: Insufficient documentation

## 2019-04-26 NOTE — Progress Notes (Signed)
   Covid-19 Vaccination Clinic  Name:  Sarah Gordon    MRN: [id number] DOB: Jul 30, 1950  04/26/2019  [name] was observed post Covid-19 immunization for 15 minutes without incidence. She was provided with Vaccine Information Sheet and instruction to access the V-Safe system.   [name] was instructed to call 911 with any severe reactions post vaccine: Marland Kitchen Difficulty breathing  . Swelling of your face and throat  . A fast heartbeat  . A bad rash all over your body  . Dizziness and weakness    Immunizations Administered    Name Date Dose VIS Date Route   Pfizer COVID-19 Vaccine 04/26/2019  4:41 PM 0.3 mL 03/02/2019 Intramuscular   Manufacturer: [place]   Lot: [id number]   [place]: [id number]

## 2019-05-04 ENCOUNTER — Ambulatory Visit: Payer: Medicare Other

## 2019-05-18 ENCOUNTER — Ambulatory Visit: Payer: Medicare Other

## 2019-05-22 ENCOUNTER — Ambulatory Visit: Payer: Medicare Other | Attending: Internal Medicine

## 2019-05-22 DIAGNOSIS — Z23 Encounter for immunization: Secondary | ICD-10-CM

## 2019-05-22 NOTE — Progress Notes (Signed)
   Covid-19 Vaccination Clinic  Name:  Sarah Gordon    MRN: [id number] DOB: [birth year]  05/22/2019  Sarah Gordon was observed post Covid-19 immunization for 15 minutes without incident. She was provided with Vaccine Information Sheet and instruction to access the V-Safe system.   Sarah Gordon was instructed to call 911 with any severe reactions post vaccine: Marland Kitchen Difficulty breathing  . Swelling of face and throat  . A fast heartbeat  . A bad rash all over body  . Dizziness and weakness   Immunizations Administered    Name Date Dose VIS Date Route   Pfizer COVID-19 Vaccine 05/22/2019  8:26 AM 0.3 mL 03/02/2019 Intramuscular   Manufacturer: [place]   Lot: [id number]   [place]: [id number]

## 2019-05-24 ENCOUNTER — Ambulatory Visit (INDEPENDENT_AMBULATORY_CARE_PROVIDER_SITE_OTHER): Payer: Medicare Other

## 2019-05-24 ENCOUNTER — Other Ambulatory Visit: Payer: Self-pay

## 2019-05-24 VITALS — Temp 97.5°F

## 2019-05-24 DIAGNOSIS — Z780 Asymptomatic menopausal state: Secondary | ICD-10-CM

## 2019-05-24 DIAGNOSIS — Z0000 Encounter for general adult medical examination without abnormal findings: Secondary | ICD-10-CM

## 2019-05-24 NOTE — Patient Instructions (Signed)
Sarah Gordon , Thank you for taking time to come for your Medicare Wellness Visit. I appreciate your ongoing commitment to your health goals. Please review the following plan we discussed and let me know if I can assist you in the future.   Screening recommendations/referrals: Colonoscopy: done 11/19/15. Repeat in 2027 Mammogram: done 11/20/14. Please call [phone] to schedule your mammogram and bone density screening.  Bone Density: done 11/20/14 Recommended yearly ophthalmology/optometry visit for glaucoma screening and checkup Recommended yearly dental visit for hygiene and checkup  Vaccinations: Influenza vaccine: done 12/06/18 Pneumococcal vaccine: done 02/27/18 Tdap vaccine: done 10/12/11 Shingles vaccine: Shingrix discussed. Please contact your pharmacy for coverage information.  Covid-19: done 04/26/19 & 05/22/19   Advanced directives: Advance directive discussed with you today. I have provided a copy for you to complete at home and have notarized. Once this is complete please bring a copy in to our office so we can scan it into your chart.  Conditions/risks identified: Recommend healthy eating and physical activity for desired weight loss  Next appointment: Please follow up in one year for your Medicare Annual Wellness visit.     Preventive Care 67 Years and Older, Female Preventive care refers to lifestyle choices and visits with your health care provider that can promote health and wellness. What does preventive care include?  A yearly physical exam. This is also called an annual well check.  Dental exams once or twice a year.  Routine eye exams. Ask your health care provider how often you should have your eyes checked.  Personal lifestyle choices, including:  Daily care of your teeth and gums.  Regular physical activity.  Eating a healthy diet.  Avoiding tobacco and drug use.  Limiting alcohol use.  Practicing safe sex.  Taking low-dose aspirin every day.  Taking  vitamin and mineral supplements as recommended by your health care provider. What happens during an annual well check? The services and screenings done by your health care provider during your annual well check will depend on your age, overall health, lifestyle risk factors, and family history of disease. Counseling  Your health care provider may ask you questions about your:  Alcohol use.  Tobacco use.  Drug use.  Emotional well-being.  Home and relationship well-being.  Sexual activity.  Eating habits.  History of falls.  Memory and ability to understand (cognition).  Work and work Statistician.  Reproductive health. Screening  You may have the following tests or measurements:  Height, weight, and BMI.  Blood pressure.  Lipid and cholesterol levels. These may be checked every 5 years, or more frequently if you are over 25 years old.  Skin check.  Lung cancer screening. You may have this screening every year starting at age 80 if you have a 30-pack-year history of smoking and currently smoke or have quit within the past 15 years.  Fecal occult blood test (FOBT) of the stool. You may have this test every year starting at age 39.  Flexible sigmoidoscopy or colonoscopy. You may have a sigmoidoscopy every 5 years or a colonoscopy every 10 years starting at age 23.  Hepatitis C blood test.  Hepatitis B blood test.  Sexually transmitted disease (STD) testing.  Diabetes screening. This is done by checking your blood sugar (glucose) after you have not eaten for a while (fasting). You may have this done every 1-3 years.  Bone density scan. This is done to screen for osteoporosis. You may have this done starting at age 25.  Mammogram. This  may be done every 1-2 years. Talk to your health care provider about how often you should have regular mammograms. Talk with your health care provider about your test results, treatment options, and if necessary, the need for more  tests. Vaccines  Your health care provider may recommend certain vaccines, such as:  Influenza vaccine. This is recommended every year.  Tetanus, diphtheria, and acellular pertussis (Tdap, Td) vaccine. You may need a Td booster every 10 years.  Zoster vaccine. You may need this after age 24.  Pneumococcal 13-valent conjugate (PCV13) vaccine. One dose is recommended after age 44.  Pneumococcal polysaccharide (PPSV23) vaccine. One dose is recommended after age 68. Talk to your health care provider about which screenings and vaccines you need and how often you need them. This information is not intended to replace advice given to you by your health care provider. Make sure you discuss any questions you have with your health care provider. Document Released: 04/04/2015 Document Revised: 11/26/2015 Document Reviewed: 01/07/2015 Elsevier Interactive Patient Education  2017 [employer] Prevention in the Home Falls can cause injuries. They can happen to people of all ages. There are many things you can do to make your home safe and to help prevent falls. What can I do on the outside of my home?  Regularly fix the edges of walkways and driveways and fix any cracks.  Remove anything that might make you trip as you walk through a door, such as a raised step or threshold.  Trim any bushes or trees on the path to your home.  Use bright outdoor lighting.  Clear any walking paths of anything that might make someone trip, such as rocks or tools.  Regularly check to see if handrails are loose or broken. Make sure that both sides of any steps have handrails.  Any raised decks and porches should have guardrails on the edges.  Have any leaves, snow, or ice cleared regularly.  Use sand or salt on walking paths during winter.  Clean up any spills in your garage right away. This includes oil or grease spills. What can I do in the bathroom?  Use night lights.  Install grab bars by the  toilet and in the tub and shower. Do not use towel bars as grab bars.  Use non-skid mats or decals in the tub or shower.  If you need to sit down in the shower, use a plastic, non-slip stool.  Keep the floor dry. Clean up any water that spills on the floor as soon as it happens.  Remove soap buildup in the tub or shower regularly.  Attach bath mats securely with double-sided non-slip rug tape.  Do not have throw rugs and other things on the floor that can make you trip. What can I do in the bedroom?  Use night lights.  Make sure that you have a light by your bed that is easy to reach.  Do not use any sheets or blankets that are too big for your bed. They should not hang down onto the floor.  Have a firm chair that has side arms. You can use this for support while you get dressed.  Do not have throw rugs and other things on the floor that can make you trip. What can I do in the kitchen?  Clean up any spills right away.  Avoid walking on wet floors.  Keep items that you use a lot in easy-to-reach places.  If you need to reach something above  you, use a strong step stool that has a grab bar.  Keep electrical cords out of the way.  Do not use floor polish or wax that makes floors slippery. If you must use wax, use non-skid floor wax.  Do not have throw rugs and other things on the floor that can make you trip. What can I do with my stairs?  Do not leave any items on the stairs.  Make sure that there are handrails on both sides of the stairs and use them. Fix handrails that are broken or loose. Make sure that handrails are as long as the stairways.  Check any carpeting to make sure that it is firmly attached to the stairs. Fix any carpet that is loose or worn.  Avoid having throw rugs at the top or bottom of the stairs. If you do have throw rugs, attach them to the floor with carpet tape.  Make sure that you have a light switch at the top of the stairs and the bottom of  the stairs. If you do not have them, ask someone to add them for you. What else can I do to help prevent falls?  Wear shoes that:  Do not have high heels.  Have rubber bottoms.  Are comfortable and fit you well.  Are closed at the toe. Do not wear sandals.  If you use a stepladder:  Make sure that it is fully opened. Do not climb a closed stepladder.  Make sure that both sides of the stepladder are locked into place.  Ask someone to hold it for you, if possible.  Clearly mark and make sure that you can see:  Any grab bars or handrails.  First and last steps.  Where the edge of each step is.  Use tools that help you move around (mobility aids) if they are needed. These include:  Canes.  Walkers.  Scooters.  Crutches.  Turn on the lights when you go into a dark area. Replace any light bulbs as soon as they burn out.  Set up your furniture so you have a clear path. Avoid moving your furniture around.  If any of your floors are uneven, fix them.  If there are any pets around you, be aware of where they are.  Review your medicines with your doctor. Some medicines can make you feel dizzy. This can increase your chance of falling. Ask your doctor what other things that you can do to help prevent falls. This information is not intended to replace advice given to you by your health care provider. Make sure you discuss any questions you have with your health care provider. Document Released: 01/02/2009 Document Revised: 08/14/2015 Document Reviewed: 04/12/2014 Elsevier Interactive Patient Education  2017 [employer].

## 2019-05-24 NOTE — Progress Notes (Addendum)
Subjective:   Sarah Gordon is a 69 y.o. female who presents for an Initial Medicare Annual Wellness Visit.  Virtual Visit via Telephone Note  I connected with [name] on 05/24/19 at  9:20 AM EST by telephone and verified that I am speaking with the correct person using two identifiers.  Medicare Annual Wellness visit completed telephonically due to Covid-19 pandemic.   Location: Patient: home Provider: office   I discussed the limitations, risks, security and privacy concerns of performing an evaluation and management service by telephone and the availability of in person appointments. The patient expressed understanding and agreed to proceed.  Some vital signs may be absent or patient reported.   [name], LPN    Review of Systems      Cardiac Risk Factors include: advanced age (>77men, >63 women);diabetes mellitus;dyslipidemia;hypertension;obesity (BMI >30kg/m2)     Objective:    Today's Vitals   05/24/19 0925 05/24/19 0926  Temp: (!) 97.5 F (36.4 C)   PainSc:  4    There is no height or weight on file to calculate BMI.  Advanced Directives 05/24/2019 02/25/2017 05/06/2016 01/23/2016 11/17/2015 09/01/2015 08/07/2015  Does Patient Have a Medical Advance Directive? No No No No No No No  Would patient like information on creating a medical advance directive? Yes (MAU/Ambulatory/Procedural Areas - Information given) No - Patient declined - No - patient declined information No - patient declined information No - patient declined information No - patient declined information    Current Medications (verified) Outpatient Encounter Medications as of 05/24/2019  Medication Sig  . baclofen (LIORESAL) 10 MG tablet TAKE 1 TO 2 TABLETS BY MOUTH TWICE DAILY  . DULoxetine (CYMBALTA) 60 MG capsule Take 1 capsule by mouth once daily  . ezetimibe (ZETIA) 10 MG tablet Take 1 tablet by mouth once daily  . levothyroxine (SYNTHROID) 100 MCG tablet Take 1 tablet by mouth once daily  .  lisinopril (ZESTRIL) 20 MG tablet Take 1 tablet by mouth once daily  . magnesium gluconate (MAGONATE) 500 MG tablet Take 500 mg by mouth daily.  . meloxicam (MOBIC) 15 MG tablet Take 1 tablet by mouth once daily  . Multiple Vitamins-Minerals (HAIR SKIN NAILS PO) Take 1 tablet by mouth daily.  Marland Kitchen omeprazole (PRILOSEC) 20 MG capsule Take 1 capsule by mouth once daily  . OVER THE COUNTER MEDICATION Take 1 capsule by mouth 2 (two) times daily. digestzen  . albuterol (PROVENTIL HFA;VENTOLIN HFA) 108 (90 Base) MCG/ACT inhaler Inhale 2 puffs into the lungs every 6 (six) hours as needed for wheezing or shortness of breath. (Patient not taking: Reported on 05/24/2019)  . Fluticasone-Salmeterol (ADVAIR DISKUS) 250-50 MCG/DOSE AEPB Inhale 1 puff into the lungs 2 (two) times daily. (Patient not taking: Reported on 05/24/2019)  . LORATADINE ALLERGY RELIEF PO   . montelukast (SINGULAIR) 10 MG tablet Take 1 tablet (10 mg total) by mouth at bedtime. (Patient not taking: Reported on 05/24/2019)  . traMADol (ULTRAM) 50 MG tablet Take 1 tablet (50 mg total) by mouth daily as needed. To last 90 days (Patient not taking: Reported on 05/24/2019)  . [DISCONTINUED] NON FORMULARY Take 2 capsules by mouth daily. MicroPlex Food Supplement  . [DISCONTINUED] NON FORMULARY Take 2 capsules by mouth daily. Alpha CRS +  . [DISCONTINUED] OVER THE COUNTER MEDICATION Take 1 capsule by mouth 2 (two) times daily. Deep blue polyphenol  . [DISCONTINUED] OVER THE COUNTER MEDICATION Take 2 capsules by mouth every morning. XEO Mega   No facility-administered  encounter medications on file as of 05/24/2019.    Allergies (verified) Aspir-81 [aspirin], Lipitor [atorvastatin], Statins, Topamax [topiramate], Vascepa [icosapent ethyl], and Voltaren [diclofenac sodium]   History: Past Medical History:  Diagnosis Date  . Allergy   . Asthma   . Cataract   . Esophageal reflux   . Gouty arthropathy, unspecified   . Herpes simplex without mention of  complication   . Irritable bowel syndrome   . Localized osteoarthrosis not specified whether primary or secondary, unspecified site   . Migraine, unspecified, without mention of intractable migraine without mention of status migrainosus   . Mixed hyperlipidemia   . Myalgia and myositis, unspecified   . Obstructive sleep apnea (adult) (pediatric)   . Palpitations   . Type II or unspecified type diabetes mellitus without mention of complication, not stated as uncontrolled   . Umbilical hernia without mention of obstruction or gangrene   . Unspecified essential hypertension   . Unspecified hypothyroidism    Past Surgical History:  Procedure Laterality Date  . BREAST BIOPSY Left    benign  . COLONOSCOPY     Dr Alveta Heimlich  . COLONOSCOPY WITH PROPOFOL N/A 11/19/2015   Procedure: COLONOSCOPY WITH PROPOFOL;  Surgeon: Christene Lye, MD;  Location: [facility] ENDOSCOPY;  Service: Endoscopy;  Laterality: N/A;  . ESOPHAGOGASTRODUODENOSCOPY N/A 08/20/2014   Procedure: ESOPHAGOGASTRODUODENOSCOPY (EGD);  Surgeon: Christene Lye, MD;  [facility] ENDOSCOPY;  Service: Endoscopy;  Laterality: N/A;  . FOOT SURGERY Left    heel spur  . HERNIA REPAIR     umbilical  . NASAL SINUS SURGERY  1980s  . UMBILICAL HERNIA REPAIR     Family History  Problem Relation Age of Onset  . Hypertension Mother   . Hyperlipidemia Mother   . Diabetes Mother   . Heart disease Mother   . Stroke Mother   . Heart disease Brother 41       CABG x 3   . Heart attack Brother 45  . Lupus Sister   . Arthritis Sister   . Asthma Sister   . Depression Sister   . Diabetes Brother   . Hypertension Brother   . Obesity Brother   . Early death Brother   . Miscarriages / Stillbirths Sister   . Stroke Maternal Grandfather    Social History   Socioeconomic History  . Marital status: Married    Spouse name: Georgena Spurling   . Number of children: 1  . Years of education: Not on file  . Highest education level: Some college, no  degree  Occupational History    Comment: works part time at [employer]  . Smoking status: Never Smoker  . Smokeless tobacco: Never Used  Substance and Sexual Activity  . Alcohol use: No    Alcohol/week: 0.0 standard drinks    Comment: RARELY  . Drug use: No  . Sexual activity: Never  Other Topics Concern  . Not on file  Social History Narrative  . Not on file   Social Determinants of Health   Financial Resource Strain: Low Risk   . Difficulty of Paying Living Expenses: Not very hard  Food Insecurity: No Food Insecurity  . Worried About Charity fundraiser in the Last Year: Never true  . Ran Out of Food in the Last Year: Never true  Transportation Needs: No Transportation Needs  . Lack of Transportation (Medical): No  . Lack of Transportation (Non-Medical): No  Physical Activity: Inactive  . Days of Exercise per  Week: 0 days  . Minutes of Exercise per Session: 0 min  Stress: No Stress Concern Present  . Feeling of Stress : Not at all  Social Connections: Not Isolated  . Frequency of Communication with Friends and Family: More than three times a week  . Frequency of Social Gatherings with Friends and Family: More than three times a week  . Attends Religious Services: More than 4 times per year  . Active Member of Clubs or Organizations: Yes  . Attends Archivist Meetings: More than 4 times per year  . Marital Status: Married    Tobacco Counseling Counseling given: Not Answered   Clinical Intake:  Pre-visit preparation completed: Yes  Pain : 0-10 Pain Score: 4  Pain Type: Chronic pain Pain Location: Back Pain Orientation: Right, Lower Pain Descriptors / Indicators: Aching, Dull, Sore Pain Onset: More than a month ago Pain Frequency: Constant     Nutritional Risks: None Diabetes: Yes CBG done?: No CBG resulted in Enter/ Edit results?: No Did pt. bring in CBG monitor from home?: No   Nutrition Risk Assessment:  Has the patient had any  N/V/D within the last 2 months?  No  Does the patient have any non-healing wounds?  No  Has the patient had any unintentional weight loss or weight gain?  No   Diabetes:  Is the patient diabetic?  Yes  If diabetic, was a CBG obtained today?  No  Did the patient bring in their glucometer from home?  No  How often do you monitor your CBG's? n/a.   Financial Strains and Diabetes Management:  Are you having any financial strains with the device, your supplies or your medication? No .  Does the patient want to be seen by Chronic Care Management for management of their diabetes?  No  Would the patient like to be referred to a Nutritionist or for Diabetic Management?  No   Diabetic Exams:  Diabetic Eye Exam: Completed 07/25/18.   Diabetic Foot Exam: Completed 09/04/18.    How often do you need to have someone help you when you read instructions, pamphlets, or other written materials from your doctor or pharmacy?: 1 - Never  Interpreter Needed?: No  Information entered by :: [name] LPN   Activities of Daily Living In your present state of health, do you have any difficulty performing the following activities: 05/24/2019 04/11/2019  Hearing? N N  Comment declines hearing aids -  Vision? N N  Difficulty concentrating or making decisions? N N  Walking or climbing stairs? N N  Dressing or bathing? N N  Doing errands, shopping? N N  Preparing Food and eating ? N -  Using the Toilet? N -  In the past six months, have you accidently leaked urine? N -  Do you have problems with loss of bowel control? N -  Managing your Medications? N -  Managing your Finances? N -  Housekeeping or managing your Housekeeping? N -  Some recent data might be hidden     Immunizations and Health Maintenance Immunization History  Administered Date(s) Administered  . Fluad Quad(high Dose 65+) 12/06/2018  . Hepatitis B, adult 02/27/2018  . Hepb-cpg 12/06/2018  . Influenza, High Dose Seasonal PF  02/27/2018  . PFIZER SARS-COV-2 Vaccination 04/26/2019, 05/22/2019  . Pneumococcal Conjugate-13 07/30/2013  . Pneumococcal Polysaccharide-23 10/12/2011, 02/27/2018  . Tdap 10/12/2011  . Zoster 08/06/2010   Health Maintenance Due  Topic Date Due  . MAMMOGRAM  11/19/2016  Patient Care Team: Steele Sizer, MD as PCP - General (Family Medicine) Christene Lye, MD (General Surgery) [place], [place] as Consulting Physician (Optometry)  Indicate any recent Medical Services you may have received from other than [facility] providers in the past year (date may be approximate).     Assessment:   This is a routine wellness examination for Sarah Gordon.  Hearing/Vision screen  Hearing Screening   125Hz  250Hz  500Hz  1000Hz  2000Hz  3000Hz  4000Hz  6000Hz  8000Hz   Right ear:           Left ear:           Comments: Pt denies hearing difficulty  Vision Screening Comments: Annual vision screenings done at [facility] issues and exercise activities discussed: Current Exercise Habits: The patient does not participate in regular exercise at present, Exercise limited by: orthopedic condition(s)  Goals    . Weight (lb) < 240 lb (108.9 kg)     Pt would like to lose 20 lbs over the next year with healthy eating and exercise      Depression Screen PHQ 2/9 Scores 05/24/2019 04/11/2019 12/06/2018 11/24/2018 09/04/2018 08/04/2018 12/21/2017  PHQ - 2 Score 0 1 1 1 1 1  0  PHQ- 9 Score - 1 5 10 6 7 3     Fall Risk Fall Risk  05/24/2019 04/11/2019 12/06/2018 11/24/2018 11/24/2018  Falls in the past year? 0 0 0 0 0  Number falls in past yr: 0 0 0 0 0  Injury with Fall? 0 0 0 0 0  Risk for fall due to : Orthopedic patient - - - -  Follow up Falls prevention discussed - - - -    FALL RISK PREVENTION PERTAINING TO THE HOME:  Any stairs in or around the home? Yes  If so, do they handrails? No  1 step outside  Home free of loose throw rugs in walkways, pet beds, electrical cords, etc? Yes  Adequate  lighting in your home to reduce risk of falls? Yes   ASSISTIVE DEVICES UTILIZED TO PREVENT FALLS:  Life alert? No  Use of a cane, walker or w/c? No  Grab bars in the bathroom? Yes  Shower chair or bench in shower? No  Elevated toilet seat or a handicapped toilet? Yes   DME ORDERS:  DME order needed?  No   TIMED UP AND GO:  Was the test performed? No . Telephonic visit.   Education: Fall risk prevention has been discussed.  Intervention(s) required? No    Cognitive Function:     6CIT Screen 05/24/2019  What Year? 0 points  What month? 0 points  What time? 0 points  Count back from 20 0 points  Months in reverse 0 points  Repeat phrase 0 points  Total Score 0    Screening Tests Health Maintenance  Topic Date Due  . MAMMOGRAM  11/19/2016  . OPHTHALMOLOGY EXAM  07/25/2019  . FOOT EXAM  09/04/2019  . HEMOGLOBIN A1C  10/09/2019  . TETANUS/TDAP  10/11/2021  . COLONOSCOPY  11/18/2025  . INFLUENZA VACCINE  Completed  . DEXA SCAN  Completed  . Hepatitis C Screening  Completed  . PNA vac Low Risk Adult  Completed    Qualifies for Shingles Vaccine? Yes  Zostavax completed 2012. Due for Shingrix. Education has been provided regarding the importance of this vaccine. Pt has been advised to call insurance company to determine out of pocket expense. Advised may also receive vaccine at local pharmacy or Health Dept.  Verbalized acceptance and understanding.  Tdap: Up to date  Flu Vaccine: Up to date  Pneumococcal Vaccine: Up to date   Cancer Screenings:  Colorectal Screening: Completed 11/19/15. Repeat every 10 years  Mammogram: Completed 11/20/14. Repeat every year. Ordered 04/11/19. Pt provided with contact information and advised to call to schedule appt.   Bone Density: Completed 11/20/14. Results reflect NORMAL. Repeat every 2 years. Ordered today. Pt provided with contact information and advised to call to schedule appt.   Lung Cancer Screening: (Low Dose CT Chest  recommended if Age 58-80 years, 30 pack-year currently smoking OR have quit w/in 15years.) does not qualify.    Additional Screening:  Hepatitis C Screening: does qualify; Completed 10/23/15  Vision Screening: Recommended annual ophthalmology exams for early detection of glaucoma and other disorders of the eye. Is the patient up to date with their annual eye exam?  Yes  Who is the provider or what is the name of the office in which the pt attends annual eye exams? [place] Screening: Recommended annual dental exams for proper oral hygiene  Community Resource Referral:  CRR required this visit?  No      Plan:    I have personally reviewed and addressed the Medicare Annual Wellness questionnaire and have noted the following in the patient's chart:  A. Medical and social history B. Use of alcohol, tobacco or illicit drugs  C. Current medications and supplements D. Functional ability and status E.  Nutritional status F.  Physical activity G. Advance directives H. List of other physicians I.  Hospitalizations, surgeries, and ER visits in previous 12 months J.  Echo such as hearing and vision if needed, cognitive and depression L. Referrals and appointments   In addition, I have reviewed and discussed with patient certain preventive protocols, quality metrics, and best practice recommendations. A written personalized care plan for preventive services as well as general preventive health recommendations were provided to patient.   Signed,  [name], LPN Nurse Health Advisor   Nurse Notes: pt states she is currently not taking albuterol, advair or singulair because they did not seem to help with coughing at night. She reports coughing still occurs primarily at night and she is taking omeprazole BID. Pt to discuss at next viist or earlier if needed.

## 2019-06-05 ENCOUNTER — Other Ambulatory Visit: Payer: Self-pay | Admitting: Family Medicine

## 2019-06-05 DIAGNOSIS — M5136 Other intervertebral disc degeneration, lumbar region: Secondary | ICD-10-CM

## 2019-06-05 DIAGNOSIS — I10 Essential (primary) hypertension: Secondary | ICD-10-CM

## 2019-06-05 DIAGNOSIS — M170 Bilateral primary osteoarthritis of knee: Secondary | ICD-10-CM

## 2019-06-05 DIAGNOSIS — F330 Major depressive disorder, recurrent, mild: Secondary | ICD-10-CM

## 2019-06-05 NOTE — Telephone Encounter (Signed)
Requested Prescriptions  Pending Prescriptions Disp Refills  . DULoxetine (CYMBALTA) 60 MG capsule [Pharmacy Med Name: DULoxetine HCl 60 MG Oral Capsule Delayed Release Particles] 90 capsule 0    Sig: Take 1 capsule by mouth once daily     Psychiatry: Antidepressants - SNRI Passed - 06/05/2019  9:50 AM      Passed - Completed PHQ-2 or PHQ-9 in the last 360 days.      Passed - Last BP in normal range    BP Readings from Last 1 Encounters:  04/11/19 120/76         Passed - Valid encounter within last 6 months    Recent Outpatient Visits          1 month ago Controlled type 2 diabetes with neuropathy [facility])   [facility] [place], Drue Stager, MD   6 months ago MDD (major depressive disorder), recurrent episode, mild ([place])   [facility] Steele Sizer, MD   6 months ago Dyspepsia   [facility] [place], Drue Stager, MD   9 months ago Controlled type 2 diabetes with neuropathy [facility])   [facility] Steele Sizer, MD   10 months ago Essential hypertension   [facility] [place], Drue Stager, MD      Future Appointments            In 2 months Steele Sizer, MD [facility], [place]   In 11 months  [facility]           . ezetimibe (ZETIA) 10 MG tablet [Pharmacy Med Name: Ezetimibe 10 MG Oral Tablet] 90 tablet 0    Sig: Take 1 tablet by mouth once daily     Cardiovascular:  Antilipid - Sterol Transport Inhibitors Failed - 06/05/2019  9:50 AM      Failed - Total Cholesterol in normal range and within 360 days    Cholesterol, Total  Date Value Ref Range Status  09/11/2014 161 100 - 199 mg/dL Final   Cholesterol  Date Value Ref Range Status  09/04/2018 230 (H) <200 mg/dL Final         Failed - LDL in normal range and within 360 days    LDL Cholesterol (Calc)  Date Value Ref Range Status  09/04/2018 157 (H) mg/dL (calc) Final    Comment:    Reference  range: <100 . Desirable range <100 mg/dL for primary prevention;   <70 mg/dL for patients with CHD or diabetic patients  with > or = 2 CHD risk factors. Marland Kitchen LDL-C is now calculated using the Martin-Hopkins  calculation, which is a validated novel method providing  better accuracy than the Friedewald equation in the  estimation of LDL-C.  Cresenciano Genre et al. Annamaria Helling. [id number]): 2061-2068  (http://education.[employer].com/faq/FAQ164)          Failed - HDL in normal range and within 360 days    HDL  Date Value Ref Range Status  09/04/2018 43 (L) > OR = 50 mg/dL Final  09/11/2014 45 >39 mg/dL Final    Comment:    According to ATP-III Guidelines, HDL-C >59 mg/dL is considered a negative risk factor for CHD.          Failed - Triglycerides in normal range and within 360 days    Triglycerides  Date Value Ref Range Status  09/04/2018 165 (H) <150 mg/dL Final         Passed - Valid encounter within last 12 months  Recent Outpatient Visits          1 month ago Controlled type 2 diabetes with neuropathy [facility])   [facility] [place], Drue Stager, MD   6 months ago MDD (major depressive disorder), recurrent episode, mild [facility])   [facility] Steele Sizer, MD   6 months ago Dyspepsia   [facility] [place], Drue Stager, MD   9 months ago Controlled type 2 diabetes with neuropathy [facility])   [facility] Steele Sizer, MD   10 months ago Essential hypertension   [facility] Sizer, MD      Future Appointments            In 2 months Ancil Boozer, Drue Stager, MD [facility], [place]   In 11 months  [facility], [place]           . lisinopril (ZESTRIL) 20 MG tablet [Pharmacy Med Name: Lisinopril 20 MG Oral Tablet] 90 tablet 0    Sig: Take 1 tablet by mouth once daily     Cardiovascular:  ACE Inhibitors Failed - 06/05/2019  9:50 AM      Failed - Cr in  normal range and within 180 days    Creat  Date Value Ref Range Status  09/04/2018 0.97 0.50 - 0.99 mg/dL Final    Comment:    For patients >44 years of age, the reference limit for Creatinine is approximately 13% higher for people identified as African-American. .    Creatinine, Urine  Date Value Ref Range Status  12/21/2017 78 20 - 275 mg/dL Final         Failed - K in normal range and within 180 days    Potassium  Date Value Ref Range Status  09/04/2018 4.4 3.5 - 5.3 mmol/L Final         Passed - Patient is not pregnant      Passed - Last BP in normal range    BP Readings from Last 1 Encounters:  04/11/19 120/76         Passed - Valid encounter within last 6 months    Recent Outpatient Visits          1 month ago Controlled type 2 diabetes with neuropathy [facility])   [facility] [place], Drue Stager, MD   6 months ago MDD (major depressive disorder), recurrent episode, mild [facility])   [facility] Steele Sizer, MD   6 months ago Dyspepsia   [facility] [place], Drue Stager, MD   9 months ago Controlled type 2 diabetes with neuropathy [facility])   [facility] Sizer, MD   10 months ago Essential hypertension   [facility] [place], Drue Stager, MD      Future Appointments            In 2 months Steele Sizer, MD [facility], [place]   In 11 months  [facility], [facility]           . meloxicam (MOBIC) 15 MG tablet [Pharmacy Med Name: Meloxicam 15 MG Oral Tablet] 90 tablet 0    Sig: Take 1 tablet by mouth once daily     Analgesics:  COX2 Inhibitors Failed - 06/05/2019  9:50 AM      Failed - HGB in normal range and within 360 days    Hemoglobin  Date Value Ref Range Status  02/27/2018 13.1 11.7 - 15.5 g/dL  Final         Passed - Cr in normal range and within 360 days    Creat  Date Value Ref Range Status  09/04/2018 0.97 0.50 - 0.99 mg/dL  Final    Comment:    For patients >52 years of age, the reference limit for Creatinine is approximately 13% higher for people identified as African-American. .    Creatinine, Urine  Date Value Ref Range Status  12/21/2017 78 20 - 275 mg/dL Final         Passed - Patient is not pregnant      Passed - Valid encounter within last 12 months    Recent Outpatient Visits          1 month ago Controlled type 2 diabetes with neuropathy [facility])   [facility] [place], Drue Stager, MD   6 months ago MDD (major depressive disorder), recurrent episode, mild [facility])   [facility] Steele Sizer, MD   6 months ago Dyspepsia   [facility] [place], Drue Stager, MD   9 months ago Controlled type 2 diabetes with neuropathy [facility])   [facility] Sizer, MD   10 months ago Essential hypertension   [facility]izer, MD      Future Appointments            In 2 months Ancil Boozer, Drue Stager, MD [facility], [place]   In 11 months  [facility], [place]           . baclofen (LIORESAL) 10 MG tablet [Pharmacy Med Name: Baclofen 10 MG Oral Tablet] 180 tablet 0    Sig: TAKE 1 TO 2 TABLETS BY MOUTH TWICE DAILY     Not Delegated - Analgesics:  Muscle Relaxants Failed - 06/05/2019  9:50 AM      Failed - This refill cannot be delegated      Passed - Valid encounter within last 6 months    Recent Outpatient Visits          1 month ago Controlled type 2 diabetes with neuropathy [facility])   [facility] Sizer, MD   6 months ago MDD (major depressive disorder), recurrent episode, mild [facility])   [facility]izer, MD   6 months ago Dyspepsia   [facility] [place], Drue Stager, MD   9 months ago Controlled type 2 diabetes with neuropathy [facility])   [facility] Sizer, MD   10  months ago Essential hypertension   [facility] Sizer, MD      Future Appointments            In 2 months Ancil Boozer, Drue Stager, MD [facility], [place]   In 11 months  [facility], [facility]

## 2019-06-05 NOTE — Telephone Encounter (Signed)
Requested medication (s) are due for refill today: yes  Requested medication (s) are on the active medication list: yes  Last refill:  03/01/2019  Future visit scheduled: yes  Notes to clinic:  not delegated   Requested Prescriptions  Pending Prescriptions Disp Refills   baclofen (LIORESAL) 10 MG tablet [Pharmacy Med Name: Baclofen 10 MG Oral Tablet] 180 tablet 0    Sig: TAKE 1 TO 2 TABLETS BY MOUTH TWICE DAILY      Not Delegated - Analgesics:  Muscle Relaxants Failed - 06/05/2019  9:50 AM      Failed - This refill cannot be delegated      Passed - Valid encounter within last 6 months    Recent Outpatient Visits           1 month ago Controlled type 2 diabetes with neuropathy [facility])   [facility] Steele Sizer, MD   6 months ago MDD (major depressive disorder), recurrent episode, mild [facility])   [facility] Steele Sizer, MD   6 months ago Dyspepsia   [facility] [place], Drue Stager, MD   9 months ago Controlled type 2 diabetes with neuropathy [facility])   [facility] Sizer, MD   10 months ago Essential hypertension   [facility] Steele Sizer, MD       Future Appointments             In 2 months Steele Sizer, MD [facility], [place]   In 11 months  [facility]             Signed Prescriptions Disp Refills   DULoxetine (CYMBALTA) 60 MG capsule 90 capsule 0    Sig: Take 1 capsule by mouth once daily      Psychiatry: Antidepressants - SNRI Passed - 06/05/2019  9:50 AM      Passed - Completed PHQ-2 or PHQ-9 in the last 360 days.      Passed - Last BP in normal range    BP Readings from Last 1 Encounters:  04/11/19 120/76          Passed - Valid encounter within last 6 months    Recent Outpatient Visits           1 month ago Controlled type 2 diabetes with neuropathy [facility])   [facility]  [place], Drue Stager, MD   6 months ago MDD (major depressive disorder), recurrent episode, mild [facility])   [facility] Sizer, MD   6 months ago Dyspepsia   [facility] [place], Drue Stager, MD   9 months ago Controlled type 2 diabetes with neuropathy [facility])   [facility] Steele Sizer, MD   10 months ago Essential hypertension   [facility] [place], Drue Stager, MD       Future Appointments             In 2 months Steele Sizer, MD [facility], [place]   In 11 months  [facility]              ezetimibe (ZETIA) 10 MG tablet 90 tablet 0    Sig: Take 1 tablet by mouth once daily      Cardiovascular:  Antilipid - Sterol Transport Inhibitors Failed - 06/05/2019  9:50 AM      Failed - Total Cholesterol in normal range and within 360 days  Cholesterol, Total  Date Value Ref Range Status  09/11/2014 161 100 - 199 mg/dL Final   Cholesterol  Date Value Ref Range Status  09/04/2018 230 (H) <200 mg/dL Final          Failed - LDL in normal range and within 360 days    LDL Cholesterol (Calc)  Date Value Ref Range Status  09/04/2018 157 (H) mg/dL (calc) Final    Comment:    Reference range: <100 . Desirable range <100 mg/dL for primary prevention;   <70 mg/dL for patients with CHD or diabetic patients  with > or = 2 CHD risk factors. Marland Kitchen LDL-C is now calculated using the Martin-Hopkins  calculation, which is a validated novel method providing  better accuracy than the Friedewald equation in the  estimation of LDL-C.  Cresenciano Genre et al. Annamaria Helling. [id number]): 2061-2068  (http://education.[employer].com/faq/FAQ164)           Failed - HDL in normal range and within 360 days    HDL  Date Value Ref Range Status  09/04/2018 43 (L) > OR = 50 mg/dL Final  09/11/2014 45 >39 mg/dL Final    Comment:    According to ATP-III Guidelines, HDL-C >59 mg/dL is  considered a negative risk factor for CHD.           Failed - Triglycerides in normal range and within 360 days    Triglycerides  Date Value Ref Range Status  09/04/2018 165 (H) <150 mg/dL Final          Passed - Valid encounter within last 12 months    Recent Outpatient Visits           1 month ago Controlled type 2 diabetes with neuropathy [facility])   [facility] [place], Drue Stager, MD   6 months ago MDD (major depressive disorder), recurrent episode, mild [facility])   [facility]izer, MD   6 months ago Dyspepsia   [facility] [place], Drue Stager, MD   9 months ago Controlled type 2 diabetes with neuropathy [facility])   [facility] [place], Drue Stager, MD   10 months ago Essential hypertension   [facility] [place], Drue Stager, MD       Future Appointments             In 2 months Steele Sizer, MD [facility], [place]   In 11 months  [facility]              lisinopril (ZESTRIL) 20 MG tablet 90 tablet 0    Sig: Take 1 tablet by mouth once daily      Cardiovascular:  ACE Inhibitors Failed - 06/05/2019  9:50 AM      Failed - Cr in normal range and within 180 days    Creat  Date Value Ref Range Status  09/04/2018 0.97 0.50 - 0.99 mg/dL Final    Comment:    For patients >76 years of age, the reference limit for Creatinine is approximately 13% higher for people identified as African-American. .    Creatinine, Urine  Date Value Ref Range Status  12/21/2017 78 20 - 275 mg/dL Final          Failed - K in normal range and within 180 days    Potassium  Date Value Ref Range Status  09/04/2018 4.4 3.5 - 5.3 mmol/L Final          Passed - Patient is not  pregnant      Passed - Last BP in normal range    BP Readings from Last 1 Encounters:  04/11/19 120/76          Passed - Valid encounter within last 6 months    Recent  Outpatient Visits           1 month ago Controlled type 2 diabetes with neuropathy [facility])   [facility]izer, MD   6 months ago MDD (major depressive disorder), recurrent episode, mild [facility])   [facility] [place], Drue Stager, MD   6 months ago Dyspepsia   [facility] Sizer, MD   9 months ago Controlled type 2 diabetes with neuropathy [facility])   [facility]izer, MD   10 months ago Essential hypertension   [facility] Sizer, MD       Future Appointments             In 2 months [facility] Boozer, Drue Stager, MD [place]   In 11 months  [facility]              meloxicam (MOBIC) 15 MG tablet 90 tablet 0    Sig: Take 1 tablet by mouth once daily      Analgesics:  COX2 Inhibitors Failed - 06/05/2019  9:50 AM      Failed - HGB in normal range and within 360 days    Hemoglobin  Date Value Ref Range Status  02/27/2018 13.1 11.7 - 15.5 g/dL Final          Passed - Cr in normal range and within 360 days    Creat  Date Value Ref Range Status  09/04/2018 0.97 0.50 - 0.99 mg/dL Final    Comment:    For patients >7 years of age, the reference limit for Creatinine is approximately 13% higher for people identified as African-American. .    Creatinine, Urine  Date Value Ref Range Status  12/21/2017 78 20 - 275 mg/dL Final          Passed - Patient is not pregnant      Passed - Valid encounter within last 12 months    Recent Outpatient Visits           1 month ago Controlled type 2 diabetes with neuropathy [facility])   [facility] [place], Drue Stager, MD   6 months ago MDD (major depressive disorder), recurrent episode, mild [facility])   [facility]izer, MD   6 months ago Dyspepsia   [facility] [place], Drue Stager, MD   9 months ago Controlled  type 2 diabetes with neuropathy [facility])   [facility]izer, MD   10 months ago Essential hypertension   [facility]izer, MD       Future Appointments             In 2 months Ancil Boozer, Drue Stager, MD [facility], [place]   In 11 months  [facility], [facility]

## 2019-07-03 ENCOUNTER — Other Ambulatory Visit: Payer: Self-pay | Admitting: Family Medicine

## 2019-07-03 DIAGNOSIS — E038 Other specified hypothyroidism: Secondary | ICD-10-CM

## 2019-07-22 ENCOUNTER — Ambulatory Visit (INDEPENDENT_AMBULATORY_CARE_PROVIDER_SITE_OTHER)
Admission: RE | Admit: 2019-07-22 | Discharge: 2019-07-22 | Disposition: A | Discharge status: Home / Self Care | Payer: Medicare Other | Source: Ambulatory Visit

## 2019-07-22 DIAGNOSIS — R21 Rash and other nonspecific skin eruption: Secondary | ICD-10-CM | POA: Diagnosis not present

## 2019-07-22 MED ORDER — PREDNISONE 20 MG PO TABS: 20.0000 mg | ORAL_TABLET | Freq: Every day | ORAL | 0 refills | Status: AC

## 2019-07-22 NOTE — ED Provider Notes (Signed)
[place]     Virtual Visit via Video Note:  [name]  initiated request for Telemedicine visit with [facility] Urgent Care team. I connected with [name]  on 07/22/2019 at 11:51 AM  for a synchronized telemedicine visit using a video enabled HIPPA compliant telemedicine application. I verified that I am speaking with [name]  using two identifiers. Lestine Box, PA-C  was physically located in a [facility] Urgent care site and ROSANNA BLANKLEY was located at a different location.   The limitations of evaluation and management by telemedicine as well as the availability of in-person appointments were discussed. Patient was informed that she  may incur a bill ( including co-pay) for this virtual visit encounter. [name]  expressed understanding and gave verbal consent to proceed with virtual visit.   [id number] 07/22/19 Arrival Time: 1140  CC: SKIN COMPLAINT  SUBJECTIVE:  Sarah Gordon is a 69 y.o. female who presents with a rash x 4 days ago.  Denies precipitating event or trauma.  Denies changes in soaps, detergents, close contacts with similar rash, environmental trigger, or allergy. Denies medications change or starting a new medication recently.  Localizes the rash to left arm.  Describes it as red, mild itching, and intermittent pain.  Spreading.  Has tried triamcinolone with minimal relief.  Symptoms are made worse to the touch.  Denies similar symptoms in the past.   Denies fever, chills, nausea, vomiting, swelling, discharge, oral lesions, genital lesions, SOB, chest pain, abdominal pain, changes in bowel or bladder function.    ROS: As per HPI.  All other pertinent ROS negative.     Past Medical History:  Diagnosis Date  . Allergy   . Asthma   . Cataract   . Esophageal reflux   . Gouty arthropathy, unspecified   . Herpes simplex without mention of complication   . Irritable bowel syndrome   . Localized osteoarthrosis not specified whether  primary or secondary, unspecified site   . Migraine, unspecified, without mention of intractable migraine without mention of status migrainosus   . Mixed hyperlipidemia   . Myalgia and myositis, unspecified   . Obstructive sleep apnea (adult) (pediatric)   . Palpitations   . Type II or unspecified type diabetes mellitus without mention of complication, not stated as uncontrolled   . Umbilical hernia without mention of obstruction or gangrene   . Unspecified essential hypertension   . Unspecified hypothyroidism    Past Surgical History:  Procedure Laterality Date  . BREAST BIOPSY Left    benign  . COLONOSCOPY     Dr Alveta Heimlich  . COLONOSCOPY WITH PROPOFOL N/A 11/19/2015   Procedure: COLONOSCOPY WITH PROPOFOL;  Surgeon: Christene Lye, MD;  Location: [facility] ENDOSCOPY;  Service: Endoscopy;  Laterality: N/A;  . ESOPHAGOGASTRODUODENOSCOPY N/A 08/20/2014   Procedure: ESOPHAGOGASTRODUODENOSCOPY (EGD);  Surgeon: Christene Lye, MD;  Location: [facility] ENDOSCOPY;  Service: Endoscopy;  Laterality: N/A;  . FOOT SURGERY Left    heel spur  . HERNIA REPAIR     umbilical  . NASAL SINUS SURGERY  1980s  . UMBILICAL HERNIA REPAIR     Allergies  Allergen Reactions  . Aspir-81 [Aspirin] Other (See Comments)    Coagulation disorder  . Lipitor [Atorvastatin] Other (See Comments)    Joint pain  . Statins Other (See Comments)  . Topamax [Topiramate] Other (See Comments)    Cognitive issues   . Vascepa [Icosapent Ethyl] Swelling and Other (See Comments)  .  Voltaren [Diclofenac Sodium] Hives and Other (See Comments)   No current facility-administered medications on file prior to encounter.   Current Outpatient Medications on File Prior to Encounter  Medication Sig Dispense Refill  . baclofen (LIORESAL) 10 MG tablet TAKE 1 TO 2 TABLETS BY MOUTH TWICE DAILY 180 tablet 0  . DULoxetine (CYMBALTA) 60 MG capsule Take 1 capsule by mouth once daily 90 capsule 0  . ezetimibe (ZETIA) 10 MG tablet Take 1  tablet by mouth once daily 90 tablet 0  . levothyroxine (SYNTHROID) 100 MCG tablet Take 1 tablet by mouth once daily 90 tablet 0  . lisinopril (ZESTRIL) 20 MG tablet Take 1 tablet by mouth once daily 90 tablet 0  . LORATADINE ALLERGY RELIEF PO     . magnesium gluconate (MAGONATE) 500 MG tablet Take 500 mg by mouth daily.    . meloxicam (MOBIC) 15 MG tablet Take 1 tablet by mouth once daily 90 tablet 0  . Multiple Vitamins-Minerals (HAIR SKIN NAILS PO) Take 1 tablet by mouth daily.    Marland Kitchen omeprazole (PRILOSEC) 20 MG capsule Take 1 capsule by mouth once daily 90 capsule 0  . OVER THE COUNTER MEDICATION Take 1 capsule by mouth 2 (two) times daily. digestzen    . [DISCONTINUED] albuterol (PROVENTIL HFA;VENTOLIN HFA) 108 (90 Base) MCG/ACT inhaler Inhale 2 puffs into the lungs every 6 (six) hours as needed for wheezing or shortness of breath. (Patient not taking: Reported on 05/24/2019) 1 Inhaler 2  . [DISCONTINUED] Fluticasone-Salmeterol (ADVAIR DISKUS) 250-50 MCG/DOSE AEPB Inhale 1 puff into the lungs 2 (two) times daily. (Patient not taking: Reported on 05/24/2019) 60 each 3  . [DISCONTINUED] montelukast (SINGULAIR) 10 MG tablet Take 1 tablet (10 mg total) by mouth at bedtime. (Patient not taking: Reported on 05/24/2019) 90 tablet 1    OBJECTIVE: There were no vitals filed for this visit.  General appearance: alert; no distress Eyes: EOMI grossly HENT: normocephalic; atraumatic Neck: supple with FROM Lungs: normal respiratory effort; speaking in full sentences without difficulty Extremities: moves extremities without difficulty Skin: 2-3 erythematous macule/papules localized to LT upper anterior arm, blanches with pressure, no obvious drainage or bleeding Neurologic: normal facial expressions Psychological: alert and cooperative; normal mood and affect  ASSESSMENT & PLAN:  1. Rash and nonspecific skin eruption     Meds ordered this encounter  Medications  . predniSONE (DELTASONE) 20 MG tablet      Sig: Take 1 tablet (20 mg total) by mouth daily with breakfast for 5 days.    Dispense:  5 tablet    Refill:  0    Order Specific Question:   Supervising Provider    Answer:   Raylene Everts [id number]    Prednisone prescribed.  Take as directed and to completion Continue with triamcinolone cream Take OTC zyrtec/ allegra/ benadryl as needed Avoid hot showers/ baths Moisturize skin daily  Follow up with PCP or in person if symptoms persists Follow up in person or go to the ER if you have any new or worsening symptoms such as fever, chills, nausea, vomiting, redness, swelling, discharge, if symptoms do not improve with medications, etc...  I discussed the assessment and treatment plan with the patient. The patient was provided an opportunity to ask questions and all were answered. The patient agreed with the plan and demonstrated an understanding of the instructions.   The patient was advised to call back or seek an in-person evaluation if the symptoms worsen or if the condition fails to improve  as anticipated.  I provided 11 minutes of non-face-to-face time during this encounter.  Lestine Box, PA-C  07/22/2019 11:51 AM    Lestine Box, PA-C 07/22/19 1153

## 2019-07-22 NOTE — Discharge Instructions (Signed)
Prednisone prescribed.  Take as directed and to completion Continue with triamcinolone cream Take OTC zyrtec/ allegra/ benadryl as needed Avoid hot showers/ baths Moisturize skin daily  Follow up with PCP or in person if symptoms persists Follow up in person or go to the ER if you have any new or worsening symptoms such as fever, chills, nausea, vomiting, redness, swelling, discharge, if symptoms do not improve with medications, etc..Marland Kitchen

## 2019-07-24 ENCOUNTER — Other Ambulatory Visit: Payer: Self-pay

## 2019-07-24 ENCOUNTER — Encounter: Payer: Self-pay | Admitting: Family Medicine

## 2019-07-24 ENCOUNTER — Telehealth (INDEPENDENT_AMBULATORY_CARE_PROVIDER_SITE_OTHER): Payer: Medicare Other | Admitting: Family Medicine

## 2019-07-24 VITALS — BP 150/90 | HR 88 | Temp 97.9°F | Ht 66.0 in | Wt 270.0 lb

## 2019-07-24 DIAGNOSIS — R21 Rash and other nonspecific skin eruption: Secondary | ICD-10-CM | POA: Diagnosis not present

## 2019-07-24 MED ORDER — METHYLPREDNISOLONE 4 MG PO TBPK: ORAL_TABLET | ORAL | 0 refills | Status: DC

## 2019-07-24 NOTE — Progress Notes (Signed)
Name: Sarah Gordon   MRN: [id number]    DOB: 1950-07-03   Date:07/24/2019       Progress Note  Subjective:    Chief Complaint  Chief Complaint  Patient presents with  . Rash    onset 6 days, pt thinks it may be shingles.  red blister like.  On back of neck, right ear andupper left arm    I connected with  [name]  on 07/24/19 at 10:40 AM EDT by a video enabled telemedicine application and verified that I am speaking with the correct person using two identifiers.  I discussed the limitations of evaluation and management by telemedicine and the availability of in person appointments. The patient expressed understanding and agreed to proceed. Staff also discussed with the patient that there may be a patient responsible charge related to this service. Patient Location: home Provider Location: [facility] clinic  Additional Individuals present: none  HPI She has a red rash that started on left shoulder and upper arm develop about 6 days ago, spread down left arm and also had some spread to behind right arm. It is red, scattered, burns and itches, pain radiates down arm to places where rash is not  She pulled weeds the other day, which is something that she commonly does and there is nothing different that she noticed coming into contact with.  She has had no new medications, foods, soaps detergents.  No one else in the home is ill or has any similar rash or symptoms.  She is on day 3/5 of prednisone burst on 20 mg dose that she got from UC - no improvement at all in rash, she is sensitive to prednisone and steroids, cannot tolerate higher doses it makes her very agitated and mean she states.  She is using a prescribed triamcinolone ointment on rash she was told by the PA at the urgent care that she was able to do this since her husband's medication  She has not noticed any improvement she has a linear raised line of red bumps across her forehead and a curved line she is got patches all the way  down her left arm and has some behind her right ear she has not had it spread anywhere else in the past couple days but the pain itching and burning is severe and keeping her up at night.  She denies any swelling of her lips any sensation of airway closure or swelling in the back of her throat no wheeze or stridor no swelling around her eyes.  No one else in the home has any similar rash.    Patient Active Problem List   Diagnosis Date Noted  . Ventricular flutter ([place]) 12/06/2018  . MDD (major depressive disorder), recurrent episode, mild ([place]) 09/04/2018  . DDD (degenerative disc disease), cervical 01/23/2016  . Degenerative joint disease of left acromioclavicular joint 01/23/2016  . Recurrent oral herpes simplex infection 09/01/2015  . Irritable bowel syndrome with diarrhea 08/07/2015  . Statin intolerance 02/28/2015  . Asthma, intermittent 09/10/2014  . Controlled type 2 diabetes with neuropathy ([place]) 09/10/2014  . Fibromyalgia 09/10/2014  . Obstructive sleep apnea 09/10/2014  . Hyperlipidemia 12/29/2012  . Essential hypertension 12/29/2012  . Morbid obesity ([place]) 12/29/2012  . Arthritis due to gout 12/13/2007  . Acid reflux 10/24/2006  . Adult hypothyroidism 10/24/2006  . Localized osteoarthrosis 10/24/2006    Social History   Tobacco Use  . Smoking status: Never Smoker  . Smokeless tobacco: Never Used  Substance Use Topics  . Alcohol use: No    Alcohol/week: 0.0 standard drinks    Comment: RARELY     Current Outpatient Medications:  .  baclofen (LIORESAL) 10 MG tablet, TAKE 1 TO 2 TABLETS BY MOUTH TWICE DAILY, Disp: 180 tablet, Rfl: 0 .  DULoxetine (CYMBALTA) 60 MG capsule, Take 1 capsule by mouth once daily, Disp: 90 capsule, Rfl: 0 .  ezetimibe (ZETIA) 10 MG tablet, Take 1 tablet by mouth once daily, Disp: 90 tablet, Rfl: 0 .  levothyroxine (SYNTHROID) 100 MCG tablet, Take 1 tablet by mouth once daily, Disp: 90 tablet, Rfl: 0 .  lisinopril (ZESTRIL) 20 MG tablet,  Take 1 tablet by mouth once daily, Disp: 90 tablet, Rfl: 0 .  LORATADINE ALLERGY RELIEF PO, , Disp: , Rfl:  .  meloxicam (MOBIC) 15 MG tablet, Take 1 tablet by mouth once daily, Disp: 90 tablet, Rfl: 0 .  Multiple Vitamins-Minerals (HAIR SKIN NAILS PO), Take 1 tablet by mouth daily., Disp: , Rfl:  .  omeprazole (PRILOSEC) 20 MG capsule, Take 1 capsule by mouth once daily, Disp: 90 capsule, Rfl: 0 .  predniSONE (DELTASONE) 20 MG tablet, Take 1 tablet (20 mg total) by mouth daily with breakfast for 5 days., Disp: 5 tablet, Rfl: 0  Allergies  Allergen Reactions  . Aspir-81 [Aspirin] Other (See Comments)    Coagulation disorder  . Lipitor [Atorvastatin] Other (See Comments)    Joint pain  . Statins Other (See Comments)  . Topamax [Topiramate] Other (See Comments)    Cognitive issues   . Vascepa [Icosapent Ethyl] Swelling and Other (See Comments)  . Voltaren [Diclofenac Sodium] Hives and Other (See Comments)    I personally reviewed active problem list, medication list, allergies, family history, social history, health maintenance, notes from last encounter, lab results, imaging with the patient/caregiver today.   Review of Systems  10 Systems reviewed and are negative for acute change except as noted in the HPI.   Objective:   Virtual encounter, vitals limited, only able to obtain the following Today's Vitals   07/24/19 0959  BP: (!) 150/90  Pulse: 88  Temp: 97.9 F (36.6 C)  Weight: 270 lb (122.5 kg)  Height: 5\' 6"  (1.676 m)   Body mass index is 43.58 kg/m. Nursing Note and Vital Signs reviewed.  Physical Exam Vitals and nursing note reviewed.  Constitutional:      General: She is not in acute distress.    Appearance: Normal appearance. She is well-developed. She is not ill-appearing, toxic-appearing or diaphoretic.  HENT:     Head: Normocephalic and atraumatic. No raccoon eyes, right periorbital erythema or left periorbital erythema.     Jaw: There is normal jaw  occlusion.     Comments: Curved line across her upper forehead in a linear distribution of erythematous papules    Ears:     Comments: Erythematous rash behind her right ear unable to see further detail Eyes:     General:        Right eye: No discharge.        Left eye: No discharge.     Conjunctiva/sclera: Conjunctivae normal.  Neck:     Trachea: No tracheal deviation.  Cardiovascular:     Rate and Rhythm: Normal rate.  Pulmonary:     Effort: Pulmonary effort is normal. No respiratory distress.     Breath sounds: No stridor.  Skin:    Coloration: Skin is not jaundiced or pale.     Findings: Rash  present.     Comments: Erythematous maculopapular rash scattered to left upper arm to forearm, no vesicles no crusting  Neurological:     Mental Status: She is alert.  Psychiatric:        Mood and Affect: Mood normal.        Behavior: Behavior normal.     PE limited by telephone encounter  No results found for this or any previous visit (from the past 72 hour(s)).  Assessment and Plan:   1. Rash and nonspecific skin eruption Onset of red raised itchy and burning rash about 6 days ago, appears most consistent with a contact dermatitis she was pulling weeds she likely came into contact with some poison oak or poison ivy.  She did start a lower dose prednisone burst about 3 days ago but has not had any improvement.  Encouraged her to stop prednisone and start Medrol dose pack also start daily Benadryl half a tablet to a full tablet up to 3-4 times a day also instructed her to add Pepcid 20 mg twice daily for the next 3 to 5 days He is using topical triamcinolone ointment that is her husband's encouraged her to avoid applying to her face but that she could use cortisone 10 to the rash on her face and may also try cold compresses or Benadryl topical cream - methylPREDNISolone (MEDROL DOSEPAK) 4 MG TBPK tablet; Take taper as directed on pack  Dispense: 21 tablet; Refill: 0  Patient is going  out of town in 2 weeks to a wedding and she is concerned that it might not clear up but she is not done well with steroids in the past we did discuss the options today of doing a longer taper to avoid rebound dermatitis but since she has difficult to tolerate side effects to steroids we will try a shorter treatment now with adding antihistamines and Pepcid, she has little time to follow-up next week if she has any remaining rash or any rebound dermatitis.  We discussed concerning signs and symptoms which should warrant immediate follow-up including swelling to her lips or face, difficulty breathing with wheeze or stridor, patient verbalized understanding of ER precautions and follow-up plan  -Red flags and when to present for emergency care or RTC including fever >101.44F, chest pain, shortness of breath, new/worsening/un-resolving symptoms, reviewed with patient at time of visit. Follow up and care instructions discussed and provided in AVS. - I discussed the assessment and treatment plan with the patient. The patient was provided an opportunity to ask questions and all were answered. The patient agreed with the plan and demonstrated an understanding of the instructions.  I provided 30 minutes of non-face-to-face time during this encounter.  Delsa Grana, PA-C 07/24/19 10:43 AM

## 2019-08-29 ENCOUNTER — Ambulatory Visit (INDEPENDENT_AMBULATORY_CARE_PROVIDER_SITE_OTHER): Payer: Medicare Other | Admitting: Family Medicine

## 2019-08-29 ENCOUNTER — Other Ambulatory Visit: Payer: Self-pay

## 2019-08-29 ENCOUNTER — Encounter: Payer: Self-pay | Admitting: Family Medicine

## 2019-08-29 VITALS — BP 142/88 | HR 116 | Temp 97.8°F | Resp 20 | Ht 66.0 in | Wt 269.3 lb

## 2019-08-29 DIAGNOSIS — M5136 Other intervertebral disc degeneration, lumbar region: Secondary | ICD-10-CM | POA: Diagnosis not present

## 2019-08-29 DIAGNOSIS — J4520 Mild intermittent asthma, uncomplicated: Secondary | ICD-10-CM

## 2019-08-29 DIAGNOSIS — E1169 Type 2 diabetes mellitus with other specified complication: Secondary | ICD-10-CM | POA: Diagnosis not present

## 2019-08-29 DIAGNOSIS — I4902 Ventricular flutter: Secondary | ICD-10-CM

## 2019-08-29 DIAGNOSIS — Z91018 Allergy to other foods: Secondary | ICD-10-CM

## 2019-08-29 DIAGNOSIS — E038 Other specified hypothyroidism: Secondary | ICD-10-CM

## 2019-08-29 DIAGNOSIS — E1140 Type 2 diabetes mellitus with diabetic neuropathy, unspecified: Secondary | ICD-10-CM

## 2019-08-29 DIAGNOSIS — Z79899 Other long term (current) drug therapy: Secondary | ICD-10-CM | POA: Diagnosis not present

## 2019-08-29 DIAGNOSIS — K219 Gastro-esophageal reflux disease without esophagitis: Secondary | ICD-10-CM

## 2019-08-29 DIAGNOSIS — T466X5A Adverse effect of antihyperlipidemic and antiarteriosclerotic drugs, initial encounter: Secondary | ICD-10-CM

## 2019-08-29 DIAGNOSIS — I10 Essential (primary) hypertension: Secondary | ICD-10-CM

## 2019-08-29 DIAGNOSIS — M170 Bilateral primary osteoarthritis of knee: Secondary | ICD-10-CM

## 2019-08-29 DIAGNOSIS — M791 Myalgia, unspecified site: Secondary | ICD-10-CM

## 2019-08-29 DIAGNOSIS — G4733 Obstructive sleep apnea (adult) (pediatric): Secondary | ICD-10-CM | POA: Diagnosis not present

## 2019-08-29 DIAGNOSIS — F3340 Major depressive disorder, recurrent, in remission, unspecified: Secondary | ICD-10-CM

## 2019-08-29 DIAGNOSIS — J392 Other diseases of pharynx: Secondary | ICD-10-CM

## 2019-08-29 DIAGNOSIS — E785 Hyperlipidemia, unspecified: Secondary | ICD-10-CM

## 2019-08-29 LAB — POCT GLYCOSYLATED HEMOGLOBIN (HGB A1C): Hemoglobin A1C: 5.8 % — AB (ref 4.0–5.6)

## 2019-08-29 MED ORDER — EZETIMIBE 10 MG PO TABS: 10.0000 mg | ORAL_TABLET | Freq: Every day | ORAL | 1 refills | Status: DC

## 2019-08-29 MED ORDER — BACLOFEN 10 MG PO TABS: 10.0000 mg | ORAL_TABLET | Freq: Two times a day (BID) | ORAL | 1 refills | Status: DC

## 2019-08-29 MED ORDER — MELOXICAM 15 MG PO TABS: 15.0000 mg | ORAL_TABLET | Freq: Every day | ORAL | 1 refills | Status: DC

## 2019-08-29 MED ORDER — OMEPRAZOLE 20 MG PO CPDR: 20.0000 mg | DELAYED_RELEASE_CAPSULE | Freq: Every day | ORAL | 1 refills | Status: DC

## 2019-08-29 MED ORDER — METOPROLOL SUCCINATE ER 25 MG PO TB24: 25.0000 mg | ORAL_TABLET | Freq: Every day | ORAL | 1 refills | Status: DC

## 2019-08-29 MED ORDER — LISINOPRIL 20 MG PO TABS: 20.0000 mg | ORAL_TABLET | Freq: Every day | ORAL | 1 refills | Status: DC

## 2019-08-29 MED ORDER — DULOXETINE HCL 30 MG PO CPEP: 30.0000 mg | ORAL_CAPSULE | Freq: Every day | ORAL | 1 refills | Status: DC

## 2019-08-29 MED ORDER — DULOXETINE HCL 60 MG PO CPEP: 60.0000 mg | ORAL_CAPSULE | Freq: Every day | ORAL | 1 refills | Status: DC

## 2019-08-29 NOTE — Progress Notes (Signed)
Name: Sarah Gordon   MRN: [id number]    DOB: Nov 29, 1950   Date:08/29/2019       Progress Note  Subjective  Chief Complaint  Chief Complaint  Patient presents with  . Cough    accompanied with sensitive area in neck   . Follow-up  . Depression    HPI  Throat irritation: going on for years, described as discomfort on right side when it is something dry that she eats, it causes her to have a dry cough, also bothers her at night. She has a history of asthma, also spoke to GI and was advised to take Omeprazole BID and symptoms improved a little, but is still concerned because it is chronic, recurrent and nobody has ever looked inside. She is willing to see ENT now , she also noticed that when she ate peanuts she had tongue swelling, discussed epipen but she prefers holding off for now.   HTN: bp is elevated today, she is compliant with medication, denies chest pain or palpitation. We will add metoprolol since heart rate is also high  Ventricular flutter: seen by cardiologist, medication for thyroid adjusted and feeling well since. She states no recent episodes of palpitation . However heart rate it up again, we will check TSH today and also add metoprolol  MDD: she is feeling better on duloxetine and denies , she is also happy that church has opened up again, phq 9 is normal, however she has noticed that she is numb, could not cry during a sad movie, advised to go down on duloxetine dose through the Summer and resume 60 mg in Nov to take through winter months  Denies suicidal thoughts or ideation. Long history of depression and took medications in the past  DMII: doing well on life style modification. A1C at goal. Denies polyphagia, polydipsia or polyuria She has a history of neuropathy, very mild symptoms, eye exam is up to date. A1C is at goal today, she has gained 10 lbs since last visit and needs to avoid ice cream - her weakness She has dyslipidemia and obesity, taking ezetimide and we will  recheck labs  DDD lumbar spine: seen at [facility] , had MRI done and showed herniated disc, taking nsaids and baclofen , she had steroid injections twice and seems to help, not having daily pain and very seldom radiates down her legs  She states she has been doing well lately .   Asthma: she has a dry cough , triggered by eating but also every night, explained that we need to start her on maintenance medication. No wheezing or SOB. She does not want medications at this time.   Morbid Obesity: BMI above 40, she gained weight since last visit, she states she needs to cut down on eating ice cream   Patient Active Problem List   Diagnosis Date Noted  . Ventricular flutter ([place]) 12/06/2018  . MDD (major depressive disorder), recurrent episode, mild ([place]) 09/04/2018  . DDD (degenerative disc disease), cervical 01/23/2016  . Degenerative joint disease of left acromioclavicular joint 01/23/2016  . Recurrent oral herpes simplex infection 09/01/2015  . Irritable bowel syndrome with diarrhea 08/07/2015  . Statin intolerance 02/28/2015  . Asthma, intermittent 09/10/2014  . Controlled type 2 diabetes with neuropathy ([place]) 09/10/2014  . Fibromyalgia 09/10/2014  . Obstructive sleep apnea 09/10/2014  . Hyperlipidemia 12/29/2012  . Essential hypertension 12/29/2012  . Morbid obesity ([place]) 12/29/2012  . Arthritis due to gout 12/13/2007  . Acid reflux 10/24/2006  .  Adult hypothyroidism 10/24/2006  . Localized osteoarthrosis 10/24/2006    Past Surgical History:  Procedure Laterality Date  . BREAST BIOPSY Left    benign  . COLONOSCOPY     Dr Alveta Heimlich  . COLONOSCOPY WITH PROPOFOL N/A 11/19/2015   Procedure: COLONOSCOPY WITH PROPOFOL;  Surgeon: Christene Lye, MD;  Location: [facility] ENDOSCOPY;  Service: Endoscopy;  Laterality: N/A;  . ESOPHAGOGASTRODUODENOSCOPY N/A 08/20/2014   Procedure: ESOPHAGOGASTRODUODENOSCOPY (EGD);  Surgeon: Christene Lye, MD;  Location: [facility] ENDOSCOPY;  Service:  Endoscopy;  Laterality: N/A;  . FOOT SURGERY Left    heel spur  . HERNIA REPAIR     umbilical  . NASAL SINUS SURGERY  1980s  . UMBILICAL HERNIA REPAIR      Family History  Problem Relation Age of Onset  . Hypertension Mother   . Hyperlipidemia Mother   . Diabetes Mother   . Heart disease Mother   . Stroke Mother   . Heart disease Brother 77       CABG x 3   . Heart attack Brother 10  . Lupus Sister   . Arthritis Sister   . Asthma Sister   . Depression Sister   . Diabetes Brother   . Hypertension Brother   . Obesity Brother   . Early death Brother   . Miscarriages / Stillbirths Sister   . Stroke Maternal Grandfather     Social History   Tobacco Use  . Smoking status: Never Smoker  . Smokeless tobacco: Never Used  Substance Use Topics  . Alcohol use: No    Alcohol/week: 0.0 standard drinks    Comment: RARELY     Current Outpatient Medications:  .  baclofen (LIORESAL) 10 MG tablet, TAKE 1 TO 2 TABLETS BY MOUTH TWICE DAILY, Disp: 180 tablet, Rfl: 0 .  DULoxetine (CYMBALTA) 60 MG capsule, Take 1 capsule by mouth once daily, Disp: 90 capsule, Rfl: 0 .  ezetimibe (ZETIA) 10 MG tablet, Take 1 tablet by mouth once daily, Disp: 90 tablet, Rfl: 0 .  levothyroxine (SYNTHROID) 100 MCG tablet, Take 1 tablet by mouth once daily, Disp: 90 tablet, Rfl: 0 .  lisinopril (ZESTRIL) 20 MG tablet, Take 1 tablet by mouth once daily, Disp: 90 tablet, Rfl: 0 .  LORATADINE ALLERGY RELIEF PO, , Disp: , Rfl:  .  meloxicam (MOBIC) 15 MG tablet, Take 1 tablet by mouth once daily, Disp: 90 tablet, Rfl: 0 .  Multiple Vitamins-Minerals (HAIR SKIN NAILS PO), Take 1 tablet by mouth daily., Disp: , Rfl:  .  omeprazole (PRILOSEC) 20 MG capsule, Take 1 capsule by mouth once daily, Disp: 90 capsule, Rfl: 0 .  methylPREDNISolone (MEDROL DOSEPAK) 4 MG TBPK tablet, Take taper as directed on pack (Patient not taking: Reported on 08/29/2019), Disp: 21 tablet, Rfl: 0  Allergies  Allergen Reactions  .  Aspir-81 [Aspirin] Other (See Comments)    Coagulation disorder  . Lipitor [Atorvastatin] Other (See Comments)    Joint pain  . Statins Other (See Comments)  . Topamax [Topiramate] Other (See Comments)    Cognitive issues   . Vascepa [Icosapent Ethyl] Swelling and Other (See Comments)  . Voltaren [Diclofenac Sodium] Hives and Other (See Comments)    I personally reviewed active problem list, medication list, allergies, family history, social history, health maintenance with the patient/caregiver today.   ROS  Constitutional: Negative for fever , positive for  weight change - gained .  Respiratory: Negative for cough and shortness of breath.   Cardiovascular: Negative for  chest pain or palpitations.  Gastrointestinal: Negative for abdominal pain, no bowel changes.  Musculoskeletal: Negative for gait problem or joint swelling.  Skin: Negative for rash.  Neurological: Negative for dizziness , positiive for intermittent headache.  No other specific complaints in a complete review of systems (except as listed in HPI above).  Objective  Vitals:   08/29/19 0851  BP: (!) 142/88  Pulse: (!) 116  Resp: 20  Temp: 97.8 F (36.6 C)  TempSrc: Temporal  SpO2: 98%  Weight: 269 lb 4.8 oz (122.2 kg)  Height: 5\' 6"  (1.676 m)    Body mass index is 43.47 kg/m.  Physical Exam  Constitutional: Patient appears well-developed and well-nourished. Obese  No distress.  HEENT: head atraumatic, normocephalic, pupils equal and reactive to light, neck supple, normal thyroid , no cervical lymphadenopathy  Cardiovascular: Normal rate, regular rhythm and normal heart sounds.  No murmur heard. No BLE edema. Pulmonary/Chest: Effort normal and breath sounds normal. No respiratory distress. Abdominal: Soft.  There is no tenderness. Psychiatric: Patient has a normal mood and affect. behavior is normal. Judgment and thought content normal.  Recent Results (from the past 2160 hour(s))  POCT HgB A1C      Status: Abnormal   Collection Time: 08/29/19  9:03 AM  Result Value Ref Range   Hemoglobin A1C 5.8 (A) 4.0 - 5.6 %   HbA1c POC (<> result, manual entry)     HbA1c, POC (prediabetic range)     HbA1c, POC (controlled diabetic range)       PHQ2/9: Depression screen [facility] 2/9 08/29/2019 07/24/2019 05/24/2019 04/11/2019 12/06/2018  Decreased Interest 0 0 0 0 0  Down, Depressed, Hopeless 0 0 0 1 1  PHQ - 2 Score 0 0 0 1 1  Altered sleeping 1 0 - 0 2  Tired, decreased energy 0 0 - 0 1  Change in appetite 0 0 - 0 0  Feeling bad or failure about yourself  0 0 - 0 0  Trouble concentrating 1 0 - 0 1  Moving slowly or fidgety/restless 0 0 - 0 0  Suicidal thoughts 0 0 - 0 0  PHQ-9 Score 2 0 - 1 5  Difficult doing work/chores Not difficult at all Not difficult at all - Not difficult at all Not difficult at all  Some recent data might be hidden    phq 9 is negative   Fall Risk: Fall Risk  08/29/2019 07/24/2019 05/24/2019 04/11/2019 12/06/2018  Falls in the past year? 1 0 0 0 0  Number falls in past yr: 0 0 0 0 0  Injury with Fall? 0 0 0 0 0  Risk for fall due to : - - Orthopedic patient - -  Follow up - - Falls prevention discussed - -     Functional Status Survey: Is the patient deaf or have difficulty hearing?: No Does the patient have difficulty seeing, even when wearing glasses/contacts?: No Does the patient have difficulty concentrating, remembering, or making decisions?: No Does the patient have difficulty walking or climbing stairs?: No Does the patient have difficulty dressing or bathing?: No Does the patient have difficulty doing errands alone such as visiting a doctor's office or shopping?: No   Assessment & Plan  1. Controlled type 2 diabetes with neuropathy (HCC)  - POCT HgB A1C  2. MDD (major depressive disorder), in remission (HCC)  - DULoxetine (CYMBALTA) 30 MG capsule; Take 1 capsule (30 mg total) by mouth daily.  Dispense: 90 capsule; Refill: 1  3. Ventricular flutter  (HCC)  - metoprolol succinate (TOPROL-XL) 25 MG 24 hr tablet; Take 1 tablet (25 mg total) by mouth daily.  Dispense: 90 tablet; Refill: 1 Check TSH, seen by cardiologist in the past and it was secondary to too much hormone replacement  4. Morbid obesity ([place])  Discussed with the patient the risk posed by an increased BMI. Discussed importance of portion control, calorie counting and at least 150 minutes of physical activity weekly. Avoid sweet beverages and drink more water. Eat at least 6 servings of fruit and vegetables daily   5. Essential hypertension  - lisinopril (ZESTRIL) 20 MG tablet; Take 1 tablet (20 mg total) by mouth daily.  Dispense: 90 tablet; Refill: 1  6. Mild intermittent  asthma without complication   7. Primary osteoarthritis of both knees  - meloxicam (MOBIC) 15 MG tablet; Take 1 tablet (15 mg total) by mouth daily.  Dispense: 90 tablet; Refill: 1  8. OSA (obstructive sleep apnea)   9. Other specified hypothyroidism  - TSH  10. Dyslipidemia associated with type 2 diabetes mellitus (HCC)  - ezetimibe (ZETIA) 10 MG tablet; Take 1 tablet (10 mg total) by mouth daily.  Dispense: 90 tablet; Refill: 1 - Lipid panel - Microalbumin / creatinine urine ratio  11. Long-term use of high-risk medication  - CBC with Differential/Platelet - COMPLETE METABOLIC PANEL WITH GFR  12. DDD (degenerative disc disease), lumbar  - meloxicam (MOBIC) 15 MG tablet; Take 1 tablet (15 mg total) by mouth daily.  Dispense: 90 tablet; Refill: 1 - baclofen (LIORESAL) 10 MG tablet; Take 1-2 tablets (10-20 mg total) by mouth 2 (two) times daily.  Dispense: 180 tablet; Refill: 1  13. Throat irritation  - Ambulatory referral to ENT  14. Food allergy  - Ambulatory referral to ENT  15. Myalgia due to statin

## 2019-08-30 LAB — LIPID PANEL
Cholesterol: 262 mg/dL — ABNORMAL HIGH (ref <200)
HDL: 40 mg/dL — ABNORMAL LOW (ref >=50)
LDL Cholesterol (Calc): 171 mg/dL (calc) — ABNORMAL HIGH
Non-HDL Cholesterol (Calc): 222 mg/dL (calc) — ABNORMAL HIGH (ref <130)
Total CHOL/HDL Ratio: 6.6 (calc) — ABNORMAL HIGH (ref <5.0)
Triglycerides: 303 mg/dL — ABNORMAL HIGH (ref <150)

## 2019-08-30 LAB — COMPLETE METABOLIC PANEL WITH GFR
AG Ratio: 1.9 (calc) (ref 1.0–2.5)
ALT: 17 U/L (ref 6–29)
AST: 22 U/L (ref 10–35)
Albumin: 3.9 g/dL (ref 3.6–5.1)
Alkaline phosphatase (APISO): 82 U/L (ref 37–153)
BUN: 18 mg/dL (ref 7–25)
CO2: 26 mmol/L (ref 20–32)
Calcium: 9.1 mg/dL (ref 8.6–10.4)
Chloride: 106 mmol/L (ref 98–110)
Creat: 0.87 mg/dL (ref 0.50–0.99)
GFR, Est African American: 80 mL/min/{1.73_m2} (ref >=60)
GFR, Est Non African American: 69 mL/min/{1.73_m2} (ref >=60)
Globulin: 2.1 g/dL (calc) (ref 1.9–3.7)
Glucose, Bld: 93 mg/dL (ref 65–99)
Potassium: 4.4 mmol/L (ref 3.5–5.3)
Sodium: 142 mmol/L (ref 135–146)
Total Bilirubin: 0.4 mg/dL (ref 0.2–1.2)
Total Protein: 6 g/dL — ABNORMAL LOW (ref 6.1–8.1)

## 2019-08-30 LAB — CBC WITH DIFFERENTIAL/PLATELET
Absolute Monocytes: 288 cells/uL (ref 200–950)
Basophils Absolute: 29 cells/uL (ref 0–200)
Basophils Relative: 0.6 %
Eosinophils Absolute: 168 cells/uL (ref 15–500)
Eosinophils Relative: 3.5 %
HCT: 40.6 % (ref 35.0–45.0)
Hemoglobin: 13.6 g/dL (ref 11.7–15.5)
Lymphs Abs: 811 cells/uL — ABNORMAL LOW (ref 850–3900)
MCH: 32.2 pg (ref 27.0–33.0)
MCHC: 33.5 g/dL (ref 32.0–36.0)
MCV: 96.2 fL (ref 80.0–100.0)
MPV: 9.8 fL (ref 7.5–12.5)
Monocytes Relative: 6 %
Neutro Abs: 3504 cells/uL (ref 1500–7800)
Neutrophils Relative %: 73 %
Platelets: 264 10*3/uL (ref 140–400)
RBC: 4.22 10*6/uL (ref 3.80–5.10)
RDW: 13.2 % (ref 11.0–15.0)
Total Lymphocyte: 16.9 %
WBC: 4.8 10*3/uL (ref 3.8–10.8)

## 2019-08-30 LAB — MICROALBUMIN / CREATININE URINE RATIO
Creatinine, Urine: 116 mg/dL (ref 20–275)
Microalb Creat Ratio: 3 mcg/mg creat (ref <30)
Microalb, Ur: 0.4 mg/dL

## 2019-08-30 LAB — TSH: TSH: 1.67 mIU/L (ref 0.40–4.50)

## 2019-09-04 ENCOUNTER — Other Ambulatory Visit: Payer: Self-pay | Admitting: Family Medicine

## 2019-09-04 ENCOUNTER — Encounter: Payer: Self-pay | Admitting: Family Medicine

## 2019-09-04 DIAGNOSIS — E1169 Type 2 diabetes mellitus with other specified complication: Secondary | ICD-10-CM

## 2019-09-04 DIAGNOSIS — E785 Hyperlipidemia, unspecified: Secondary | ICD-10-CM

## 2019-09-04 MED ORDER — REPATHA 140 MG/ML ~~LOC~~ SOSY: 140.0000 mg | PREFILLED_SYRINGE | SUBCUTANEOUS | 5 refills | Status: DC

## 2019-09-04 NOTE — Progress Notes (Unsigned)
r 

## 2019-09-06 DIAGNOSIS — R05 Cough: Secondary | ICD-10-CM | POA: Diagnosis not present

## 2019-09-06 DIAGNOSIS — J301 Allergic rhinitis due to pollen: Secondary | ICD-10-CM | POA: Diagnosis not present

## 2019-09-06 DIAGNOSIS — K219 Gastro-esophageal reflux disease without esophagitis: Secondary | ICD-10-CM | POA: Diagnosis not present

## 2019-09-15 DIAGNOSIS — G4733 Obstructive sleep apnea (adult) (pediatric): Secondary | ICD-10-CM | POA: Diagnosis not present

## 2019-09-26 ENCOUNTER — Other Ambulatory Visit: Payer: Self-pay | Admitting: Family Medicine

## 2019-09-26 MED ORDER — LOSARTAN POTASSIUM 50 MG PO TABS: 50.0000 mg | ORAL_TABLET | Freq: Every day | ORAL | 1 refills | Status: DC

## 2019-10-01 ENCOUNTER — Other Ambulatory Visit: Payer: Self-pay

## 2019-10-05 ENCOUNTER — Telehealth: Payer: Self-pay | Admitting: Family Medicine

## 2019-10-05 NOTE — Chronic Care Management (AMB) (Signed)
Chronic Care Management   Outreach Note  10/05/2019 Name: Sarah Gordon MRN: [id number] DOB: [birth year]  Sarah Gordon is a 69 y.o. year old female who is a primary care patient of Alba Cory, MD. I reached out to Sarah Gordon by phone today in response to a referral sent by Ms. Hoy Morn Aller's health plan.     An unsuccessful telephone outreach was attempted today. The patient was referred to the case management team for assistance with care management and care coordination.   Follow Up Plan: A HIPPA compliant phone message was left for the patient providing contact information and requesting a return call.  The care management team will reach out to the patient again over the next 7 days.  If patient returns call to provider office, please advise to call Embedded Care Management Care Guide Penne Lash  at [phone]  Penne Lash, RMA Care Guide, Embedded Care Coordination [facility]  [place], [place] [address] Direct Dial: [phone] [email or web address]: [email or web address]

## 2019-10-17 ENCOUNTER — Telehealth (INDEPENDENT_AMBULATORY_CARE_PROVIDER_SITE_OTHER): Payer: Medicare Other | Admitting: Family Medicine

## 2019-10-17 ENCOUNTER — Other Ambulatory Visit: Payer: Self-pay

## 2019-10-17 ENCOUNTER — Encounter: Payer: Self-pay | Admitting: Family Medicine

## 2019-10-17 ENCOUNTER — Other Ambulatory Visit: Payer: Self-pay | Admitting: Family Medicine

## 2019-10-17 VITALS — BP 156/88 | Temp 97.9°F

## 2019-10-17 DIAGNOSIS — J0111 Acute recurrent frontal sinusitis: Secondary | ICD-10-CM | POA: Diagnosis not present

## 2019-10-17 DIAGNOSIS — J4521 Mild intermittent asthma with (acute) exacerbation: Secondary | ICD-10-CM | POA: Diagnosis not present

## 2019-10-17 DIAGNOSIS — J069 Acute upper respiratory infection, unspecified: Secondary | ICD-10-CM | POA: Diagnosis not present

## 2019-10-17 DIAGNOSIS — E038 Other specified hypothyroidism: Secondary | ICD-10-CM

## 2019-10-17 MED ORDER — PREDNISONE 10 MG PO TABS: 10.0000 mg | ORAL_TABLET | Freq: Every day | ORAL | 0 refills | Status: DC

## 2019-10-17 MED ORDER — LEVOTHYROXINE SODIUM 100 MCG PO TABS: 100.0000 ug | ORAL_TABLET | Freq: Every day | ORAL | 0 refills | Status: DC

## 2019-10-17 MED ORDER — AZITHROMYCIN 500 MG PO TABS: 500.0000 mg | ORAL_TABLET | Freq: Every day | ORAL | 0 refills | Status: DC

## 2019-10-17 MED ORDER — BENZONATATE 100 MG PO CAPS: 100.0000 mg | ORAL_CAPSULE | Freq: Two times a day (BID) | ORAL | 0 refills | Status: DC | PRN

## 2019-10-17 NOTE — Progress Notes (Signed)
Name: Sarah Gordon   MRN: [id number]    DOB: Feb 21, 1951   Date:10/17/2019       Progress Note  Subjective  Chief Complaint  Chief Complaint  Patient presents with   Sinusitis   Headache    I connected with  [name] on 10/17/19 at  9:00 AM EDT by telephone and verified that I am speaking with the correct person using two identifiers.  I discussed the limitations, risks, security and privacy concerns of performing an evaluation and management service by telephone and the availability of in person appointments. Staff also discussed with the patient that there may be a patient responsible charge related to this service. Patient Location: at home  Provider Location: [facility]   HPI  URI: she states symptoms started 6 days ago. They were driving back home from [place]. She states initially she had a lot of nasal congestion, sinus pressure, she states after a couple of days the congestion moved to her chest, developed a cough that is dry - occasionally wet sounding, and has noticed some wheezing. . She has been using Flonase bid, and also albuterol inhaler prn She states she is starting to feel better. The cough is not as frequent, now headache is localized on left frontal area, she has some nasal drainage. She had COVID-19 vaccine but did not get tested for COVID-19. BP is elevated, she is taking otc decongestive medication    Patient Active Problem List   Diagnosis Date Noted   Ventricular flutter ([place]) 12/06/2018   MDD (major depressive disorder), recurrent episode, mild ([place]) 09/04/2018   DDD (degenerative disc disease), cervical 01/23/2016   Degenerative joint disease of left acromioclavicular joint 01/23/2016   Recurrent oral herpes simplex infection 09/01/2015   Irritable bowel syndrome with diarrhea 08/07/2015   Statin intolerance 02/28/2015   Asthma, intermittent 09/10/2014   Controlled type 2 diabetes with neuropathy ([place]) 09/10/2014   Fibromyalgia 09/10/2014    Obstructive sleep apnea 09/10/2014   Hyperlipidemia 12/29/2012   Essential hypertension 12/29/2012   Morbid obesity ([place]) 12/29/2012   Arthritis due to gout 12/13/2007   Acid reflux 10/24/2006   Adult hypothyroidism 10/24/2006   Localized osteoarthrosis 10/24/2006    Past Surgical History:  Procedure Laterality Date   BREAST BIOPSY Left    benign   COLONOSCOPY     Dr Alveta Heimlich   COLONOSCOPY WITH PROPOFOL N/A 11/19/2015   Procedure: COLONOSCOPY WITH PROPOFOL;  Surgeon: Christene Lye, MD;  Location: [facility] ENDOSCOPY;  Service: Endoscopy;  Laterality: N/A;   ESOPHAGOGASTRODUODENOSCOPY N/A 08/20/2014   Procedure: ESOPHAGOGASTRODUODENOSCOPY (EGD);  Surgeon: Christene Lye, MD;  Location: [facility] ENDOSCOPY;  Service: Endoscopy;  Laterality: N/A;   FOOT SURGERY Left    heel spur   HERNIA REPAIR     umbilical   NASAL SINUS SURGERY  [id number]   UMBILICAL HERNIA REPAIR      Family History  Problem Relation Age of Onset   Hypertension Mother    Hyperlipidemia Mother    Diabetes Mother    Heart disease Mother    Stroke Mother    Heart disease Brother 33       CABG x 3    Heart attack Brother 32   Lupus Sister    Arthritis Sister    Asthma Sister    Depression Sister    Diabetes Brother    Hypertension Brother    Obesity Brother    Early death Brother    73 / Stillbirths Sister  Stroke Maternal Grandfather     Social History   Socioeconomic History   Marital status: Married    Spouse name: Georgena Spurling    Number of children: 1   Years of education: Not on file   Highest education level: Some college, no degree  Occupational History    Comment: works part time at [employer]  Tobacco Use   Smoking status: Never Smoker   Smokeless tobacco: Never Used  Scientific laboratory technician Use: Never used  Substance and Sexual Activity   Alcohol use: No    Alcohol/week: 0.0 standard drinks    Comment: RARELY   Drug use: No   Sexual  activity: Never  Other Topics Concern   Not on file  Social History Narrative   Not on file   Social Determinants of Health   Financial Resource Strain: Low Risk    Difficulty of Paying Living Expenses: Not very hard  Food Insecurity: No Food Insecurity   Worried About Charity fundraiser in the Last Year: Never true   [place] in the Last Year: Never true  Transportation Needs: No Transportation Needs   Lack of Transportation (Medical): No   Lack of Transportation (Non-Medical): No  Physical Activity: Inactive   Days of Exercise per Week: 0 days   Minutes of Exercise per Session: 0 min  Stress: No Stress Concern Present   Feeling of Stress : Not at all  Social Connections: Socially Integrated   Frequency of Communication with Friends and Family: More than three times a week   Frequency of Social Gatherings with Friends and Family: More than three times a week   Attends Religious Services: More than 4 times per year   Active Member of [employer] or Organizations: Yes   Attends Music therapist: More than 4 times per year   Marital Status: Married  Human resources officer Violence: Not At Risk   Fear of Current or Ex-Partner: No   Emotionally Abused: No   Physically Abused: No   Sexually Abused: No     Current Outpatient Medications:    baclofen (LIORESAL) 10 MG tablet, Take 1-2 tablets (10-20 mg total) by mouth 2 (two) times daily., Disp: 180 tablet, Rfl: 1   DULoxetine (CYMBALTA) 30 MG capsule, Take 1 capsule (30 mg total) by mouth daily., Disp: 90 capsule, Rfl: 1   ezetimibe (ZETIA) 10 MG tablet, Take 1 tablet (10 mg total) by mouth daily., Disp: 90 tablet, Rfl: 1   fluticasone (FLOVENT HFA) 110 MCG/ACT inhaler, Inhale 2 puffs into the lungs 2 (two) times daily., Disp: , Rfl:    levothyroxine (SYNTHROID) 100 MCG tablet, Take 1 tablet by mouth once daily, Disp: 90 tablet, Rfl: 0   LORATADINE ALLERGY RELIEF PO, , Disp: , Rfl:    losartan  (COZAAR) 50 MG tablet, Take 1 tablet (50 mg total) by mouth daily. In place of lisinopril, Disp: 90 tablet, Rfl: 1   meloxicam (MOBIC) 15 MG tablet, Take 1 tablet (15 mg total) by mouth daily., Disp: 90 tablet, Rfl: 1   metoprolol succinate (TOPROL-XL) 25 MG 24 hr tablet, Take 1 tablet (25 mg total) by mouth daily., Disp: 90 tablet, Rfl: 1   Multiple Vitamins-Minerals (HAIR SKIN NAILS PO), Take 1 tablet by mouth daily., Disp: , Rfl:    omeprazole (PRILOSEC) 20 MG capsule, Take 1 capsule (20 mg total) by mouth daily., Disp: 180 capsule, Rfl: 1  Allergies  Allergen Reactions   Aspir-81 [Aspirin] Other (See Comments)  Coagulation disorder   Lipitor [Atorvastatin] Other (See Comments)    Joint pain   Statins Other (See Comments)   Topamax [Topiramate] Other (See Comments)    Cognitive issues    Vascepa [Icosapent Ethyl] Swelling and Other (See Comments)   Voltaren [Diclofenac Sodium] Hives and Other (See Comments)    I personally reviewed active problem list, medication list, allergies, family history, social history, health maintenance with the patient/caregiver today.   ROS  Ten systems reviewed and is negative except as mentioned in HPI   Objective  Virtual encounter, vitals not obtained.  There is no height or weight on file to calculate BMI.  Physical Exam  Awake, alert and oriented  PHQ2/9: Depression screen [facility] 2/9 10/17/2019 08/29/2019 07/24/2019 05/24/2019 04/11/2019  Decreased Interest 0 0 0 0 0  Down, Depressed, Hopeless 0 0 0 0 1  PHQ - 2 Score 0 0 0 0 1  Altered sleeping 0 1 0 - 0  Tired, decreased energy 0 0 0 - 0  Change in appetite 0 0 0 - 0  Feeling bad or failure about yourself  0 0 0 - 0  Trouble concentrating 0 1 0 - 0  Moving slowly or fidgety/restless 0 0 0 - 0  Suicidal thoughts 0 0 0 - 0  PHQ-9 Score 0 2 0 - 1  Difficult doing work/chores - Not difficult at all Not difficult at all - Not difficult at all  Some recent data might be hidden    PHQ-2/9 Result is negative.    Fall Risk: Fall Risk  10/17/2019 08/29/2019 07/24/2019 05/24/2019 04/11/2019  Falls in the past year? 0 1 0 0 0  Number falls in past yr: 0 0 0 0 0  Injury with Fall? 0 0 0 0 0  Risk for fall due to : - - - Orthopedic patient -  Follow up - - - Falls prevention discussed -     Assessment & Plan  1. URI, acute  Advised fluids, rest, stop taking decongestive medications since bp is elevated, take Coricidin HBP, explained importance of COVID testing even though she had vaccines. Self isolation until results are back.   2. Acute recurrent frontal sinusitis  - azithromycin (ZITHROMAX) 500 MG tablet; Take 1 tablet (500 mg total) by mouth daily.  Dispense: 3 tablet; Refill: 0  3. Occasional asthma, mild intermittent, with acute exacerbation  - azithromycin (ZITHROMAX) 500 MG tablet; Take 1 tablet (500 mg total) by mouth daily.  Dispense: 3 tablet; Refill: 0 - predniSONE (DELTASONE) 10 MG tablet; Take 1 tablet (10 mg total) by mouth daily with breakfast.  Dispense: 10 tablet; Refill: 0 - benzonatate (TESSALON) 100 MG capsule; Take 1-2 capsules (100-200 mg total) by mouth 2 (two) times daily as needed.  Dispense: 40 capsule; Refill: 0 I discussed the assessment and treatment plan with the patient. The patient was provided an opportunity to ask questions and all were answered. The patient agreed with the plan and demonstrated an understanding of the instructions.   The patient was advised to call back or seek an in-person evaluation if the symptoms worsen or if the condition fails to improve as anticipated.  I provided 25 minutes of non-face-to-face time during this encounter.  Loistine Chance, MD

## 2019-10-17 NOTE — Chronic Care Management (AMB) (Signed)
Chronic Care Management   Outreach Note  10/17/2019 Name: Sarah Gordon MRN: [id number] DOB: 1950/07/24  Sarah Gordon is a 69 y.o. year old female who is a primary care patient of Alba Cory, MD. I reached out to [name] by phone today in response to a referral sent by [name]'s health plan.     A second unsuccessful telephone outreach was attempted today. The patient was referred to the case management team for assistance with care management and care coordination.   Follow Up Plan: A HIPPA compliant phone message was left for the patient providing contact information and requesting a return call.  The care management team will reach out to the patient again over the next 7 days.  If patient returns call to provider office, please advise to call Embedded Care Management Care Guide Penne Lash at [phone]  Penne Lash, RMA Care Guide, Embedded Care Coordination [facility]  [place], [place] [address] Direct Dial: [phone] [email or web address] Website: [email or web address]

## 2019-10-18 ENCOUNTER — Telehealth: Payer: Self-pay

## 2019-10-18 NOTE — Telephone Encounter (Signed)
Copied from [place] [id number]. Topic: General - Other >> Oct 18, 2019 11:49 AM Celene Kras wrote: Reason for CRM: Pt called stating that she went and had a covid test as advised and that the results were negative. Please advise.

## 2019-10-22 NOTE — Chronic Care Management (AMB) (Signed)
  Chronic Care Management   Note  10/22/2019 Name: KA BENCH MRN: [id number] DOB: Feb 08, 1951  MANASVI DICKARD is a 69 y.o. year old female who is a primary care patient of Steele Sizer, MD. I reached out to [name] by phone today in response to a referral sent by Ms. [place] health plan.     Ms. Eve was given information about Chronic Care Management services today including:  1. CCM service includes personalized support from designated clinical staff supervised by her physician, including individualized plan of care and coordination with other care providers 2. 24/7 contact phone numbers for assistance for urgent and routine care needs. 3. Service will only be billed when office clinical staff spend 20 minutes or more in a month to coordinate care. 4. Only one practitioner may furnish and bill the service in a calendar month. 5. The patient may stop CCM services at any time (effective at the end of the month) by phone call to the office staff. 6. The patient will be responsible for cost sharing (co-pay) of up to 20% of the service fee (after annual deductible is met).  Patient agreed to services and verbal consent obtained.   Follow up plan: Telephone appointment with care management team member scheduled for: 11/02/2019  [facility]

## 2019-11-02 ENCOUNTER — Ambulatory Visit (INDEPENDENT_AMBULATORY_CARE_PROVIDER_SITE_OTHER): Payer: Medicare Other

## 2019-11-02 DIAGNOSIS — E785 Hyperlipidemia, unspecified: Secondary | ICD-10-CM

## 2019-11-02 DIAGNOSIS — E1140 Type 2 diabetes mellitus with diabetic neuropathy, unspecified: Secondary | ICD-10-CM | POA: Diagnosis not present

## 2019-11-02 DIAGNOSIS — E1169 Type 2 diabetes mellitus with other specified complication: Secondary | ICD-10-CM | POA: Diagnosis not present

## 2019-11-02 DIAGNOSIS — I10 Essential (primary) hypertension: Secondary | ICD-10-CM | POA: Diagnosis not present

## 2019-11-02 NOTE — Chronic Care Management (AMB) (Signed)
Chronic Care Management   Initial Visit Note  11/02/2019 Name: Sarah Gordon MRN: [id number] DOB: [birth year]  Primary Care Provider: Steele Sizer, MD Reason for referral : Chronic Care Management   Sarah Gordon is a 69 y.o. year old female who is a primary care patient of Steele Sizer, MD. The CCM team was consulted for assistance with chronic disease management and care coordination. A telephonic assessment was conducted today.  Review of Mrs. Patchen's status, including review of consultants reports, relevant labs and test results was conducted today. Collaboration with appropriate care team members was performed as part of the comprehensive evaluation and provision of chronic care management services.    SDOH (Social Determinants of Health) assessments performed: Yes SDOH Interventions     Most Recent Value  SDOH Interventions  Food Insecurity Interventions Intervention Not Indicated  Transportation Interventions Intervention Not Indicated        Medications: Outpatient Encounter Medications as of 11/02/2019  Medication Sig  . azithromycin (ZITHROMAX) 500 MG tablet Take 1 tablet (500 mg total) by mouth daily.  . baclofen (LIORESAL) 10 MG tablet Take 1-2 tablets (10-20 mg total) by mouth 2 (two) times daily.  . benzonatate (TESSALON) 100 MG capsule Take 1-2 capsules (100-200 mg total) by mouth 2 (two) times daily as needed.  . DULoxetine (CYMBALTA) 30 MG capsule Take 1 capsule (30 mg total) by mouth daily.  Marland Kitchen ezetimibe (ZETIA) 10 MG tablet Take 1 tablet (10 mg total) by mouth daily.  . fluticasone (FLOVENT HFA) 110 MCG/ACT inhaler Inhale 2 puffs into the lungs 2 (two) times daily.  Marland Kitchen levothyroxine (SYNTHROID) 100 MCG tablet Take 1 tablet (100 mcg total) by mouth daily.  Marland Kitchen LORATADINE ALLERGY RELIEF PO   . losartan (COZAAR) 50 MG tablet Take 1 tablet (50 mg total) by mouth daily. In place of lisinopril  . meloxicam (MOBIC) 15 MG tablet Take 1 tablet (15 mg total) by mouth  daily.  . metoprolol succinate (TOPROL-XL) 25 MG 24 hr tablet Take 1 tablet (25 mg total) by mouth daily.  . Multiple Vitamins-Minerals (HAIR SKIN NAILS PO) Take 1 tablet by mouth daily.  Marland Kitchen omeprazole (PRILOSEC) 20 MG capsule Take 1 capsule (20 mg total) by mouth daily.  . predniSONE (DELTASONE) 10 MG tablet Take 1 tablet (10 mg total) by mouth daily with breakfast.  . [DISCONTINUED] albuterol (PROVENTIL HFA;VENTOLIN HFA) 108 (90 Base) MCG/ACT inhaler Inhale 2 puffs into the lungs every 6 (six) hours as needed for wheezing or shortness of breath.  . [DISCONTINUED] Fluticasone-Salmeterol (ADVAIR DISKUS) 250-50 MCG/DOSE AEPB Inhale 1 puff into the lungs 2 (two) times daily. (Patient not taking: Reported on 05/24/2019)  . [DISCONTINUED] montelukast (SINGULAIR) 10 MG tablet Take 1 tablet (10 mg total) by mouth at bedtime. (Patient not taking: Reported on 05/24/2019)   No facility-administered encounter medications on file as of 11/02/2019.     Objective:  BP Readings from Last 3 Encounters:  10/17/19 (!) 156/88  08/29/19 (!) 142/88  07/24/19 (!) 150/90    Lab Results  Component Value Date   CHOL 262 (H) 08/29/2019   HDL 40 (L) 08/29/2019   LDLCALC 171 (H) 08/29/2019   TRIG 303 (H) 08/29/2019   CHOLHDL 6.6 (H) 08/29/2019    Lab Results  Component Value Date   HGBA1C 5.8 (A) 08/29/2019    Goals Addressed            This Visit's Progress   . Chronic Disease Management       CARE  PLAN ENTRY (see longitudinal plan of care for additional care plan information)  Current Barriers:  . Chronic Disease Management support and education needs related to Hypertension, Diabetes, Asthma and Hyperlipidemia  Case Manager Clinical Goal(s):  Over the next 120 days, patient will: . Attend medical appointments as scheduled. . Take medications as prescribed. . Monitor blood pressure and record readings. . Adhere to recommended cardiac prudent/diabetic diet.  . Follow recommended safety measures  to prevent falls and injuries. Over the next 30 days, patient will: . Update care management team if additional caregiver resources are needed. . Follow-up with PCP regarding effectiveness of duloxetine dose.  Interventions:  . Inter-disciplinary care team collaboration (see longitudinal plan of care) . Reviewed medications. Encouraged to take medications as prescribed and update provider if unable to tolerate prescribed regimen. Encouraged to notify care management team with concerns regarding prescription cost. Reports previous plan was to start Repatha injections but medication was changed d/t prescription cost. Will complete the required labs and discuss this during her next scheduled PCP visit. Will notify care management team if Repatha is recommended and medication assistance is needed.  . Discussed established blood pressure parameters and indications for notifying provider. Advised to monitor blood pressure several times a week if unable to monitor daily and record readings.  . Reviewed nutritional intake and compliance with recommended cardiac prudent/diabetic diet. Reports diabetes remains well controlled with diet. Attempts to monitor intake as advised. Encouraged to read nutritional labels and monitor intake of excessive sodium, saturated fats and concentrated sugar. . Discussed current activity tolerance. Reviewed safety and fall prevention measures. Encouraged to avoid prolonged outdoor activities during that heat of the day. Encouraged to take breaks as needed to prevent fatigue and overexertion. Advised to use inhalers as recommended to prevent asthma exacerbation. . Reviewed scheduled/upcoming provider appointments. Encouraged to attend appointments as scheduled to prevent delays in care. Encouraged to notify care management team with concerns regarding transportation.  . Discussed plan for ongoing care management and follow up. Provided direct contact information. 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 Declined current need for additional assistance in the home. Also attempting to prepare for her daughter's departure. Anticipates her daughter being out of the country for two years.  Reports doing well. She feels that she is handling the changes appropriately, but noticed increased episodes of sadness over the past week. She increased her duloxetine dose. Declined need for counseling.  Discussed PCP recommendation to double her current dose for a total of  60 mg a day. Advised of need to follow-up with PCP within the next  2-3 weeks. She is agreeable to this plan. Agreed to notify the team if assistance is needed prior to the next scheduled outreach.   Patient Self Care Activities:  . Self administers medications  . Attends scheduled provider appointments . Calls pharmacy for medication refills . Performs ADL's independently . Performs IADL's independently . Calls provider office for new concerns or questions   Initial goal documentation        Ms. Gergen was given information about Chronic Care Management services including:  1. CCM service includes personalized support from designated clinical staff supervised by her physician, including individualized plan of care and coordination with other care providers 2. 24/7 contact phone numbers for assistance for urgent and routine care needs. 3. Service will only be billed when office clinical staff spend 20 minutes or more in a month to coordinate care. 4. Only one  practitioner may furnish and bill the service in a calendar month. 5. The patient may stop CCM services at any time (effective at the end of the month) by phone call to the office staff. 6. The patient will be responsible for cost sharing (co-pay) of up to 20% of the service fee (after annual deductible is met).  Patient agreed to services and verbal consent obtained.    PLAN A member of the chronic care  management team will follow up with Mrs. Scholle next month.   C[facility] Care Management [facility] [phone]

## 2019-11-02 NOTE — Patient Instructions (Signed)
Thank you for allowing the Chronic Care Management team to participate in your care.   Goals Addressed            This Visit's Progress   . Chronic Disease Management       CARE PLAN ENTRY (see longitudinal plan of care for additional care plan information)  Current Barriers:  . Chronic Disease Management support and education needs related to Hypertension, Diabetes, Asthma and Hyperlipidemia  Case Manager Clinical Goal(s):  Over the next 120 days, patient will: . Attend medical appointments as scheduled. . Take medications as prescribed. . Monitor blood pressure and record readings. . Adhere to recommended cardiac prudent/diabetic diet.  . Follow recommended safety measures to prevent falls and injuries. Over the next 30 days, patient will: . Update care management team if additional caregiver resources are needed. . Follow-up with PCP regarding effectiveness of duloxetine dose.  Interventions:  . Inter-disciplinary care team collaboration (see longitudinal plan of care) . Reviewed medications. Encouraged to take medications as prescribed and update provider if unable to tolerate prescribed regimen. Encouraged to notify care management team with concerns regarding prescription cost. Reports previous plan was to start Repatha injections but medication was changed d/t prescription cost. Will complete the required labs and discuss this during her next scheduled PCP visit. Will notify care management team if Repatha is recommended and medication assistance is needed.  . Discussed established blood pressure parameters and indications for notifying provider. Advised to monitor blood pressure several times a week if unable to monitor daily and record readings.  . Reviewed nutritional intake and compliance with recommended cardiac prudent/diabetic diet. Reports diabetes remains well controlled with diet. Attempts to monitor intake as advised. Encouraged to read nutritional labels and monitor  intake of excessive sodium, saturated fats and concentrated sugar. . Discussed current activity tolerance. Reviewed safety and fall prevention measures. Encouraged to avoid prolonged outdoor activities during that heat of the day. Encouraged to take breaks as needed to prevent fatigue and overexertion. Advised to use inhalers as recommended to prevent asthma exacerbation. . Reviewed scheduled/upcoming provider appointments. Encouraged to attend appointments as scheduled to prevent delays in care. Encouraged to notify care management team with concerns regarding transportation.  . Discussed plan for ongoing care management and follow up. Provided direct contact information. 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 Declined current need for additional assistance in the home. Also attempting to prepare for her daughter's departure. Anticipates her daughter being out of the country for two years.  Reports doing well. She feels that she is handling the changes appropriately, but noticed increased episodes of sadness over the past week. She increased her duloxetine dose. Declined need for counseling.  Discussed PCP recommendation to double her current dose for a total of  60 mg a day. Advised of need to follow-up with PCP within the next  2-3 weeks. She is agreeable to this plan. Agreed to notify the team if assistance is needed prior to the next scheduled outreach.   Patient Self Care Activities:  . Self administers medications  . Attends scheduled provider appointments . Calls pharmacy for medication refills . Performs ADL's independently . Performs IADL's independently . Calls provider office for new concerns or questions   Initial goal documentation         Sarah Gordon was given information about Chronic Care Management services including:  1. CCM service includes personalized support from designated clinical staff supervised by her physician,  including individualized  plan of care and coordination with other care providers 2. 24/7 contact phone numbers for assistance for urgent and routine care needs. 3. Service will only be billed when office clinical staff spend 20 minutes or more in a month to coordinate care. 4. Only one practitioner may furnish and bill the service in a calendar month. 5. The patient may stop CCM services at any time (effective at the end of the month) by phone call to the office staff. 6. The patient will be responsible for cost sharing (co-pay) of up to 20% of the service fee (after annual deductible is met).  Patient agreed to services and verbal consent obtained.    Sarah Gordon verbalized understanding of the information discussed during the telephonic outreach today. Declined need for a mailed/printed copy of the instructions.   A member of the chronic care management team will follow up with Sarah Gordon next month.    [facility] [phone]

## 2019-11-07 ENCOUNTER — Other Ambulatory Visit: Payer: Self-pay

## 2019-11-07 ENCOUNTER — Ambulatory Visit (INDEPENDENT_AMBULATORY_CARE_PROVIDER_SITE_OTHER): Payer: Medicare Other

## 2019-11-07 ENCOUNTER — Ambulatory Visit (INDEPENDENT_AMBULATORY_CARE_PROVIDER_SITE_OTHER): Payer: Medicare Other | Admitting: Podiatry

## 2019-11-07 ENCOUNTER — Other Ambulatory Visit: Payer: Self-pay | Admitting: Podiatry

## 2019-11-07 ENCOUNTER — Encounter: Payer: Self-pay | Admitting: Podiatry

## 2019-11-07 DIAGNOSIS — M76822 Posterior tibial tendinitis, left leg: Secondary | ICD-10-CM

## 2019-11-07 DIAGNOSIS — M778 Other enthesopathies, not elsewhere classified: Secondary | ICD-10-CM

## 2019-11-07 DIAGNOSIS — M76812 Anterior tibial syndrome, left leg: Secondary | ICD-10-CM | POA: Diagnosis not present

## 2019-11-08 NOTE — Progress Notes (Signed)
Subjective:  Patient ID: Sarah Gordon, female    DOB: 15-May-1950,  MRN: [id number] HPI Chief Complaint  Patient presents with  . Foot Pain    Medial foot/anterior ankle through midfoot left - aching, swelling x few months, no injury,  tried muscle rubs and tylenol  . New Patient (Initial Visit)    est pt 79    68 y.o. female presents with the above complaint.   ROS: Denies fever chills nausea vomiting muscle aches pains calf pain back pain chest pain shortness of breath.  Past Medical History:  Diagnosis Date  . Allergy   . Asthma   . Cataract   . Esophageal reflux   . Gouty arthropathy, unspecified   . Herpes simplex without mention of complication   . Irritable bowel syndrome   . Localized osteoarthrosis not specified whether primary or secondary, unspecified site   . Migraine, unspecified, without mention of intractable migraine without mention of status migrainosus   . Mixed hyperlipidemia   . Myalgia and myositis, unspecified   . Obstructive sleep apnea (adult) (pediatric)   . Palpitations   . Type II or unspecified type diabetes mellitus without mention of complication, not stated as uncontrolled   . Umbilical hernia without mention of obstruction or gangrene   . Unspecified essential hypertension   . Unspecified hypothyroidism    Past Surgical History:  Procedure Laterality Date  . BREAST BIOPSY Left    benign  . COLONOSCOPY     Dr Thurmond Butts  . COLONOSCOPY WITH PROPOFOL N/A 11/19/2015   Procedure: COLONOSCOPY WITH PROPOFOL;  Surgeon: Kieth Brightly, MD;  Location: [facility] ENDOSCOPY;  Service: Endoscopy;  Laterality: N/A;  . ESOPHAGOGASTRODUODENOSCOPY N/A 08/20/2014   Procedure: ESOPHAGOGASTRODUODENOSCOPY (EGD);  Surgeon: Kieth Brightly, MD;  Location: [facility] ENDOSCOPY;  Service: Endoscopy;  Laterality: N/A;  . FOOT SURGERY Left    heel spur  . HERNIA REPAIR     umbilical  . NASAL SINUS SURGERY  1980s  . UMBILICAL HERNIA REPAIR      Current Outpatient  Medications:  .  azithromycin (ZITHROMAX) 500 MG tablet, Take 1 tablet (500 mg total) by mouth daily., Disp: 3 tablet, Rfl: 0 .  baclofen (LIORESAL) 10 MG tablet, Take 1-2 tablets (10-20 mg total) by mouth 2 (two) times daily., Disp: 180 tablet, Rfl: 1 .  benzonatate (TESSALON) 100 MG capsule, Take 1-2 capsules (100-200 mg total) by mouth 2 (two) times daily as needed., Disp: 40 capsule, Rfl: 0 .  DULoxetine (CYMBALTA) 30 MG capsule, Take 1 capsule (30 mg total) by mouth daily., Disp: 90 capsule, Rfl: 1 .  ezetimibe (ZETIA) 10 MG tablet, Take 1 tablet (10 mg total) by mouth daily., Disp: 90 tablet, Rfl: 1 .  fluticasone (FLOVENT HFA) 110 MCG/ACT inhaler, Inhale 2 puffs into the lungs 2 (two) times daily., Disp: , Rfl:  .  levothyroxine (SYNTHROID) 100 MCG tablet, Take 1 tablet (100 mcg total) by mouth daily., Disp: 90 tablet, Rfl: 0 .  LORATADINE ALLERGY RELIEF PO, , Disp: , Rfl:  .  losartan (COZAAR) 50 MG tablet, Take 1 tablet (50 mg total) by mouth daily. In place of lisinopril, Disp: 90 tablet, Rfl: 1 .  meloxicam (MOBIC) 15 MG tablet, Take 1 tablet (15 mg total) by mouth daily., Disp: 90 tablet, Rfl: 1 .  metoprolol succinate (TOPROL-XL) 25 MG 24 hr tablet, Take 1 tablet (25 mg total) by mouth daily., Disp: 90 tablet, Rfl: 1 .  Multiple Vitamins-Minerals (HAIR SKIN NAILS PO), Take 1  tablet by mouth daily., Disp: , Rfl:  .  omeprazole (PRILOSEC) 20 MG capsule, Take 1 capsule (20 mg total) by mouth daily., Disp: 180 capsule, Rfl: 1 .  predniSONE (DELTASONE) 10 MG tablet, Take 1 tablet (10 mg total) by mouth daily with breakfast., Disp: 10 tablet, Rfl: 0  Allergies  Allergen Reactions  . Aspir-81 [Aspirin] Other (See Comments)    Coagulation disorder  . Lipitor [Atorvastatin] Other (See Comments)    Joint pain  . Statins Other (See Comments)  . Topamax [Topiramate] Other (See Comments)    Cognitive issues   . Vascepa [Icosapent Ethyl] Swelling and Other (See Comments)  . Voltaren  [Diclofenac Sodium] Hives and Other (See Comments)   Review of Systems Objective:  There were no vitals filed for this visit.  General: Well developed, nourished, in no acute distress, alert and oriented x3   Dermatological: Skin is warm, dry and supple bilateral. Nails x 10 are well maintained; remaining integument appears unremarkable at this time. There are no open sores, no preulcerative lesions, no rash or signs of infection present.  Vascular: Dorsalis Pedis artery and Posterior Tibial artery pedal pulses are 2/4 bilateral with immedate capillary fill time. Pedal hair growth present. No varicosities and no lower extremity edema present bilateral.   Neruologic: Grossly intact via light touch bilateral. Vibratory intact via tuning fork bilateral. Protective threshold with Semmes Wienstein monofilament intact to all pedal sites bilateral. Patellar and Achilles deep tendon reflexes 2+ bilateral. No Babinski or clonus noted bilateral.   Musculoskeletal: No gross boney pedal deformities bilateral. No pain, crepitus, or limitation noted with foot and ankle range of motion bilateral. Muscular strength 5/5 in all groups tested bilateral.  She has pain on palpation of the insertion of the tibialis anterior near the first metatarsal medial cuneiform articulation. There is an area of fluctuance in here consistent with synovitis or insertional tendinitis. Nothing to correlate other than edema on the radiograph.  Gait: Unassisted, Nonantalgic.    Radiographs:  Radiographs taken today do not demonstrate any type of osseous abnormalities that would result in the patient's pain there are no acute findings. There is some soft tissue swelling of the anterior ankle as well as the posterior ankle subcutaneous edema in the leg.  Assessment & Plan:   Assessment: Tibialis anterior tendinitis at its insertion left.  Plan: Discussed etiology pathology conservative versus surgical therapies. At this point I  injected 10 mg of Kenalog at the insertion site. She tolerated procedure well without complications. She will also start meloxicam daily.     Tammye Kahler T. [place], [place]

## 2019-12-07 ENCOUNTER — Telehealth: Payer: Self-pay | Admitting: Family Medicine

## 2019-12-07 ENCOUNTER — Other Ambulatory Visit: Payer: Self-pay

## 2019-12-07 DIAGNOSIS — K219 Gastro-esophageal reflux disease without esophagitis: Secondary | ICD-10-CM

## 2019-12-07 MED ORDER — OMEPRAZOLE 20 MG PO CPDR: 20.0000 mg | DELAYED_RELEASE_CAPSULE | Freq: Every day | ORAL | 0 refills | Status: DC

## 2019-12-07 NOTE — Telephone Encounter (Signed)
Pt called stating that she is needing to have a new prescription for her omeprazole. She states that she has been taking it twice a day for a while, but the pharmacy states that they cannot fill it. Please advise.      [facility]  [place] [address]  Phone: [phone] Fax: [phone]  Hours: Not open 24 hours

## 2019-12-19 ENCOUNTER — Ambulatory Visit: Payer: Medicare Other | Admitting: Podiatry

## 2019-12-24 ENCOUNTER — Ambulatory Visit: Payer: Medicare Other | Attending: Internal Medicine

## 2019-12-24 DIAGNOSIS — Z23 Encounter for immunization: Secondary | ICD-10-CM

## 2019-12-24 NOTE — Progress Notes (Signed)
   Covid-19 Vaccination Clinic  Name:  Sarah Gordon    MRN: [id number] DOB: 16-Dec-1950  12/24/2019  Sarah Gordon was observed post Covid-19 immunization for 15 minutes without incident. She was provided with Vaccine Information Sheet and instruction to access the V-Safe system.   Sarah Gordon was instructed to call 911 with any severe reactions post vaccine: Marland Kitchen Difficulty breathing  . Swelling of face and throat  . A fast heartbeat  . A bad rash all over body  . Dizziness and weakness

## 2019-12-25 ENCOUNTER — Ambulatory Visit: Payer: Medicare Other

## 2019-12-27 ENCOUNTER — Ambulatory Visit: Payer: Medicare Other | Admitting: Family Medicine

## 2020-01-04 ENCOUNTER — Ambulatory Visit: Payer: Medicare Other

## 2020-01-04 DIAGNOSIS — E1169 Type 2 diabetes mellitus with other specified complication: Secondary | ICD-10-CM

## 2020-01-04 DIAGNOSIS — I10 Essential (primary) hypertension: Secondary | ICD-10-CM

## 2020-01-04 NOTE — Chronic Care Management (AMB) (Signed)
Chronic Care Management   Follow Up Note   01/04/2020 Name: MALAYNA NOORI MRN: [id number] DOB: [birth year]  Primary Care Provider: Steele Sizer, MD Reason for referral : Chronic Care Management   DONATA REDDICK is a 69 y.o. year old female who is a primary care patient of Steele Sizer, MD. She is currently engaged with the chronic care management program. A telephonic outreach was conducted today.  Review of Mrs. Cousar's status, including review of consultants reports, relevant labs and test results was conducted today. Collaboration with appropriate care team members was performed as part of the comprehensive evaluation and provision of chronic care management services.    SDOH (Social Determinants of Health) assessments performed: No     Outpatient Encounter Medications as of 01/04/2020  Medication Sig Note  . azithromycin (ZITHROMAX) 500 MG tablet Take 1 tablet (500 mg total) by mouth daily.   . baclofen (LIORESAL) 10 MG tablet Take 1-2 tablets (10-20 mg total) by mouth 2 (two) times daily.   . benzonatate (TESSALON) 100 MG capsule Take 1-2 capsules (100-200 mg total) by mouth 2 (two) times daily as needed.   . DULoxetine (CYMBALTA) 30 MG capsule Take 1 capsule (30 mg total) by mouth daily. 11/02/2019: Reports taking increased dose.  . ezetimibe (ZETIA) 10 MG tablet Take 1 tablet (10 mg total) by mouth daily.   . fluticasone (FLOVENT HFA) 110 MCG/ACT inhaler Inhale 2 puffs into the lungs 2 (two) times daily.   Marland Kitchen levothyroxine (SYNTHROID) 100 MCG tablet Take 1 tablet (100 mcg total) by mouth daily.   Marland Kitchen LORATADINE ALLERGY RELIEF PO    . losartan (COZAAR) 50 MG tablet Take 1 tablet (50 mg total) by mouth daily. In place of lisinopril   . meloxicam (MOBIC) 15 MG tablet Take 1 tablet (15 mg total) by mouth daily.   . metoprolol succinate (TOPROL-XL) 25 MG 24 hr tablet Take 1 tablet (25 mg total) by mouth daily.   . Multiple Vitamins-Minerals (HAIR SKIN NAILS PO) Take 1 tablet by  mouth daily.   Marland Kitchen omeprazole (PRILOSEC) 20 MG capsule Take 1 capsule (20 mg total) by mouth daily.   . predniSONE (DELTASONE) 10 MG tablet Take 1 tablet (10 mg total) by mouth daily with breakfast.   . [DISCONTINUED] albuterol (PROVENTIL HFA;VENTOLIN HFA) 108 (90 Base) MCG/ACT inhaler Inhale 2 puffs into the lungs every 6 (six) hours as needed for wheezing or shortness of breath.   . [DISCONTINUED] Fluticasone-Salmeterol (ADVAIR DISKUS) 250-50 MCG/DOSE AEPB Inhale 1 puff into the lungs 2 (two) times daily. (Patient not taking: Reported on 05/24/2019)   . [DISCONTINUED] montelukast (SINGULAIR) 10 MG tablet Take 1 tablet (10 mg total) by mouth at bedtime. (Patient not taking: Reported on 05/24/2019)    No facility-administered encounter medications on file as of 01/04/2020.       Goals Addressed            This Visit's Progress   . Chronic Disease Management       CARE PLAN ENTRY (see longitudinal plan of care for additional care plan information)  Current Barriers:  . Chronic Disease Management support and education needs related to Hypertension, Diabetes, Asthma and Hyperlipidemia  Case Manager Clinical Goal(s):  Over the next 120 days, patient will: . Attend medical appointments as scheduled. . Take medications as prescribed.-Complete . Monitor blood pressure and record readings. . Adhere to recommended cardiac prudent/diabetic diet.  . Follow recommended safety measures to prevent falls and injuries. Over the next 30 days,  patient will: . Update care management team if additional caregiver resources are needed.-Complete . Follow-up with PCP regarding effectiveness of duloxetine dose.-Complete  Interventions:  . Inter-disciplinary care team collaboration (see longitudinal plan of care) . Reviewed medications and treatment recommendations. Reports taking medications as prescribed. During our previous outreach she expressed concerns related to depression and her duloxetine dose was  increased. She reports significant improvement and feels that her symptoms are well controlled with the current dose. Also discussed plan regarding medications for hyperlipidemia. Previous plan was to start Repatha injections however there were concerns regarding the costs. Pending feedback from her provider regarding plan. Agreed to outreach with the CCM Pharmacist for medication assistance if Repatha is prescribed.  . Reviewed recent BP readings.  BP not at goal. Reports several elevated readings. Denies chest discomfort, headaches, dizziness or visual changes. Encouraged to monitor daily and record readings. Reviewed parameters and indications for seeking medical attention.   . Reviewed activity tolerance. Reports feeling tired and fatigued over the past few weeks. States it's not related to depression. Reports some episodes have been related to exertion. Denies changes with ambulation. No  falls. We discussed her current sleep hygiene. Reports occasionally having difficulty falling asleep but overall feels that she is able to rest well. We discussed worsening s/sx that require medical follow-up. Agreed to notify provider if symptoms persists.   . Discussed plan for care management follow up. Reports doing well. No urgent concerns or changes in care management needs. Will plan next outreach for December. Will follow-up sooner if referral for medication assistance is required.    Patient Self Care Activities:  . Self administers medications  . Attends scheduled provider appointments . Calls pharmacy for medication refills . Performs ADL's independently . Performs IADL's independently . Calls provider office for new concerns or questions   Please see past updates related to this goal by clicking on the "Past Updates" button in the selected goal            PLAN A member of the care management team will follow up with Mrs. Bhagat in December.     [facility] [phone]

## 2020-01-16 ENCOUNTER — Other Ambulatory Visit: Payer: Self-pay

## 2020-01-16 ENCOUNTER — Ambulatory Visit (INDEPENDENT_AMBULATORY_CARE_PROVIDER_SITE_OTHER): Payer: Medicare Other | Admitting: Podiatry

## 2020-01-16 ENCOUNTER — Encounter: Payer: Self-pay | Admitting: Podiatry

## 2020-01-16 DIAGNOSIS — M76812 Anterior tibial syndrome, left leg: Secondary | ICD-10-CM | POA: Diagnosis not present

## 2020-01-16 NOTE — Progress Notes (Signed)
She presents today states that her tibialis anterior tendinitis has resolved to the level of about 90%.  She states that it does not hurt every day just hurts with shoe gear at certain times.  Other than that she is doing much much better she is very happy with the outcome.  Objective: Vital signs are stable she alert oriented x3 there is no longer fluctuance in the distalmost aspect of the tibialis anterior tendon or its insertion site.  Assessment: Resolving tibialis anterior tendinitis 90% improved subjectively.  Plan: Discussed etiology pathology conservative surgical therapies at this point she is able to get back to her regular routine follow-up with me on as-needed basis.

## 2020-01-21 NOTE — Progress Notes (Signed)
Name: Sarah Gordon   MRN: [id number]    DOB: 1950/05/20   Date:01/22/2020       Progress Note  Subjective  Chief Complaint  Follow up   HPI   Throat irritation: going on for years, described as discomfort on right side when it is something dry that she eats, it causes her to have a dry cough, also bothers her at night. She has a history of asthma, also spoke to GI and was advised to take Omeprazole BID and symptoms improved a little, she saw ENT and was given reassurance.   HTN: bp is still elevated today,  she is compliant with medication, denies chest pain or palpitation. Since we added metoprolol heart rate has been at goal, we will add Losartan 100 mg daily and continue Metoprolol 25 mg XL  Ventricular flutter: seen by cardiologist, medication for thyroid adjusted and feeling well since. She states no recent episodes of palpitation . Doing well at this time  MDD: she is feeling better on duloxetine and denies , she is also happy that church has opened up again, phq 9 is trending up again.  Last visit she was noticing that she was feeling  numb, could not cry during a sad movie, she went down on Duloxetine dose from 60 mg to 30 mg during the Summer but phq 9 is up, she is very concerned about her memory, she states she misplace items, she is very concerned because her father had alcohol induced dementia and has two living sister with dementia. Discussed mindfulness. She does not want to go up on Duloxetine    DMII: doing well on life style modification. A1C at goal. Denies polyphagia, polydipsia or polyuria She has a history of neuropathy, very mild symptoms, she will schedule an eye exam. A1C is still at goal at 5.5 %, she is on Zetia for dyslipidemia.   DDD lumbar spine: seen at [facility] , had MRI done and showed herniated disc, taking meloxicam daily  ( discussed going down to half pill every other day) and baclofen , she had steroid injections twice and seems to help, not having  daily pain and very seldom radiates down her legs  She states she has been doing well lately .   Asthma: she has a dry cough , triggered by eating but also every night, it is down to once or twice a week now, not taking maintenance . No wheezing or SOB. Daughter was sick last week with cold symptoms ( she was negative for COVID-19), Debbie noticed some nasal congestion, rhinorrhea two days ago, no fever or sob, but felt hot last night   Morbid Obesity: BMI above 40, weight is stable, she is trying to cut down on ice cream  Hypothyroidism: she is taking medication daily, denies hair loss, she has chronic dry skin and chronic constipation   Patient Active Problem List   Diagnosis Date Noted   Ventricular flutter ([place]) 12/06/2018   MDD (major depressive disorder), recurrent episode, mild ([place]) 09/04/2018   DDD (degenerative disc disease), cervical 01/23/2016   Degenerative joint disease of left acromioclavicular joint 01/23/2016   Recurrent oral herpes simplex infection 09/01/2015   Irritable bowel syndrome with diarrhea 08/07/2015   Statin intolerance 02/28/2015   Asthma, intermittent 09/10/2014   Controlled type 2 diabetes with neuropathy ([place]) 09/10/2014   Fibromyalgia 09/10/2014   Obstructive sleep apnea 09/10/2014   Hyperlipidemia 12/29/2012   Essential hypertension 12/29/2012   Morbid obesity ([place]) 12/29/2012   Arthritis  due to gout 12/13/2007   Acid reflux 10/24/2006   Adult hypothyroidism 10/24/2006   Localized osteoarthrosis 10/24/2006    Past Surgical History:  Procedure Laterality Date   BREAST BIOPSY Left    benign   COLONOSCOPY     Dr Alveta Heimlich   COLONOSCOPY WITH PROPOFOL N/A 11/19/2015   Procedure: COLONOSCOPY WITH PROPOFOL;  Surgeon: Christene Lye, MD;  Location: [facility] ENDOSCOPY;  Service: Endoscopy;  Laterality: N/A;   ESOPHAGOGASTRODUODENOSCOPY N/A 08/20/2014   Procedure: ESOPHAGOGASTRODUODENOSCOPY (EGD);  Surgeon: Christene Lye,  MD;  Location: [facility] ENDOSCOPY;  Service: Endoscopy;  Laterality: N/A;   FOOT SURGERY Left    heel spur   HERNIA REPAIR     umbilical   NASAL SINUS SURGERY  [id number]   UMBILICAL HERNIA REPAIR      Family History  Problem Relation Age of Onset   Hypertension Mother    Hyperlipidemia Mother    Diabetes Mother    Heart disease Mother    Stroke Mother    Heart disease Brother 33       CABG x 3    Heart attack Brother 98   Lupus Sister    Arthritis Sister    Asthma Sister    Depression Sister    Diabetes Brother    Hypertension Brother    Obesity Brother    Early death Brother    61 / [place] Sister    Stroke Maternal Grandfather     Social History   Tobacco Use   Smoking status: Never Smoker   Smokeless tobacco: Never Used  Substance Use Topics   Alcohol use: No    Alcohol/week: 0.0 standard drinks    Comment: RARELY     Current Outpatient Medications:    baclofen (LIORESAL) 10 MG tablet, Take 1-2 tablets (10-20 mg total) by mouth 2 (two) times daily., Disp: 180 tablet, Rfl: 1   DULoxetine (CYMBALTA) 30 MG capsule, Take 1 capsule (30 mg total) by mouth daily., Disp: 90 capsule, Rfl: 1   ezetimibe (ZETIA) 10 MG tablet, Take 1 tablet (10 mg total) by mouth daily., Disp: 90 tablet, Rfl: 1   fluticasone (FLOVENT HFA) 110 MCG/ACT inhaler, Inhale 2 puffs into the lungs 2 (two) times daily., Disp: , Rfl:    levothyroxine (SYNTHROID) 100 MCG tablet, Take 1 tablet (100 mcg total) by mouth daily., Disp: 90 tablet, Rfl: 0   losartan (COZAAR) 100 MG tablet, Take 1 tablet (100 mg total) by mouth daily. In place of lisinopril, Disp: 90 tablet, Rfl: 1   meloxicam (MOBIC) 15 MG tablet, Take 1 tablet (15 mg total) by mouth daily., Disp: 90 tablet, Rfl: 1   metoprolol succinate (TOPROL-XL) 25 MG 24 hr tablet, Take 1 tablet (25 mg total) by mouth daily., Disp: 90 tablet, Rfl: 1   Multiple Vitamins-Minerals (HAIR SKIN NAILS PO), Take 1 tablet by  mouth daily., Disp: , Rfl:    LORATADINE ALLERGY RELIEF PO, , Disp: , Rfl:    omeprazole (PRILOSEC) 20 MG capsule, Take 1 capsule (20 mg total) by mouth daily., Disp: 180 capsule, Rfl: 1  Allergies  Allergen Reactions   Aspir-81 [Aspirin] Other (See Comments)    Coagulation disorder   Lipitor [Atorvastatin] Other (See Comments)    Joint pain   Statins Other (See Comments)   Topamax [Topiramate] Other (See Comments)    Cognitive issues    Vascepa [Icosapent Ethyl] Swelling and Other (See Comments)   Voltaren [Diclofenac Sodium] Hives and Other (See Comments)  I personally reviewed active problem list, medication list, allergies, family history, social history, health maintenance with the patient/caregiver today.   ROS  Constitutional: Negative for fever or weight change.  Respiratory: Negative for cough and shortness of breath.   Cardiovascular: Negative for chest pain or palpitations.  Gastrointestinal: Negative for abdominal pain, no bowel changes.  Musculoskeletal: Negative for gait problem or joint swelling.  Skin: Negative for rash.  Neurological: Negative for dizziness or headache.  No other specific complaints in a complete review of systems (except as listed in HPI above).  Objective  Vitals:   01/22/20 0900  BP: (!) 142/84  Pulse: 85  Resp: 18  Temp: 98.1 F (36.7 C)  TempSrc: Oral  SpO2: 98%  Weight: 270 lb 11.2 oz (122.8 kg)  Height: 5\' 6"  (1.676 m)    Body mass index is 43.69 kg/m.  Physical Exam  Constitutional: Patient appears well-developed and well-nourished. Obese  No distress.  HEENT: head atraumatic, normocephalic, pupils equal and reactive to light,  neck supple Cardiovascular: Normal rate, regular rhythm and normal heart sounds.  No murmur heard. No BLE edema. Pulmonary/Chest: Effort normal and breath sounds normal. No respiratory distress. Abdominal: Soft.  There is no tenderness. Psychiatric: Patient has a normal mood and affect.  behavior is normal. Judgment and thought content normal.   Diabetic Foot Exam: Diabetic Foot Exam - Simple   Simple Foot Form Diabetic Foot exam was performed with the following findings: Yes 01/22/2020  1:21 PM  Visual Inspection No deformities, no ulcerations, no other skin breakdown bilaterally: Yes Sensation Testing Intact to touch and monofilament testing bilaterally: Yes Pulse Check Posterior Tibialis and Dorsalis pulse intact bilaterally: Yes Comments      PHQ2/9: Depression screen [facility] 2/9 01/22/2020 11/02/2019 10/17/2019 08/29/2019 07/24/2019  Decreased Interest 0 0 0 0 0  Down, Depressed, Hopeless 1 1 0 0 0  PHQ - 2 Score 1 1 0 0 0  Altered sleeping 2 - 0 1 0  Tired, decreased energy 2 - 0 0 0  Change in appetite 1 - 0 0 0  Feeling bad or failure about yourself  0 - 0 0 0  Trouble concentrating 0 - 0 1 0  Moving slowly or fidgety/restless 0 - 0 0 0  Suicidal thoughts 0 - 0 0 0  PHQ-9 Score 6 - 0 2 0  Difficult doing work/chores Not difficult at all - - Not difficult at all Not difficult at all  Some recent data might be hidden    phq 9 is positive   Fall Risk: Fall Risk  01/22/2020 11/02/2019 10/17/2019 08/29/2019 07/24/2019  Falls in the past year? 0 0 0 1 0  Number falls in past yr: 0 - 0 0 0  Injury with Fall? 0 - 0 0 0  Risk for fall due to : - - - - -  Follow up - Falls prevention discussed - - -     Functional Status Survey: Is the patient deaf or have difficulty hearing?: No Does the patient have difficulty seeing, even when wearing glasses/contacts?: Yes Does the patient have difficulty concentrating, remembering, or making decisions?: Yes Does the patient have difficulty walking or climbing stairs?: No Does the patient have difficulty dressing or bathing?: No Does the patient have difficulty doing errands alone such as visiting a doctor's office or shopping?: No    Assessment & Plan  1. Controlled type 2 diabetes with neuropathy (HCC)  - HM Diabetes  Foot Exam - POCT HgB  A1C  2. Need for immunization against influenza  - Flu Vaccine QUAD High Dose(Fluad)  3. Mild recurrent major depression ([place])   4. Gastroesophageal reflux disease without esophagitis  - omeprazole (PRILOSEC) 20 MG capsule; Take 1 capsule (20 mg total) by mouth daily.  Dispense: 180 capsule; Refill: 1  5. OSA (obstructive sleep apnea)   6. Morbid obesity ([place])  Discussed with the patient the risk posed by an increased BMI. Discussed importance of portion control, calorie counting and at least 150 minutes of physical activity weekly. Avoid sweet beverages and drink more water. Eat at least 6 servings of fruit and vegetables daily   7. Dyslipidemia associated with type 2 diabetes mellitus (HCC)  - ezetimibe (ZETIA) 10 MG tablet; Take 1 tablet (10 mg total) by mouth daily.  Dispense: 90 tablet; Refill: 1  8. Essential hypertension   9. Ventricular flutter (HCC)  - metoprolol succinate (TOPROL-XL) 25 MG 24 hr tablet; Take 1 tablet (25 mg total) by mouth daily.  Dispense: 90 tablet; Refill: 1  10. Mild intermittent asthma in adult without complication   11. DDD (degenerative disc disease), lumbar  - baclofen (LIORESAL) 10 MG tablet; Take 1-2 tablets (10-20 mg total) by mouth 2 (two) times daily.  Dispense: 180 tablet; Refill: 1  12. Primary osteoarthritis of both knees   13. Myalgia due to statin   14. Other specified hypothyroidism  - levothyroxine (SYNTHROID) 100 MCG tablet; Take 1 tablet (100 mcg total) by mouth daily.  Dispense: 90 tablet; Refill: 0  15. Upper respiratory tract infection, unspecified type  Resume allergy medication    16. Memory difficulty  She is worried, but we will try mindfulness practice first, also try not taking baclofen during the day and we can either refer to neurologist or start her on medication after a MMS test if symptoms don't improve

## 2020-01-22 ENCOUNTER — Other Ambulatory Visit: Payer: Self-pay

## 2020-01-22 ENCOUNTER — Encounter: Payer: Self-pay | Admitting: Family Medicine

## 2020-01-22 ENCOUNTER — Ambulatory Visit (INDEPENDENT_AMBULATORY_CARE_PROVIDER_SITE_OTHER): Payer: Medicare Other | Admitting: Family Medicine

## 2020-01-22 VITALS — BP 142/84 | HR 85 | Temp 98.1°F | Resp 18 | Ht 66.0 in | Wt 270.7 lb

## 2020-01-22 DIAGNOSIS — E1169 Type 2 diabetes mellitus with other specified complication: Secondary | ICD-10-CM

## 2020-01-22 DIAGNOSIS — K219 Gastro-esophageal reflux disease without esophagitis: Secondary | ICD-10-CM | POA: Diagnosis not present

## 2020-01-22 DIAGNOSIS — Z23 Encounter for immunization: Secondary | ICD-10-CM

## 2020-01-22 DIAGNOSIS — I10 Essential (primary) hypertension: Secondary | ICD-10-CM

## 2020-01-22 DIAGNOSIS — J4520 Mild intermittent asthma, uncomplicated: Secondary | ICD-10-CM

## 2020-01-22 DIAGNOSIS — F330 Major depressive disorder, recurrent, mild: Secondary | ICD-10-CM

## 2020-01-22 DIAGNOSIS — E785 Hyperlipidemia, unspecified: Secondary | ICD-10-CM

## 2020-01-22 DIAGNOSIS — E1140 Type 2 diabetes mellitus with diabetic neuropathy, unspecified: Secondary | ICD-10-CM

## 2020-01-22 DIAGNOSIS — I4902 Ventricular flutter: Secondary | ICD-10-CM

## 2020-01-22 DIAGNOSIS — T466X5A Adverse effect of antihyperlipidemic and antiarteriosclerotic drugs, initial encounter: Secondary | ICD-10-CM

## 2020-01-22 DIAGNOSIS — M5136 Other intervertebral disc degeneration, lumbar region: Secondary | ICD-10-CM

## 2020-01-22 DIAGNOSIS — J069 Acute upper respiratory infection, unspecified: Secondary | ICD-10-CM

## 2020-01-22 DIAGNOSIS — M170 Bilateral primary osteoarthritis of knee: Secondary | ICD-10-CM | POA: Diagnosis not present

## 2020-01-22 DIAGNOSIS — M791 Myalgia, unspecified site: Secondary | ICD-10-CM

## 2020-01-22 DIAGNOSIS — E038 Other specified hypothyroidism: Secondary | ICD-10-CM

## 2020-01-22 DIAGNOSIS — R413 Other amnesia: Secondary | ICD-10-CM

## 2020-01-22 DIAGNOSIS — G4733 Obstructive sleep apnea (adult) (pediatric): Secondary | ICD-10-CM

## 2020-01-22 LAB — POCT GLYCOSYLATED HEMOGLOBIN (HGB A1C): Hemoglobin A1C: 5.5 % (ref 4.0–5.6)

## 2020-01-22 MED ORDER — METOPROLOL SUCCINATE ER 25 MG PO TB24: 25.0000 mg | ORAL_TABLET | Freq: Every day | ORAL | 1 refills | Status: DC

## 2020-01-22 MED ORDER — EZETIMIBE 10 MG PO TABS: 10.0000 mg | ORAL_TABLET | Freq: Every day | ORAL | 1 refills | Status: DC

## 2020-01-22 MED ORDER — BACLOFEN 10 MG PO TABS: 10.0000 mg | ORAL_TABLET | Freq: Two times a day (BID) | ORAL | 1 refills | Status: DC

## 2020-01-22 MED ORDER — OMEPRAZOLE 20 MG PO CPDR: 20.0000 mg | DELAYED_RELEASE_CAPSULE | Freq: Every day | ORAL | 1 refills | Status: DC

## 2020-01-22 MED ORDER — LOSARTAN POTASSIUM 100 MG PO TABS: 100.0000 mg | ORAL_TABLET | Freq: Every day | ORAL | 1 refills | Status: DC

## 2020-01-22 MED ORDER — LEVOTHYROXINE SODIUM 100 MCG PO TABS: 100.0000 ug | ORAL_TABLET | Freq: Every day | ORAL | 0 refills | Status: DC

## 2020-01-29 NOTE — Patient Instructions (Signed)
Thank you for allowing the Chronic Care Management team to participate in your care.  Goals Addressed            This Visit's Progress    Chronic Disease Management       CARE PLAN ENTRY (see longitudinal plan of care for additional care plan information)  Current Barriers:   Chronic Disease Management support and education needs related to Hypertension, Diabetes, Asthma and Hyperlipidemia  Case Manager Clinical Goal(s):  Over the next 120 days, patient will:  Attend medical appointments as scheduled.  Take medications as prescribed.-Complete  Monitor blood pressure and record readings.  Adhere to recommended cardiac prudent/diabetic diet.   Follow recommended safety measures to prevent falls and injuries. Over the next 30 days, patient will:  Update care management team if additional caregiver resources are needed.-Complete  Follow-up with PCP regarding effectiveness of duloxetine dose.-Complete  Interventions:   Inter-disciplinary care team collaboration (see longitudinal plan of care)  Reviewed medications and treatment recommendations. Reports taking medications as prescribed. During our previous outreach she expressed concerns related to depression and her duloxetine dose was increased. She reports significant improvement and feels that her symptoms are well controlled with the current dose. Also discussed plan regarding medications for hyperlipidemia. Previous plan was to start Repatha injections however there were concerns regarding the costs. Pending feedback from her provider regarding plan. Agreed to outreach with the CCM Pharmacist for medication assistance if Repatha is prescribed.   Reviewed recent BP readings.  BP not at goal. Reports several elevated readings. Denies chest discomfort, headaches, dizziness or visual changes. Encouraged to monitor daily and record readings. Reviewed parameters and indications for seeking medical attention.    Reviewed  activity tolerance. Reports feeling tired and fatigued over the past few weeks. States it's not related to depression. Reports some episodes have been related to exertion. Denies changes with ambulation. No  falls. We discussed her current sleep hygiene. Reports occasionally having difficulty falling asleep but overall feels that she is able to rest well. We discussed worsening s/sx that require medical follow-up. Agreed to notify provider if symptoms persists.    Discussed plan for care management follow up. Reports doing well. No urgent concerns or changes in care management needs. Will plan next outreach for December. Will follow-up sooner if referral for medication assistance is required.    Patient Self Care Activities:   Self administers medications   Attends scheduled provider appointments  Calls pharmacy for medication refills  Performs ADL's independently  Performs IADL's independently  Calls provider office for new concerns or questions   Please see past updates related to this goal by clicking on the "Past Updates" button in the selected goal        Sarah Gordon verbalized understanding of the information discussed during the telephonic outreach today. Declined need for a mailed/printed copy of the instructions.    A member of the care management team will follow up with Sarah Gordon in December.     Cristy [facility] Care Management [facility] [phone]

## 2020-02-29 ENCOUNTER — Other Ambulatory Visit: Payer: Self-pay | Admitting: Family Medicine

## 2020-02-29 DIAGNOSIS — R197 Diarrhea, unspecified: Secondary | ICD-10-CM

## 2020-02-29 DIAGNOSIS — R1010 Upper abdominal pain, unspecified: Secondary | ICD-10-CM

## 2020-03-07 ENCOUNTER — Telehealth: Payer: Self-pay

## 2020-03-25 ENCOUNTER — Ambulatory Visit: Payer: Medicare Other

## 2020-03-25 DIAGNOSIS — M5416 Radiculopathy, lumbar region: Secondary | ICD-10-CM | POA: Diagnosis not present

## 2020-04-02 DIAGNOSIS — Z20822 Contact with and (suspected) exposure to covid-19: Secondary | ICD-10-CM | POA: Diagnosis not present

## 2020-04-08 ENCOUNTER — Inpatient Hospital Stay: Admission: RE | Admit: 2020-04-08 | Payer: Medicare Other | Source: Ambulatory Visit

## 2020-04-08 ENCOUNTER — Ambulatory Visit: Payer: Medicare HMO

## 2020-04-20 DIAGNOSIS — Z20822 Contact with and (suspected) exposure to covid-19: Secondary | ICD-10-CM | POA: Diagnosis not present

## 2020-04-23 ENCOUNTER — Other Ambulatory Visit: Payer: Self-pay | Admitting: Family Medicine

## 2020-04-23 ENCOUNTER — Encounter: Payer: Self-pay | Admitting: Family Medicine

## 2020-04-23 ENCOUNTER — Telehealth: Payer: Self-pay

## 2020-04-23 DIAGNOSIS — U071 COVID-19: Secondary | ICD-10-CM

## 2020-04-23 NOTE — Telephone Encounter (Signed)
Copied from [place] [id number]. Topic: General - Other >> Apr 23, 2020  9:44 AM Leward Quan A wrote: Reason for CRM: Patient called in asking Dr Ancil Boozer to please refer her to get the Monoclonal infusion. Please call patient at Ph# [phone]

## 2020-04-24 ENCOUNTER — Telehealth: Payer: Self-pay | Admitting: Adult Health

## 2020-04-24 NOTE — Telephone Encounter (Signed)
Called to discuss with patient about COVID-19 symptoms and the use of one of the available treatments for those with mild to moderate Covid symptoms and at a high risk of hospitalization.  Pt appears to qualify for outpatient treatment due to co-morbid conditions and/or a member of an at-risk group in accordance with the FDA Emergency Use Authorization.    Symptom onset: 1/28  Vaccinated: Yes  Booster? YEs  Immunocompromised? NO  Qualifiers: yes   Unable to reach pt - Reached patient 04/24/2020, Sx day 7 today , no MAB slots available. Tomorrow will be outside of tx window. Patient to continue supportive care and follow up PCP .   Sarah Lacerda NP-C

## 2020-05-08 ENCOUNTER — Inpatient Hospital Stay: Admission: RE | Admit: 2020-05-08 | Payer: Medicare HMO | Source: Ambulatory Visit

## 2020-05-13 ENCOUNTER — Other Ambulatory Visit: Payer: Self-pay | Admitting: Family Medicine

## 2020-05-13 DIAGNOSIS — F3340 Major depressive disorder, recurrent, in remission, unspecified: Secondary | ICD-10-CM

## 2020-05-13 DIAGNOSIS — I10 Essential (primary) hypertension: Secondary | ICD-10-CM

## 2020-05-13 DIAGNOSIS — E038 Other specified hypothyroidism: Secondary | ICD-10-CM

## 2020-05-13 DIAGNOSIS — I4902 Ventricular flutter: Secondary | ICD-10-CM

## 2020-05-13 MED ORDER — LEVOTHYROXINE SODIUM 100 MCG PO TABS: 100.0000 ug | ORAL_TABLET | Freq: Every day | ORAL | 0 refills | Status: DC

## 2020-05-13 MED ORDER — DULOXETINE HCL 30 MG PO CPEP: 30.0000 mg | ORAL_CAPSULE | Freq: Every day | ORAL | 0 refills | Status: DC

## 2020-05-13 MED ORDER — LOSARTAN POTASSIUM 100 MG PO TABS: 100.0000 mg | ORAL_TABLET | Freq: Every day | ORAL | 0 refills | Status: DC

## 2020-05-13 MED ORDER — METOPROLOL SUCCINATE ER 25 MG PO TB24: 25.0000 mg | ORAL_TABLET | Freq: Every day | ORAL | 0 refills | Status: DC

## 2020-05-13 NOTE — Telephone Encounter (Signed)
Copied from [place] [id number]. Topic: Quick Communication - Rx Refill/Question >> May 13, 2020  5:23 PM Mcneil, Ja-Kwan wrote: Medication: losartan (COZAAR) 100 MG tablet, metoprolol succinate (TOPROL-XL) 25 MG 24 hr tablet, levothyroxine (SYNTHROID) 100 MCG tablet, and DULoxetine (CYMBALTA) 30 MG capsule  Has the patient contacted their pharmacy? yes - Pt requesting refill at new pharmacy and they do not have the prescription. Pt told to call provider  Preferred Pharmacy (with phone number or street name): [facility] [address] [place], [place] AT [address] Phone: [phone]   Fax: [phone]  Agent: Please be advised that RX refills may take up to 3 business days. We ask that you follow-up with your pharmacy.

## 2020-05-13 NOTE — Telephone Encounter (Signed)
Requested Prescriptions  Pending Prescriptions Disp Refills  . metoprolol succinate (TOPROL-XL) 25 MG 24 hr tablet 90 tablet 0    Sig: Take 1 tablet (25 mg total) by mouth daily.     Cardiovascular:  Beta Blockers Failed - 05/13/2020  6:06 PM      Failed - Last BP in normal range    BP Readings from Last 1 Encounters:  01/22/20 (!) 142/84         Passed - Last Heart Rate in normal range    Pulse Readings from Last 1 Encounters:  01/22/20 85         Passed - Valid encounter within last 6 months    Recent Outpatient Visits          3 months ago Controlled type 2 diabetes with neuropathy [facility])   [facility] Steele Sizer, MD   6 months ago URI, acute   [facility] [place], Drue Stager, MD   8 months ago Controlled type 2 diabetes with neuropathy [facility])   [facility] Steele Sizer, MD   9 months ago Rash and nonspecific skin eruption   [facility] [place], Kristeen Miss, PA-C   1 year ago Controlled type 2 diabetes with neuropathy [facility])   [facility] Steele Sizer, MD      Future Appointments            In 1 week Steele Sizer, MD [place]   In 2 weeks  [facility]           . losartan (COZAAR) 100 MG tablet 90 tablet 0    Sig: Take 1 tablet (100 mg total) by mouth daily. In place of lisinopril     Cardiovascular:  Angiotensin Receptor Blockers Failed - 05/13/2020  6:06 PM      Failed - Cr in normal range and within 180 days    Creat  Date Value Ref Range Status  08/29/2019 0.87 0.50 - 0.99 mg/dL Final    Comment:    For patients >95 years of age, the reference limit for Creatinine is approximately 13% higher for people identified as African-American. .    Creatinine, Urine  Date Value Ref Range Status  08/29/2019 116 20 - 275 mg/dL Final         Failed - K in normal range and within 180 days    Potassium  Date Value  Ref Range Status  08/29/2019 4.4 3.5 - 5.3 mmol/L Final         Failed - Last BP in normal range    BP Readings from Last 1 Encounters:  01/22/20 (!) 142/84         Passed - Patient is not pregnant      Passed - Valid encounter within last 6 months    Recent Outpatient Visits          3 months ago Controlled type 2 diabetes with neuropathy [facility])   [facility] Steele Sizer, MD   6 months ago URI, acute   [facility] [place], Drue Stager, MD   8 months ago Controlled type 2 diabetes with neuropathy [facility])   [facility] Steele Sizer, MD   9 months ago Rash and nonspecific skin eruption   [facility] [place], Kristeen Miss, PA-C   1 year ago Controlled type 2 diabetes with neuropathy [facility])   [facility] [place], [place],  MD      Future Appointments            In 1 week [place], Drue Stager, MD [place]   In 2 weeks  [facility]           . levothyroxine (SYNTHROID) 100 MCG tablet 90 tablet 0    Sig: Take 1 tablet (100 mcg total) by mouth daily.     Endocrinology:  Hypothyroid Agents Failed - 05/13/2020  6:06 PM      Failed - TSH needs to be rechecked within 3 months after an abnormal result. Refill until TSH is due.      Passed - TSH in normal range and within 360 days    TSH  Date Value Ref Range Status  08/29/2019 1.67 0.40 - 4.50 mIU/L Final         Passed - Valid encounter within last 12 months    Recent Outpatient Visits          3 months ago Controlled type 2 diabetes with neuropathy [facility])   [facility] Steele Sizer, MD   6 months ago URI, acute   [facility] [place], Drue Stager, MD   8 months ago Controlled type 2 diabetes with neuropathy [facility])   [facility] Sizer, MD   9 months ago Rash and nonspecific skin eruption   [facility]  [place], Kristeen Miss, PA-C   1 year ago Controlled type 2 diabetes with neuropathy [facility])   [facility] Sizer, MD      Future Appointments            In 1 week Steele Sizer, MD [facility], [place]   In 2 weeks  [facility], [place]           . DULoxetine (CYMBALTA) 30 MG capsule 90 capsule 0    Sig: Take 1 capsule (30 mg total) by mouth daily.     Psychiatry: Antidepressants - SNRI Failed - 05/13/2020  6:06 PM      Failed - Last BP in normal range    BP Readings from Last 1 Encounters:  01/22/20 (!) 142/84         Passed - Completed PHQ-2 or PHQ-9 in the last 360 days      Passed - Valid encounter within last 6 months    Recent Outpatient Visits          3 months ago Controlled type 2 diabetes with neuropathy [facility])   [facility]izer, MD   6 months ago URI, acute   [facility] [place], Drue Stager, MD   8 months ago Controlled type 2 diabetes with neuropathy [facility])   [facility] Sizer, MD   9 months ago Rash and nonspecific skin eruption   [facility] [place], Kristeen Miss, PA-C   1 year ago Controlled type 2 diabetes with neuropathy [facility])   [facility] Sizer, MD      Future Appointments            In 1 week Steele Sizer, MD [facility], [place]   In 2 weeks  [place]

## 2020-05-15 ENCOUNTER — Other Ambulatory Visit: Payer: Self-pay

## 2020-05-15 MED ORDER — VALSARTAN 160 MG PO TABS: 160.0000 mg | ORAL_TABLET | Freq: Every day | ORAL | 0 refills | Status: DC

## 2020-05-21 NOTE — Progress Notes (Signed)
Name: Sarah Gordon   MRN: [id number]    DOB: Apr 04, 1950   Date:05/23/2020       Progress Note  Subjective  Chief Complaint  Follow up   HPI    HTN: bp is still elevated today,  she is compliant with medication, denies chest pain or palpitation. Since we added metoprolol heart rate has been at goal, we will add Losartan 100 mg daily and continue Metoprolol 25 mg XL  Ventricular flutter: seen by cardiologist, medication for thyroid adjusted and feeling well since. She states no recent episodes of palpitation . Doing well at this time, denies palpitation   Paresthesia on left maxillary area: comes and goes , no pain, no ear pain, no weakness. Mild symptoms, discussed possible trigeminal neuralgia. Discussed possible referral to neurologist if it gets worse  MDD: she is feeling better on duloxetine and denies.  Last visit she was noticing that she was feeling  numb, could not cry during a sad movie, she went down on Duloxetine dose from 60 mg to 30 mg, she states since COVID-19 she noticed some panic attacks, she has been taking a lavender capsules prn and seems to help. Episodes can last about 30 minutes.   DMII: doing well on life style modification. A1C at goal. Denies polyphagia, polydipsia or polyuria She has a history of neuropathy, very mild symptoms, she will schedule an eye exam. A1C is still at goal at 5.8 %, she is on Zetia for dyslipidemia. We will recheck labs today   DDD lumbar spine: seen at [facility] , had MRI done and showed herniated disc, she had steroid injections twice and seems to help, not having daily pain and very seldom radiates down her legs  She states she has been doing well lately, no longer taking Meloxicam or baclofen .  OA: knees and feet, also has FMS, she asked to renew her handicap form , she states does not use it every time, but when she has a flare and pain is more intense she needs to park car closer.   Asthma: she states no longer having a daily dry  cough, she states less than once a week. She has occasional SOB and wheezing a couple of times a month , she uses albuterol prn for that   Morbid Obesity: BMI above 40, weight is stable, she is trying to cut down on ice cream  Hypothyroidism: she is taking medication daily, denies hair loss, she has chronic dry skin and chronic constipation Unchanged   Patient Active Problem List   Diagnosis Date Noted  . Ventricular flutter ([place]) 12/06/2018  . MDD (major depressive disorder), recurrent episode, mild ([place]) 09/04/2018  . DDD (degenerative disc disease), cervical 01/23/2016  . Degenerative joint disease of left acromioclavicular joint 01/23/2016  . Recurrent oral herpes simplex infection 09/01/2015  . Irritable bowel syndrome with diarrhea 08/07/2015  . Statin intolerance 02/28/2015  . Asthma, intermittent 09/10/2014  . Controlled type 2 diabetes with neuropathy ([place]) 09/10/2014  . Fibromyalgia 09/10/2014  . Obstructive sleep apnea 09/10/2014  . Hyperlipidemia 12/29/2012  . Essential hypertension 12/29/2012  . Morbid obesity ([place]) 12/29/2012  . Arthritis due to gout 12/13/2007  . Acid reflux 10/24/2006  . Adult hypothyroidism 10/24/2006  . Localized osteoarthrosis 10/24/2006    Past Surgical History:  Procedure Laterality Date  . BREAST BIOPSY Left    benign  . COLONOSCOPY     Dr Alveta Heimlich  . COLONOSCOPY WITH PROPOFOL N/A 11/19/2015   Procedure: COLONOSCOPY WITH PROPOFOL;  Surgeon: Christene Lye, MD;  Location: [facility] ENDOSCOPY;  Service: Endoscopy;  Laterality: N/A;  . ESOPHAGOGASTRODUODENOSCOPY N/A 08/20/2014   Procedure: ESOPHAGOGASTRODUODENOSCOPY (EGD);  Surgeon: Christene Lye, MD;  Location: [facility] ENDOSCOPY;  Service: Endoscopy;  Laterality: N/A;  . FOOT SURGERY Left    heel spur  . HERNIA REPAIR     umbilical  . NASAL SINUS SURGERY  1980s  . UMBILICAL HERNIA REPAIR      Family History  Problem Relation Age of Onset  . Hypertension Mother   . Hyperlipidemia  Mother   . Diabetes Mother   . Heart disease Mother   . Stroke Mother   . Heart disease Brother 32       CABG x 3   . Heart attack Brother 71  . Lupus Sister   . Arthritis Sister   . Asthma Sister   . Depression Sister   . Diabetes Brother   . Hypertension Brother   . Obesity Brother   . Early death Brother   . Miscarriages / Stillbirths Sister   . Stroke Maternal Grandfather   . Breast cancer Neg Hx     Social History   Tobacco Use  . Smoking status: Never Smoker  . Smokeless tobacco: Never Used  Substance Use Topics  . Alcohol use: No    Alcohol/week: 0.0 standard drinks    Comment: RARELY     Current Outpatient Medications:  .  DULoxetine (CYMBALTA) 30 MG capsule, Take 1 capsule (30 mg total) by mouth daily., Disp: 90 capsule, Rfl: 0 .  ezetimibe (ZETIA) 10 MG tablet, Take 1 tablet (10 mg total) by mouth daily., Disp: 90 tablet, Rfl: 1 .  fluticasone (FLOVENT HFA) 110 MCG/ACT inhaler, Inhale 2 puffs into the lungs 2 (two) times daily., Disp: , Rfl:  .  levothyroxine (SYNTHROID) 100 MCG tablet, Take 1 tablet (100 mcg total) by mouth daily., Disp: 90 tablet, Rfl: 0 .  metoprolol succinate (TOPROL-XL) 25 MG 24 hr tablet, Take 1 tablet (25 mg total) by mouth daily., Disp: 90 tablet, Rfl: 0 .  Multiple Vitamins-Minerals (HAIR SKIN NAILS PO), Take 1 tablet by mouth daily., Disp: , Rfl:  .  omeprazole (PRILOSEC) 20 MG capsule, Take 1 capsule (20 mg total) by mouth daily., Disp: 180 capsule, Rfl: 1 .  valsartan (DIOVAN) 160 MG tablet, Take 1 tablet (160 mg total) by mouth daily. In place of losartan due to drug shortage, Disp: 90 tablet, Rfl: 0  Allergies  Allergen Reactions  . Aspir-81 [Aspirin] Other (See Comments)    Coagulation disorder  . Lipitor [Atorvastatin] Other (See Comments)    Joint pain  . Statins Other (See Comments)  . Topamax [Topiramate] Other (See Comments)    Cognitive issues   . Vascepa [Icosapent Ethyl] Swelling and Other (See Comments)  . Voltaren  [Diclofenac Sodium] Hives and Other (See Comments)    I personally reviewed active problem list, medication list, allergies, family history, social history, health maintenance with the patient/caregiver today.   ROS  Constitutional: Negative for fever or weight change.  Respiratory: Negative for cough and shortness of breath.   Cardiovascular: Negative for chest pain or palpitations.  Gastrointestinal: Negative for abdominal pain, no bowel changes.  Musculoskeletal: Positive  for gait problem and has intermittent left  joint swelling.  Skin: Negative for rash.  Neurological: Negative for dizziness or headache.  No other specific complaints in a complete review of systems (except as listed in HPI above).  Objective  Vitals:  05/23/20 0925  BP: 140/84  Pulse: 81  Resp: 16  Temp: 98.3 F (36.8 C)  TempSrc: Oral  SpO2: 98%  Weight: 272 lb 4.8 oz (123.5 kg)  Height: 5\' 6"  (1.676 m)    Body mass index is 43.95 kg/m.  Physical Exam  Constitutional: Patient appears well-developed and well-nourished. Obese  No distress.  HEENT: head atraumatic, normocephalic, pupils equal and reactive to light,neck supple Cardiovascular: Normal rate, regular rhythm and normal heart sounds.  No murmur heard. No BLE edema. Pulmonary/Chest: Effort normal and breath sounds normal. No respiratory distress. Abdominal: Soft.  There is no tenderness. Muscular Skeletal: crepitus with extension of both knees  Psychiatric: Patient has a normal mood and affect. behavior is normal. Judgment and thought content normal.  Recent Results (from the past 2160 hour(s))  POCT HgB A1C     Status: Abnormal   Collection Time: 05/23/20  9:35 AM  Result Value Ref Range   Hemoglobin A1C 5.9 (A) 4.0 - 5.6 %   HbA1c POC (<> result, manual entry)     HbA1c, POC (prediabetic range)     HbA1c, POC (controlled diabetic range)        PHQ2/9: Depression screen [facility] 2/9 05/23/2020 01/22/2020 11/02/2019 10/17/2019 08/29/2019   Decreased Interest 0 0 0 0 0  Down, Depressed, Hopeless 0 1 1 0 0  PHQ - 2 Score 0 1 1 0 0  Altered sleeping 0 2 - 0 1  Tired, decreased energy 1 2 - 0 0  Change in appetite 1 1 - 0 0  Feeling bad or failure about yourself  0 0 - 0 0  Trouble concentrating 0 0 - 0 1  Moving slowly or fidgety/restless 0 0 - 0 0  Suicidal thoughts 0 0 - 0 0  PHQ-9 Score 2 6 - 0 2  Difficult doing work/chores - Not difficult at all - - Not difficult at all  Some recent data might be hidden    phq 9 is negative   Fall Risk: Fall Risk  05/23/2020 01/22/2020 11/02/2019 10/17/2019 08/29/2019  Falls in the past year? 1 0 0 0 1  Number falls in past yr: 0 0 - 0 0  Injury with Fall? 0 0 - 0 0  Risk for fall due to : - - - - -  Follow up - - Falls prevention discussed - -     Functional Status Survey: Is the patient deaf or have difficulty hearing?: No Does the patient have difficulty seeing, even when wearing glasses/contacts?: No Does the patient have difficulty concentrating, remembering, or making decisions?: Yes Does the patient have difficulty walking or climbing stairs?: Yes Does the patient have difficulty dressing or bathing?: No Does the patient have difficulty doing errands alone such as visiting a doctor's office or shopping?: No    Assessment & Plan  1. Controlled type 2 diabetes with neuropathy (HCC)  - POCT HgB A1C  2. Breast cancer screening by mammogram  - MM Digital Screening; Future  3. Gastroesophageal reflux disease without esophagitis  - omeprazole (PRILOSEC) 20 MG capsule; Take 1 capsule (20 mg total) by mouth daily.  Dispense: 90 capsule; Refill: 0  4. OSA (obstructive sleep apnea)  Cannot tolerate it   5. Morbid obesity ([place])  Discussed with the patient the risk posed by an increased BMI. Discussed importance of portion control, calorie counting and at least 150 minutes of physical activity weekly. Avoid sweet beverages and drink more water. Eat at least 6 servings  of  fruit and vegetables daily   6. Dyslipidemia associated with type 2 diabetes mellitus (HCC)  - ezetimibe (ZETIA) 10 MG tablet; Take 1 tablet (10 mg total) by mouth daily.  Dispense: 90 tablet; Refill: 0 - Lipid panel  7. Essential hypertension  - valsartan (DIOVAN) 160 MG tablet; Take 1 tablet (160 mg total) by mouth daily. In place of losartan due to drug shortage  Dispense: 90 tablet; Refill: 0 - COMPLETE METABOLIC PANEL WITH GFR  8. Ventricular flutter (HCC)  - metoprolol succinate (TOPROL-XL) 25 MG 24 hr tablet; Take 1 tablet (25 mg total) by mouth daily.  Dispense: 90 tablet; Refill: 0  9. Other specified hypothyroidism  - levothyroxine (SYNTHROID) 100 MCG tablet; Take 1 tablet (100 mcg total) by mouth daily.  Dispense: 90 tablet; Refill: 0 - TSH  10. MDD (recurrent major depressive disorder) in remission (HCC)  - DULoxetine (CYMBALTA) 30 MG capsule; Take 1 capsule (30 mg total) by mouth daily.  Dispense: 90 capsule; Refill: 0  11. Moderate persistent asthma without complication   12. Mild intermittent asthma without complication  - montelukast (SINGULAIR) 10 MG tablet; Take 1 tablet (10 mg total) by mouth at bedtime.  Dispense: 90 tablet; Refill: 0 - albuterol (VENTOLIN HFA) 108 (90 Base) MCG/ACT inhaler; Inhale 2 puffs into the lungs every 6 (six) hours as needed for wheezing or shortness of breath.  Dispense: 1 each; Refill: 2  13. Nocturnal leg cramps  - CBC with Differential/Platelet - Magnesium  14. Panic attack  - hydrOXYzine (ATARAX/VISTARIL) 10 MG tablet; Take 1 tablet (10 mg total) by mouth 3 (three) times daily as needed.  Dispense: 30 tablet; Refill: 0

## 2020-05-22 ENCOUNTER — Other Ambulatory Visit: Payer: Self-pay

## 2020-05-22 ENCOUNTER — Ambulatory Visit
Admission: RE | Admit: 2020-05-22 | Discharge: 2020-05-22 | Disposition: A | Discharge status: Home / Self Care | Payer: Medicare HMO | Source: Ambulatory Visit | Attending: Family Medicine | Admitting: Family Medicine

## 2020-05-22 DIAGNOSIS — Z1231 Encounter for screening mammogram for malignant neoplasm of breast: Secondary | ICD-10-CM | POA: Insufficient documentation

## 2020-05-23 ENCOUNTER — Encounter: Payer: Self-pay | Admitting: Family Medicine

## 2020-05-23 ENCOUNTER — Ambulatory Visit (INDEPENDENT_AMBULATORY_CARE_PROVIDER_SITE_OTHER): Payer: Medicare HMO | Admitting: Family Medicine

## 2020-05-23 VITALS — BP 140/84 | HR 81 | Temp 98.3°F | Resp 16 | Ht 66.0 in | Wt 272.3 lb

## 2020-05-23 DIAGNOSIS — G4762 Sleep related leg cramps: Secondary | ICD-10-CM | POA: Diagnosis not present

## 2020-05-23 DIAGNOSIS — Z1231 Encounter for screening mammogram for malignant neoplasm of breast: Secondary | ICD-10-CM | POA: Diagnosis not present

## 2020-05-23 DIAGNOSIS — E1140 Type 2 diabetes mellitus with diabetic neuropathy, unspecified: Secondary | ICD-10-CM

## 2020-05-23 DIAGNOSIS — F410 Panic disorder [episodic paroxysmal anxiety] without agoraphobia: Secondary | ICD-10-CM

## 2020-05-23 DIAGNOSIS — K219 Gastro-esophageal reflux disease without esophagitis: Secondary | ICD-10-CM

## 2020-05-23 DIAGNOSIS — R69 Illness, unspecified: Secondary | ICD-10-CM | POA: Diagnosis not present

## 2020-05-23 DIAGNOSIS — J4540 Moderate persistent asthma, uncomplicated: Secondary | ICD-10-CM

## 2020-05-23 DIAGNOSIS — I10 Essential (primary) hypertension: Secondary | ICD-10-CM | POA: Diagnosis not present

## 2020-05-23 DIAGNOSIS — E038 Other specified hypothyroidism: Secondary | ICD-10-CM | POA: Diagnosis not present

## 2020-05-23 DIAGNOSIS — J4520 Mild intermittent asthma, uncomplicated: Secondary | ICD-10-CM

## 2020-05-23 DIAGNOSIS — G4733 Obstructive sleep apnea (adult) (pediatric): Secondary | ICD-10-CM | POA: Diagnosis not present

## 2020-05-23 DIAGNOSIS — F3340 Major depressive disorder, recurrent, in remission, unspecified: Secondary | ICD-10-CM

## 2020-05-23 DIAGNOSIS — E785 Hyperlipidemia, unspecified: Secondary | ICD-10-CM

## 2020-05-23 DIAGNOSIS — E1169 Type 2 diabetes mellitus with other specified complication: Secondary | ICD-10-CM | POA: Diagnosis not present

## 2020-05-23 DIAGNOSIS — I4902 Ventricular flutter: Secondary | ICD-10-CM | POA: Diagnosis not present

## 2020-05-23 LAB — POCT GLYCOSYLATED HEMOGLOBIN (HGB A1C): Hemoglobin A1C: 5.9 % — AB (ref 4.0–5.6)

## 2020-05-23 MED ORDER — METOPROLOL SUCCINATE ER 25 MG PO TB24
25.0000 mg | ORAL_TABLET | Freq: Every day | ORAL | 0 refills | Status: DC
Start: 2020-05-23 — End: 2020-08-10

## 2020-05-23 MED ORDER — VALSARTAN 160 MG PO TABS
160.0000 mg | ORAL_TABLET | Freq: Every day | ORAL | 0 refills | Status: DC
Start: 2020-05-23 — End: 2020-08-10

## 2020-05-23 MED ORDER — MONTELUKAST SODIUM 10 MG PO TABS: 10.0000 mg | ORAL_TABLET | Freq: Every day | ORAL | 0 refills | Status: DC

## 2020-05-23 MED ORDER — OMEPRAZOLE 20 MG PO CPDR: 20.0000 mg | DELAYED_RELEASE_CAPSULE | Freq: Every day | ORAL | 0 refills | Status: DC

## 2020-05-23 MED ORDER — DULOXETINE HCL 30 MG PO CPEP: 30.0000 mg | ORAL_CAPSULE | Freq: Every day | ORAL | 0 refills | Status: DC

## 2020-05-23 MED ORDER — EZETIMIBE 10 MG PO TABS: 10.0000 mg | ORAL_TABLET | Freq: Every day | ORAL | 0 refills | Status: DC

## 2020-05-23 MED ORDER — ALBUTEROL SULFATE HFA 108 (90 BASE) MCG/ACT IN AERS: 2.0000 | INHALATION_SPRAY | Freq: Four times a day (QID) | RESPIRATORY_TRACT | 2 refills | Status: DC | PRN

## 2020-05-23 MED ORDER — LEVOTHYROXINE SODIUM 100 MCG PO TABS
100.0000 ug | ORAL_TABLET | Freq: Every day | ORAL | 0 refills | Status: DC
Start: 2020-05-23 — End: 2020-08-10

## 2020-05-23 MED ORDER — HYDROXYZINE HCL 10 MG PO TABS: 10.0000 mg | ORAL_TABLET | Freq: Three times a day (TID) | ORAL | 0 refills | Status: DC | PRN

## 2020-05-24 LAB — COMPLETE METABOLIC PANEL WITH GFR
AG Ratio: 1.6 (calc) (ref 1.0–2.5)
ALT: 20 U/L (ref 6–29)
AST: 17 U/L (ref 10–35)
Albumin: 3.8 g/dL (ref 3.6–5.1)
Alkaline phosphatase (APISO): 72 U/L (ref 37–153)
BUN/Creatinine Ratio: 35 (calc) — ABNORMAL HIGH (ref 6–22)
BUN: 30 mg/dL — ABNORMAL HIGH (ref 7–25)
CO2: 25 mmol/L (ref 20–32)
Calcium: 9.2 mg/dL (ref 8.6–10.4)
Chloride: 107 mmol/L (ref 98–110)
Creat: 0.86 mg/dL (ref 0.50–0.99)
GFR, Est African American: 81 mL/min/{1.73_m2} (ref >=60)
GFR, Est Non African American: 70 mL/min/{1.73_m2} (ref >=60)
Globulin: 2.4 g/dL (calc) (ref 1.9–3.7)
Glucose, Bld: 97 mg/dL (ref 65–99)
Potassium: 4.4 mmol/L (ref 3.5–5.3)
Sodium: 142 mmol/L (ref 135–146)
Total Bilirubin: 0.5 mg/dL (ref 0.2–1.2)
Total Protein: 6.2 g/dL (ref 6.1–8.1)

## 2020-05-24 LAB — CBC WITH DIFFERENTIAL/PLATELET
Absolute Monocytes: 291 cells/uL (ref 200–950)
Basophils Absolute: 42 cells/uL (ref 0–200)
Basophils Relative: 0.9 %
Eosinophils Absolute: 141 cells/uL (ref 15–500)
Eosinophils Relative: 3 %
HCT: 38.3 % (ref 35.0–45.0)
Hemoglobin: 12.7 g/dL (ref 11.7–15.5)
Lymphs Abs: 860 cells/uL (ref 850–3900)
MCH: 31.2 pg (ref 27.0–33.0)
MCHC: 33.2 g/dL (ref 32.0–36.0)
MCV: 94.1 fL (ref 80.0–100.0)
MPV: 10.1 fL (ref 7.5–12.5)
Monocytes Relative: 6.2 %
Neutro Abs: 3365 cells/uL (ref 1500–7800)
Neutrophils Relative %: 71.6 %
Platelets: 257 10*3/uL (ref 140–400)
RBC: 4.07 10*6/uL (ref 3.80–5.10)
RDW: 13.3 % (ref 11.0–15.0)
Total Lymphocyte: 18.3 %
WBC: 4.7 10*3/uL (ref 3.8–10.8)

## 2020-05-24 LAB — LIPID PANEL
Cholesterol: 232 mg/dL — ABNORMAL HIGH (ref <200)
HDL: 46 mg/dL — ABNORMAL LOW (ref >=50)
LDL Cholesterol (Calc): 160 mg/dL (calc) — ABNORMAL HIGH
Non-HDL Cholesterol (Calc): 186 mg/dL (calc) — ABNORMAL HIGH (ref <130)
Total CHOL/HDL Ratio: 5 (calc) — ABNORMAL HIGH (ref <5.0)
Triglycerides: 134 mg/dL (ref <150)

## 2020-05-24 LAB — MAGNESIUM: Magnesium: 1.8 mg/dL (ref 1.5–2.5)

## 2020-05-24 LAB — TSH: TSH: 2.49 mIU/L (ref 0.40–4.50)

## 2020-05-27 ENCOUNTER — Ambulatory Visit (INDEPENDENT_AMBULATORY_CARE_PROVIDER_SITE_OTHER): Payer: Medicare HMO

## 2020-05-27 DIAGNOSIS — Z780 Asymptomatic menopausal state: Secondary | ICD-10-CM | POA: Diagnosis not present

## 2020-05-27 DIAGNOSIS — Z0000 Encounter for general adult medical examination without abnormal findings: Secondary | ICD-10-CM

## 2020-05-27 NOTE — Patient Instructions (Signed)
Sarah Gordon , Thank you for taking time to come for your Medicare Wellness Visit. I appreciate your ongoing commitment to your health goals. Please review the following plan we discussed and let me know if I can assist you in the future.   Screening recommendations/referrals: Colonoscopy: done 11/19/15. Repeat in 2027.  Mammogram: done 05/22/20 Bone Density: done 08/20/14 Recommended yearly ophthalmology/optometry visit for glaucoma screening and checkup Recommended yearly dental visit for hygiene and checkup  Vaccinations: Influenza vaccine: done 01/22/20 Pneumococcal vaccine: done 02/27/18 Tdap vaccine: done 10/12/11 Shingles vaccine: Shingrix discussed. Please contact your pharmacy for coverage information.  Covid-19: done 04/26/19, 05/22/19 & 12/24/19  Advanced directives: Advance directive discussed with you today. I have provided a copy for you to complete at home and have notarized. Once this is complete please bring a copy in to our office so we can scan it into your chart.  Conditions/risks identified: Recommend increasing physical activity   Next appointment: Follow up in one year for your annual wellness visit    Preventive Care 65 Years and Older, Female Preventive care refers to lifestyle choices and visits with your health care provider that can promote health and wellness. What does preventive care include?  A yearly physical exam. This is also called an annual well check.  Dental exams once or twice a year.  Routine eye exams. Ask your health care provider how often you should have your eyes checked.  Personal lifestyle choices, including:  Daily care of your teeth and gums.  Regular physical activity.  Eating a healthy diet.  Avoiding tobacco and drug use.  Limiting alcohol use.  Practicing safe sex.  Taking low-dose aspirin every day.  Taking vitamin and mineral supplements as recommended by your health care provider. What happens during an annual well  check? The services and screenings done by your health care provider during your annual well check will depend on your age, overall health, lifestyle risk factors, and family history of disease. Counseling  Your health care provider may ask you questions about your:  Alcohol use.  Tobacco use.  Drug use.  Emotional well-being.  Home and relationship well-being.  Sexual activity.  Eating habits.  History of falls.  Memory and ability to understand (cognition).  Work and work Statistician.  Reproductive health. Screening  You may have the following tests or measurements:  Height, weight, and BMI.  Blood pressure.  Lipid and cholesterol levels. These may be checked every 5 years, or more frequently if you are over 85 years old.  Skin check.  Lung cancer screening. You may have this screening every year starting at age 58 if you have a 30-pack-year history of smoking and currently smoke or have quit within the past 15 years.  Fecal occult blood test (FOBT) of the stool. You may have this test every year starting at age 80.  Flexible sigmoidoscopy or colonoscopy. You may have a sigmoidoscopy every 5 years or a colonoscopy every 10 years starting at age 36.  Hepatitis C blood test.  Hepatitis B blood test.  Sexually transmitted disease (STD) testing.  Diabetes screening. This is done by checking your blood sugar (glucose) after you have not eaten for a while (fasting). You may have this done every 1-3 years.  Bone density scan. This is done to screen for osteoporosis. You may have this done starting at age 14.  Mammogram. This may be done every 1-2 years. Talk to your health care provider about how often you should have regular  mammograms. Talk with your health care provider about your test results, treatment options, and if necessary, the need for more tests. Vaccines  Your health care provider may recommend certain vaccines, such as:  Influenza vaccine. This is  recommended every year.  Tetanus, diphtheria, and acellular pertussis (Tdap, Td) vaccine. You may need a Td booster every 10 years.  Zoster vaccine. You may need this after age 62.  Pneumococcal 13-valent conjugate (PCV13) vaccine. One dose is recommended after age 65.  Pneumococcal polysaccharide (PPSV23) vaccine. One dose is recommended after age 82. Talk to your health care provider about which screenings and vaccines you need and how often you need them. This information is not intended to replace advice given to you by your health care provider. Make sure you discuss any questions you have with your health care provider. Document Released: 04/04/2015 Document Revised: 11/26/2015 Document Reviewed: 01/07/2015 Elsevier Interactive Patient Education  2017 [place] Prevention in the Home Falls can cause injuries. They can happen to people of all ages. There are many things you can do to make your home safe and to help prevent falls. What can I do on the outside of my home?  Regularly fix the edges of walkways and driveways and fix any cracks.  Remove anything that might make you trip as you walk through a door, such as a raised step or threshold.  Trim any bushes or trees on the path to your home.  Use bright outdoor lighting.  Clear any walking paths of anything that might make someone trip, such as rocks or tools.  Regularly check to see if handrails are loose or broken. Make sure that both sides of any steps have handrails.  Any raised decks and porches should have guardrails on the edges.  Have any leaves, snow, or ice cleared regularly.  Use sand or salt on walking paths during winter.  Clean up any spills in your garage right away. This includes oil or grease spills. What can I do in the bathroom?  Use night lights.  Install grab bars by the toilet and in the tub and shower. Do not use towel bars as grab bars.  Use non-skid mats or decals in the tub or  shower.  If you need to sit down in the shower, use a plastic, non-slip stool.  Keep the floor dry. Clean up any water that spills on the floor as soon as it happens.  Remove soap buildup in the tub or shower regularly.  Attach bath mats securely with double-sided non-slip rug tape.  Do not have throw rugs and other things on the floor that can make you trip. What can I do in the bedroom?  Use night lights.  Make sure that you have a light by your bed that is easy to reach.  Do not use any sheets or blankets that are too big for your bed. They should not hang down onto the floor.  Have a firm chair that has side arms. You can use this for support while you get dressed.  Do not have throw rugs and other things on the floor that can make you trip. What can I do in the kitchen?  Clean up any spills right away.  Avoid walking on wet floors.  Keep items that you use a lot in easy-to-reach places.  If you need to reach something above you, use a strong step stool that has a grab bar.  Keep electrical cords out of the way.  Do not use floor polish or wax that makes floors slippery. If you must use wax, use non-skid floor wax.  Do not have throw rugs and other things on the floor that can make you trip. What can I do with my stairs?  Do not leave any items on the stairs.  Make sure that there are handrails on both sides of the stairs and use them. Fix handrails that are broken or loose. Make sure that handrails are as long as the stairways.  Check any carpeting to make sure that it is firmly attached to the stairs. Fix any carpet that is loose or worn.  Avoid having throw rugs at the top or bottom of the stairs. If you do have throw rugs, attach them to the floor with carpet tape.  Make sure that you have a light switch at the top of the stairs and the bottom of the stairs. If you do not have them, ask someone to add them for you. What else can I do to help prevent  falls?  Wear shoes that:  Do not have high heels.  Have rubber bottoms.  Are comfortable and fit you well.  Are closed at the toe. Do not wear sandals.  If you use a stepladder:  Make sure that it is fully opened. Do not climb a closed stepladder.  Make sure that both sides of the stepladder are locked into place.  Ask someone to hold it for you, if possible.  Clearly mark and make sure that you can see:  Any grab bars or handrails.  First and last steps.  Where the edge of each step is.  Use tools that help you move around (mobility aids) if they are needed. These include:  Canes.  Walkers.  Scooters.  Crutches.  Turn on the lights when you go into a dark area. Replace any light bulbs as soon as they burn out.  Set up your furniture so you have a clear path. Avoid moving your furniture around.  If any of your floors are uneven, fix them.  If there are any pets around you, be aware of where they are.  Review your medicines with your doctor. Some medicines can make you feel dizzy. This can increase your chance of falling. Ask your doctor what other things that you can do to help prevent falls. This information is not intended to replace advice given to you by your health care provider. Make sure you discuss any questions you have with your health care provider. Document Released: 01/02/2009 Document Revised: 08/14/2015 Document Reviewed: 04/12/2014 Elsevier Interactive Patient Education  2017 [employer].

## 2020-05-27 NOTE — Progress Notes (Signed)
Subjective:   Sarah Gordon is a 70 y.o. female who presents for Medicare Annual (Subsequent) preventive examination.  Virtual Visit via Video Note  I connected with  Sarah Gordon on 05/27/20 at  9:20 AM EST via telehealth video enabled device and verified that I am speaking with the correct person using two identifiers.  Location: Patient: home Provider: [place] Persons participating in the virtual visit: [name]   I discussed the limitations, risks, security and privacy concerns of performing an evaluation and management service by telephone and the availability of in person appointments. The patient expressed understanding and agreed to proceed.  Some vital signs may be absent or patient reported.   Sarah Marker, LPN    Review of Systems     Cardiac Risk Factors include: advanced age (>56men, >25 women);diabetes mellitus;dyslipidemia;hypertension;obesity (BMI >30kg/m2)     Objective:    There were no vitals filed for this visit. There is no height or weight on file to calculate BMI.  Advanced Directives 05/27/2020 05/24/2019 02/25/2017 05/06/2016 01/23/2016 11/17/2015 09/01/2015  Does Patient Have a Medical Advance Directive? No No No No No No No  Would patient like information on creating a medical advance directive? Yes (MAU/Ambulatory/Procedural Areas - Information given) Yes (MAU/Ambulatory/Procedural Areas - Information given) No - Patient declined - No - patient declined information No - patient declined information No - patient declined information    Current Medications (verified) Outpatient Encounter Medications as of 05/27/2020  Medication Sig  . albuterol (VENTOLIN HFA) 108 (90 Base) MCG/ACT inhaler Inhale 2 puffs into the lungs every 6 (six) hours as needed for wheezing or shortness of breath.  . DULoxetine (CYMBALTA) 30 MG capsule Take 1 capsule (30 mg total) by mouth daily.  Marland Kitchen ezetimibe (ZETIA) 10 MG tablet Take 1 tablet (10 mg total) by mouth  daily.  Marland Kitchen levothyroxine (SYNTHROID) 100 MCG tablet Take 1 tablet (100 mcg total) by mouth daily.  . metoprolol succinate (TOPROL-XL) 25 MG 24 hr tablet Take 1 tablet (25 mg total) by mouth daily.  . montelukast (SINGULAIR) 10 MG tablet Take 1 tablet (10 mg total) by mouth at bedtime.  Marland Kitchen omeprazole (PRILOSEC) 20 MG capsule Take 1 capsule (20 mg total) by mouth daily.  . valsartan (DIOVAN) 160 MG tablet Take 1 tablet (160 mg total) by mouth daily. In place of losartan due to drug shortage  . hydrOXYzine (ATARAX/VISTARIL) 10 MG tablet Take 1 tablet (10 mg total) by mouth 3 (three) times daily as needed.  . Multiple Vitamins-Minerals (HAIR SKIN NAILS PO) Take 1 tablet by mouth daily. (Patient not taking: Reported on 05/27/2020)  . [DISCONTINUED] Fluticasone-Salmeterol (ADVAIR DISKUS) 250-50 MCG/DOSE AEPB Inhale 1 puff into the lungs 2 (two) times daily. (Patient not taking: Reported on 05/24/2019)   No facility-administered encounter medications on file as of 05/27/2020.    Allergies (verified) Aspir-81 [aspirin], Lipitor [atorvastatin], Statins, Topamax [topiramate], Vascepa [icosapent ethyl], and Voltaren [diclofenac sodium]   History: Past Medical History:  Diagnosis Date  . Allergy   . Asthma   . Cataract   . Esophageal reflux   . Gouty arthropathy, unspecified   . Herpes simplex without mention of complication   . Irritable bowel syndrome   . Localized osteoarthrosis not specified whether primary or secondary, unspecified site   . Migraine, unspecified, without mention of intractable migraine without mention of status migrainosus   . Mixed hyperlipidemia   . Myalgia and myositis, unspecified   . Obstructive sleep apnea (adult) (pediatric)   .  Palpitations   . Type II or unspecified type diabetes mellitus without mention of complication, not stated as uncontrolled   . Umbilical hernia without mention of obstruction or gangrene   . Unspecified essential hypertension   . Unspecified  hypothyroidism    Past Surgical History:  Procedure Laterality Date  . BREAST BIOPSY Left    benign  . COLONOSCOPY     Dr Alveta Heimlich  . COLONOSCOPY WITH PROPOFOL N/A 11/19/2015   Procedure: COLONOSCOPY WITH PROPOFOL;  Surgeon: Christene Lye, MD;  Location: [facility] ENDOSCOPY;  Service: Endoscopy;  Laterality: N/A;  . ESOPHAGOGASTRODUODENOSCOPY N/A 08/20/2014   Procedure: ESOPHAGOGASTRODUODENOSCOPY (EGD);  Surgeon: Christene Lye, MD;  Location: [facility] ENDOSCOPY;  Service: Endoscopy;  Laterality: N/A;  . FOOT SURGERY Left    heel spur  . HERNIA REPAIR     umbilical  . NASAL SINUS SURGERY  1980s  . UMBILICAL HERNIA REPAIR     Family History  Problem Relation Age of Onset  . Hypertension Mother   . Hyperlipidemia Mother   . Diabetes Mother   . Heart disease Mother   . Stroke Mother   . Heart disease Brother 32       CABG x 3   . Heart attack Brother 19  . Lupus Sister   . Arthritis Sister   . Asthma Sister   . Depression Sister   . Diabetes Brother   . Hypertension Brother   . Obesity Brother   . Early death Brother   . Miscarriages / Stillbirths Sister   . Stroke Maternal Grandfather   . Breast cancer Neg Hx    Social History   Socioeconomic History  . Marital status: Married    Spouse name: Sarah Gordon   . Number of children: 1  . Years of education: Not on file  . Highest education level: Some college, no degree  Occupational History    Comment: works part time at [employer]  . Smoking status: Never Smoker  . Smokeless tobacco: Never Used  Vaping Use  . Vaping Use: Never used  Substance and Sexual Activity  . Alcohol use: No    Alcohol/week: 0.0 standard drinks    Comment: RARELY  . Drug use: No  . Sexual activity: Never  Other Topics Concern  . Not on file  Social History Narrative  . Not on file   Social Determinants of Health   Financial Resource Strain: Low Risk   . Difficulty of Paying Living Expenses: Not very hard  Food Insecurity: No  Food Insecurity  . Worried About Charity fundraiser in the Last Year: Never true  . Ran Out of Food in the Last Year: Never true  Transportation Needs: No Transportation Needs  . Lack of Transportation (Medical): No  . Lack of Transportation (Non-Medical): No  Physical Activity: Inactive  . Days of Exercise per Week: 0 days  . Minutes of Exercise per Session: 0 min  Stress: No Stress Concern Present  . Feeling of Stress : Only a little  Social Connections: Socially Integrated  . Frequency of Communication with Friends and Family: More than three times a week  . Frequency of Social Gatherings with Friends and Family: More than three times a week  . Attends Religious Services: More than 4 times per year  . Active Member of Clubs or Organizations: Yes  . Attends Archivist Meetings: More than 4 times per year  . Marital Status: Married    Tobacco Counseling Counseling  given: Not Answered   Clinical Intake:  Pre-visit preparation completed: Yes  Pain : No/denies pain     Nutritional Risks: None Diabetes: Yes CBG done?: No Did pt. bring in CBG monitor from home?: No   Nutrition Risk Assessment:  Has the patient had any N/V/D within the last 2 months?  No  Does the patient have any non-healing wounds?  No  Has the patient had any unintentional weight loss or weight gain?  No   Diabetes:  Is the patient diabetic?  Yes  If diabetic, was a CBG obtained today?  No  Did the patient bring in their glucometer from home?  No  How often do you monitor your CBG's? Pt does not actively check blood sugar; well controlled.   Financial Strains and Diabetes Management:  Are you having any financial strains with the device, your supplies or your medication? No .  Does the patient want to be seen by Chronic Care Management for management of their diabetes?  No  Would the patient like to be referred to a Nutritionist or for Diabetic Management?  No   Diabetic  Exams:  Diabetic Eye Exam: Overdue for diabetic eye exam. Pt has been advised about the importance in completing this exam.   Diabetic Foot Exam: Completed 01/22/20.   How often do you need to have someone help you when you read instructions, pamphlets, or other written materials from your doctor or pharmacy?: 1 - Never    Interpreter Needed?: No  Information entered by :: Sarah Marker LPN   Activities of Daily Living In your present state of health, do you have any difficulty performing the following activities: 05/27/2020 05/23/2020  Hearing? N N  Comment declines hearing aids -  Vision? N N  Difficulty concentrating or making decisions? Y Y  Comment concentration; easily distracted -  Walking or climbing stairs? Y Y  Dressing or bathing? N N  Doing errands, shopping? N N  Preparing Food and eating ? N -  Using the Toilet? N -  In the past six months, have you accidently leaked urine? N -  Do you have problems with loss of bowel control? N -  Managing your Medications? N -  Managing your Finances? N -  Housekeeping or managing your Housekeeping? N -  Some recent data might be hidden    Patient Care Team: Steele Sizer, MD as PCP - General (Family Medicine) Pa, Borden as Consulting Physician (Optometry) Neldon Labella, RN as Case Manager  Indicate any recent Medical Services you may have received from other than [facility] providers in the past year (date may be approximate).     Assessment:   This is a routine wellness examination for [place].  Hearing/Vision screen  Hearing Screening   125Hz  250Hz  500Hz  1000Hz  2000Hz  3000Hz  4000Hz  6000Hz  8000Hz   Right ear:           Left ear:           Comments: Pt denies hearing difficulty  Vision Screening Comments: Annual vision screenings done at [facility]; due for exam  Dietary issues and exercise activities discussed: Current Exercise Habits: The patient does not participate in regular exercise at present,  Exercise limited by: None identified  Goals    . Chronic Disease Management     CARE PLAN ENTRY (see longitudinal plan of care for additional care plan information)  Current Barriers:  . Chronic Disease Management support and education needs related to Hypertension, Diabetes, Asthma and  Hyperlipidemia  Case Manager Clinical Goal(s):  Over the next 120 days, patient will: . Attend medical appointments as scheduled. . Take medications as prescribed.-Complete . Monitor blood pressure and record readings. . Adhere to recommended cardiac prudent/diabetic diet.  . Follow recommended safety measures to prevent falls and injuries. Over the next 30 days, patient will: . Update care management team if additional caregiver resources are needed.-Complete . Follow-up with PCP regarding effectiveness of duloxetine dose.-Complete  Interventions:  . Inter-disciplinary care team collaboration (see longitudinal plan of care) . Reviewed medications and treatment recommendations. Reports taking medications as prescribed. During our previous outreach she expressed concerns related to depression and her duloxetine dose was increased. She reports significant improvement and feels that her symptoms are well controlled with the current dose. Also discussed plan regarding medications for hyperlipidemia. Previous plan was to start Repatha injections however there were concerns regarding the costs. Pending feedback from her provider regarding plan. Agreed to outreach with the CCM Pharmacist for medication assistance if Repatha is prescribed.  . Reviewed recent BP readings.  BP not at goal. Reports several elevated readings. Denies chest discomfort, headaches, dizziness or visual changes. Encouraged to monitor daily and record readings. Reviewed parameters and indications for seeking medical attention.   . Reviewed activity tolerance. Reports feeling tired and fatigued over the past few weeks. States it's not related  to depression. Reports some episodes have been related to exertion. Denies changes with ambulation. No  falls. We discussed her current sleep hygiene. Reports occasionally having difficulty falling asleep but overall feels that she is able to rest well. We discussed worsening s/sx that require medical follow-up. Agreed to notify provider if symptoms persists.   . Discussed plan for care management follow up. Reports doing well. No urgent concerns or changes in care management needs. Will plan next outreach for December. Will follow-up sooner if referral for medication assistance is required.    Patient Self Care Activities:  . Self administers medications  . Attends scheduled provider appointments . Calls pharmacy for medication refills . Performs ADL's independently . Performs IADL's independently . Calls provider office for new concerns or questions   Please see past updates related to this goal by clicking on the "Past Updates" button in the selected goal      . DIET - INCREASE WATER INTAKE     Recommend drinking 6-8 glasses of water per day     . Weight (lb) < 240 lb (108.9 kg)     Pt would like to lose 20 lbs over the next year with healthy eating and exercise      Depression Screen PHQ 2/9 Scores 05/27/2020 05/23/2020 01/22/2020 11/02/2019 10/17/2019 08/29/2019 07/24/2019  PHQ - 2 Score 0 0 1 1 0 0 0  PHQ- 9 Score 2 2 6  - 0 2 0    Fall Risk Fall Risk  05/27/2020 05/23/2020 01/22/2020 11/02/2019 10/17/2019  Falls in the past year? 1 1 0 0 0  Number falls in past yr: 0 0 0 - 0  Injury with Fall? 0 0 0 - 0  Risk for fall due to : No Fall Risks - - - -  Follow up Falls prevention discussed - - Falls prevention discussed -    FALL RISK PREVENTION PERTAINING TO THE HOME:  Any stairs in or around the home? No  If so, are there any without handrails? No  Home free of loose throw rugs in walkways, pet beds, electrical cords, etc? Yes  Adequate lighting in  your home to reduce risk of falls? Yes    ASSISTIVE DEVICES UTILIZED TO PREVENT FALLS:  Life alert? No  Use of a cane, walker or w/c? No  Grab bars in the bathroom? Yes  Shower chair or bench in shower? No  Elevated toilet seat or a handicapped toilet? Yes   TIMED UP AND GO:  Was the test performed? No . Telephonic visit.   Cognitive Function: Normal cognitive status assessed by direct observation by this Nurse Health Advisor. No abnormalities found.       6CIT Screen 05/24/2019  What Year? 0 points  What month? 0 points  What time? 0 points  Count back from 20 0 points  Months in reverse 0 points  Repeat phrase 0 points  Total Score 0    Immunizations Immunization History  Administered Date(s) Administered  . Fluad Quad(high Dose 65+) 12/06/2018, 01/22/2020  . Hepatitis B, adult 02/27/2018  . Hepb-cpg 12/06/2018  . Influenza, High Dose Seasonal PF 02/27/2018  . PFIZER(Purple Top)SARS-COV-2 Vaccination 04/26/2019, 05/22/2019, 12/24/2019  . Pneumococcal Conjugate-13 07/30/2013  . Pneumococcal Polysaccharide-23 10/12/2011, 02/27/2018  . Tdap 10/12/2011  . Zoster 08/06/2010    TDAP status: Up to date  Flu Vaccine status: Up to date  Pneumococcal vaccine status: Up to date  Covid-19 vaccine status: Completed vaccines  Qualifies for Shingles Vaccine? Yes   Zostavax completed Yes   Shingrix Completed?: No.    Education has been provided regarding the importance of this vaccine. Patient has been advised to call insurance company to determine out of pocket expense if they have not yet received this vaccine. Advised may also receive vaccine at local pharmacy or Health Dept. Verbalized acceptance and understanding.  Screening Tests Health Maintenance  Topic Date Due  . OPHTHALMOLOGY EXAM  07/25/2019  . HEMOGLOBIN A1C  11/23/2020  . FOOT EXAM  01/21/2021  . TETANUS/TDAP  10/11/2021  . MAMMOGRAM  05/23/2022  . COLONOSCOPY (Pts 45-50yrs Insurance coverage will need to be confirmed)  11/18/2025  . INFLUENZA  VACCINE  Completed  . DEXA SCAN  Completed  . COVID-19 Vaccine  Completed  . Hepatitis C Screening  Completed  . PNA vac Low Risk Adult  Completed  . HPV VACCINES  Aged Out    Health Maintenance  Health Maintenance Due  Topic Date Due  . OPHTHALMOLOGY EXAM  07/25/2019    Colorectal cancer screening: Type of screening: Colonoscopy. Completed 11/19/15. Repeat every 10 years  Mammogram status: Completed 05/22/20. Repeat every year  Bone Density status: Completed 11/20/14. Results reflect: Bone density results: NORMAL. Repeat every 2 years.  Lung Cancer Screening: (Low Dose CT Chest recommended if Age 62-80 years, 30 pack-year currently smoking OR have quit w/in 15years.) does not qualify.   Additional Screening:  Hepatitis C Screening: does qualify; Completed 10/23/15  Vision Screening: Recommended annual ophthalmology exams for early detection of glaucoma and other disorders of the eye. Is the patient up to date with their annual eye exam?  No  Who is the provider or what is the name of the office in which the patient attends annual eye exams? Dr. Ellin Mayhew  Dental Screening: Recommended annual dental exams for proper oral hygiene  Community Resource Referral / Chronic Care Management: CRR required this visit?  No   CCM required this visit?  No      Plan:     I have personally reviewed and noted the following in the patient's chart:   . Medical and social history . Use of alcohol,  tobacco or illicit drugs  . Current medications and supplements . Functional ability and status . Nutritional status . Physical activity . Advanced directives . List of other physicians . Hospitalizations, surgeries, and ER visits in previous 12 months . Vitals . Screenings to include cognitive, depression, and falls . Referrals and appointments  In addition, I have reviewed and discussed with patient certain preventive protocols, quality metrics, and best practice recommendations. A written  personalized care plan for preventive services as well as general preventive health recommendations were provided to patient.     Sarah Marker, LPN   09/24/4490   Nurse Notes: none

## 2020-06-02 ENCOUNTER — Telehealth: Payer: Self-pay | Admitting: Family Medicine

## 2020-06-02 NOTE — Telephone Encounter (Signed)
Patient states symptoms from last office visit on 05/23/2020 is not improving and states hydrOXYzine (ATARAX/VISTARIL) 10 MG tablet is not working. Patient requesting alternate to address nasal drainage and drainage is running down her throat causing her to coug up dark green sticky mucus. Please [facility] [address] [place], [facility] Phone:  [phone]  Fax:  [phone]

## 2020-06-03 ENCOUNTER — Encounter: Payer: Self-pay | Admitting: Family Medicine

## 2020-06-03 ENCOUNTER — Ambulatory Visit (INDEPENDENT_AMBULATORY_CARE_PROVIDER_SITE_OTHER): Payer: Medicare HMO | Admitting: Family Medicine

## 2020-06-03 DIAGNOSIS — J0100 Acute maxillary sinusitis, unspecified: Secondary | ICD-10-CM

## 2020-06-03 MED ORDER — AMOXICILLIN-POT CLAVULANATE 875-125 MG PO TABS: 1.0000 | ORAL_TABLET | Freq: Two times a day (BID) | ORAL | 0 refills | Status: DC

## 2020-06-03 NOTE — Progress Notes (Signed)
Name: Sarah Gordon   MRN: [id number]    DOB: [birth year]   Date:06/03/2020       Progress Note  Subjective  Chief Complaint  Chief Complaint  Patient presents with  . Sinusitis  . Nasal Congestion    I connected with  [name] on 06/03/20 at 12:00 PM EDT by telephone and verified that I am speaking with the correct person using two identifiers.  I discussed the limitations, risks, security and privacy concerns of performing an evaluation and management service by telephone and the availability of in person appointments. Staff also discussed with the patient that there may be a patient responsible charge related to this service. Patient agreed on having a virtual visit  Patient Location: at home  Provider Location: at home  Additional Individuals present: [facility]  HPI  Sinusitis: she states symptoms started 2 weeks ago, when she came in she had some paresthesia on left maxillary area, but since than she noticed post-nasal drainage, facial pressure, blowing her nose and it is green and this morning foul smell. Denies fever, chills, nausea, vomiting or change in appetite. She has asthma but no wheezing, cough or SOB. She has DM but has not checked her glucose, she states she denies polyphagia, polydipsia or polyuria. She has been Fluticasone nasal spray, she has also tried mucinex, discussed nasal saline   She just had COVID-10 May 2020  Patient Active Problem List   Diagnosis Date Noted  . Ventricular flutter ([place]) 12/06/2018  . MDD (major depressive disorder), recurrent episode, mild ([place]) 09/04/2018  . DDD (degenerative disc disease), cervical 01/23/2016  . Degenerative joint disease of left acromioclavicular joint 01/23/2016  . Recurrent oral herpes simplex infection 09/01/2015  . Irritable bowel syndrome with diarrhea 08/07/2015  . Statin intolerance 02/28/2015  . Asthma, intermittent 09/10/2014  . Controlled type 2 diabetes with neuropathy ([place]) 09/10/2014  . Fibromyalgia  09/10/2014  . Obstructive sleep apnea 09/10/2014  . Hyperlipidemia 12/29/2012  . Essential hypertension 12/29/2012  . Morbid obesity ([place]) 12/29/2012  . Arthritis due to gout 12/13/2007  . Acid reflux 10/24/2006  . Adult hypothyroidism 10/24/2006  . Localized osteoarthrosis 10/24/2006    Past Surgical History:  Procedure Laterality Date  . BREAST BIOPSY Left    benign  . COLONOSCOPY     Dr Alveta Heimlich  . COLONOSCOPY WITH PROPOFOL N/A 11/19/2015   Procedure: COLONOSCOPY WITH PROPOFOL;  Surgeon: Christene Lye, MD;  Location: [facility] ENDOSCOPY;  Service: Endoscopy;  Laterality: N/A;  . ESOPHAGOGASTRODUODENOSCOPY N/A 08/20/2014   Procedure: ESOPHAGOGASTRODUODENOSCOPY (EGD);  Surgeon: Christene Lye, MD;  Location: [facility] ENDOSCOPY;  Service: Endoscopy;  Laterality: N/A;  . FOOT SURGERY Left    heel spur  . HERNIA REPAIR     umbilical  . NASAL SINUS SURGERY  1980s  . UMBILICAL HERNIA REPAIR      Family History  Problem Relation Age of Onset  . Hypertension Mother   . Hyperlipidemia Mother   . Diabetes Mother   . Heart disease Mother   . Stroke Mother   . Heart disease Brother 21       CABG x 3   . Heart attack Brother 49  . Lupus Sister   . Arthritis Sister   . Asthma Sister   . Depression Sister   . Diabetes Brother   . Hypertension Brother   . Obesity Brother   . Early death Brother   . Miscarriages / Stillbirths Sister   . Stroke Maternal Grandfather   .  Breast cancer Neg Hx     Social History   Socioeconomic History  . Marital status: Married    Spouse name: Georgena Spurling   . Number of children: 1  . Years of education: Not on file  . Highest education level: Some college, no degree  Occupational History    Comment: works part time at [employer]  . Smoking status: Never Smoker  . Smokeless tobacco: Never Used  Vaping Use  . Vaping Use: Never used  Substance and Sexual Activity  . Alcohol use: No    Alcohol/week: 0.0 standard drinks    Comment:  RARELY  . Drug use: No  . Sexual activity: Never  Other Topics Concern  . Not on file  Social History Narrative  . Not on file   Social Determinants of Health   Financial Resource Strain: Low Risk   . Difficulty of Paying Living Expenses: Not very hard  Food Insecurity: No Food Insecurity  . Worried About Charity fundraiser in the Last Year: Never true  . Ran Out of Food in the Last Year: Never true  Transportation Needs: No Transportation Needs  . Lack of Transportation (Medical): No  . Lack of Transportation (Non-Medical): No  Physical Activity: Inactive  . Days of Exercise per Week: 0 days  . Minutes of Exercise per Session: 0 min  Stress: No Stress Concern Present  . Feeling of Stress : Only a little  Social Connections: Socially Integrated  . Frequency of Communication with Friends and Family: More than three times a week  . Frequency of Social Gatherings with Friends and Family: More than three times a week  . Attends Religious Services: More than 4 times per year  . Active Member of Clubs or Organizations: Yes  . Attends Archivist Meetings: More than 4 times per year  . Marital Status: Married  Human resources officer Violence: Not At Risk  . Fear of Current or Ex-Partner: No  . Emotionally Abused: No  . Physically Abused: No  . Sexually Abused: No     Current Outpatient Medications:  .  albuterol (VENTOLIN HFA) 108 (90 Base) MCG/ACT inhaler, Inhale 2 puffs into the lungs every 6 (six) hours as needed for wheezing or shortness of breath., Disp: 1 each, Rfl: 2 .  DULoxetine (CYMBALTA) 30 MG capsule, Take 1 capsule (30 mg total) by mouth daily., Disp: 90 capsule, Rfl: 0 .  ezetimibe (ZETIA) 10 MG tablet, Take 1 tablet (10 mg total) by mouth daily., Disp: 90 tablet, Rfl: 0 .  hydrOXYzine (ATARAX/VISTARIL) 10 MG tablet, Take 1 tablet (10 mg total) by mouth 3 (three) times daily as needed., Disp: 30 tablet, Rfl: 0 .  levothyroxine (SYNTHROID) 100 MCG tablet, Take 1  tablet (100 mcg total) by mouth daily., Disp: 90 tablet, Rfl: 0 .  metoprolol succinate (TOPROL-XL) 25 MG 24 hr tablet, Take 1 tablet (25 mg total) by mouth daily., Disp: 90 tablet, Rfl: 0 .  montelukast (SINGULAIR) 10 MG tablet, Take 1 tablet (10 mg total) by mouth at bedtime., Disp: 90 tablet, Rfl: 0 .  Multiple Vitamins-Minerals (HAIR SKIN NAILS PO), Take 1 tablet by mouth daily., Disp: , Rfl:  .  omeprazole (PRILOSEC) 20 MG capsule, Take 1 capsule (20 mg total) by mouth daily., Disp: 90 capsule, Rfl: 0 .  valsartan (DIOVAN) 160 MG tablet, Take 1 tablet (160 mg total) by mouth daily. In place of losartan due to drug shortage, Disp: 90 tablet, Rfl: 0  Allergies  Allergen Reactions  . Aspir-81 [Aspirin] Other (See Comments)    Coagulation disorder  . Lipitor [Atorvastatin] Other (See Comments)    Joint pain  . Statins Other (See Comments)  . Topamax [Topiramate] Other (See Comments)    Cognitive issues   . Vascepa [Icosapent Ethyl] Swelling and Other (See Comments)  . Voltaren [Diclofenac Sodium] Hives and Other (See Comments)    I personally reviewed active problem list, medication list, allergies, family history, social history with the patient/caregiver today.   ROS  Ten systems reviewed and is negative except as mentioned in HPI   Objective  Virtual encounter, vitals not obtained.  There is no height or weight on file to calculate BMI.  Physical Exam  Awake, alert and oriented. Sounded nasally   PHQ2/9: Depression screen [facility] 2/9 06/03/2020 05/27/2020 05/23/2020 01/22/2020 11/02/2019  Decreased Interest 0 0 0 0 0  Down, Depressed, Hopeless 0 0 0 1 1  PHQ - 2 Score 0 0 0 1 1  Altered sleeping 1 0 0 2 -  Tired, decreased energy 1 1 1 2  -  Change in appetite 0 1 1 1  -  Feeling bad or failure about yourself  0 0 0 0 -  Trouble concentrating 0 0 0 0 -  Moving slowly or fidgety/restless 0 0 0 0 -  Suicidal thoughts 0 0 0 0 -  PHQ-9 Score 2 2 2 6  -  Difficult doing work/chores  - - - Not difficult at all -  Some recent data might be hidden   PHQ-2/9 Result is negative.    Fall Risk: Fall Risk  06/03/2020 05/27/2020 05/23/2020 01/22/2020 11/02/2019  Falls in the past year? 0 1 1 0 0  Number falls in past yr: 0 0 0 0 -  Injury with Fall? 0 0 0 0 -  Risk for fall due to : - No Fall Risks - - -  Follow up - Falls prevention discussed - - Falls prevention discussed     Assessment & Plan  1. Subacute maxillary sinusitis  - amoxicillin-clavulanate (AUGMENTIN) 875-125 MG tablet; Take 1 tablet by mouth 2 (two) times daily.  Dispense: 20 tablet; Refill: 0  I discussed the assessment and treatment plan with the patient. The patient was provided an opportunity to ask questions and all were answered. The patient agreed with the plan and demonstrated an understanding of the instructions.   The patient was advised to call back or seek an in-person evaluation if the symptoms worsen or if the condition fails to improve as anticipated.  I provided 15 minutes of non-face-to-face time during this encounter.  Steele Sizer, MD

## 2020-06-03 NOTE — Telephone Encounter (Signed)
Did you mean a virtual visit?

## 2020-06-04 ENCOUNTER — Ambulatory Visit: Payer: Self-pay

## 2020-06-04 DIAGNOSIS — I10 Essential (primary) hypertension: Secondary | ICD-10-CM

## 2020-06-04 NOTE — Chronic Care Management (AMB) (Signed)
  Chronic Care Management   Note  06/04/2020 Name: BRAYLEE LAL MRN: [id number] DOB: Nov 30, 1950    Ms. Wisdom is currently enrolled in the chronic care management program. She confirmed availability for outreach on 06/26/20. No urgent concerns or care management needs. Agreed to call if needed prior to the scheduled outreach.    Follow up plan: Will follow up as scheduled on 06/26/20.    Cristy [facility] Care Management [facility] [phone]

## 2020-06-26 ENCOUNTER — Ambulatory Visit: Payer: Self-pay

## 2020-06-26 DIAGNOSIS — I10 Essential (primary) hypertension: Secondary | ICD-10-CM

## 2020-06-26 DIAGNOSIS — E1169 Type 2 diabetes mellitus with other specified complication: Secondary | ICD-10-CM

## 2020-06-26 DIAGNOSIS — J4520 Mild intermittent asthma, uncomplicated: Secondary | ICD-10-CM

## 2020-06-26 DIAGNOSIS — E785 Hyperlipidemia, unspecified: Secondary | ICD-10-CM

## 2020-06-26 DIAGNOSIS — E1140 Type 2 diabetes mellitus with diabetic neuropathy, unspecified: Secondary | ICD-10-CM

## 2020-06-26 NOTE — Patient Instructions (Signed)
  Thank you for allowing the Chronic Care Management team to participate in your care. It was a pleasure speaking with you today.     Goals Addressed            This Visit's Progress   . COMPLETED: Chronic Disease Management       CARE PLAN ENTRY (see longitudinal plan of care for additional care plan information)  Current Barriers:  . Chronic Disease Management support and education needs related to Hypertension, Diabetes, Asthma and Hyperlipidemia  Case Manager Clinical Goal(s):  Over the next 120 days, patient will: . Attend medical appointments as scheduled. . Take medications as prescribed.-Complete . Monitor blood pressure and record readings. . Adhere to recommended cardiac prudent/diabetic diet.  . Follow recommended safety measures to prevent falls and injuries. . Over the next 30 days, patient will: . Update care management team if additional caregiver resources are needed.-Complete . Follow-up with PCP regarding effectiveness of duloxetine dose.-Complete  Interventions:  . Inter-disciplinary care team collaboration (see longitudinal plan of care) . Reviewed medications and treatment recommendations. Reports excellent compliance with medications. She was previously considering switching to Repatha for optimal hyperlipidemia management, however the cost will not be covered by her insurance provider. Reports taking Zetia as prescribed without complications.   . Discussed treatment recommendations and care management needs. She remains compliant with treatment recommendation. No changes in ambulation or activity tolerance. She reports significant improvements with her diet and remains motivated to improve her overall health. Denies urgent concerns or changes in care management needs. She has met her care management goals. She will complete a routine clinic visit in July. Agreed to call or notify her provider if her health needs change and additional assistance is needed.     Patient Self Care Activities:  . Self administers medications  . Attends scheduled provider appointments . Calls pharmacy for medication refills . Performs ADL's independently . Performs IADL's independently . Calls provider office for new concerns or questions   Please see past updates related to this goal by clicking on the "Past Updates" button in the selected goal                Sarah Gordon verbalized understanding of the information discussed during the telephonic outreach today. She has met her care management goals. She will complete a routine clinic visit in July. She agreed to call or notify her provider if her care needs change and additional outreach is needed. The care management team will gladly assist.     Sarah Gordon [facility] Care Management [facility] [phone]

## 2020-06-26 NOTE — Chronic Care Management (AMB) (Signed)
Chronic Care Management   Follow Up Note   06/26/2020 Name: Sarah Gordon MRN: [id number] DOB: Oct 18, 1950  Primary Care Provider: Steele Sizer, MD Reason for referral : Chronic Care Management    Sarah Gordon is a 70 y.o. year old female who is a primary care patient of Steele Sizer, MD. The CCM team was consulted for assistance with chronic disease management and care coordination. She has met her care management goals.  Review of Sarah Gordon's status, including review of consultants reports, relevant labs and test results was conducted today. Collaboration with appropriate care team members was performed as part of the comprehensive evaluation and provision of chronic care management services.    SDOH (Social Determinants of Health) assessments performed: No    Outpatient Encounter Medications as of 06/26/2020  Medication Sig Note  . albuterol (VENTOLIN HFA) 108 (90 Base) MCG/ACT inhaler Inhale 2 puffs into the lungs every 6 (six) hours as needed for wheezing or shortness of breath.   Marland Kitchen amoxicillin-clavulanate (AUGMENTIN) 875-125 MG tablet Take 1 tablet by mouth 2 (two) times daily. (Patient not taking: Reported on 06/26/2020) 06/26/2020: Completed course  . DULoxetine (CYMBALTA) 30 MG capsule Take 1 capsule (30 mg total) by mouth daily.   Marland Kitchen ezetimibe (ZETIA) 10 MG tablet Take 1 tablet (10 mg total) by mouth daily.   . hydrOXYzine (ATARAX/VISTARIL) 10 MG tablet Take 1 tablet (10 mg total) by mouth 3 (three) times daily as needed. 05/27/2020: Has not started yet   . levothyroxine (SYNTHROID) 100 MCG tablet Take 1 tablet (100 mcg total) by mouth daily.   . metoprolol succinate (TOPROL-XL) 25 MG 24 hr tablet Take 1 tablet (25 mg total) by mouth daily.   . montelukast (SINGULAIR) 10 MG tablet Take 1 tablet (10 mg total) by mouth at bedtime.   . Multiple Vitamins-Minerals (HAIR SKIN NAILS PO) Take 1 tablet by mouth daily.   Marland Kitchen omeprazole (PRILOSEC) 20 MG capsule Take 1 capsule (20 mg total) by  mouth daily.   . valsartan (DIOVAN) 160 MG tablet Take 1 tablet (160 mg total) by mouth daily. In place of losartan due to drug shortage   . [DISCONTINUED] Fluticasone-Salmeterol (ADVAIR DISKUS) 250-50 MCG/DOSE AEPB Inhale 1 puff into the lungs 2 (two) times daily. (Patient not taking: Reported on 05/24/2019)    No facility-administered encounter medications on file as of 06/26/2020.     Objective:  Goals Addressed            This Visit's Progress   . COMPLETED: Chronic Disease Management       CARE PLAN ENTRY (see longitudinal plan of care for additional care plan information)  Current Barriers:  . Chronic Disease Management support and education needs related to Hypertension, Diabetes, Asthma and Hyperlipidemia  Case Manager Clinical Goal(s):  Over the next 120 days, patient will: . Attend medical appointments as scheduled. . Take medications as prescribed.-Complete . Monitor blood pressure and record readings. . Adhere to recommended cardiac prudent/diabetic diet.  . Follow recommended safety measures to prevent falls and injuries. . Over the next 30 days, patient will: . Update care management team if additional caregiver resources are needed.-Complete . Follow-up with PCP regarding effectiveness of duloxetine dose.-Complete  Interventions:  . Inter-disciplinary care team collaboration (see longitudinal plan of care) . Reviewed medications and treatment recommendations. Reports excellent compliance with medications. She was previously considering switching to Repatha for optimal hyperlipidemia management, however the cost will not be covered by her insurance provider. Reports taking Zetia as  prescribed without complications.   . Discussed treatment recommendations and care management needs. She remains compliant with treatment recommendation. No changes in ambulation or activity tolerance. She reports significant improvements with her diet and remains motivated to improve her  overall health. Denies urgent concerns or changes in care management needs. She has met her care management goals. She will complete a routine clinic visit in July. Agreed to call or notify her provider if her health needs change and additional assistance is needed.    Patient Self Care Activities:  . Self administers medications  . Attends scheduled provider appointments . Calls pharmacy for medication refills . Performs ADL's independently . Performs IADL's independently . Calls provider office for new concerns or questions   Please see past updates related to this goal by clicking on the "Past Updates" button in the selected goal             PLAN Sarah Gordon has met her care management goals. She will complete a routine clinic visit in July. Agreed to call or notify her provider if her care needs change and additional outreach is needed. The care management team will gladly assist.     Horris Latino [facility] Care Management [facility] [phone]

## 2020-07-09 DIAGNOSIS — H2513 Age-related nuclear cataract, bilateral: Secondary | ICD-10-CM | POA: Diagnosis not present

## 2020-07-09 DIAGNOSIS — H5213 Myopia, bilateral: Secondary | ICD-10-CM | POA: Diagnosis not present

## 2020-07-09 DIAGNOSIS — E119 Type 2 diabetes mellitus without complications: Secondary | ICD-10-CM | POA: Diagnosis not present

## 2020-07-09 LAB — HM DIABETES EYE EXAM

## 2020-07-18 DIAGNOSIS — S63501A Unspecified sprain of right wrist, initial encounter: Secondary | ICD-10-CM | POA: Diagnosis not present

## 2020-07-18 DIAGNOSIS — M7541 Impingement syndrome of right shoulder: Secondary | ICD-10-CM | POA: Diagnosis not present

## 2020-08-08 ENCOUNTER — Other Ambulatory Visit: Payer: Self-pay | Admitting: Family Medicine

## 2020-08-08 DIAGNOSIS — E785 Hyperlipidemia, unspecified: Secondary | ICD-10-CM

## 2020-08-08 DIAGNOSIS — E1169 Type 2 diabetes mellitus with other specified complication: Secondary | ICD-10-CM

## 2020-08-08 NOTE — Telephone Encounter (Signed)
Requested Prescriptions  Pending Prescriptions Disp Refills  . ezetimibe (ZETIA) 10 MG tablet [Pharmacy Med Name: EZETIMIBE 10MG  TABLETS] 90 tablet 0    Sig: TAKE 1 TABLET BY MOUTH EVERY DAY     Cardiovascular:  Antilipid - Sterol Transport Inhibitors Failed - 08/08/2020  3:14 AM      Failed - Total Cholesterol in normal range and within 360 days    Cholesterol, Total  Date Value Ref Range Status  09/11/2014 161 100 - 199 mg/dL Final   Cholesterol  Date Value Ref Range Status  05/23/2020 232 (H) <200 mg/dL Final         Failed - LDL in normal range and within 360 days    LDL Cholesterol (Calc)  Date Value Ref Range Status  05/23/2020 160 (H) mg/dL (calc) Final    Comment:    Reference range: <100 . Desirable range <100 mg/dL for primary prevention;   <70 mg/dL for patients with CHD or diabetic patients  with > or = 2 CHD risk factors. Marland Kitchen LDL-C is now calculated using the Martin-Hopkins  calculation, which is a validated novel method providing  better accuracy than the Friedewald equation in the  estimation of LDL-C.  Cresenciano Genre et al. Annamaria Helling. 8546;270(35): 2061-2068  (http://education.[employer].com/faq/FAQ164)          Failed - HDL in normal range and within 360 days    HDL  Date Value Ref Range Status  05/23/2020 46 (L) > OR = 50 mg/dL Final  09/11/2014 45 >39 mg/dL Final    Comment:    According to ATP-III Guidelines, HDL-C >59 mg/dL is considered a negative risk factor for CHD.          Passed - Triglycerides in normal range and within 360 days    Triglycerides  Date Value Ref Range Status  05/23/2020 134 <150 mg/dL Final         Passed - Valid encounter within last 12 months    Recent Outpatient Visits          2 months ago Subacute maxillary sinusitis   [facility] Steele Sizer, MD   2 months ago Controlled type 2 diabetes with neuropathy [facility])   [facility] Steele Sizer, MD   6 months ago Controlled  type 2 diabetes with neuropathy [facility])   [facility] Steele Sizer, MD   9 months ago URI, acute   [facility] [place], Drue Stager, MD   11 months ago Controlled type 2 diabetes with neuropathy [facility])   [facility] Steele Sizer, MD      Future Appointments            In 2 months Ancil Boozer, Drue Stager, MD [facility] [place]   In 3 months Steele Sizer, MD [facility] [place]   In 9 months  [facility], [facility]

## 2020-08-10 ENCOUNTER — Other Ambulatory Visit: Payer: Self-pay | Admitting: Family Medicine

## 2020-08-10 DIAGNOSIS — I4902 Ventricular flutter: Secondary | ICD-10-CM

## 2020-08-10 DIAGNOSIS — I10 Essential (primary) hypertension: Secondary | ICD-10-CM

## 2020-08-10 DIAGNOSIS — E038 Other specified hypothyroidism: Secondary | ICD-10-CM

## 2020-08-10 NOTE — Telephone Encounter (Signed)
Requested Prescriptions  Pending Prescriptions Disp Refills  . levothyroxine (SYNTHROID) 100 MCG tablet [Pharmacy Med Name: LEVOTHYROXINE 0.100MG  (100MCG) TAB] 90 tablet 2    Sig: TAKE 1 TABLET(100 MCG) BY MOUTH DAILY     Endocrinology:  Hypothyroid Agents Failed - 08/10/2020 10:04 AM      Failed - TSH needs to be rechecked within 3 months after an abnormal result. Refill until TSH is due.      Passed - TSH in normal range and within 360 days    TSH  Date Value Ref Range Status  05/23/2020 2.49 0.40 - 4.50 mIU/L Final         Passed - Valid encounter within last 12 months    Recent Outpatient Visits          2 months ago Subacute maxillary sinusitis   [facility], Drue Stager, MD   2 months ago Controlled type 2 diabetes with neuropathy [facility])   [facility], Drue Stager, MD   6 months ago Controlled type 2 diabetes with neuropathy [facility])   [facility] Steele Sizer, MD   9 months ago URI, acute   [facility] [place], Drue Stager, MD   11 months ago Controlled type 2 diabetes with neuropathy [facility])   [facility] Steele Sizer, MD      Future Appointments            In 2 months Steele Sizer, MD [facility], [place]   In 3 months [facility] [place]   In 9 months  [facility]           . metoprolol succinate (TOPROL-XL) 25 MG 24 hr tablet [Pharmacy Med Name: METOPROLOL ER SUCCINATE 25MG  TABS] 90 tablet 0    Sig: TAKE 1 TABLET(25 MG) BY MOUTH DAILY     Cardiovascular:  Beta Blockers Failed - 08/10/2020 10:04 AM      Failed - Last BP in normal range    BP Readings from Last 1 Encounters:  05/23/20 140/84         Passed - Last Heart Rate in normal range    Pulse Readings from Last 1 Encounters:  05/23/20 81         Passed - Valid encounter within last 6 months    Recent Outpatient Visits           2 months ago Subacute maxillary sinusitis   [facility], Drue Stager, MD   2 months ago Controlled type 2 diabetes with neuropathy [facility])   [facility], Drue Stager, MD   6 months ago Controlled type 2 diabetes with neuropathy [facility])   [facility] Steele Sizer, MD   9 months ago URI, acute   [facility] [place], Drue Stager, MD   11 months ago Controlled type 2 diabetes with neuropathy [facility])   [facility] Steele Sizer, MD      Future Appointments            In 2 months Steele Sizer, MD [facility], [place]   In 3 months Steele Sizer, MD [place]   In 9 months  [facility]           . valsartan (DIOVAN) 160 MG tablet [Pharmacy Med Name: VALSARTAN 160MG  TABLETS] 90 tablet 0    Sig: TAKE 1 TABLET  BY MOUTH EVERY DAY; IN PLACE OF LOSARTAN     Cardiovascular:  Angiotensin Receptor Blockers Failed - 08/10/2020 10:04 AM      Failed - Last BP in normal range    BP Readings from Last 1 Encounters:  05/23/20 140/84         Passed - Cr in normal range and within 180 days    Creat  Date Value Ref Range Status  05/23/2020 0.86 0.50 - 0.99 mg/dL Final    Comment:    For patients >78 years of age, the reference limit for Creatinine is approximately 13% higher for people identified as African-American. .    Creatinine, Urine  Date Value Ref Range Status  08/29/2019 116 20 - 275 mg/dL Final         Passed - K in normal range and within 180 days    Potassium  Date Value Ref Range Status  05/23/2020 4.4 3.5 - 5.3 mmol/L Final         Passed - Patient is not pregnant      Passed - Valid encounter within last 6 months    Recent Outpatient Visits          2 months ago Subacute maxillary sinusitis   [facility], Drue Stager, MD   2 months ago Controlled type 2 diabetes  with neuropathy [facility])   [facility] Steele Sizer, MD   6 months ago Controlled type 2 diabetes with neuropathy [facility])   [facility] Steele Sizer, MD   9 months ago URI, acute   [facility] [place], Drue Stager, MD   11 months ago Controlled type 2 diabetes with neuropathy [facility])   [facility] Steele Sizer, MD      Future Appointments            In 2 months [facility], Drue Stager, MD [facility], [place]   In 3 months Steele Sizer, MD [facility], [place]   In 9 months  [facility], [facility]

## 2020-08-14 ENCOUNTER — Other Ambulatory Visit: Payer: Self-pay | Admitting: Family Medicine

## 2020-08-14 DIAGNOSIS — M76829 Posterior tibial tendinitis, unspecified leg: Secondary | ICD-10-CM | POA: Diagnosis not present

## 2020-08-14 DIAGNOSIS — K219 Gastro-esophageal reflux disease without esophagitis: Secondary | ICD-10-CM

## 2020-08-14 DIAGNOSIS — I10 Essential (primary) hypertension: Secondary | ICD-10-CM

## 2020-08-14 DIAGNOSIS — E785 Hyperlipidemia, unspecified: Secondary | ICD-10-CM

## 2020-08-14 DIAGNOSIS — M2141 Flat foot [pes planus] (acquired), right foot: Secondary | ICD-10-CM | POA: Diagnosis not present

## 2020-08-14 DIAGNOSIS — E1169 Type 2 diabetes mellitus with other specified complication: Secondary | ICD-10-CM

## 2020-08-14 MED ORDER — VALSARTAN 160 MG PO TABS: 160.0000 mg | ORAL_TABLET | Freq: Every day | ORAL | 0 refills | Status: DC

## 2020-08-14 MED ORDER — OMEPRAZOLE 20 MG PO CPDR: 20.0000 mg | DELAYED_RELEASE_CAPSULE | Freq: Every day | ORAL | 0 refills | Status: DC

## 2020-08-14 MED ORDER — EZETIMIBE 10 MG PO TABS: 10.0000 mg | ORAL_TABLET | Freq: Every day | ORAL | 0 refills | Status: DC

## 2020-08-15 DIAGNOSIS — M25571 Pain in right ankle and joints of right foot: Secondary | ICD-10-CM | POA: Diagnosis not present

## 2020-08-24 ENCOUNTER — Telehealth: Payer: Medicare HMO | Admitting: Family

## 2020-08-24 ENCOUNTER — Encounter: Payer: Self-pay | Admitting: Family

## 2020-08-24 DIAGNOSIS — J019 Acute sinusitis, unspecified: Secondary | ICD-10-CM

## 2020-08-24 MED ORDER — AMOXICILLIN-POT CLAVULANATE 875-125 MG PO TABS: 1.0000 | ORAL_TABLET | Freq: Two times a day (BID) | ORAL | 0 refills | Status: DC

## 2020-08-24 NOTE — Progress Notes (Signed)
   Virtual Visit  Note Due to COVID-19 pandemic this visit was conducted virtually. This visit type was conducted due to national recommendations for restrictions regarding the COVID-19 Pandemic (e.g. social distancing, sheltering in place) in an effort to limit this patient's exposure and mitigate transmission in our community. All issues noted in this document were discussed and addressed.  A physical exam was not performed with this format.  I connected with Sarah Gordon on 08/24/20 at 6:45 pm  by telephone and verified that I am speaking with the correct person using two identifiers. Sarah Gordon is currently located at home and no one is currently with her during visit. The provider, Evelina Dun, FNP is located in their office at time of visit.  I discussed the limitations, risks, security and privacy concerns of performing an evaluation and management service by telephone and the availability of in person appointments. I also discussed with the patient that there may be a patient responsible charge related to this service. The patient expressed understanding and agreed to proceed.   History and Present Illness:  Sinusitis This is a new problem. The current episode started in the past 7 days. The problem has been gradually worsening since onset. There has been no fever. The pain is mild. Associated symptoms include congestion, coughing, ear pain, headaches, a hoarse voice, sinus pressure, sneezing and a sore throat. Past treatments include acetaminophen and oral decongestants. The treatment provided mild relief.      Review of Systems  HENT: Positive for congestion, ear pain, hoarse voice, sinus pressure, sneezing and sore throat.   Respiratory: Positive for cough.   Neurological: Positive for headaches.     Observations/Objective: No SOB or distress noted, nasal congestion, pain in sinus and intermittent cough.   Assessment and Plan: 1. Acute sinusitis, recurrence not specified,  unspecified location - Take meds as prescribed - Use a cool mist humidifier  -Use saline nose sprays frequently -Force fluids -For any cough or congestion  Use plain Mucinex- regular strength or max strength is fine -For fever or aces or pains- take tylenol or ibuprofen. -Throat lozenges if help -Follow up if symptoms worsen or do not improve  - amoxicillin-clavulanate (AUGMENTIN) 875-125 MG tablet; Take 1 tablet by mouth 2 (two) times daily.  Dispense: 14 tablet; Refill: 0       I discussed the assessment and treatment plan with the patient. The patient was provided an opportunity to ask questions and all were answered. The patient agreed with the plan and demonstrated an understanding of the instructions.   The patient was advised to call back or seek an in-person evaluation if the symptoms worsen or if the condition fails to improve as anticipated.  The above assessment and management plan was discussed with the patient. The patient verbalized understanding of and has agreed to the management plan. Patient is aware to call the clinic if symptoms persist or worsen. Patient is aware when to return to the clinic for a follow-up visit. Patient educated on when it is appropriate to go to the emergency department.   Time call ended:  6:52 pm   I provided 7 minutes of   face-to-face time during this encounter.    Evelina Dun, FNP

## 2020-08-26 DIAGNOSIS — H6092 Unspecified otitis externa, left ear: Secondary | ICD-10-CM | POA: Diagnosis not present

## 2020-08-29 ENCOUNTER — Other Ambulatory Visit: Payer: Self-pay | Admitting: Family Medicine

## 2020-08-29 DIAGNOSIS — M25571 Pain in right ankle and joints of right foot: Secondary | ICD-10-CM | POA: Diagnosis not present

## 2020-08-29 DIAGNOSIS — F3340 Major depressive disorder, recurrent, in remission, unspecified: Secondary | ICD-10-CM

## 2020-09-01 ENCOUNTER — Other Ambulatory Visit: Payer: Self-pay

## 2020-09-01 MED ORDER — DULOXETINE HCL 30 MG PO CPEP: 30.0000 mg | ORAL_CAPSULE | Freq: Every day | ORAL | 0 refills | Status: DC

## 2020-09-02 DIAGNOSIS — M25571 Pain in right ankle and joints of right foot: Secondary | ICD-10-CM | POA: Diagnosis not present

## 2020-09-10 DIAGNOSIS — M25571 Pain in right ankle and joints of right foot: Secondary | ICD-10-CM | POA: Diagnosis not present

## 2020-09-16 DIAGNOSIS — M25571 Pain in right ankle and joints of right foot: Secondary | ICD-10-CM | POA: Diagnosis not present

## 2020-09-18 DIAGNOSIS — M25571 Pain in right ankle and joints of right foot: Secondary | ICD-10-CM | POA: Diagnosis not present

## 2020-09-25 DIAGNOSIS — M7751 Other enthesopathy of right foot: Secondary | ICD-10-CM | POA: Diagnosis not present

## 2020-09-25 DIAGNOSIS — M25571 Pain in right ankle and joints of right foot: Secondary | ICD-10-CM | POA: Diagnosis not present

## 2020-10-01 DIAGNOSIS — M25571 Pain in right ankle and joints of right foot: Secondary | ICD-10-CM | POA: Diagnosis not present

## 2020-10-09 NOTE — Patient Instructions (Signed)
Preventive Care 68 Years and Older, Female Preventive care refers to lifestyle choices and visits with your health care provider that can promote health and wellness. This includes: A yearly physical exam. This is also called an annual wellness visit. Regular dental and eye exams. Immunizations. Screening for certain conditions. Healthy lifestyle choices, such as: Eating a healthy diet. Getting regular exercise. Not using drugs or products that contain nicotine and tobacco. Limiting alcohol use. What can I expect for my preventive care visit? Physical exam Your health care provider will check your: Height and weight. These may be used to calculate your BMI (body mass index). BMI is a measurement that tells if you are at a healthy weight. Heart rate and blood pressure. Body temperature. Skin for abnormal spots. Counseling Your health care provider may ask you questions about your: Past medical problems. Family's medical history. Alcohol, tobacco, and drug use. Emotional well-being. Home life and relationship well-being. Sexual activity. Diet, exercise, and sleep habits. History of falls. Memory and ability to understand (cognition). Work and work Statistician. Pregnancy and menstrual history. Access to firearms. What immunizations do I need?  Vaccines are usually given at various ages, according to a schedule. Your health care provider will recommend vaccines for you based on your age, medicalhistory, and lifestyle or other factors, such as travel or where you work. What tests do I need? Blood tests Lipid and cholesterol levels. These may be checked every 5 years, or more often depending on your overall health. Hepatitis C test. Hepatitis B test. Screening Lung cancer screening. You may have this screening every year starting at age 44 if you have a 30-pack-year history of smoking and currently smoke or have quit within the past 15 years. Colorectal cancer screening. All  adults should have this screening starting at age 39 and continuing until age 65. Your health care provider may recommend screening at age 61 if you are at increased risk. You will have tests every 1-10 years, depending on your results and the type of screening test. Diabetes screening. This is done by checking your blood sugar (glucose) after you have not eaten for a while (fasting). You may have this done every 1-3 years. Mammogram. This may be done every 1-2 years. Talk with your health care provider about how often you should have regular mammograms. Abdominal aortic aneurysm (AAA) screening. You may need this if you are a current or former smoker. BRCA-related cancer screening. This may be done if you have a family history of breast, ovarian, tubal, or peritoneal cancers. Other tests STD (sexually transmitted disease) testing, if you are at risk. Bone density scan. This is done to screen for osteoporosis. You may have this done starting at age 54. Talk with your health care provider about your test results, treatment options,and if necessary, the need for more tests. Follow these instructions at home: Eating and drinking  Eat a diet that includes fresh fruits and vegetables, whole grains, lean protein, and low-fat dairy products. Limit your intake of foods with high amounts of sugar, saturated fats, and salt. Take vitamin and mineral supplements as recommended by your health care provider. Do not drink alcohol if your health care provider tells you not to drink. If you drink alcohol: Limit how much you have to 0-1 drink a day. Be aware of how much alcohol is in your drink. In the [place] one drink equals one 12 oz bottle of beer (355 mL), one 5 oz glass of wine (148 mL), or one 1  oz glass of hard liquor (44 mL).  Lifestyle Take daily care of your teeth and gums. Brush your teeth every morning and night with fluoride toothpaste. Floss one time each day. Stay active. Exercise for at  least 30 minutes 5 or more days each week. Do not use any products that contain nicotine or tobacco, such as cigarettes, e-cigarettes, and chewing tobacco. If you need help quitting, ask your health care provider. Do not use drugs. If you are sexually active, practice safe sex. Use a condom or other form of protection in order to prevent STIs (sexually transmitted infections). Talk with your health care provider about taking a low-dose aspirin or statin. Find healthy ways to cope with stress, such as: Meditation, yoga, or listening to music. Journaling. Talking to a trusted person. Spending time with friends and family. Safety Always wear your seat belt while driving or riding in a vehicle. Do not drive: If you have been drinking alcohol. Do not ride with someone who has been drinking. When you are tired or distracted. While texting. Wear a helmet and other protective equipment during sports activities. If you have firearms in your house, make sure you follow all gun safety procedures. What's next? Visit your health care provider once a year for an annual wellness visit. Ask your health care provider how often you should have your eyes and teeth checked. Stay up to date on all vaccines. This information is not intended to replace advice given to you by your health care provider. Make sure you discuss any questions you have with your healthcare provider. Document Revised: 02/27/2020 Document Reviewed: 03/02/2018 Elsevier Patient Education  2022 [employer].

## 2020-10-09 NOTE — Progress Notes (Signed)
Name: Sarah Gordon   MRN: [id number]    DOB: 1950/08/24   Date:10/10/2020       Progress Note  Subjective  Chief Complaint  Annual Exam  HPI  Patient presents for annual CPE.  Diet: she is trying to improve her diet, she is eating a lot of fresh vegetables, eggs Exercise: discussed 150 minutes per week   Flowsheet Row Clinical Support from 05/27/2020 in [facility]  AUDIT-C Score 0      Depression: Phq 9 is  positive Depression screen [facility] 2/9 10/10/2020 06/03/2020 05/27/2020 05/23/2020 01/22/2020  Decreased Interest 0 0 0 0 0  Down, Depressed, Hopeless 1 0 0 0 1  PHQ - 2 Score 1 0 0 0 1  Altered sleeping 1 1 0 0 2  Tired, decreased energy $RemoveBeforeDE'3 1 1 1 2  'QGPPjYuVbZmNpxO$ Change in appetite 0 0 $R'1 1 1  'vM$ Feeling bad or failure about yourself  0 0 0 0 0  Trouble concentrating 0 0 0 0 0  Moving slowly or fidgety/restless 0 0 0 0 0  Suicidal thoughts 0 0 0 0 0  PHQ-9 Score $RemoveBef'5 2 2 2 6  'bFpdEHXosb$ Difficult doing work/chores - - - - Not difficult at all  Some recent data might be hidden   Hypertension: BP Readings from Last 3 Encounters:  10/10/20 130/86  05/23/20 140/84  01/22/20 (!) 142/84   Obesity: Wt Readings from Last 3 Encounters:  10/10/20 270 lb (122.5 kg)  05/23/20 272 lb 4.8 oz (123.5 kg)  01/22/20 270 lb 11.2 oz (122.8 kg)   BMI Readings from Last 3 Encounters:  10/10/20 43.58 kg/m  05/23/20 43.95 kg/m  01/22/20 43.69 kg/m     Vaccines sending rx to phamacy  Pneumonia: educated and discussed with patient. Flu: educated and discussed with patient.  Hep C Screening: 11/20/14 STD testing and prevention (HIV/chl/gon/syphilis): N/A Intimate partner violence:negative Sexual History : no sexually active  Menstrual History/LMP/Abnormal Bleeding: discussed post-menopausal bleeding  Incontinence Symptoms: mild symptoms when bladder is full   Breast cancer:  - Last Mammogram: Ordered 05/23/20 - BRCA gene screening: N/A  Osteoporosis: Discussed high calcium and vitamin D  supplementation, weight bearing exercises  Cervical cancer screening: N/A  Skin cancer: Discussed monitoring for atypical lesions  Colorectal cancer: 11/19/15   Lung cancer:  Low Dose CT Chest recommended if Age 75-80 years, 20 pack-year currently smoking OR have quit w/in 15years. Patient does not qualify.   ECG: 04/06/18  Advanced Care Planning: A voluntary discussion about advance care planning including the explanation and discussion of advance directives.  Discussed health care proxy and Living will, and the patient was able to identify a health care proxy as husband .    Lab Results  Component Value Date   CHOL 232 (H) 05/23/2020   CHOL 262 (H) 08/29/2019   CHOL 230 (H) 09/04/2018   Lab Results  Component Value Date   HDL 46 (L) 05/23/2020   HDL 40 (L) 08/29/2019   HDL 43 (L) 09/04/2018   Lab Results  Component Value Date   LDLCALC 160 (H) 05/23/2020   LDLCALC 171 (H) 08/29/2019   LDLCALC 157 (H) 09/04/2018   Lab Results  Component Value Date   TRIG 134 05/23/2020   TRIG 303 (H) 08/29/2019   TRIG 165 (H) 09/04/2018   Lab Results  Component Value Date   CHOLHDL 5.0 (H) 05/23/2020   CHOLHDL 6.6 (H) 08/29/2019   CHOLHDL 5.3 (H) 09/04/2018   No results found  for: LDLDIRECT  Glucose: Glucose, Bld  Date Value Ref Range Status  05/23/2020 97 65 - 99 mg/dL Final    Comment:    .            Fasting reference interval .   08/29/2019 93 65 - 99 mg/dL Final    Comment:    .            Fasting reference interval .   09/04/2018 122 (H) 65 - 99 mg/dL Final    Comment:    .            Fasting reference interval . For someone without known diabetes, a glucose value between 100 and 125 mg/dL is consistent with prediabetes and should be confirmed with a follow-up test. .    Glucose-Capillary  Date Value Ref Range Status  08/20/2014 99 65 - 99 mg/dL Final    Patient Active Problem List   Diagnosis Date Noted   Ventricular flutter ([place]) 12/06/2018   MDD  (major depressive disorder), recurrent episode, mild ([place]) 09/04/2018   DDD (degenerative disc disease), cervical 01/23/2016   Degenerative joint disease of left acromioclavicular joint 01/23/2016   Recurrent oral herpes simplex infection 09/01/2015   Irritable bowel syndrome with diarrhea 08/07/2015   Statin intolerance 02/28/2015   Asthma, intermittent 09/10/2014   Controlled type 2 diabetes with neuropathy ([place]) 09/10/2014   Fibromyalgia 09/10/2014   Obstructive sleep apnea 09/10/2014   Hyperlipidemia 12/29/2012   Essential hypertension 12/29/2012   Morbid obesity ([place]) 12/29/2012   Arthritis due to gout 12/13/2007   Acid reflux 10/24/2006   Adult hypothyroidism 10/24/2006   Localized osteoarthrosis 10/24/2006    Past Surgical History:  Procedure Laterality Date   BREAST BIOPSY Left    benign   COLONOSCOPY     Dr Alveta Heimlich   COLONOSCOPY WITH PROPOFOL N/A 11/19/2015   Procedure: COLONOSCOPY WITH PROPOFOL;  Surgeon: Christene Lye, MD;  Location: [facility] ENDOSCOPY;  Service: Endoscopy;  Laterality: N/A;   ESOPHAGOGASTRODUODENOSCOPY N/A 08/20/2014   Procedure: ESOPHAGOGASTRODUODENOSCOPY (EGD);  Surgeon: Christene Lye, MD;  [facility];  Service: Endoscopy;  Laterality: N/A;   FOOT SURGERY Left    heel spur   HERNIA REPAIR     umbilical   NASAL SINUS SURGERY  [id number]   UMBILICAL HERNIA REPAIR      Family History  Problem Relation Age of Onset   Hypertension Mother    Hyperlipidemia Mother    Diabetes Mother    Heart disease Mother    Stroke Mother    Heart disease Brother 9       CABG x 3    Heart attack Brother 72   Lupus Sister    Arthritis Sister    Asthma Sister    Depression Sister    Diabetes Brother    Hypertension Brother    Obesity Brother    Early death Brother    79 / Stillbirths Sister    Stroke Maternal Grandfather    Breast cancer Neg Hx     Social History   Socioeconomic History   Marital status: Married     Spouse name: Georgena Spurling    Number of children: 1   Years of education: Not on file   Highest education level: Some college, no degree  Occupational History    Comment: works part time at [employer]  Tobacco Use   Smoking status: Never   Smokeless tobacco: Never  Vaping Use   Vaping Use: Never used  Substance  and Sexual Activity   Alcohol use: No    Alcohol/week: 0.0 standard drinks    Comment: RARELY   Drug use: No   Sexual activity: Never  Other Topics Concern   Not on file  Social History Narrative   Not on file   Social Determinants of Health   Financial Resource Strain: Low Risk    Difficulty of Paying Living Expenses: Not hard at all  Food Insecurity: No Food Insecurity   Worried About Charity fundraiser in the Last Year: Never true   [place] in the Last Year: Never true  Transportation Needs: No Transportation Needs   Lack of Transportation (Medical): No   Lack of Transportation (Non-Medical): No  Physical Activity: Insufficiently Active   Days of Exercise per Week: 1 day   Minutes of Exercise per Session: 30 min  Stress: No Stress Concern Present   Feeling of Stress : Only a little  Social Connections: Engineer, building services of Communication with Friends and Family: Once a week   Frequency of Social Gatherings with Friends and Family: Three times a week   Attends Religious Services: More than 4 times per year   Active Member of Clubs or Organizations: Yes   Attends Archivist Meetings: 1 to 4 times per year   Marital Status: Married  Human resources officer Violence: Not At Risk   Fear of Current or Ex-Partner: No   Emotionally Abused: No   Physically Abused: No   Sexually Abused: No     Current Outpatient Medications:    albuterol (VENTOLIN HFA) 108 (90 Base) MCG/ACT inhaler, Inhale 2 puffs into the lungs every 6 (six) hours as needed for wheezing or shortness of breath., Disp: 1 each, Rfl: 2   DULoxetine (CYMBALTA) 30 MG capsule, Take 1  capsule (30 mg total) by mouth daily., Disp: 90 capsule, Rfl: 0   ezetimibe (ZETIA) 10 MG tablet, Take 1 tablet (10 mg total) by mouth daily., Disp: 90 tablet, Rfl: 0   hydrOXYzine (ATARAX/VISTARIL) 10 MG tablet, Take 1 tablet (10 mg total) by mouth 3 (three) times daily as needed., Disp: 30 tablet, Rfl: 0   levothyroxine (SYNTHROID) 100 MCG tablet, TAKE 1 TABLET(100 MCG) BY MOUTH DAILY, Disp: 90 tablet, Rfl: 2   metoprolol succinate (TOPROL-XL) 25 MG 24 hr tablet, TAKE 1 TABLET(25 MG) BY MOUTH DAILY, Disp: 90 tablet, Rfl: 0   montelukast (SINGULAIR) 10 MG tablet, Take 1 tablet (10 mg total) by mouth at bedtime., Disp: 90 tablet, Rfl: 0   Multiple Vitamins-Minerals (HAIR SKIN NAILS PO), Take 1 tablet by mouth daily., Disp: , Rfl:    omeprazole (PRILOSEC) 20 MG capsule, Take 1 capsule (20 mg total) by mouth daily., Disp: 90 capsule, Rfl: 0   valsartan (DIOVAN) 160 MG tablet, Take 1 tablet (160 mg total) by mouth daily., Disp: 90 tablet, Rfl: 0   Zoster Vaccine Adjuvanted (SHINGRIX) injection, Inject 0.5 mLs into the muscle once for 1 dose. Repeat in 2 months, Disp: 0.5 mL, Rfl: 1   meloxicam (MOBIC) 15 MG tablet, Take 15 mg by mouth daily., Disp: , Rfl:   Allergies  Allergen Reactions   Vascepa [Icosapent Ethyl] Hives and Swelling   Aspir-81 [Aspirin] Other (See Comments)    Coagulation disorder   Statins Other (See Comments)   Topamax [Topiramate] Other (See Comments)    Cognitive issues    Voltaren [Diclofenac Sodium] Hives and Other (See Comments)   Lipitor [Atorvastatin]  Joint pain     ROS  Constitutional: Negative for fever or weight change.  Respiratory: Positive for cough and  shortness of breath -recently asthma not as controlled when humid and hot    Cardiovascular: Negative for chest pain or palpitations.  Gastrointestinal: Negative for abdominal pain, no bowel changes.  Musculoskeletal: positive for gait problem but no joint swelling.  Skin: Negative for rash.   Neurological: Negative for dizziness or headache.  No other specific complaints in a complete review of systems (except as listed in HPI above).   Objective  Vitals:   10/10/20 0952  BP: 130/86  Pulse: 86  Resp: 16  Temp: 97.9 F (36.6 C)  TempSrc: Oral  SpO2: 98%  Weight: 270 lb (122.5 kg)  Height: $Remove'5\' 6"'AQzELwj$  (1.676 m)    Body mass index is 43.58 kg/m.  Physical Exam  Constitutional: Patient appears well-developed and well-nourished. No distress.  HENT: Head: Normocephalic and atraumatic. Ears: B TMs ok, no erythema or effusion; Nose: Nose normal. Mouth/Throat: Oropharynx is clear and moist. No oropharyngeal exudate.  Eyes: Conjunctivae and EOM are normal. Pupils are equal, round, and reactive to light. No scleral icterus.  Neck: Normal range of motion. Neck supple. No JVD present. No thyromegaly present.  Cardiovascular: Normal rate, regular rhythm and normal heart sounds.  No murmur heard. Trace BLE edema. Varicose veins  Pulmonary/Chest: Effort normal and breath sounds normal. No respiratory distress. Abdominal: Soft. Bowel sounds are normal, no distension. There is no tenderness. no masses Breast: no lumps or masses, no nipple discharge or rashes FEMALE GENITALIA:  Not done  RECTAL: not done  Musculoskeletal: Normal range of motion, no joint effusions. No gross deformities Neurological: he is alert and oriented to person, place, and time. No cranial nerve deficit. Coordination, balance, strength, speech and gait are normal.  Skin: Skin is warm and dry. No rash noted. No erythema.  Psychiatric: Patient has a normal mood and affect. behavior is normal. Judgment and thought content normal.   Fall Risk: Fall Risk  10/10/2020 06/26/2020 06/03/2020 05/27/2020 05/23/2020  Falls in the past year? 1 0 0 1 1  Number falls in past yr: 0 0 0 0 0  Injury with Fall? 1 - 0 0 0  Risk for fall due to : - - - No Fall Risks -  Follow up - Falls prevention discussed - Falls prevention discussed -      Functional Status Survey: Is the patient deaf or have difficulty hearing?: Yes Does the patient have difficulty seeing, even when wearing glasses/contacts?: No Does the patient have difficulty concentrating, remembering, or making decisions?: Yes Does the patient have difficulty walking or climbing stairs?: Yes Does the patient have difficulty dressing or bathing?: No Does the patient have difficulty doing errands alone such as visiting a doctor's office or shopping?: No   Assessment & Plan  1. Well adult exam   2. Need for shingles vaccine  - Zoster Vaccine Adjuvanted [facility]) injection; Inject 0.5 mLs into the muscle once for 1 dose. Repeat in 2 months  Dispense: 0.5 mL; Refill: 1  3. Need for hepatitis B vaccination  - Hepatitis B vaccine adult IM  4. Other specified hypothyroidism  - TSH    -USPSTF grade A and B recommendations reviewed with patient; age-appropriate recommendations, preventive care, screening tests, etc discussed and encouraged; healthy living encouraged; see AVS for patient education given to patient -Discussed importance of 150 minutes of physical activity weekly, eat two servings of fish weekly, eat  one serving of tree nuts ( cashews, pistachios, pecans, almonds.Marland Kitchen) every other day, eat 6 servings of fruit/vegetables daily and drink plenty of water and avoid sweet beverages.

## 2020-10-10 ENCOUNTER — Ambulatory Visit (INDEPENDENT_AMBULATORY_CARE_PROVIDER_SITE_OTHER): Payer: Medicare HMO | Admitting: Family Medicine

## 2020-10-10 ENCOUNTER — Encounter: Payer: Self-pay | Admitting: Family Medicine

## 2020-10-10 ENCOUNTER — Other Ambulatory Visit: Payer: Self-pay

## 2020-10-10 VITALS — BP 130/86 | HR 86 | Temp 97.9°F | Resp 16 | Ht 66.0 in | Wt 270.0 lb

## 2020-10-10 DIAGNOSIS — Z23 Encounter for immunization: Secondary | ICD-10-CM | POA: Diagnosis not present

## 2020-10-10 DIAGNOSIS — E038 Other specified hypothyroidism: Secondary | ICD-10-CM | POA: Diagnosis not present

## 2020-10-10 DIAGNOSIS — Z0000 Encounter for general adult medical examination without abnormal findings: Secondary | ICD-10-CM

## 2020-10-10 LAB — TSH: TSH: 0.91 mIU/L (ref 0.40–4.50)

## 2020-10-10 MED ORDER — SHINGRIX 50 MCG/0.5ML IM SUSR
0.5000 mL | Freq: Once | INTRAMUSCULAR | 1 refills | Status: AC
Start: 2020-10-10 — End: 2020-10-10

## 2020-10-17 DIAGNOSIS — M5412 Radiculopathy, cervical region: Secondary | ICD-10-CM | POA: Diagnosis not present

## 2020-10-17 DIAGNOSIS — M5416 Radiculopathy, lumbar region: Secondary | ICD-10-CM | POA: Diagnosis not present

## 2020-10-30 ENCOUNTER — Encounter: Payer: Self-pay | Admitting: Family Medicine

## 2020-10-31 ENCOUNTER — Encounter (INDEPENDENT_AMBULATORY_CARE_PROVIDER_SITE_OTHER): Payer: Self-pay

## 2020-10-31 ENCOUNTER — Other Ambulatory Visit: Payer: Self-pay | Admitting: Family Medicine

## 2020-10-31 DIAGNOSIS — G4733 Obstructive sleep apnea (adult) (pediatric): Secondary | ICD-10-CM

## 2020-11-06 DIAGNOSIS — M5416 Radiculopathy, lumbar region: Secondary | ICD-10-CM | POA: Insufficient documentation

## 2020-11-06 DIAGNOSIS — M5412 Radiculopathy, cervical region: Secondary | ICD-10-CM | POA: Diagnosis not present

## 2020-11-07 ENCOUNTER — Other Ambulatory Visit: Payer: Self-pay | Admitting: Family Medicine

## 2020-11-07 DIAGNOSIS — E1169 Type 2 diabetes mellitus with other specified complication: Secondary | ICD-10-CM

## 2020-11-07 DIAGNOSIS — E038 Other specified hypothyroidism: Secondary | ICD-10-CM

## 2020-11-07 DIAGNOSIS — I10 Essential (primary) hypertension: Secondary | ICD-10-CM

## 2020-11-07 DIAGNOSIS — E785 Hyperlipidemia, unspecified: Secondary | ICD-10-CM

## 2020-11-07 DIAGNOSIS — K219 Gastro-esophageal reflux disease without esophagitis: Secondary | ICD-10-CM

## 2020-11-07 MED ORDER — OMEPRAZOLE 20 MG PO CPDR: 20.0000 mg | DELAYED_RELEASE_CAPSULE | Freq: Every day | ORAL | 0 refills | Status: DC

## 2020-11-07 MED ORDER — LEVOTHYROXINE SODIUM 100 MCG PO TABS: ORAL_TABLET | ORAL | 0 refills | Status: DC

## 2020-11-07 MED ORDER — VALSARTAN 160 MG PO TABS
160.0000 mg | ORAL_TABLET | Freq: Every day | ORAL | 0 refills | Status: DC
Start: 2020-11-07 — End: 2020-11-25

## 2020-11-07 NOTE — Telephone Encounter (Signed)
Requested medication (s) are due for refill today: yes   Requested medication (s) are on the active medication list: yes   Last refill:  08/14/2020  Future visit scheduled: yes   Notes to clinic:  Failed protocol:  Total Cholesterol in normal range and within 360 days   LDL in normal range and within 360 days   HDL in normal range and within 360 da     Requested Prescriptions  Pending Prescriptions Disp Refills   ezetimibe (ZETIA) 10 MG tablet 90 tablet 0    Sig: Take 1 tablet (10 mg total) by mouth daily.     Cardiovascular:  Antilipid - Sterol Transport Inhibitors Failed - 11/07/2020  7:43 AM      Failed - Total Cholesterol in normal range and within 360 days    Cholesterol, Total  Date Value Ref Range Status  09/11/2014 161 100 - 199 mg/dL Final   Cholesterol  Date Value Ref Range Status  05/23/2020 232 (H) <200 mg/dL Final          Failed - LDL in normal range and within 360 days    LDL Cholesterol (Calc)  Date Value Ref Range Status  05/23/2020 160 (H) mg/dL (calc) Final    Comment:    Reference range: <100 . Desirable range <100 mg/dL for primary prevention;   <70 mg/dL for patients with CHD or diabetic patients  with > or = 2 CHD risk factors. Marland Kitchen LDL-C is now calculated using the Martin-Hopkins  calculation, which is a validated novel method providing  better accuracy than the Friedewald equation in the  estimation of LDL-C.  Cresenciano Genre et al. Annamaria Helling. [id number]): 2061-2068  (http://education.[facility].com/faq/FAQ164)           Failed - HDL in normal range and within 360 days    HDL  Date Value Ref Range Status  05/23/2020 46 (L) > OR = 50 mg/dL Final  09/11/2014 45 >39 mg/dL Final    Comment:    According to ATP-III Guidelines, HDL-C >59 mg/dL is considered a negative risk factor for CHD.           Passed - Triglycerides in normal range and within 360 days    Triglycerides  Date Value Ref Range Status  05/23/2020 134 <150 mg/dL Final           Passed - Valid encounter within last 12 months    Recent Outpatient Visits           4 weeks ago Well adult exam   [facility] Steele Sizer, MD   5 months ago Subacute maxillary sinusitis   [facility] [place], Drue Stager, MD   5 months ago Controlled type 2 diabetes with neuropathy [facility])   [facility] Steele Sizer, MD   9 months ago Controlled type 2 diabetes with neuropathy [facility])   [facility] Sizer, MD   1 year ago URI, acute   [facility] Steele Sizer, MD       Future Appointments             In 2 weeks Steele Sizer, MD [facility] [place]   In 6 months  [facility]            Signed Prescriptions Disp Refills   omeprazole (PRILOSEC) 20 MG capsule 90 capsule 0    Sig: Take 1 capsule (20 mg total) by mouth daily.  Gastroenterology: Proton Pump Inhibitors Passed - 11/07/2020  7:43 AM      Passed - Valid encounter within last 12 months    Recent Outpatient Visits           4 weeks ago Well adult exam   [facility] Steele Sizer, MD   5 months ago Subacute maxillary sinusitis   [facility], Drue Stager, MD   5 months ago Controlled type 2 diabetes with neuropathy [facility])   [facility] Steele Sizer, MD   9 months ago Controlled type 2 diabetes with neuropathy [facility])   [facility] Sizer, MD   1 year ago URI, acute   [facility] Steele Sizer, MD       Future Appointments             In 2 weeks Steele Sizer, MD [facility] [place]   In 6 months  [facility]             valsartan (DIOVAN) 160 MG tablet 90 tablet 0    Sig: Take 1 tablet (160 mg total) by mouth daily.     There is no refill protocol information for this order      levothyroxine (SYNTHROID) 100 MCG tablet 90 tablet 0    Sig: TAKE 1 TABLET(100 MCG) BY MOUTH DAILY     There is no refill protocol information for this order

## 2020-11-07 NOTE — Telephone Encounter (Signed)
Medication Refill - Medication:  omeprazole (PRILOSEC) 20 MG capsule  ezetimibe (ZETIA) 10 MG tablet  valsartan (DIOVAN) 160 MG tablet  levothyroxine (SYNTHROID) 100 MCG tablet  Has the patient contacted their pharmacy? Yes, no refills on file, contact pcp  Preferred Pharmacy (with phone number or street name):  [facility] [place], [address]  [place], [place] [address]  Phone:  [phone]  Fax:  [phone]  Agent: Please be advised that RX refills may take up to 3 business days. We ask that you follow-up with your pharmacy.

## 2020-11-07 NOTE — Telephone Encounter (Signed)
Last seen 7.22.2022 next sch'd 9.6.2022

## 2020-11-10 MED ORDER — EZETIMIBE 10 MG PO TABS: 10.0000 mg | ORAL_TABLET | Freq: Every day | ORAL | 0 refills | Status: DC

## 2020-11-14 DIAGNOSIS — M5412 Radiculopathy, cervical region: Secondary | ICD-10-CM | POA: Diagnosis not present

## 2020-11-14 DIAGNOSIS — M5416 Radiculopathy, lumbar region: Secondary | ICD-10-CM | POA: Diagnosis not present

## 2020-11-18 ENCOUNTER — Telehealth: Payer: Self-pay

## 2020-11-18 NOTE — Telephone Encounter (Signed)
Called to remind patient to schedule her dexa/mammo, schedulinginformation provided and patient expressed she would schedule soon.

## 2020-11-21 NOTE — Progress Notes (Signed)
Name: Sarah Gordon   MRN: [id number]    DOB: [birth year]   Date:11/25/2020       Progress Note  Subjective  Chief Complaint  Follow Up  HPI  HTN: bp is still elevated today,  she is compliant with medication, denies chest pain or palpitation. Since we added metoprolol heart rate has been at goal she is also on valsartan . No side effects    Ventricular flutter: seen by cardiologist, medication for thyroid adjusted and feeling well since. She states no recent episodes of palpitation . Unchanged   Paresthesia on left maxillary area: comes and goes , no pain, no ear pain, no weakness. Symptoms resolved   MDD: she is feeling better on duloxetine and denies.  Last visit she was noticing that she was feeling  numb, could not cry during a sad movie, she went down on Duloxetine dose from 60 mg to 30 mg, however since back pain returned and also having neck pain she would like to try going up to 60 mg daily   DMII: doing well on life style modification. A1C at goal. Denies polyphagia, polydipsia or polyuria She has a history of neuropathy, very mild symptoms, A1C is stable but trending up at 6 %  she is on Zetia for dyslipidemia. She is on Medrol dose pack at this time    DDD lumbar spine: she had MRI done back in 2010 and showed herniated disc, she had steroid injections twice and it helped. However pain is back , she has been using a walker now. She staes the pain is worse on the right lower back, but can radiate both legs. Also having radiculitis on right side of neck , down to her arm. She is going to see Dr. Kayleen Memos at [facility] next visit   OA: knees and feet, also has FMS, she states her legs are doing better, except for when radiates down her legs.    Asthma: She has occasional dry cough, SOB and wheezing a couple of times a month , she uses albuterol prn for that . Unchanged    Morbid Obesity: BMI above 40, weight is stable, weight is stable.    Hypothyroidism: she is taking medication  daily, denies hair loss, she has chronic dry skin and chronic constipation Last TSH is at goal  GERD: doing well on Omeprazole   Patient Active Problem List   Diagnosis Date Noted   Lumbar radiculopathy 11/06/2020   Ventricular flutter ([place]) 12/06/2018   MDD (major depressive disorder), recurrent episode, mild ([place]) 09/04/2018   DDD (degenerative disc disease), cervical 01/23/2016   Degenerative joint disease of left acromioclavicular joint 01/23/2016   Recurrent oral herpes simplex infection 09/01/2015   Irritable bowel syndrome with diarrhea 08/07/2015   Statin intolerance 02/28/2015   Asthma, intermittent 09/10/2014   Controlled type 2 diabetes with neuropathy ([place]) 09/10/2014   Fibromyalgia 09/10/2014   Obstructive sleep apnea 09/10/2014   Hyperlipidemia 12/29/2012   Essential hypertension 12/29/2012   Morbid obesity ([place]) 12/29/2012   Arthritis due to gout 12/13/2007   Acid reflux 10/24/2006   Adult hypothyroidism 10/24/2006   Localized osteoarthrosis 10/24/2006    Past Surgical History:  Procedure Laterality Date   BREAST BIOPSY Left    benign   COLONOSCOPY     Dr Alveta Heimlich   COLONOSCOPY WITH PROPOFOL N/A 11/19/2015   Procedure: COLONOSCOPY WITH PROPOFOL;  Surgeon: Christene Lye, MD;  Location: [facility] ENDOSCOPY;  Service: Endoscopy;  Laterality: N/A;   ESOPHAGOGASTRODUODENOSCOPY N/A 08/20/2014  Procedure: ESOPHAGOGASTRODUODENOSCOPY (EGD);  Surgeon: Christene Lye, MD;  Location: [facility] ENDOSCOPY;  Service: Endoscopy;  Laterality: N/A;   FOOT SURGERY Left    heel spur   HERNIA REPAIR     umbilical   NASAL SINUS SURGERY  [id number]   UMBILICAL HERNIA REPAIR      Family History  Problem Relation Age of Onset   Hypertension Mother    Hyperlipidemia Mother    Diabetes Mother    Heart disease Mother    Stroke Mother    Heart disease Brother 32       CABG x 3    Heart attack Brother 67   Lupus Sister    Arthritis Sister    Asthma Sister    Depression Sister     Diabetes Brother    Hypertension Brother    Obesity Brother    Early death Brother    54 / Stillbirths Sister    Stroke Maternal Grandfather    Breast cancer Neg Hx     Social History   Tobacco Use   Smoking status: Never   Smokeless tobacco: Never  Substance Use Topics   Alcohol use: No    Alcohol/week: 0.0 standard drinks    Comment: RARELY     Current Outpatient Medications:    albuterol (VENTOLIN HFA) 108 (90 Base) MCG/ACT inhaler, Inhale 2 puffs into the lungs every 6 (six) hours as needed for wheezing or shortness of breath., Disp: 1 each, Rfl: 2   DULoxetine (CYMBALTA) 30 MG capsule, Take 1 capsule (30 mg total) by mouth daily., Disp: 90 capsule, Rfl: 0   ezetimibe (ZETIA) 10 MG tablet, Take 1 tablet (10 mg total) by mouth daily., Disp: 90 tablet, Rfl: 0   gabapentin (NEURONTIN) 300 MG capsule, Take 300 mg by mouth 3 (three) times daily., Disp: , Rfl:    hydrOXYzine (ATARAX/VISTARIL) 10 MG tablet, Take 1 tablet (10 mg total) by mouth 3 (three) times daily as needed., Disp: 30 tablet, Rfl: 0   levothyroxine (SYNTHROID) 100 MCG tablet, TAKE 1 TABLET(100 MCG) BY MOUTH DAILY, Disp: 90 tablet, Rfl: 0   metoprolol succinate (TOPROL-XL) 25 MG 24 hr tablet, TAKE 1 TABLET(25 MG) BY MOUTH DAILY, Disp: 90 tablet, Rfl: 0   montelukast (SINGULAIR) 10 MG tablet, Take 1 tablet (10 mg total) by mouth at bedtime., Disp: 90 tablet, Rfl: 0   Multiple Vitamins-Minerals (HAIR SKIN NAILS PO), Take 1 tablet by mouth daily., Disp: , Rfl:    naproxen (NAPROSYN) 500 MG tablet, Take 500 mg by mouth 2 (two) times daily with a meal., Disp: , Rfl:    omeprazole (PRILOSEC) 20 MG capsule, Take 1 capsule (20 mg total) by mouth daily., Disp: 90 capsule, Rfl: 0   traMADol (ULTRAM) 50 MG tablet, Take by mouth., Disp: , Rfl:    valsartan (DIOVAN) 160 MG tablet, Take 1 tablet (160 mg total) by mouth daily., Disp: 90 tablet, Rfl: 0   meloxicam (MOBIC) 15 MG tablet, Take 15 mg by mouth daily. (Patient  not taking: Reported on 11/25/2020), Disp: , Rfl:   Allergies  Allergen Reactions   Vascepa [Icosapent Ethyl] Hives and Swelling   Aspir-81 [Aspirin] Other (See Comments)    Coagulation disorder   Statins Other (See Comments)   Topamax [Topiramate] Other (See Comments)    Cognitive issues    Voltaren [Diclofenac Sodium] Hives and Other (See Comments)   Lipitor [Atorvastatin]     Joint pain    I personally reviewed active problem list, medication  list, allergies, family history, social history, health maintenance with the patient/caregiver today.   ROS  Constitutional: Negative for fever or weight change.  Respiratory: Negative for cough and shortness of breath.   Cardiovascular: Negative for chest pain or palpitations.  Gastrointestinal: Negative for abdominal pain, no bowel changes.  Musculoskeletal: Negative for gait problem or joint swelling.  Skin: Negative for rash.  Neurological: Negative for dizziness or headache.  No other specific complaints in a complete review of systems (except as listed in HPI above).   Objective  Vitals:   11/25/20 0904  BP: 132/82  Pulse: 91  Resp: 16  Temp: 98.3 F (36.8 C)  SpO2: 97%  Weight: 272 lb (123.4 kg)  Height: '5\' 6"'$  (1.676 m)    Body mass index is 43.9 kg/m.  Physical Exam  Constitutional: Patient appears well-developed and well-nourished. Obese  No distress.  HEENT: head atraumatic, normocephalic, pupils equal and reactive to light, neck supple Cardiovascular: Normal rate, regular rhythm and normal heart sounds.  No murmur heard. No BLE edema. Pulmonary/Chest: Effort normal and breath sounds normal. No respiratory distress. Abdominal: Soft.  There is no tenderness. Psychiatric: Patient has a normal mood and affect. behavior is normal. Judgment and thought content normal.   Recent Results (from the past 2160 hour(s))  TSH     Status: None   Collection Time: 10/10/20 10:54 AM  Result Value Ref Range   TSH 0.91 0.40 -  4.50 mIU/L  POCT HgB A1C     Status: Abnormal   Collection Time: 11/25/20  9:14 AM  Result Value Ref Range   Hemoglobin A1C 6.0 (A) 4.0 - 5.6 %   HbA1c POC (<> result, manual entry)     HbA1c, POC (prediabetic range)     HbA1c, POC (controlled diabetic range)        PHQ2/9: Depression screen [facility] 2/9 11/25/2020 10/10/2020 06/03/2020 05/27/2020 05/23/2020  Decreased Interest 1 0 0 0 0  Down, Depressed, Hopeless 1 1 0 0 0  PHQ - 2 Score 2 1 0 0 0  Altered sleeping '1 1 1 '$ 0 0  Tired, decreased energy '2 3 1 1 1  '$ Change in appetite 0 0 0 1 1  Feeling bad or failure about yourself  0 0 0 0 0  Trouble concentrating 0 0 0 0 0  Moving slowly or fidgety/restless 0 0 0 0 0  Suicidal thoughts 0 0 0 0 0  PHQ-9 Score '5 5 2 2 2  '$ Difficult doing work/chores - - - - -  Some recent data might be hidden    phq 9 is positive   Fall Risk: Fall Risk  11/25/2020 10/10/2020 06/26/2020 06/03/2020 05/27/2020  Falls in the past year? 1 1 0 0 1  Number falls in past yr: 1 0 0 0 0  Injury with Fall? 1 1 - 0 0  Risk for fall due to : Impaired balance/gait - - - No Fall Risks  Follow up Falls prevention discussed - Falls prevention discussed - Falls prevention discussed      Functional Status Survey: Is the patient deaf or have difficulty hearing?: No Does the patient have difficulty seeing, even when wearing glasses/contacts?: No Does the patient have difficulty concentrating, remembering, or making decisions?: No Does the patient have difficulty walking or climbing stairs?: Yes Does the patient have difficulty dressing or bathing?: No Does the patient have difficulty doing errands alone such as visiting a doctor's office or shopping?: No    Assessment &  Plan  1. Dyslipidemia associated with type 2 diabetes mellitus (HCC)  - POCT HgB A1C - ezetimibe (ZETIA) 10 MG tablet; Take 1 tablet (10 mg total) by mouth daily.  Dispense: 90 tablet; Refill: 1 - Alirocumab (PRALUENT) 75 MG/ML SOAJ; Inject 75 mg into the  skin every 14 (fourteen) days.  Dispense: 6 mL; Refill: 1  2. Adult hypothyroidism  - levothyroxine (SYNTHROID) 100 MCG tablet; TAKE 1 TABLET(100 MCG) BY MOUTH DAILY  Dispense: 90 tablet; Refill: 0  3. Ventricular flutter (HCC)  - metoprolol succinate (TOPROL-XL) 25 MG 24 hr tablet; TAKE 1 TABLET(25 MG) BY MOUTH DAILY  Dispense: 90 tablet; Refill: 1  4. Mild intermittent asthma without complication  - montelukast (SINGULAIR) 10 MG tablet; Take 1 tablet (10 mg total) by mouth at bedtime.  Dispense: 90 tablet; Refill: 1  5. Gastroesophageal reflux disease without esophagitis  - omeprazole (PRILOSEC) 20 MG capsule; Take 1 capsule (20 mg total) by mouth daily.  Dispense: 90 capsule; Refill: 1  6. Essential hypertension  - valsartan (DIOVAN) 160 MG tablet; Take 1 tablet (160 mg total) by mouth daily.  Dispense: 90 tablet; Refill: 1  7. MDD (recurrent major depressive disorder) in remission (HCC)  - DULoxetine (CYMBALTA) 60 MG capsule; Take 1 capsule (60 mg total) by mouth daily.  Dispense: 90 capsule; Refill: 1

## 2020-11-23 DIAGNOSIS — M5412 Radiculopathy, cervical region: Secondary | ICD-10-CM | POA: Diagnosis not present

## 2020-11-25 ENCOUNTER — Ambulatory Visit (INDEPENDENT_AMBULATORY_CARE_PROVIDER_SITE_OTHER): Payer: Medicare HMO | Admitting: Family Medicine

## 2020-11-25 ENCOUNTER — Other Ambulatory Visit: Payer: Self-pay

## 2020-11-25 ENCOUNTER — Encounter: Payer: Self-pay | Admitting: Family Medicine

## 2020-11-25 VITALS — BP 132/82 | HR 91 | Temp 98.3°F | Resp 16 | Ht 66.0 in | Wt 272.0 lb

## 2020-11-25 DIAGNOSIS — K219 Gastro-esophageal reflux disease without esophagitis: Secondary | ICD-10-CM | POA: Diagnosis not present

## 2020-11-25 DIAGNOSIS — I10 Essential (primary) hypertension: Secondary | ICD-10-CM | POA: Diagnosis not present

## 2020-11-25 DIAGNOSIS — I4902 Ventricular flutter: Secondary | ICD-10-CM

## 2020-11-25 DIAGNOSIS — R69 Illness, unspecified: Secondary | ICD-10-CM | POA: Diagnosis not present

## 2020-11-25 DIAGNOSIS — E785 Hyperlipidemia, unspecified: Secondary | ICD-10-CM

## 2020-11-25 DIAGNOSIS — Z23 Encounter for immunization: Secondary | ICD-10-CM | POA: Diagnosis not present

## 2020-11-25 DIAGNOSIS — E1169 Type 2 diabetes mellitus with other specified complication: Secondary | ICD-10-CM

## 2020-11-25 DIAGNOSIS — F3340 Major depressive disorder, recurrent, in remission, unspecified: Secondary | ICD-10-CM

## 2020-11-25 DIAGNOSIS — E039 Hypothyroidism, unspecified: Secondary | ICD-10-CM

## 2020-11-25 DIAGNOSIS — J4520 Mild intermittent asthma, uncomplicated: Secondary | ICD-10-CM

## 2020-11-25 LAB — POCT GLYCOSYLATED HEMOGLOBIN (HGB A1C): Hemoglobin A1C: 6 % — AB (ref 4.0–5.6)

## 2020-11-25 MED ORDER — METOPROLOL SUCCINATE ER 25 MG PO TB24: ORAL_TABLET | ORAL | 1 refills | Status: DC

## 2020-11-25 MED ORDER — VALSARTAN 160 MG PO TABS: 160.0000 mg | ORAL_TABLET | Freq: Every day | ORAL | 1 refills | Status: DC

## 2020-11-25 MED ORDER — LEVOTHYROXINE SODIUM 100 MCG PO TABS: ORAL_TABLET | ORAL | 0 refills | Status: DC

## 2020-11-25 MED ORDER — MONTELUKAST SODIUM 10 MG PO TABS: 10.0000 mg | ORAL_TABLET | Freq: Every day | ORAL | 1 refills | Status: DC

## 2020-11-25 MED ORDER — PRALUENT 75 MG/ML ~~LOC~~ SOAJ: 75.0000 mg | SUBCUTANEOUS | 1 refills | Status: DC

## 2020-11-25 MED ORDER — EZETIMIBE 10 MG PO TABS: 10.0000 mg | ORAL_TABLET | Freq: Every day | ORAL | 1 refills | Status: DC

## 2020-11-25 MED ORDER — DULOXETINE HCL 60 MG PO CPEP: 60.0000 mg | ORAL_CAPSULE | Freq: Every day | ORAL | 1 refills | Status: DC

## 2020-11-25 MED ORDER — OMEPRAZOLE 20 MG PO CPDR: 20.0000 mg | DELAYED_RELEASE_CAPSULE | Freq: Every day | ORAL | 1 refills | Status: DC

## 2020-11-28 DIAGNOSIS — M5416 Radiculopathy, lumbar region: Secondary | ICD-10-CM | POA: Diagnosis not present

## 2020-11-28 DIAGNOSIS — M5412 Radiculopathy, cervical region: Secondary | ICD-10-CM | POA: Diagnosis not present

## 2020-11-28 DIAGNOSIS — M25511 Pain in right shoulder: Secondary | ICD-10-CM | POA: Diagnosis not present

## 2020-12-02 ENCOUNTER — Other Ambulatory Visit
Admission: RE | Admit: 2020-12-02 | Discharge: 2020-12-02 | Disposition: A | Discharge status: Home / Self Care | Payer: Medicare HMO | Source: Ambulatory Visit | Attending: Family Medicine | Admitting: Family Medicine

## 2020-12-02 ENCOUNTER — Other Ambulatory Visit: Payer: Self-pay

## 2020-12-02 DIAGNOSIS — Z01812 Encounter for preprocedural laboratory examination: Secondary | ICD-10-CM | POA: Diagnosis present

## 2020-12-02 DIAGNOSIS — Z20822 Contact with and (suspected) exposure to covid-19: Secondary | ICD-10-CM | POA: Diagnosis not present

## 2020-12-02 LAB — SARS CORONAVIRUS 2 (TAT 6-24 HRS): SARS Coronavirus 2: NEGATIVE

## 2020-12-03 ENCOUNTER — Ambulatory Visit: Payer: Medicare HMO | Attending: Neurology

## 2020-12-03 DIAGNOSIS — G4733 Obstructive sleep apnea (adult) (pediatric): Secondary | ICD-10-CM | POA: Insufficient documentation

## 2020-12-04 ENCOUNTER — Other Ambulatory Visit: Payer: Self-pay

## 2020-12-05 DIAGNOSIS — M5412 Radiculopathy, cervical region: Secondary | ICD-10-CM | POA: Diagnosis not present

## 2020-12-05 DIAGNOSIS — M5416 Radiculopathy, lumbar region: Secondary | ICD-10-CM | POA: Diagnosis not present

## 2020-12-09 DIAGNOSIS — M5412 Radiculopathy, cervical region: Secondary | ICD-10-CM | POA: Diagnosis not present

## 2020-12-09 DIAGNOSIS — M5416 Radiculopathy, lumbar region: Secondary | ICD-10-CM | POA: Diagnosis not present

## 2021-01-08 ENCOUNTER — Ambulatory Visit: Payer: Medicare HMO

## 2021-01-09 DIAGNOSIS — M5412 Radiculopathy, cervical region: Secondary | ICD-10-CM | POA: Diagnosis not present

## 2021-01-13 ENCOUNTER — Telehealth: Payer: Medicare HMO | Admitting: Physician Assistant

## 2021-01-13 DIAGNOSIS — J209 Acute bronchitis, unspecified: Secondary | ICD-10-CM

## 2021-01-13 MED ORDER — BENZONATATE 100 MG PO CAPS: 100.0000 mg | ORAL_CAPSULE | Freq: Three times a day (TID) | ORAL | 0 refills | Status: DC | PRN

## 2021-01-13 NOTE — Progress Notes (Signed)
I have spent 5 minutes in review of e-visit questionnaire, review and updating patient chart, medical decision making and response to patient.   Avielle Imbert Cody Joi Leyva, PA-C    

## 2021-01-13 NOTE — Progress Notes (Signed)
We are sorry that you are not feeling well.  Here is how we plan to help!  Based on your presentation I believe you most likely have A cough due to a virus.  This is called viral bronchitis and is best treated by rest, plenty of fluids and control of the cough.  You may use Ibuprofen or Tylenol as directed to help your symptoms.     In addition you may use A prescription cough medication called Tessalon Perles 100mg . You may take 1-2 capsules every 8 hours as needed for your cough.  Use your albuterol inhaler as directed if needed for chest tightness, etc.   From your responses in the eVisit questionnaire you describe inflammation in the upper respiratory tract which is causing a significant cough.  This is commonly called Bronchitis and has four common causes:   Allergies Viral Infections Acid Reflux Bacterial Infection Allergies, viruses and acid reflux are treated by controlling symptoms or eliminating the cause. An example might be a cough caused by taking certain blood pressure medications. You stop the cough by changing the medication. Another example might be a cough caused by acid reflux. Controlling the reflux helps control the cough.  USE OF BRONCHODILATOR ("RESCUE") INHALERS: There is a risk from using your bronchodilator too frequently.  The risk is that over-reliance on a medication which only relaxes the muscles surrounding the breathing tubes can reduce the effectiveness of medications prescribed to reduce swelling and congestion of the tubes themselves.  Although you feel brief relief from the bronchodilator inhaler, your asthma may actually be worsening with the tubes becoming more swollen and filled with mucus.  This can delay other crucial treatments, such as oral steroid medications. If you need to use a bronchodilator inhaler daily, several times per day, you should discuss this with your provider.  There are probably better treatments that could be used to keep your asthma under  control.     HOME CARE Only take medications as instructed by your medical team. Complete the entire course of an antibiotic. Drink plenty of fluids and get plenty of rest. Avoid close contacts especially the very young and the elderly Cover your mouth if you cough or cough into your sleeve. Always remember to wash your hands A steam or ultrasonic humidifier can help congestion.   GET HELP RIGHT AWAY IF: You develop worsening fever. You become short of breath You cough up blood. Your symptoms persist after you have completed your treatment plan MAKE SURE YOU  Understand these instructions. Will watch your condition. Will get help right away if you are not doing well or get worse.    Thank you for choosing an e-visit.  Your e-visit answers were reviewed by a board certified advanced clinical practitioner to complete your personal care plan. Depending upon the condition, your plan could have included both over the counter or prescription medications.  Please review your pharmacy choice. Make sure the pharmacy is open so you can pick up prescription now. If there is a problem, you may contact your provider through [employer] and have the prescription routed to another pharmacy.  Your safety is important to [place]. If you have drug allergies check your prescription carefully.   For the next 24 hours you can use MyChart to ask questions about today's visit, request a non-urgent call back, or ask for a work or school excuse. You will get an email in the next two days asking about your experience. I hope that your e-visit  has been valuable and will speed your recovery.

## 2021-01-15 MED ORDER — FLUTICASONE PROPIONATE HFA 44 MCG/ACT IN AERO: 1.0000 | INHALATION_SPRAY | Freq: Two times a day (BID) | RESPIRATORY_TRACT | 0 refills | Status: DC

## 2021-01-15 MED ORDER — PREDNISONE 20 MG PO TABS: 40.0000 mg | ORAL_TABLET | Freq: Every day | ORAL | 0 refills | Status: DC

## 2021-01-15 NOTE — Addendum Note (Signed)
Addended by: Mar Daring on: 01/15/2021 10:39 AM   Modules accepted: Orders

## 2021-01-15 NOTE — Progress Notes (Signed)
Prednisone added for worsening symptoms.

## 2021-01-15 NOTE — Addendum Note (Signed)
Addended by: Mar Daring on: 01/15/2021 10:45 AM   Modules accepted: Orders

## 2021-02-02 ENCOUNTER — Encounter: Payer: Self-pay | Admitting: Family Medicine

## 2021-02-02 ENCOUNTER — Other Ambulatory Visit: Payer: Self-pay | Admitting: Family Medicine

## 2021-02-02 DIAGNOSIS — K219 Gastro-esophageal reflux disease without esophagitis: Secondary | ICD-10-CM

## 2021-02-19 ENCOUNTER — Ambulatory Visit (INDEPENDENT_AMBULATORY_CARE_PROVIDER_SITE_OTHER): Payer: Medicare HMO | Admitting: Family Medicine

## 2021-02-19 ENCOUNTER — Encounter: Payer: Self-pay | Admitting: Family Medicine

## 2021-02-19 ENCOUNTER — Ambulatory Visit: Payer: Self-pay | Admitting: *Deleted

## 2021-02-19 VITALS — BP 132/80 | HR 89 | Temp 97.8°F | Resp 16 | Ht 66.0 in | Wt 269.0 lb

## 2021-02-19 DIAGNOSIS — K529 Noninfective gastroenteritis and colitis, unspecified: Secondary | ICD-10-CM

## 2021-02-19 MED ORDER — DICYCLOMINE HCL 10 MG PO CAPS: 10.0000 mg | ORAL_CAPSULE | Freq: Three times a day (TID) | ORAL | 0 refills | Status: DC

## 2021-02-19 MED ORDER — FAMOTIDINE 40 MG PO TABS: 40.0000 mg | ORAL_TABLET | Freq: Every day | ORAL | 0 refills | Status: DC

## 2021-02-19 NOTE — Telephone Encounter (Signed)
C/o diarrhea nausea, abdominal cramping, bloating that comes and goes since last Friday after Thanksgiving. Other people at thanksgiving meal with similar symptoms. C/o diarrhea comes and goes every few days and will return. Reports last night diarrhea x 3 and 3 times since this am now. Denies fever, dizziness , weakness, blood in stool. Able to urinate WNL. Denies vomiting since Monday but c/o nausea. Able to eat and drink but not much. Hx chronic constipation and gluten intolerance but reports , "this is different". Took at home covid test and was negative Tuesday. Took some imodium and diarrhea decreased . Encouraged patient to go to UC due to no available appt and will be placed on wait list. Patient reports she would still like appt for 02/23/21 . Care advise given. Patient verbalized understanding of care advise and to call back or go to [facility] or ED if symptoms worsen. Cassandra - FC will contact patient to see if appt can be scheduled today.

## 2021-02-19 NOTE — Telephone Encounter (Signed)
Appt scheduled for today with Dr Ancil Boozer

## 2021-02-19 NOTE — Telephone Encounter (Signed)
Reason for Disposition  [1] MODERATE diarrhea (e.g., 4-6 times / day more than normal) AND [2] present > 48 hours (2 days)  Answer Assessment - Initial Assessment Questions 1. DIARRHEA SEVERITY: "How bad is the diarrhea?" "How many more stools have you had in the past 24 hours than normal?"    - NO DIARRHEA (SCALE 0)   - MILD (SCALE 1-3): Few loose or mushy BMs; increase of 1-3 stools over normal daily number of stools; mild increase in ostomy output.   -  MODERATE (SCALE 4-7): Increase of 4-6 stools daily over normal; moderate increase in ostomy output. * SEVERE (SCALE 8-10; OR 'WORST POSSIBLE'): Increase of 7 or more stools daily over normal; moderate increase in ostomy output; incontinence.     moderate 2. ONSET: "When did the diarrhea begin?"      Last Friday after Thanksgiving  3. BM CONSISTENCY: "How loose or watery is the diarrhea?"      Watery  4. VOMITING: "Are you also vomiting?" If Yes, ask: "How many times in the past 24 hours?"      1 episode Monday . Only nausea  5. ABDOMINAL PAIN: "Are you having any abdominal pain?" If Yes, ask: "What does it feel like?" (e.g., crampy, dull, intermittent, constant)      Crampy, comes and goes , bloating  6. ABDOMINAL PAIN SEVERITY: If present, ask: "How bad is the pain?"  (e.g., Scale 1-10; mild, moderate, or severe)   - MILD (1-3): doesn't interfere with normal activities, abdomen soft and not tender to touch    - MODERATE (4-7): interferes with normal activities or awakens from sleep, abdomen tender to touch    - SEVERE (8-10): excruciating pain, doubled over, unable to do any normal activities       Moderate  7. ORAL INTAKE: If vomiting, "Have you been able to drink liquids?" "How much liquids have you had in the past 24 hours?"     Not as much as I should  8. HYDRATION: "Any signs of dehydration?" (e.g., dry mouth [not just dry lips], too weak to stand, dizziness, new weight loss) "When did you last urinate?"     Dry mouth denies  dizziness 9. EXPOSURE: "Have you traveled to a foreign country recently?" "Have you been exposed to anyone with diarrhea?" "Could you have eaten any food that was spoiled?"     No traveling, others got sick too after Thanksgiving meal  10. ANTIBIOTIC USE: "Are you taking antibiotics now or have you taken antibiotics in the past 2 months?"       No  11. OTHER SYMPTOMS: "Do you have any other symptoms?" (e.g., fever, blood in stool)       No  12. PREGNANCY: "Is there any chance you are pregnant?" "When was your last menstrual period?"       na  Protocols used: [facility]

## 2021-02-19 NOTE — Progress Notes (Signed)
Name: Sarah Gordon   MRN: [id number]    DOB: June 28, 1950   Date:02/19/2021       Progress Note  Subjective  Chief Complaint  Diarrhea/ Bloating  HPI  Diarrhea: she had Thanksgiving at a friends house and the following day she developed loose stools followed by watery stools, she vomited once. No blood in stool or mucus. Three other people at the party also got sick and another friend with similar symptoms. Episodes of diarrhea were about 8-10 times on the first day , and getting gradually better. Yesterday she had a good day, but had 4 bowel movement from last night until this morning. Denies nausea, she feels hungry but has cramps when she decides to eat. She lost 3 lbs since last visit. No fever or chills. The bloating and cramping is generalized.   Patient Active Problem List   Diagnosis Date Noted   Lumbar radiculopathy 11/06/2020   Ventricular flutter ([place]) 12/06/2018   MDD (major depressive disorder), recurrent episode, mild ([place]) 09/04/2018   DDD (degenerative disc disease), cervical 01/23/2016   Degenerative joint disease of left acromioclavicular joint 01/23/2016   Recurrent oral herpes simplex infection 09/01/2015   Irritable bowel syndrome with diarrhea 08/07/2015   Statin intolerance 02/28/2015   Asthma, intermittent 09/10/2014   Controlled type 2 diabetes with neuropathy ([place]) 09/10/2014   Fibromyalgia 09/10/2014   Obstructive sleep apnea 09/10/2014   Hyperlipidemia 12/29/2012   Essential hypertension 12/29/2012   Morbid obesity ([place]) 12/29/2012   Arthritis due to gout 12/13/2007   Acid reflux 10/24/2006   Adult hypothyroidism 10/24/2006   Localized osteoarthrosis 10/24/2006    Past Surgical History:  Procedure Laterality Date   BREAST BIOPSY Left    benign   COLONOSCOPY     Dr Alveta Heimlich   COLONOSCOPY WITH PROPOFOL N/A 11/19/2015   Procedure: COLONOSCOPY WITH PROPOFOL;  Surgeon: Christene Lye, MD;  Location: [facility] ENDOSCOPY;  Service: Endoscopy;  Laterality:  N/A;   ESOPHAGOGASTRODUODENOSCOPY N/A 08/20/2014   Procedure: ESOPHAGOGASTRODUODENOSCOPY (EGD);  Surgeon: Christene Lye, MD;  Location: [facility] ENDOSCOPY;  Service: Endoscopy;  Laterality: N/A;   FOOT SURGERY Left    heel spur   HERNIA REPAIR     umbilical   NASAL SINUS SURGERY  7412I   UMBILICAL HERNIA REPAIR      Family History  Problem Relation Age of Onset   Hypertension Mother    Hyperlipidemia Mother    Diabetes Mother    Heart disease Mother    Stroke Mother    Heart disease Brother 44       CABG x 3    Heart attack Brother 31   Lupus Sister    Arthritis Sister    Asthma Sister    Depression Sister    Diabetes Brother    Hypertension Brother    Obesity Brother    Early death Brother    58 / Stillbirths Sister    Stroke Maternal Grandfather    Breast cancer Neg Hx     Social History   Tobacco Use   Smoking status: Never   Smokeless tobacco: Never  Substance Use Topics   Alcohol use: No    Alcohol/week: 0.0 standard drinks    Comment: RARELY     Current Outpatient Medications:    albuterol (VENTOLIN HFA) 108 (90 Base) MCG/ACT inhaler, Inhale 2 puffs into the lungs every 6 (six) hours as needed for wheezing or shortness of breath., Disp: 1 each, Rfl: 2   Alirocumab (PRALUENT) 75 MG/ML  SOAJ, Inject 75 mg into the skin every 14 (fourteen) days., Disp: 6 mL, Rfl: 1   benzonatate (TESSALON) 100 MG capsule, Take 1 capsule (100 mg total) by mouth 3 (three) times daily as needed for cough., Disp: 30 capsule, Rfl: 0   DULoxetine (CYMBALTA) 60 MG capsule, Take 1 capsule (60 mg total) by mouth daily., Disp: 90 capsule, Rfl: 1   ezetimibe (ZETIA) 10 MG tablet, Take 1 tablet (10 mg total) by mouth daily., Disp: 90 tablet, Rfl: 1   fluticasone (FLOVENT HFA) 44 MCG/ACT inhaler, Inhale 1 puff into the lungs in the morning and at bedtime., Disp: 1 each, Rfl: 0   gabapentin (NEURONTIN) 300 MG capsule, Take 300 mg by mouth 3 (three) times daily., Disp: , Rfl:     hydrOXYzine (ATARAX/VISTARIL) 10 MG tablet, Take 1 tablet (10 mg total) by mouth 3 (three) times daily as needed., Disp: 30 tablet, Rfl: 0   levothyroxine (SYNTHROID) 100 MCG tablet, TAKE 1 TABLET(100 MCG) BY MOUTH DAILY, Disp: 90 tablet, Rfl: 0   metoprolol succinate (TOPROL-XL) 25 MG 24 hr tablet, TAKE 1 TABLET(25 MG) BY MOUTH DAILY, Disp: 90 tablet, Rfl: 1   montelukast (SINGULAIR) 10 MG tablet, Take 1 tablet (10 mg total) by mouth at bedtime., Disp: 90 tablet, Rfl: 1   Multiple Vitamins-Minerals (HAIR SKIN NAILS PO), Take 1 tablet by mouth daily., Disp: , Rfl:    omeprazole (PRILOSEC) 20 MG capsule, TAKE 1 CAPSULE(20 MG) BY MOUTH DAILY, Disp: 90 capsule, Rfl: 1   valsartan (DIOVAN) 160 MG tablet, Take 1 tablet (160 mg total) by mouth daily., Disp: 90 tablet, Rfl: 1  Allergies  Allergen Reactions   Vascepa [Icosapent Ethyl] Hives and Swelling   Aspir-81 [Aspirin] Other (See Comments)    Coagulation disorder   Statins Other (See Comments)   Topamax [Topiramate] Other (See Comments)    Cognitive issues    Voltaren [Diclofenac Sodium] Hives and Other (See Comments)   Lipitor [Atorvastatin]     Joint pain    I personally reviewed active problem list, medication list, allergies, family history, social history, health maintenance with the patient/caregiver today.   ROS  Ten systems reviewed and is negative except as mentioned in HPI    Objective  Vitals:   02/19/21 1135  BP: 132/80  Pulse: 89  Resp: 16  Temp: 97.8 F (36.6 C)  SpO2: 97%  Weight: 269 lb (122 kg)  Height: 5\' 6"  (1.676 m)    Body mass index is 43.42 kg/m.  Physical Exam  Constitutional: Patient appears well-developed and well-nourished. Obese  No distress.  HEENT: head atraumatic, normocephalic, pupils equal and reactive to light, neck supple Cardiovascular: Normal rate, regular rhythm and normal heart sounds.  No murmur heard. No BLE edema. Pulmonary/Chest: Effort normal and breath sounds normal. No  respiratory distress. Abdominal: Soft.  There is left upper quadrant and epigastric tenderness, normal bowel sounds, no guarding or rebound tenderness  Psychiatric: Patient has a normal mood and affect. behavior is normal. Judgment and thought content normal.   Recent Results (from the past 2160 hour(s))  POCT HgB A1C     Status: Abnormal   Collection Time: 11/25/20  9:14 AM  Result Value Ref Range   Hemoglobin A1C 6.0 (A) 4.0 - 5.6 %   HbA1c POC (<> result, manual entry)     HbA1c, POC (prediabetic range)     HbA1c, POC (controlled diabetic range)    SARS CORONAVIRUS 2 (TAT 6-24 HRS) Nasopharyngeal Nasopharyngeal Swab  Status: None   Collection Time: 12/02/20  8:51 AM   Specimen: Nasopharyngeal Swab  Result Value Ref Range   SARS Coronavirus 2 NEGATIVE NEGATIVE    Comment: (NOTE) SARS-CoV-2 target nucleic acids are NOT DETECTED.  The SARS-CoV-2 RNA is generally detectable in upper and lower respiratory specimens during the acute phase of infection. Negative results do not preclude SARS-CoV-2 infection, do not rule out co-infections with other pathogens, and should not be used as the sole basis for treatment or other patient management decisions. Negative results must be combined with clinical observations, patient history, and epidemiological information. The expected result is Negative.  Fact Sheet for Patients: SugarRoll.be  Fact Sheet for Healthcare Providers: https://www.woods-mathews.com/  This test is not yet approved or cleared by the [place] FDA and  has been authorized for detection and/or diagnosis of SARS-CoV-2 by FDA under an Emergency Use Authorization (EUA). This EUA will remain  in effect (meaning this test can be used) for the duration of the COVID-19 declaration under Se ction 564(b)(1) of the Act, 21 U.S.C. section 360bbb-3(b)(1), unless the authorization is terminated or revoked sooner.  Performed at [facility], [place] [address], [place], [place] [address]      PHQ2/9: Depression screen [facility] 2/9 02/19/2021 11/25/2020 10/10/2020 06/03/2020 05/27/2020  Decreased Interest 0 1 0 0 0  Down, Depressed, Hopeless 0 1 1 0 0  PHQ - 2 Score 0 2 1 0 0  Altered sleeping 0 1 1 1  0  Tired, decreased energy 0 2 3 1 1   Change in appetite 0 0 0 0 1  Feeling bad or failure about yourself  0 0 0 0 0  Trouble concentrating 0 0 0 0 0  Moving slowly or fidgety/restless 0 0 0 0 0  Suicidal thoughts 0 0 0 0 0  PHQ-9 Score 0 5 5 2 2   Difficult doing work/chores - - - - -  Some recent data might be hidden    phq 9 is negative   Fall Risk: Fall Risk  02/19/2021 11/25/2020 10/10/2020 06/26/2020 06/03/2020  Falls in the past year? 1 1 1  0 0  Number falls in past yr: 1 1 0 0 0  Injury with Fall? 1 1 1  - 0  Risk for fall due to : No Fall Risks Impaired balance/gait - - -  Follow up Falls prevention discussed Falls prevention discussed - Falls prevention discussed -      Functional Status Survey: Is the patient deaf or have difficulty hearing?: No Does the patient have difficulty seeing, even when wearing glasses/contacts?: No Does the patient have difficulty concentrating, remembering, or making decisions?: Yes Does the patient have difficulty walking or climbing stairs?: Yes Does the patient have difficulty dressing or bathing?: No Does the patient have difficulty doing errands alone such as visiting a doctor's office or shopping?: No    Assessment & Plan  1. Gastroenteritis, acute  - Gastrointestinal Pathogen Panel PCR - dicyclomine (BENTYL) 10 MG capsule; Take 1 capsule (10 mg total) by mouth 4 (four) times daily -  before meals and at bedtime.  Dispense: 30 capsule; Refill: 0 - famotidine (PEPCID) 40 MG tablet; Take 1 tablet (40 mg total) by mouth at bedtime.  Dispense: 30 tablet; Refill: 0    Discussed importance of hydration, can continue imodium prn , let us know if no resolution by next week

## 2021-02-23 ENCOUNTER — Ambulatory Visit: Payer: Medicare HMO | Admitting: Family Medicine

## 2021-03-27 ENCOUNTER — Other Ambulatory Visit: Payer: Self-pay

## 2021-03-27 ENCOUNTER — Other Ambulatory Visit
Admission: RE | Admit: 2021-03-27 | Discharge: 2021-03-27 | Disposition: A | Discharge status: Home / Self Care | Payer: Medicare HMO | Source: Ambulatory Visit | Attending: Family Medicine | Admitting: Family Medicine

## 2021-03-27 DIAGNOSIS — Z20822 Contact with and (suspected) exposure to covid-19: Secondary | ICD-10-CM | POA: Diagnosis not present

## 2021-03-27 DIAGNOSIS — Z01812 Encounter for preprocedural laboratory examination: Secondary | ICD-10-CM | POA: Insufficient documentation

## 2021-03-28 ENCOUNTER — Telehealth: Payer: Medicare HMO | Admitting: Nurse Practitioner

## 2021-03-28 DIAGNOSIS — U071 COVID-19: Secondary | ICD-10-CM | POA: Diagnosis not present

## 2021-03-28 LAB — SARS CORONAVIRUS 2 (TAT 6-24 HRS): SARS Coronavirus 2: POSITIVE — AB

## 2021-03-28 NOTE — Progress Notes (Signed)
Virtual Visit Consent   [name], you are scheduled for a virtual visit with a [facility] provider today.     Just as with appointments in the office, your consent must be obtained to participate.  Your consent will be active for this visit and any virtual visit you may have with one of our providers in the next 365 days.     If you have a MyChart account, a copy of this consent can be sent to you electronically.  All virtual visits are billed to your insurance company just like a traditional visit in the office.    As this is a virtual visit, video technology does not allow for your provider to perform a traditional examination.  This may limit your provider's ability to fully assess your condition.  If your provider identifies any concerns that need to be evaluated in person or the need to arrange testing (such as labs, EKG, etc.), we will make arrangements to do so.     Although advances in technology are sophisticated, we cannot ensure that it will always work on either your end or our end.  If the connection with a video visit is poor, the visit may have to be switched to a telephone visit.  With either a video or telephone visit, we are not always able to ensure that we have a secure connection.     I need to obtain your verbal consent now.   Are you willing to proceed with your visit today?    [name] has provided verbal consent on 03/28/2021 for a virtual visit (video or telephone).   Apolonio Schneiders, FNP   Date: 03/28/2021 2:55 PM   Virtual Visit via Video Note   I, Apolonio Schneiders, connected with  Sarah Gordon  ([id number], [birth year]) on 03/28/21 at  3:00 PM EST by a video-enabled telemedicine application and verified that I am speaking with the correct person using two identifiers.  Location: Patient: Virtual Visit Location Patient: Home Provider: Virtual Visit Location Provider: Home Office   I discussed the limitations of evaluation and management by telemedicine and the  availability of in person appointments. The patient expressed understanding and agreed to proceed.    History of Present Illness: Sarah Gordon is a 71 y.o. who identifies as a female who was assigned female at birth, and is being seen today after testing positive for COVID yesterday.   She was scheduled for a sleep study next week and needed a COVID test prior to that, as required.  She does suffer from IBS and has been having stomach cramping and diarrhea that she attributed to a flare.   She has also had nasal congestion on and off for the past 6 weeks, she has a cough as well but this is all normal for her in the winter. Her URI symptoms have improved this week  She would not have taken a COVID test normally, but was required to do so as part of her sleep study.   She has had COVID in the past 8-9 months ago. She denies any complications with that illness. She did not have any treatment at that time.   She has been vaccinated for COVID x4 most recent was in September.   She does have a history HTN, asthma and Sleep Apnea   She did have an asthma exacerbation about 3-4 weeks ago that required her to use her steroidal inhaler and her albuterol inhaler due to a cough that  was present at that time. Denies a fever at that time.   She was treated for gastritis around that time as well but has continue to have some diarrhea and bloating related to her IBS.    Problems:  Patient Active Problem List   Diagnosis Date Noted   Lumbar radiculopathy 11/06/2020   Ventricular flutter ([place]) 12/06/2018   MDD (major depressive disorder), recurrent episode, mild ([place]) 09/04/2018   DDD (degenerative disc disease), cervical 01/23/2016   Degenerative joint disease of left acromioclavicular joint 01/23/2016   Recurrent oral herpes simplex infection 09/01/2015   Irritable bowel syndrome with diarrhea 08/07/2015   Statin intolerance 02/28/2015   Asthma, intermittent 09/10/2014   Controlled type 2  diabetes with neuropathy ([place]) 09/10/2014   Fibromyalgia 09/10/2014   Obstructive sleep apnea 09/10/2014   Hyperlipidemia 12/29/2012   Essential hypertension 12/29/2012   Morbid obesity ([place]) 12/29/2012   Arthritis due to gout 12/13/2007   Acid reflux 10/24/2006   Adult hypothyroidism 10/24/2006   Localized osteoarthrosis 10/24/2006    Allergies:  Allergies  Allergen Reactions   Vascepa [Icosapent Ethyl] Hives and Swelling   Aspir-81 [Aspirin] Other (See Comments)    Coagulation disorder   Statins Other (See Comments)   Topamax [Topiramate] Other (See Comments)    Cognitive issues    Voltaren [Diclofenac Sodium] Hives and Other (See Comments)   Lipitor [Atorvastatin]     Joint pain   Medications:  Current Outpatient Medications:    albuterol (VENTOLIN HFA) 108 (90 Base) MCG/ACT inhaler, Inhale 2 puffs into the lungs every 6 (six) hours as needed for wheezing or shortness of breath., Disp: 1 each, Rfl: 2   Alirocumab (PRALUENT) 75 MG/ML SOAJ, Inject 75 mg into the skin every 14 (fourteen) days., Disp: 6 mL, Rfl: 1   benzonatate (TESSALON) 100 MG capsule, Take 1 capsule (100 mg total) by mouth 3 (three) times daily as needed for cough., Disp: 30 capsule, Rfl: 0   dicyclomine (BENTYL) 10 MG capsule, Take 1 capsule (10 mg total) by mouth 4 (four) times daily -  before meals and at bedtime., Disp: 30 capsule, Rfl: 0   DULoxetine (CYMBALTA) 60 MG capsule, Take 1 capsule (60 mg total) by mouth daily., Disp: 90 capsule, Rfl: 1   ezetimibe (ZETIA) 10 MG tablet, Take 1 tablet (10 mg total) by mouth daily., Disp: 90 tablet, Rfl: 1   famotidine (PEPCID) 40 MG tablet, Take 1 tablet (40 mg total) by mouth at bedtime., Disp: 30 tablet, Rfl: 0   fluticasone (FLOVENT HFA) 44 MCG/ACT inhaler, Inhale 1 puff into the lungs in the morning and at bedtime., Disp: 1 each, Rfl: 0   gabapentin (NEURONTIN) 300 MG capsule, Take 300 mg by mouth 3 (three) times daily., Disp: , Rfl:    hydrOXYzine  (ATARAX/VISTARIL) 10 MG tablet, Take 1 tablet (10 mg total) by mouth 3 (three) times daily as needed., Disp: 30 tablet, Rfl: 0   levothyroxine (SYNTHROID) 100 MCG tablet, TAKE 1 TABLET(100 MCG) BY MOUTH DAILY, Disp: 90 tablet, Rfl: 0   metoprolol succinate (TOPROL-XL) 25 MG 24 hr tablet, TAKE 1 TABLET(25 MG) BY MOUTH DAILY, Disp: 90 tablet, Rfl: 1   montelukast (SINGULAIR) 10 MG tablet, Take 1 tablet (10 mg total) by mouth at bedtime., Disp: 90 tablet, Rfl: 1   Multiple Vitamins-Minerals (HAIR SKIN NAILS PO), Take 1 tablet by mouth daily., Disp: , Rfl:    omeprazole (PRILOSEC) 20 MG capsule, TAKE 1 CAPSULE(20 MG) BY MOUTH DAILY, Disp: 90 capsule, Rfl: 1  valsartan (DIOVAN) 160 MG tablet, Take 1 tablet (160 mg total) by mouth daily., Disp: 90 tablet, Rfl: 1  Observations/Objective: Patient is well-developed, well-nourished in no acute distress.  Resting comfortably at home.  Head is normocephalic, atraumatic.  No labored breathing.  Speech is clear and coherent with logical content.  Patient is alert and oriented at baseline.    Assessment and Plan: 1. COVID-19 Unsure when day 1 of symptoms was, patient is overall improving and this was likely incidental finding of old illness in the past month.   She will continue to monitor for acute symptoms  Advised against anti-viral at this time due to current IBS symptoms and SE of anti-viral and GI tract.   She will continue to manage with over the counter medications as she did with prior round of COVID Will use Albuterol as needed   Will seek follow up for new/acute or worsening symptoms as discussed      Follow Up Instructions: I discussed the assessment and treatment plan with the patient. The patient was provided an opportunity to ask questions and all were answered. The patient agreed with the plan and demonstrated an understanding of the instructions.  A copy of instructions were sent to the patient via MyChart unless otherwise noted  below.     The patient was advised to call back or seek an in-person evaluation if the symptoms worsen or if the condition fails to improve as anticipated.  Time:  I spent 10 minutes with the patient via telehealth technology discussing the above problems/concerns.    Apolonio Schneiders, FNP

## 2021-03-30 DIAGNOSIS — Z20822 Contact with and (suspected) exposure to covid-19: Secondary | ICD-10-CM | POA: Diagnosis not present

## 2021-03-30 NOTE — Progress Notes (Signed)
Name: Sarah Gordon   MRN: [id number]    DOB: May 07, 1950   Date:03/31/2021       Progress Note  Subjective  Chief Complaint  Follow Up  I connected with  [name]  on 03/31/21 at  8:20 AM EST by a video enabled telemedicine application and verified that I am speaking with the correct person using two identifiers.  I discussed the limitations of evaluation and management by telemedicine and the availability of in person appointments. The patient expressed understanding and agreed to proceed with the virtual visit  Staff also discussed with the patient that there may be a patient responsible charge related to this service. Patient Location: at home  Provider Location: [facility] Additional Individuals present:   HPI  HTN: bp  monitor at home is broken,   she is compliant with medication, denies chest pain or palpitation. Since we added metoprolol heart rate has been at goal she is also on valsartan . History of ventricular flutter in 2020 but resolved since - seen by cardiologist   Bloating: she developed diarrhea early Dec, she was seen we order PCR study but diarrhea resolved and it was done, she continues to have abdominal bloating and distention also episodes of indigestion, no nausea. She is usually constipated but a few times a month she has episodes of watery stools associated with cramping that lasts for about 24-36 hours and resolves by itself   Ventricular flutter: seen by cardiologist, medication for thyroid adjusted and feeling well since. She states no recent episodes of palpitation .  MDD: she is feeling better on duloxetine and denies side effects.  Last visit she was noticing that she was feeling  numb, we dropped dose to 30 mg but lower dose caused back pain to increase, she has been on 60 mg for a while now and feeling well   DMII: doing well on life style modification. A1C at goal. Denies polyphagia, polydipsia or polyuria She has a history of neuropathy, very mild symptoms,  A1C is stable but trending up at 6 %  she is on Zetia for dyslipidemia, unable to tolerate statin therapy, and recently started on Praluent and denies side effects .    DDD lumbar spine: she had MRI done back in 2010 and showed herniated disc, she had steroid injections twice and it helped. However pain is back , she has been using a walker when she is planning on walking for a while . She staes the pain is worse on the right lower back, but can radiate both legs. Also having radiculitis on right side of neck , down to her arm she states symptoms resolved.  She has seen Dr. Kayleen Memos at [facility] She has been taking naproxen otc two pills twice daily, discussed risk of kidney problems and to try Tylenol instead and gabapentin at night   OA: knees and feet, also has FMS, she states her legs are doing better, except for when radiates down her legs. Stable    Asthma: She has occasional dry cough, SOB and wheezing a couple of times a month , she uses albuterol prn for that . She had a positive COVID-19 test but asymptomatic. She uses Flovent prn only    Morbid Obesity: BMI above 40, she states weight about the same. She tries to be mindful about her diet    Hypothyroidism: she is taking medication daily, denies hair loss, she has chronic dry skin and chronic constipation Last TSH is at goal  and we will repeat level   GERD: doing well on Omeprazole, explained PPI can cause diarrhea, and she may want to try switching to Pepcid instead    Patient Active Problem List   Diagnosis Date Noted   Lumbar radiculopathy 11/06/2020   Ventricular flutter ([place]) 12/06/2018   MDD (major depressive disorder), recurrent episode, mild ([place]) 09/04/2018   DDD (degenerative disc disease), cervical 01/23/2016   Degenerative joint disease of left acromioclavicular joint 01/23/2016   Recurrent oral herpes simplex infection 09/01/2015   Irritable bowel syndrome with diarrhea 08/07/2015   Statin intolerance 02/28/2015    Asthma, intermittent 09/10/2014   Controlled type 2 diabetes with neuropathy ([place]) 09/10/2014   Fibromyalgia 09/10/2014   Obstructive sleep apnea 09/10/2014   Hyperlipidemia 12/29/2012   Essential hypertension 12/29/2012   Morbid obesity ([place]) 12/29/2012   Arthritis due to gout 12/13/2007   Acid reflux 10/24/2006   Adult hypothyroidism 10/24/2006   Localized osteoarthrosis 10/24/2006    Past Surgical History:  Procedure Laterality Date   BREAST BIOPSY Left    benign   COLONOSCOPY     Dr Alveta Heimlich   COLONOSCOPY WITH PROPOFOL N/A 11/19/2015   Procedure: COLONOSCOPY WITH PROPOFOL;  Surgeon: Christene Lye, MD;  Location: [facility] ENDOSCOPY;  Service: Endoscopy;  Laterality: N/A;   ESOPHAGOGASTRODUODENOSCOPY N/A 08/20/2014   Procedure: ESOPHAGOGASTRODUODENOSCOPY (EGD);  Surgeon: Christene Lye, MD;  Location: [facility] ENDOSCOPY;  Service: Endoscopy;  Laterality: N/A;   FOOT SURGERY Left    heel spur   HERNIA REPAIR     umbilical   NASAL SINUS SURGERY  1610R   UMBILICAL HERNIA REPAIR      Family History  Problem Relation Age of Onset   Hypertension Mother    Hyperlipidemia Mother    Diabetes Mother    Heart disease Mother    Stroke Mother    Heart disease Brother 66       CABG x 3    Heart attack Brother 69   Lupus Sister    Arthritis Sister    Asthma Sister    Depression Sister    Diabetes Brother    Hypertension Brother    Obesity Brother    Early death Brother    52 / Stillbirths Sister    Stroke Maternal Grandfather    Breast cancer Neg Hx     Social History   Socioeconomic History   Marital status: Married    Spouse name: Georgena Spurling    Number of children: 1   Years of education: Not on file   Highest education level: Some college, no degree  Occupational History    Comment: works part time at [employer]  Tobacco Use   Smoking status: Never   Smokeless tobacco: Never  Vaping Use   Vaping Use: Never used  Substance and Sexual Activity   Alcohol  use: No    Alcohol/week: 0.0 standard drinks    Comment: RARELY   Drug use: No   Sexual activity: Never  Other Topics Concern   Not on file  Social History Narrative   Not on file   Social Determinants of Health   Financial Resource Strain: Low Risk    Difficulty of Paying Living Expenses: Not hard at all  Food Insecurity: No Food Insecurity   Worried About Charity fundraiser in the Last Year: Never true   [place] in the Last Year: Never true  Transportation Needs: No Transportation Needs   Lack of Transportation (Medical): No  Lack of Transportation (Non-Medical): No  Physical Activity: Insufficiently Active   Days of Exercise per Week: 1 day   Minutes of Exercise per Session: 30 min  Stress: No Stress Concern Present   Feeling of Stress : Only a little  Social Connections: Engineer, building services of Communication with Friends and Family: Once a week   Frequency of Social Gatherings with Friends and Family: Three times a week   Attends Religious Services: More than 4 times per year   Active Member of Clubs or Organizations: Yes   Attends Archivist Meetings: 1 to 4 times per year   Marital Status: Married  Human resources officer Violence: Not At Risk   Fear of Current or Ex-Partner: No   Emotionally Abused: No   Physically Abused: No   Sexually Abused: No     Current Outpatient Medications:    albuterol (VENTOLIN HFA) 108 (90 Base) MCG/ACT inhaler, Inhale 2 puffs into the lungs every 6 (six) hours as needed for wheezing or shortness of breath., Disp: 1 each, Rfl: 2   Alirocumab (PRALUENT) 75 MG/ML SOAJ, Inject 75 mg into the skin every 14 (fourteen) days., Disp: 6 mL, Rfl: 1   dicyclomine (BENTYL) 10 MG capsule, Take 1 capsule (10 mg total) by mouth 4 (four) times daily -  before meals and at bedtime., Disp: 30 capsule, Rfl: 0   famotidine (PEPCID) 40 MG tablet, Take 1 tablet (40 mg total) by mouth at bedtime., Disp: 30 tablet, Rfl: 0   fluticasone  (FLOVENT HFA) 44 MCG/ACT inhaler, Inhale 1 puff into the lungs in the morning and at bedtime., Disp: 1 each, Rfl: 0   gabapentin (NEURONTIN) 300 MG capsule, Take 300 mg by mouth 3 (three) times daily., Disp: , Rfl:    hydrOXYzine (ATARAX/VISTARIL) 10 MG tablet, Take 1 tablet (10 mg total) by mouth 3 (three) times daily as needed., Disp: 30 tablet, Rfl: 0   levothyroxine (SYNTHROID) 100 MCG tablet, TAKE 1 TABLET(100 MCG) BY MOUTH DAILY, Disp: 90 tablet, Rfl: 0   Multiple Vitamins-Minerals (HAIR SKIN NAILS PO), Take 1 tablet by mouth daily., Disp: , Rfl:    omeprazole (PRILOSEC) 20 MG capsule, TAKE 1 CAPSULE(20 MG) BY MOUTH DAILY, Disp: 90 capsule, Rfl: 1   DULoxetine (CYMBALTA) 60 MG capsule, Take 1 capsule (60 mg total) by mouth daily., Disp: 90 capsule, Rfl: 1   ezetimibe (ZETIA) 10 MG tablet, Take 1 tablet (10 mg total) by mouth daily., Disp: 90 tablet, Rfl: 1   metoprolol succinate (TOPROL-XL) 25 MG 24 hr tablet, TAKE 1 TABLET(25 MG) BY MOUTH DAILY, Disp: 90 tablet, Rfl: 1   montelukast (SINGULAIR) 10 MG tablet, Take 1 tablet (10 mg total) by mouth at bedtime., Disp: 90 tablet, Rfl: 1   valsartan (DIOVAN) 160 MG tablet, Take 1 tablet (160 mg total) by mouth daily., Disp: 90 tablet, Rfl: 1  Allergies  Allergen Reactions   Vascepa [Icosapent Ethyl] Hives and Swelling   Aspir-81 [Aspirin] Other (See Comments)    Coagulation disorder   Statins Other (See Comments)   Topamax [Topiramate] Other (See Comments)    Cognitive issues    Voltaren [Diclofenac Sodium] Hives and Other (See Comments)   Lipitor [Atorvastatin]     Joint pain    I personally reviewed active problem list, medication list, allergies, family history, social history, health maintenance with the patient/caregiver today.   ROS  Ten systems reviewed and is negative except as mentioned in HPI   Objective  Virtual encounter,  vitals not obtained.  There is no height or weight on file to calculate BMI.  Physical  Exam  Awake, alert and oriented   PHQ2/9: Depression screen [facility] 2/9 03/31/2021 02/19/2021 11/25/2020 10/10/2020 06/03/2020  Decreased Interest 0 0 1 0 0  Down, Depressed, Hopeless 0 0 1 1 0  PHQ - 2 Score 0 0 2 1 0  Altered sleeping 0 0 1 1 1   Tired, decreased energy 0 0 2 3 1   Change in appetite 0 0 0 0 0  Feeling bad or failure about yourself  0 0 0 0 0  Trouble concentrating 0 0 0 0 0  Moving slowly or fidgety/restless 0 0 0 0 0  Suicidal thoughts 0 0 0 0 0  PHQ-9 Score 0 0 5 5 2   Difficult doing work/chores - - - - -  Some recent data might be hidden   PHQ-2/9 Result is negative.    Fall Risk: Fall Risk  03/31/2021 02/19/2021 11/25/2020 10/10/2020 06/26/2020  Falls in the past year? 1 1 1 1  0  Number falls in past yr: 1 1 1  0 0  Injury with Fall? 1 1 1 1  -  Risk for fall due to : No Fall Risks No Fall Risks Impaired balance/gait - -  Follow up Falls prevention discussed Falls prevention discussed Falls prevention discussed - Falls prevention discussed     Assessment & Plan  1. Dyslipidemia associated with type 2 diabetes mellitus (HCC)  - Lipid panel - Hemoglobin A1c - Microalbumin / creatinine urine ratio  2. Recent change in frequency of bowel movements  - Ambulatory referral to Gastroenterology  3. Ventricular flutter ([place])   4. Bloating  - Ambulatory referral to Gastroenterology  5. Morbid obesity ([place])  Discussed with the patient the risk posed by an increased BMI. Discussed importance of portion control, calorie counting and at least 150 minutes of physical activity weekly. Avoid sweet beverages and drink more water. Eat at least 6 servings of fruit and vegetables daily    6. OSA (obstructive sleep apnea)  She will go for repeat study tonight  7. Adult hypothyroidism  - TSH  8. MDD (recurrent major depressive disorder) in remission ([place])   9. Essential hypertension  - CBC with Differential/Platelet - COMPLETE METABOLIC PANEL WITH GFR  10. Mild  intermittent asthma without complication     I discussed the assessment and treatment plan with the patient. The patient was provided an opportunity to ask questions and all were answered. The patient agreed with the plan and demonstrated an understanding of the instructions.  The patient was advised to call back or seek an in-person evaluation if the symptoms worsen or if the condition fails to improve as anticipated.  I provided 25 minutes of non-face-to-face time during this encounter.

## 2021-03-31 ENCOUNTER — Encounter: Payer: Self-pay | Admitting: Family Medicine

## 2021-03-31 ENCOUNTER — Telehealth (INDEPENDENT_AMBULATORY_CARE_PROVIDER_SITE_OTHER): Payer: Medicare HMO | Admitting: Family Medicine

## 2021-03-31 ENCOUNTER — Ambulatory Visit: Payer: Medicare HMO | Attending: Neurology

## 2021-03-31 DIAGNOSIS — I10 Essential (primary) hypertension: Secondary | ICD-10-CM

## 2021-03-31 DIAGNOSIS — G4733 Obstructive sleep apnea (adult) (pediatric): Secondary | ICD-10-CM | POA: Insufficient documentation

## 2021-03-31 DIAGNOSIS — R194 Change in bowel habit: Secondary | ICD-10-CM | POA: Diagnosis not present

## 2021-03-31 DIAGNOSIS — E1169 Type 2 diabetes mellitus with other specified complication: Secondary | ICD-10-CM

## 2021-03-31 DIAGNOSIS — I4902 Ventricular flutter: Secondary | ICD-10-CM

## 2021-03-31 DIAGNOSIS — E039 Hypothyroidism, unspecified: Secondary | ICD-10-CM

## 2021-03-31 DIAGNOSIS — R69 Illness, unspecified: Secondary | ICD-10-CM | POA: Diagnosis not present

## 2021-03-31 DIAGNOSIS — E785 Hyperlipidemia, unspecified: Secondary | ICD-10-CM

## 2021-03-31 DIAGNOSIS — R140 Abdominal distension (gaseous): Secondary | ICD-10-CM | POA: Diagnosis not present

## 2021-03-31 DIAGNOSIS — J4520 Mild intermittent asthma, uncomplicated: Secondary | ICD-10-CM

## 2021-03-31 DIAGNOSIS — F3340 Major depressive disorder, recurrent, in remission, unspecified: Secondary | ICD-10-CM

## 2021-03-31 MED ORDER — DULOXETINE HCL 60 MG PO CPEP: 60.0000 mg | ORAL_CAPSULE | Freq: Every day | ORAL | 1 refills | Status: DC

## 2021-03-31 MED ORDER — EZETIMIBE 10 MG PO TABS: 10.0000 mg | ORAL_TABLET | Freq: Every day | ORAL | 1 refills | Status: DC

## 2021-03-31 MED ORDER — METOPROLOL SUCCINATE ER 25 MG PO TB24: ORAL_TABLET | ORAL | 1 refills | Status: DC

## 2021-03-31 MED ORDER — VALSARTAN 160 MG PO TABS: 160.0000 mg | ORAL_TABLET | Freq: Every day | ORAL | 1 refills | Status: DC

## 2021-03-31 MED ORDER — MONTELUKAST SODIUM 10 MG PO TABS: 10.0000 mg | ORAL_TABLET | Freq: Every day | ORAL | 1 refills | Status: DC

## 2021-04-02 ENCOUNTER — Other Ambulatory Visit: Payer: Self-pay

## 2021-04-02 ENCOUNTER — Telehealth: Payer: Self-pay

## 2021-04-02 NOTE — Telephone Encounter (Signed)
Called no answer left voicemail and sent my chart message

## 2021-04-13 ENCOUNTER — Encounter: Payer: Self-pay | Admitting: Family Medicine

## 2021-04-20 ENCOUNTER — Telehealth: Payer: Self-pay

## 2021-04-20 NOTE — Telephone Encounter (Signed)
Dr. Ancil Boozer recommended that she stop taking omeprazole. She is constantly having abdominal pain in her rib cage, bloating, off and on diarrhea. She has not had diarrhea in 10 days. She is having constant abdominal pain that is epigastric area. She states it is worse when she eats. She is having burning in throat and chest. She states it feels better if she is laying down. She is taking Pepcid 40mg  daily. Patient has appointment with you on 05/26/21

## 2021-04-21 MED ORDER — OMEPRAZOLE 40 MG PO CPDR: 40.0000 mg | DELAYED_RELEASE_CAPSULE | Freq: Two times a day (BID) | ORAL | 2 refills | Status: DC

## 2021-04-21 NOTE — Telephone Encounter (Signed)
If omeprazole is helping, she can restart omeprazole 40 mg p.o. twice daily until I see her.  Dr. Ancil Boozer ordered GI panel to rule out infection, she has to get that done.  Looks like she also ordered CT abdomen and pelvis which I agree with that, please tell her that I recommend the same.  Thanks RV

## 2021-04-21 NOTE — Telephone Encounter (Signed)
Patient verbalized understanding. She states she will get CT scan and also get stool test done

## 2021-04-21 NOTE — Addendum Note (Signed)
Addended by: Ulyess Blossom L on: 04/21/2021 09:35 AM   Modules accepted: Orders

## 2021-05-12 ENCOUNTER — Other Ambulatory Visit: Payer: Self-pay | Admitting: Family Medicine

## 2021-05-12 DIAGNOSIS — M5136 Other intervertebral disc degeneration, lumbar region: Secondary | ICD-10-CM

## 2021-05-26 ENCOUNTER — Other Ambulatory Visit: Payer: Self-pay

## 2021-05-26 ENCOUNTER — Ambulatory Visit (INDEPENDENT_AMBULATORY_CARE_PROVIDER_SITE_OTHER): Payer: Medicare HMO | Admitting: Gastroenterology

## 2021-05-26 ENCOUNTER — Encounter: Payer: Self-pay | Admitting: Gastroenterology

## 2021-05-26 VITALS — BP 141/84 | HR 83 | Temp 98.7°F | Ht 66.0 in | Wt 265.4 lb

## 2021-05-26 DIAGNOSIS — R140 Abdominal distension (gaseous): Secondary | ICD-10-CM | POA: Diagnosis not present

## 2021-05-26 DIAGNOSIS — R1013 Epigastric pain: Secondary | ICD-10-CM

## 2021-05-26 DIAGNOSIS — R1030 Lower abdominal pain, unspecified: Secondary | ICD-10-CM | POA: Diagnosis not present

## 2021-05-26 MED ORDER — NA SULFATE-K SULFATE-MG SULF 17.5-3.13-1.6 GM/177ML PO SOLN: 354.0000 mL | Freq: Once | ORAL | 0 refills | Status: AC

## 2021-05-26 NOTE — Patient Instructions (Signed)
Call [phone] option 3 and then option 2 to schedule the CT scan.  ?

## 2021-05-26 NOTE — Progress Notes (Signed)
Cephas Darby, MD [address]  [place]  [place], [place] [address]  Main: [phone]  Fax: [phone]    Gastroenterology Consultation  Referring Provider:     Steele Sizer, MD Primary Care Physician:  Steele Sizer, MD Primary Gastroenterologist:  Dr. Cephas Darby Reason for Consultation: IBS, dyspepsia        HPI:   Sarah Gordon is a 71 y.o. female referred by Dr. Steele Sizer, MD  for consultation & management of IBS and dyspepsia.  2 months ago, patient went to [place] 2 weeks after she returned from [place], started experiencing abdominal cramps, urgency associated with nausea, bloating and diarrhea.  She has history of IBS, typically her flareups are mild and self-limited.  She was seen by Dr. Ancil Boozer as patient felt the symptoms are different from her IBS symptoms, therefore ordered H. pylori breath test given her history of H. pylori infection in the past.  This is negative.  However, her symptoms of upper abdominal pain, nausea and bloating persisted.  She underwent right upper quadrant ultrasound which revealed fatty liver, no evidence of cholelithiasis. Patient is referred to GI due to persistent symptoms, she is currently taking omeprazole 20 mg daily.  Her most recent labs were unremarkable.  She denies any weight loss  Follow-up visit 05/26/2021 Patient has not seen me since 12/2018.  She is here to discuss about ongoing symptoms of abdominal discomfort, epigastric pain, lower abdominal pain, variable stool consistency, lately loose frequent bowel movements associated with significant abdominal bloating.  Patient's PCP ordered CT abdomen pelvis with contrast, has not been scheduled yet.  She was supposed to provide a stool specimen to rule out infection and other labs which she has not done yet.  Patient denies carbonated beverages, consumption of artificial sweeteners.  She does not smoke or drink alcohol  NSAIDs:  None  Antiplts/Anticoagulants/Anti thrombotics: None  GI Procedures:  Colonoscopy 11/19/2015 - The entire examined colon is normal on direct and retroflexion views. - No specimens collected.  Upper endoscopy 08/20/2014 for dysphagia - Normal examined duodenum. - Normal stomach. - Nodular mucosa in the esophagus. Biopsied. - The examination was otherwise normal.  DIAGNOSIS:  A. ESOPHAGUS, LOWER, NODULAR AREA; COLD BIOPSY:  - STRATIFIED SQUAMOUS EPITHELIUM WITH AREAS OF GLYCOGEN ACANTHOSIS.  - NO ACTIVE INFLAMMATION, DYSPLASIA, OR MALIGNANCY.  Past Medical History:  Diagnosis Date   Allergy    Asthma    Cataract    Esophageal reflux    Gouty arthropathy, unspecified    Herpes simplex without mention of complication    Irritable bowel syndrome    Localized osteoarthrosis not specified whether primary or secondary, unspecified site    Migraine, unspecified, without mention of intractable migraine without mention of status migrainosus    Mixed hyperlipidemia    Myalgia and myositis, unspecified    Obstructive sleep apnea (adult) (pediatric)    Palpitations    Type II or unspecified type diabetes mellitus without mention of complication, not stated as uncontrolled    Umbilical hernia without mention of obstruction or gangrene    Unspecified essential hypertension    Unspecified hypothyroidism     Past Surgical History:  Procedure Laterality Date   BREAST BIOPSY Left    benign   COLONOSCOPY     Dr Alveta Heimlich   COLONOSCOPY WITH PROPOFOL N/A 11/19/2015   Procedure: COLONOSCOPY WITH PROPOFOL;  Surgeon: Christene Lye, MD;  Location: [facility] ENDOSCOPY;  Service: Endoscopy;  Laterality: N/A;   ESOPHAGOGASTRODUODENOSCOPY N/A  08/20/2014   Procedure: ESOPHAGOGASTRODUODENOSCOPY (EGD);  Surgeon: Christene Lye, MD;  Location[facility] ENDOSCOPY;  Service: Endoscopy;  Laterality: N/A;   FOOT SURGERY Left    heel spur   HERNIA REPAIR     umbilical   NASAL SINUS SURGERY  1610R    UMBILICAL HERNIA REPAIR      Current Outpatient Medications:    albuterol (VENTOLIN HFA) 108 (90 Base) MCG/ACT inhaler, Inhale 2 puffs into the lungs every 6 (six) hours as needed for wheezing or shortness of breath., Disp: 1 each, Rfl: 2   Alirocumab (PRALUENT) 75 MG/ML SOAJ, Inject 75 mg into the skin every 14 (fourteen) days., Disp: 6 mL, Rfl: 1   DULoxetine (CYMBALTA) 60 MG capsule, Take 1 capsule (60 mg total) by mouth daily., Disp: 90 capsule, Rfl: 1   ezetimibe (ZETIA) 10 MG tablet, Take 1 tablet (10 mg total) by mouth daily., Disp: 90 tablet, Rfl: 1   fluticasone (FLOVENT HFA) 44 MCG/ACT inhaler, Inhale 1 puff into the lungs in the morning and at bedtime., Disp: 1 each, Rfl: 0   hydrOXYzine (ATARAX/VISTARIL) 10 MG tablet, Take 1 tablet (10 mg total) by mouth 3 (three) times daily as needed., Disp: 30 tablet, Rfl: 0   levothyroxine (SYNTHROID) 100 MCG tablet, TAKE 1 TABLET(100 MCG) BY MOUTH DAILY, Disp: 90 tablet, Rfl: 0   metoprolol succinate (TOPROL-XL) 25 MG 24 hr tablet, TAKE 1 TABLET(25 MG) BY MOUTH DAILY, Disp: 90 tablet, Rfl: 1   montelukast (SINGULAIR) 10 MG tablet, Take 1 tablet (10 mg total) by mouth at bedtime., Disp: 90 tablet, Rfl: 1   Na Sulfate-K Sulfate-Mg Sulf 17.5-3.13-1.6 GM/177ML SOLN, Take 354 mLs by mouth once for 1 dose., Disp: 354 mL, Rfl: 0   omeprazole (PRILOSEC) 40 MG capsule, Take 1 capsule (40 mg total) by mouth 2 (two) times daily before a meal., Disp: 60 capsule, Rfl: 2   traMADol (ULTRAM) 50 MG tablet, Take by mouth., Disp: , Rfl:    valsartan (DIOVAN) 160 MG tablet, Take 1 tablet (160 mg total) by mouth daily., Disp: 90 tablet, Rfl: 1    Family History  Problem Relation Age of Onset   Hypertension Mother    Hyperlipidemia Mother    Diabetes Mother    Heart disease Mother    Stroke Mother    Heart disease Brother 38       CABG x 3    Heart attack Brother 38   Lupus Sister    Arthritis Sister    Asthma Sister    Depression Sister    Diabetes  Brother    Hypertension Brother    Obesity Brother    Early death Brother    36 / Stillbirths Sister    Stroke Maternal Grandfather    Breast cancer Neg Hx      Social History   Tobacco Use   Smoking status: Never   Smokeless tobacco: Never  Vaping Use   Vaping Use: Never used  Substance Use Topics   Alcohol use: No    Alcohol/week: 0.0 standard drinks    Comment: RARELY   Drug use: No    Allergies as of 05/26/2021 - Review Complete 05/26/2021  Allergen Reaction Noted   Vascepa [icosapent ethyl] Hives and Swelling 07/22/2014   Aspir-81 [aspirin] Other (See Comments) 07/22/2014   Statins Other (See Comments) 09/01/2015   Topamax [topiramate] Other (See Comments) 12/28/2012   Voltaren [diclofenac sodium] Hives and Other (See Comments) 12/28/2012   Lipitor [atorvastatin]  02/04/2015  Review of Systems:    All systems reviewed and negative except where noted in HPI.   Physical Exam:  BP (!) 141/84 (BP Location: Left Arm, Patient Position: Sitting, Cuff Size: Normal)    Pulse 83    Temp 98.7 F (37.1 C) (Oral)    Ht '5\' 6"'$  (1.676 m)    Wt 265 lb 6 oz (120.4 kg)    BMI 42.83 kg/m  No LMP recorded. Patient is postmenopausal.  General:   Alert,  Well-developed, well-nourished, pleasant and cooperative in NAD Head:  Normocephalic and atraumatic. Eyes:  Sclera clear, no icterus.   Conjunctiva pink. Ears:  Normal auditory acuity. Nose:  No deformity, discharge, or lesions. Mouth:  No deformity or lesions,oropharynx pink & moist. Neck:  Supple; no masses or thyromegaly. Lungs:  Respirations even and unlabored.  Clear throughout to auscultation.   No wheezes, crackles, or rhonchi. No acute distress. Heart:  Regular rate and rhythm; no murmurs, clicks, rubs, or gallops. Abdomen:  Normal bowel sounds. Soft, non-tender and non-distended without masses, hepatosplenomegaly or hernias noted.  No guarding or rebound tenderness.   Rectal: Not performed Msk:  Symmetrical  without gross deformities. Good, equal movement & strength bilaterally. Pulses:  Normal pulses noted. Extremities:  No clubbing or edema.  No cyanosis. Neurologic:  Alert and oriented x3;  grossly normal neurologically. Skin:  Intact without significant lesions or rashes. No jaundice. Lymph Nodes:  No significant cervical adenopathy. Psych:  Alert and cooperative. Normal mood and affect.  Imaging Studies: Reviewed  Assessment and Plan:   ISHANA BLADES is a 71 y.o. female with metabolic syndrome is seen in consultation for chronic symptoms of epigastric pain, lower abdominal pain, abdominal bloating as well as loose frequent nonbloody bowel movements Check celiac panel Check food allergy profile, alpha gal panel GI pathogen panel, pancreatic fecal elastase levels Recommend upper endoscopy and colonoscopy with possible TI evaluation, random colon biopsies Advised patient to go ahead and schedule CT abdomen and pelvis ordered by her PCP and undergo rest of the labs ordered by her PCP  Follow up in 3 months   Cephas Darby, MD

## 2021-05-28 ENCOUNTER — Telehealth: Payer: Self-pay

## 2021-05-28 ENCOUNTER — Ambulatory Visit (INDEPENDENT_AMBULATORY_CARE_PROVIDER_SITE_OTHER): Payer: Medicare HMO

## 2021-05-28 ENCOUNTER — Other Ambulatory Visit: Payer: Self-pay | Admitting: Gastroenterology

## 2021-05-28 ENCOUNTER — Other Ambulatory Visit: Payer: Self-pay

## 2021-05-28 DIAGNOSIS — R140 Abdominal distension (gaseous): Secondary | ICD-10-CM

## 2021-05-28 DIAGNOSIS — Z780 Asymptomatic menopausal state: Secondary | ICD-10-CM | POA: Diagnosis not present

## 2021-05-28 DIAGNOSIS — Z0000 Encounter for general adult medical examination without abnormal findings: Secondary | ICD-10-CM | POA: Diagnosis not present

## 2021-05-28 DIAGNOSIS — E039 Hypothyroidism, unspecified: Secondary | ICD-10-CM | POA: Diagnosis not present

## 2021-05-28 DIAGNOSIS — R194 Change in bowel habit: Secondary | ICD-10-CM

## 2021-05-28 DIAGNOSIS — Z1231 Encounter for screening mammogram for malignant neoplasm of breast: Secondary | ICD-10-CM

## 2021-05-28 DIAGNOSIS — E1169 Type 2 diabetes mellitus with other specified complication: Secondary | ICD-10-CM | POA: Diagnosis not present

## 2021-05-28 DIAGNOSIS — R1010 Upper abdominal pain, unspecified: Secondary | ICD-10-CM

## 2021-05-28 DIAGNOSIS — E785 Hyperlipidemia, unspecified: Secondary | ICD-10-CM | POA: Diagnosis not present

## 2021-05-28 DIAGNOSIS — R1013 Epigastric pain: Secondary | ICD-10-CM | POA: Diagnosis not present

## 2021-05-28 DIAGNOSIS — I10 Essential (primary) hypertension: Secondary | ICD-10-CM | POA: Diagnosis not present

## 2021-05-28 DIAGNOSIS — R1030 Lower abdominal pain, unspecified: Secondary | ICD-10-CM | POA: Diagnosis not present

## 2021-05-28 LAB — ALPHA-GAL PANEL
Allergen Lamb IgE: <0.1 kU/L
Beef IgE: <0.1 kU/L
IgE (Immunoglobulin E), Serum: 33 IU/mL (ref 6–495)
O215-IgE Alpha-Gal: <0.1 kU/L
Pork IgE: <0.1 kU/L

## 2021-05-28 LAB — FOOD ALLERGY PROFILE
Allergen Corn, IgE: <0.1 kU/L
Clam IgE: <0.1 kU/L
Codfish IgE: <0.1 kU/L
Egg White IgE: <0.1 kU/L
Milk IgE: <0.1 kU/L
Peanut IgE: <0.1 kU/L
Scallop IgE: 0.14 kU/L — AB
Sesame Seed IgE: <0.1 kU/L
Shrimp IgE: <0.1 kU/L
Soybean IgE: <0.1 kU/L
Walnut IgE: <0.1 kU/L
Wheat IgE: <0.1 kU/L

## 2021-05-28 LAB — CELIAC DISEASE PANEL
Endomysial IgA: NEGATIVE
IgA/Immunoglobulin A, Serum: 191 mg/dL (ref 87–352)
Transglutaminase IgA: <2 U/mL (ref 0–3)

## 2021-05-28 NOTE — Progress Notes (Signed)
Subjective:   Sarah Gordon is a 71 y.o. female who presents for Medicare Annual (Subsequent) preventive examination.  Virtual Visit via Video Note  I connected with  Sarah Gordon on 05/28/21 at  8:00 AM EST via telehealth video enabled device and verified that I am speaking with the correct person using two identifiers.  Location: Patient: home Provider: [place] Persons participating in the virtual visit: Sarah Gordon   I discussed the limitations, risks, security and privacy concerns of performing an evaluation and management service by telephone and the availability of in person appointments. The patient expressed understanding and agreed to proceed.  Some vital signs may be absent or patient reported.   Sarah Marker, LPN  Review of Systems     Cardiac Risk Factors include: advanced age (>53mn, >>12women);dyslipidemia;hypertension;sedentary lifestyle;diabetes mellitus;obesity (BMI >30kg/m2)     Objective:    There were no vitals filed for this visit. There is no height or weight on file to calculate BMI.  Advanced Directives 05/28/2021 05/27/2020 05/24/2019 02/25/2017 05/06/2016 01/23/2016 11/17/2015  Does Patient Have a Medical Advance Directive? No No No No No No No  Would patient like information on creating a medical advance directive? No - Patient declined Yes (MAU/Ambulatory/Procedural Areas - Information given) Yes (MAU/Ambulatory/Procedural Areas - Information given) No - Patient declined - No - patient declined information No - patient declined information    Current Medications (verified) Outpatient Encounter Medications as of 05/28/2021  Medication Sig   acetaminophen (TYLENOL) 650 MG CR tablet Take 1,300 mg by mouth 2 (two) times daily.   albuterol (VENTOLIN HFA) 108 (90 Base) MCG/ACT inhaler Inhale 2 puffs into the lungs every 6 (six) hours as needed for wheezing or shortness of breath.   Alirocumab (PRALUENT) 75 MG/ML SOAJ Inject 75 mg into the skin every  14 (fourteen) days.   DULoxetine (CYMBALTA) 60 MG capsule Take 1 capsule (60 mg total) by mouth daily.   ezetimibe (ZETIA) 10 MG tablet Take 1 tablet (10 mg total) by mouth daily.   fluticasone (FLOVENT HFA) 44 MCG/ACT inhaler Inhale 1 puff into the lungs in the morning and at bedtime.   levothyroxine (SYNTHROID) 100 MCG tablet TAKE 1 TABLET(100 MCG) BY MOUTH DAILY   metoprolol succinate (TOPROL-XL) 25 MG 24 hr tablet TAKE 1 TABLET(25 MG) BY MOUTH DAILY   montelukast (SINGULAIR) 10 MG tablet Take 1 tablet (10 mg total) by mouth at bedtime.   omeprazole (PRILOSEC) 40 MG capsule Take 1 capsule (40 mg total) by mouth 2 (two) times daily before a meal.   traMADol (ULTRAM) 50 MG tablet Take by mouth.   valsartan (DIOVAN) 160 MG tablet Take 1 tablet (160 mg total) by mouth daily.   hydrOXYzine (ATARAX/VISTARIL) 10 MG tablet Take 1 tablet (10 mg total) by mouth 3 (three) times daily as needed. (Patient not taking: Reported on 05/28/2021)   [DISCONTINUED] Fluticasone-Salmeterol (ADVAIR DISKUS) 250-50 MCG/DOSE AEPB Inhale 1 puff into the lungs 2 (two) times daily.   No facility-administered encounter medications on file as of 05/28/2021.    Allergies (verified) Vascepa [icosapent ethyl], Aspir-81 [aspirin], Gabapentin, Statins, Topamax [topiramate], Voltaren [diclofenac sodium], and Lipitor [atorvastatin]   History: Past Medical History:  Diagnosis Date   Allergy    Anxiety 95   Asthma    Cataract    Depression 95   Esophageal reflux    Gouty arthropathy, unspecified    Herpes simplex without mention of complication    Irritable bowel syndrome    Localized  osteoarthrosis not specified whether primary or secondary, unspecified site    Migraine, unspecified, without mention of intractable migraine without mention of status migrainosus    Mixed hyperlipidemia    Myalgia and myositis, unspecified    Obstructive sleep apnea (adult) (pediatric)    Palpitations    Sleep apnea    Type II or  unspecified type diabetes mellitus without mention of complication, not stated as uncontrolled    Umbilical hernia without mention of obstruction or gangrene    Unspecified essential hypertension    Unspecified hypothyroidism    Past Surgical History:  Procedure Laterality Date   BREAST BIOPSY Left    benign   COLONOSCOPY     Dr Sarah Gordon   COLONOSCOPY WITH PROPOFOL N/A 11/19/2015   Procedure: COLONOSCOPY WITH PROPOFOL;  Surgeon: Sarah Lye, MD;  Location: [facility] ENDOSCOPY;  Service: Endoscopy;  Laterality: N/A;   ESOPHAGOGASTRODUODENOSCOPY N/A 08/20/2014   Procedure: ESOPHAGOGASTRODUODENOSCOPY (EGD);  Surgeon: Sarah Lye, MD;  Location: [facility] ENDOSCOPY;  Service: Endoscopy;  Laterality: N/A;   FOOT SURGERY Left    heel spur   HERNIA REPAIR     umbilical   NASAL SINUS SURGERY  [id number]   UMBILICAL HERNIA REPAIR     Family History  Problem Relation Age of Onset   Hypertension Mother    Hyperlipidemia Mother    Diabetes Mother    Heart disease Mother    Stroke Mother    Varicose Veins Mother    Heart disease Brother        CABG x 3    Heart attack Brother 21   Hyperlipidemia Brother    Hypertension Brother    Lupus Sister    Arthritis Sister    Depression Sister    Obesity Sister    Asthma Sister    Depression Sister    Hypertension Sister    48 / Stillbirths Sister    Varicose Veins Sister    Depression Sister    Arthritis Sister    Heart disease Sister    Hyperlipidemia Sister    Hypertension Sister    59 / Stillbirths Sister    Varicose Veins Sister    Diabetes Brother    Hypertension Brother    Obesity Brother    Asthma Brother    Depression Brother    Hyperlipidemia Brother    Early death Brother    63 / Stillbirths Sister    Arthritis Sister    Depression Sister    Heart disease Sister    Hyperlipidemia Sister    Stroke Maternal Grandfather    Arthritis Brother    Depression Brother    Hyperlipidemia  Brother    Hypertension Brother    Breast cancer Neg Hx    Social History   Socioeconomic History   Marital status: Married    Spouse name: Sarah Gordon    Number of children: 1   Years of education: Not on file   Highest education level: Some college, no degree  Occupational History    Comment: works part time at [employer]  Tobacco Use   Smoking status: Never   Smokeless tobacco: Never  Vaping Use   Vaping Use: Never used  Substance and Sexual Activity   Alcohol use: No    Comment: RARELY   Drug use: No   Sexual activity: Not Currently    Birth control/protection: Post-menopausal  Other Topics Concern   Not on file  Social History Narrative   Not on file   Social Determinants  of Health   Financial Resource Strain: Low Risk    Difficulty of Paying Living Expenses: Not hard at all  Food Insecurity: No Food Insecurity   Worried About [place] in the Last Year: Never true   [place] in the Last Year: Never true  Transportation Needs: No Transportation Needs   Lack of Transportation (Medical): No   Lack of Transportation (Non-Medical): No  Physical Activity: Inactive   Days of Exercise per Week: 0 days   Minutes of Exercise per Session: 0 min  Stress: No Stress Concern Present   Feeling of Stress : Only a little  Social Connections: Engineer, building services of Communication with Friends and Family: More than three times a week   Frequency of Social Gatherings with Friends and Family: Three times a week   Attends Religious Services: More than 4 times per year   Active Member of Clubs or Organizations: Yes   Attends Archivist Meetings: 1 to 4 times per year   Marital Status: Married    Tobacco Counseling Counseling given: Not Answered   Clinical Intake:  Pre-visit preparation completed: Yes  Pain : No/denies pain     Nutritional Risks: None Diabetes: Yes CBG done?: No Did pt. bring in CBG monitor from home?: No  Nutrition  Risk Assessment:  Has the patient had any N/V/D within the last 2 months?  No  Does the patient have any non-healing wounds?  No  Has the patient had any unintentional weight loss or weight gain?  No   Diabetes:  Is the patient diabetic?  Yes  If diabetic, was a CBG obtained today?  No  Did the patient bring in their glucometer from home?  No  How often do you monitor your CBG's? Pt does not actively check blood sugar.   Financial Strains and Diabetes Management:  Are you having any financial strains with the device, your supplies or your medication? No .  Does the patient want to be seen by Chronic Care Management for management of their diabetes?  No  Would the patient like to be referred to a Nutritionist or for Diabetic Management?  No   Diabetic Exams:  Diabetic Eye Exam: Completed 07/09/20.   Diabetic Foot Exam: Completed 01/22/20. Pt has been advised about the importance in completing this exam.   How often do you need to have someone help you when you read instructions, pamphlets, or other written materials from your doctor or pharmacy?: 1 - Never    Interpreter Needed?: No  Information entered by :: Sarah Marker LPN   Activities of Daily Living In your present state of health, do you have any difficulty performing the following activities: 05/28/2021 03/31/2021  Hearing? N N  Vision? N N  Difficulty concentrating or making decisions? N N  Walking or climbing stairs? Y Y  Dressing or bathing? N N  Doing errands, shopping? N N  Preparing Food and eating ? N -  Using the Toilet? N -  In the past six months, have you accidently leaked urine? Y -  Do you have problems with loss of bowel control? N -  Managing your Medications? N -  Managing your Finances? N -  Housekeeping or managing your Housekeeping? N -  Some recent data might be hidden    Patient Care Team: Steele Sizer, MD as PCP - General (Family Medicine) [place], [place] as Consulting Physician  (Optometry)  Indicate any recent Medical  Services you may have received from other than [facility] providers in the past year (date may be approximate).     Assessment:   This is a routine wellness examination for Tania.  Hearing/Vision screen Hearing Screening - Comments:: Pt denies hearing difficulty Vision Screening - Comments:: Annual vision screenings done at Home issues and exercise activities discussed: Current Exercise Habits: The patient does not participate in regular exercise at present, Exercise limited by: orthopedic condition(s)   Goals Addressed             This Visit's Progress    DIET - INCREASE WATER INTAKE   Not on track    Recommend drinking 6-8 glasses of water per day        Depression Screen PHQ 2/9 Scores 05/28/2021 03/31/2021 02/19/2021 11/25/2020 10/10/2020 06/03/2020 05/27/2020  PHQ - 2 Score 0 0 0 2 1 0 0  PHQ- 9 Score 3 0 0 '5 5 2 2    '$ Fall Risk Fall Risk  05/28/2021 03/31/2021 02/19/2021 11/25/2020 10/10/2020  Falls in the past year? '1 1 1 1 1  '$ Number falls in past yr: '1 1 1 1 '$ 0  Injury with Fall? '1 1 1 1 1  '$ Risk for fall due to : History of fall(s) No Fall Risks No Fall Risks Impaired balance/gait -  Follow up Falls prevention discussed Falls prevention discussed Falls prevention discussed Falls prevention discussed -    FALL RISK PREVENTION PERTAINING TO THE HOME:  Any stairs in or around the home? No  If so, are there any without handrails? No  Home free of loose throw rugs in walkways, pet beds, electrical cords, etc? Yes  Adequate lighting in your home to reduce risk of falls? Yes   ASSISTIVE DEVICES UTILIZED TO PREVENT FALLS:  Life alert? No  Use of a cane, walker or w/c? Yes  Grab bars in the bathroom? Yes  Shower chair or bench in shower? No  Elevated toilet seat or a handicapped toilet? Yes   TIMED UP AND GO:  Was the test performed? No . Virtual visit.   Cognitive Function: Normal cognitive status assessed by direct  observation by this Nurse Health Advisor. No abnormalities found.        6CIT Screen 05/24/2019  What Year? 0 points  What month? 0 points  What time? 0 points  Count back from 20 0 points  Months in reverse 0 points  Repeat phrase 0 points  Total Score 0    Immunizations Immunization History  Administered Date(s) Administered   Fluad Quad(high Dose 65+) 12/06/2018, 01/22/2020, 11/25/2020   Hepatitis B, adult 02/27/2018   Hepb-cpg 12/06/2018, 10/10/2020   Influenza, High Dose Seasonal PF 02/27/2018   PFIZER(Purple Top)SARS-COV-2 Vaccination 04/26/2019, 05/22/2019, 12/24/2019, 10/18/2020   Pneumococcal Conjugate-13 07/30/2013   Pneumococcal Polysaccharide-23 10/12/2011, 02/27/2018   Tdap 10/12/2011   Zoster Recombinat (Shingrix) 10/18/2020   Zoster, Live 08/06/2010    TDAP status: Up to date  Flu Vaccine status: Up to date  Pneumococcal vaccine status: Up to date  Covid-19 vaccine status: Completed vaccines  Qualifies for Shingles Vaccine? Yes   Zostavax completed Yes   Shingrix Completed?: Yes due for second dose  Screening Tests Health Maintenance  Topic Date Due   COLONOSCOPY (Pts 45-59yr Insurance coverage will need to be confirmed)  11/18/2020   COVID-19 Vaccine (5 - Booster for Pfizer series) 12/13/2020   Zoster Vaccines- Shingrix (2 of 2) 12/13/2020   FOOT EXAM  01/21/2021   HEMOGLOBIN  A1C  05/25/2021   OPHTHALMOLOGY EXAM  07/09/2021   TETANUS/TDAP  10/11/2021   MAMMOGRAM  05/23/2022   Pneumonia Vaccine 71+ Years old  Completed   INFLUENZA VACCINE  Completed   DEXA SCAN  Completed   Hepatitis C Screening  Completed   HPV VACCINES  Aged Out    Health Maintenance  Health Maintenance Due  Topic Date Due   COLONOSCOPY (Pts 45-20yr Insurance coverage will need to be confirmed)  11/18/2020   COVID-19 Vaccine (5 - Booster for Pfizer series) 12/13/2020   Zoster Vaccines- Shingrix (2 of 2) 12/13/2020   FOOT EXAM  01/21/2021   HEMOGLOBIN A1C  05/25/2021     Colorectal cancer screening: Type of screening: Colonoscopy. Completed 11/19/15. Repeat every 5 years  Mammogram status: Completed 05/22/20. Repeat every year. Ordered today.   Bone Density status: Completed 11/20/14. Results reflect: Bone density results: NORMAL. Repeat every 2 years. Ordered today.   Lung Cancer Screening: (Low Dose CT Chest recommended if Age 69-80years, 30 pack-year currently smoking OR have quit w/in 15years.) does not qualify.   Additional Screening:  Hepatitis C Screening: does qualify; Completed 10/23/15  Vision Screening: Recommended annual ophthalmology exams for early detection of glaucoma and other disorders of the eye. Is the patient up to date with their annual eye exam?  Yes  Who is the provider or what is the name of the [facility] the [facility]   Dental Screening: Recommended annual dental exams for proper oral hygiene  Community Resource Referral / Chronic Care Management: CRR required this visit?  No   CCM required this visit?  No      Plan:     I have personally reviewed and noted the following in the patients chart:   Medical and social history Use of alcohol, tobacco or illicit drugs  Current medications and supplements including opioid prescriptions.  Functional ability and status Nutritional status Physical activity Advanced directives List of other physicians Hospitalizations, surgeries, and ER visits in previous 12 months Vitals Screenings to include cognitive, depression, and falls Referrals and appointments  In addition, I have reviewed and discussed with patient certain preventive protocols, quality metrics, and best practice recommendations. A written personalized care plan for preventive services as well as general preventive health recommendations were provided to patient.     KClemetine Marker LPN   [id number]/11/[id number]  Nurse Notes: pt states she would like to complete abdominal CT  previously ordered by Dr. SAncil Boozer Order is now expired but pt would like to complete prior to upcoming colonoscopy on 06/18/21. Telephone encounter updated and sent to provider.

## 2021-05-28 NOTE — Telephone Encounter (Signed)
AWV completed with patient today. Pt is scheduled for colonoscopy 06/18/21 with Dr. Marius Ditch. Pt would like to complete abdominal CT scan ordered by Dr. Ancil Boozer 02/2020. Order is now expired and patient needs new order for imaging in order to complete prior to colonoscopy.  ?

## 2021-05-28 NOTE — Telephone Encounter (Signed)
Copied from [place] [id number]. Topic: General - Other ?>> May 27, 2021  3:02 PM Sarah Gordon wrote: ?Reason for CRM: Pt called in stating she recently seen PCP in January for abdominal pain, but when the referring office reached out to schedule her she was feeling better and didn't go, pt states PCP advised her to get the CT scan, but once she reached out to the referring office, they no longer had the request and needs another sent to [place] ?

## 2021-05-28 NOTE — Patient Instructions (Signed)
Sarah Gordon , Thank you for taking time to come for your Medicare Wellness Visit. I appreciate your ongoing commitment to your health goals. Please review the following plan we discussed and let me know if I can assist you in the future.   Screening recommendations/referrals: Colonoscopy: done 11/19/15. Scheduled for 06/18/21 Mammogram: done 05/22/20. Please call [phone] to schedule your mammogram and bone density screening. Bone Density: done 11/20/14 Recommended yearly ophthalmology/optometry visit for glaucoma screening and checkup Recommended yearly dental visit for hygiene and checkup  Vaccinations: Influenza vaccine: done 11/25/20 Pneumococcal vaccine: done 02/27/18 Tdap vaccine: done 10/12/11 Shingles vaccine: done 10/18/20; due for second dose Covid-19:done 04/26/19, 05/22/19, 12/24/19 & 10/18/20  Advanced directives: Please bring a copy of your health care power of attorney and living will to the office at your convenience once you have completed those documents.   Conditions/risks identified: Recommend drinking 6-8 glasses of water per day.  Next appointment: Follow up in one year for your annual wellness visit    Preventive Care 65 Years and Older, Female Preventive care refers to lifestyle choices and visits with your health care provider that can promote health and wellness. What does preventive care include? A yearly physical exam. This is also called an annual well check. Dental exams once or twice a year. Routine eye exams. Ask your health care provider how often you should have your eyes checked. Personal lifestyle choices, including: Daily care of your teeth and gums. Regular physical activity. Eating a healthy diet. Avoiding tobacco and drug use. Limiting alcohol use. Practicing safe sex. Taking low-dose aspirin every day. Taking vitamin and mineral supplements as recommended by your health care provider. What happens during an annual well check? The services and  screenings done by your health care provider during your annual well check will depend on your age, overall health, lifestyle risk factors, and family history of disease. Counseling  Your health care provider may ask you questions about your: Alcohol use. Tobacco use. Drug use. Emotional well-being. Home and relationship well-being. Sexual activity. Eating habits. History of falls. Memory and ability to understand (cognition). Work and work Statistician. Reproductive health. Screening  You may have the following tests or measurements: Height, weight, and BMI. Blood pressure. Lipid and cholesterol levels. These may be checked every 5 years, or more frequently if you are over 38 years old. Skin check. Lung cancer screening. You may have this screening every year starting at age 33 if you have a 30-pack-year history of smoking and currently smoke or have quit within the past 15 years. Fecal occult blood test (FOBT) of the stool. You may have this test every year starting at age 66. Flexible sigmoidoscopy or colonoscopy. You may have a sigmoidoscopy every 5 years or a colonoscopy every 10 years starting at age 15. Hepatitis C blood test. Hepatitis B blood test. Sexually transmitted disease (STD) testing. Diabetes screening. This is done by checking your blood sugar (glucose) after you have not eaten for a while (fasting). You may have this done every 1-3 years. Bone density scan. This is done to screen for osteoporosis. You may have this done starting at age 38. Mammogram. This may be done every 1-2 years. Talk to your health care provider about how often you should have regular mammograms. Talk with your health care provider about your test results, treatment options, and if necessary, the need for more tests. Vaccines  Your health care provider may recommend certain vaccines, such as: Influenza vaccine. This is recommended every year.  Tetanus, diphtheria, and acellular pertussis (Tdap,  Td) vaccine. You may need a Td booster every 10 years. Zoster vaccine. You may need this after age 51. Pneumococcal 13-valent conjugate (PCV13) vaccine. One dose is recommended after age 64. Pneumococcal polysaccharide (PPSV23) vaccine. One dose is recommended after age 12. Talk to your health care provider about which screenings and vaccines you need and how often you need them. This information is not intended to replace advice given to you by your health care provider. Make sure you discuss any questions you have with your health care provider. Document Released: 04/04/2015 Document Revised: 11/26/2015 Document Reviewed: 01/07/2015 Elsevier Interactive Patient Education  2017 [employer] Prevention in the Home Falls can cause injuries. They can happen to people of all ages. There are many things you can do to make your home safe and to help prevent falls. What can I do on the outside of my home? Regularly fix the edges of walkways and driveways and fix any cracks. Remove anything that might make you trip as you walk through a door, such as a raised step or threshold. Trim any bushes or trees on the path to your home. Use bright outdoor lighting. Clear any walking paths of anything that might make someone trip, such as rocks or tools. Regularly check to see if handrails are loose or broken. Make sure that both sides of any steps have handrails. Any raised decks and porches should have guardrails on the edges. Have any leaves, snow, or ice cleared regularly. Use sand or salt on walking paths during winter. Clean up any spills in your garage right away. This includes oil or grease spills. What can I do in the bathroom? Use night lights. Install grab bars by the toilet and in the tub and shower. Do not use towel bars as grab bars. Use non-skid mats or decals in the tub or shower. If you need to sit down in the shower, use a plastic, non-slip stool. Keep the floor dry. Clean up any  water that spills on the floor as soon as it happens. Remove soap buildup in the tub or shower regularly. Attach bath mats securely with double-sided non-slip rug tape. Do not have throw rugs and other things on the floor that can make you trip. What can I do in the bedroom? Use night lights. Make sure that you have a light by your bed that is easy to reach. Do not use any sheets or blankets that are too big for your bed. They should not hang down onto the floor. Have a firm chair that has side arms. You can use this for support while you get dressed. Do not have throw rugs and other things on the floor that can make you trip. What can I do in the kitchen? Clean up any spills right away. Avoid walking on wet floors. Keep items that you use a lot in easy-to-reach places. If you need to reach something above you, use a strong step stool that has a grab bar. Keep electrical cords out of the way. Do not use floor polish or wax that makes floors slippery. If you must use wax, use non-skid floor wax. Do not have throw rugs and other things on the floor that can make you trip. What can I do with my stairs? Do not leave any items on the stairs. Make sure that there are handrails on both sides of the stairs and use them. Fix handrails that are broken or loose.  Make sure that handrails are as long as the stairways. Check any carpeting to make sure that it is firmly attached to the stairs. Fix any carpet that is loose or worn. Avoid having throw rugs at the top or bottom of the stairs. If you do have throw rugs, attach them to the floor with carpet tape. Make sure that you have a light switch at the top of the stairs and the bottom of the stairs. If you do not have them, ask someone to add them for you. What else can I do to help prevent falls? Wear shoes that: Do not have high heels. Have rubber bottoms. Are comfortable and fit you well. Are closed at the toe. Do not wear sandals. If you use a  stepladder: Make sure that it is fully opened. Do not climb a closed stepladder. Make sure that both sides of the stepladder are locked into place. Ask someone to hold it for you, if possible. Clearly mark and make sure that you can see: Any grab bars or handrails. First and last steps. Where the edge of each step is. Use tools that help you move around (mobility aids) if they are needed. These include: Canes. Walkers. Scooters. Crutches. Turn on the lights when you go into a dark area. Replace any light bulbs as soon as they burn out. Set up your furniture so you have a clear path. Avoid moving your furniture around. If any of your floors are uneven, fix them. If there are any pets around you, be aware of where they are. Review your medicines with your doctor. Some medicines can make you feel dizzy. This can increase your chance of falling. Ask your doctor what other things that you can do to help prevent falls. This information is not intended to replace advice given to you by your health care provider. Make sure you discuss any questions you have with your health care provider. Document Released: 01/02/2009 Document Revised: 08/14/2015 Document Reviewed: 04/12/2014 Elsevier Interactive Patient Education  2017 [employer].

## 2021-05-29 LAB — CBC WITH DIFFERENTIAL/PLATELET
Absolute Monocytes: 241 cells/uL (ref 200–950)
Basophils Absolute: 41 cells/uL (ref 0–200)
Basophils Relative: 1.1 %
Eosinophils Absolute: 89 cells/uL (ref 15–500)
Eosinophils Relative: 2.4 %
HCT: 40.2 % (ref 35.0–45.0)
Hemoglobin: 13.5 g/dL (ref 11.7–15.5)
Lymphs Abs: 829 cells/uL — ABNORMAL LOW (ref 850–3900)
MCH: 31.8 pg (ref 27.0–33.0)
MCHC: 33.6 g/dL (ref 32.0–36.0)
MCV: 94.8 fL (ref 80.0–100.0)
MPV: 9.8 fL (ref 7.5–12.5)
Monocytes Relative: 6.5 %
Neutro Abs: 2501 cells/uL (ref 1500–7800)
Neutrophils Relative %: 67.6 %
Platelets: 284 10*3/uL (ref 140–400)
RBC: 4.24 10*6/uL (ref 3.80–5.10)
RDW: 13.6 % (ref 11.0–15.0)
Total Lymphocyte: 22.4 %
WBC: 3.7 10*3/uL — ABNORMAL LOW (ref 3.8–10.8)

## 2021-05-29 LAB — MICROALBUMIN / CREATININE URINE RATIO
Creatinine, Urine: 140 mg/dL (ref 20–275)
Microalb Creat Ratio: 44 mcg/mg creat — ABNORMAL HIGH (ref <30)
Microalb, Ur: 6.1 mg/dL

## 2021-05-29 LAB — COMPLETE METABOLIC PANEL WITH GFR
AG Ratio: 1.5 (calc) (ref 1.0–2.5)
ALT: 17 U/L (ref 6–29)
AST: 20 U/L (ref 10–35)
Albumin: 3.9 g/dL (ref 3.6–5.1)
Alkaline phosphatase (APISO): 75 U/L (ref 37–153)
BUN: 16 mg/dL (ref 7–25)
CO2: 27 mmol/L (ref 20–32)
Calcium: 9.1 mg/dL (ref 8.6–10.4)
Chloride: 107 mmol/L (ref 98–110)
Creat: 0.97 mg/dL (ref 0.50–1.05)
Globulin: 2.6 g/dL (calc) (ref 1.9–3.7)
Glucose, Bld: 126 mg/dL — ABNORMAL HIGH (ref 65–99)
Potassium: 4.4 mmol/L (ref 3.5–5.3)
Sodium: 141 mmol/L (ref 135–146)
Total Bilirubin: 0.4 mg/dL (ref 0.2–1.2)
Total Protein: 6.5 g/dL (ref 6.1–8.1)
eGFR: 64 mL/min/{1.73_m2} (ref >=60)

## 2021-05-29 LAB — LIPID PANEL
Cholesterol: 170 mg/dL (ref <200)
HDL: 38 mg/dL — ABNORMAL LOW (ref >=50)
LDL Cholesterol (Calc): 98 mg/dL (calc)
Non-HDL Cholesterol (Calc): 132 mg/dL (calc) — ABNORMAL HIGH (ref <130)
Total CHOL/HDL Ratio: 4.5 (calc) (ref <5.0)
Triglycerides: 224 mg/dL — ABNORMAL HIGH (ref <150)

## 2021-05-29 LAB — HEMOGLOBIN A1C
Hgb A1c MFr Bld: 6.1 % of total Hgb — ABNORMAL HIGH (ref <5.7)
Mean Plasma Glucose: 128 mg/dL
eAG (mmol/L): 7.1 mmol/L

## 2021-05-29 LAB — TSH: TSH: 1.82 mIU/L (ref 0.40–4.50)

## 2021-05-30 ENCOUNTER — Encounter: Payer: Self-pay | Admitting: Gastroenterology

## 2021-06-03 LAB — PANCREATIC ELASTASE, FECAL: Pancreatic Elastase, Fecal: 235 ug Elast./g (ref >200)

## 2021-06-04 ENCOUNTER — Other Ambulatory Visit: Payer: Self-pay

## 2021-06-04 ENCOUNTER — Ambulatory Visit
Admission: RE | Admit: 2021-06-04 | Discharge: 2021-06-04 | Disposition: A | Discharge status: Home / Self Care | Payer: Medicare HMO | Source: Ambulatory Visit | Attending: Family Medicine | Admitting: Family Medicine

## 2021-06-04 DIAGNOSIS — R194 Change in bowel habit: Secondary | ICD-10-CM | POA: Insufficient documentation

## 2021-06-04 DIAGNOSIS — R1010 Upper abdominal pain, unspecified: Secondary | ICD-10-CM | POA: Diagnosis not present

## 2021-06-04 DIAGNOSIS — R911 Solitary pulmonary nodule: Secondary | ICD-10-CM | POA: Diagnosis not present

## 2021-06-04 DIAGNOSIS — R140 Abdominal distension (gaseous): Secondary | ICD-10-CM | POA: Diagnosis not present

## 2021-06-04 DIAGNOSIS — N281 Cyst of kidney, acquired: Secondary | ICD-10-CM | POA: Diagnosis not present

## 2021-06-04 MED ORDER — IOHEXOL 300 MG/ML  SOLN
100.0000 mL | Freq: Once | INTRAMUSCULAR | Status: AC | PRN
  Administered 2021-06-04: 100 mL via INTRAVENOUS

## 2021-06-13 ENCOUNTER — Other Ambulatory Visit: Payer: Self-pay | Admitting: Family Medicine

## 2021-06-13 DIAGNOSIS — E1169 Type 2 diabetes mellitus with other specified complication: Secondary | ICD-10-CM

## 2021-06-18 ENCOUNTER — Encounter: Payer: Self-pay | Admitting: Gastroenterology

## 2021-06-18 ENCOUNTER — Ambulatory Visit: Payer: Medicare HMO | Admitting: Registered Nurse

## 2021-06-18 ENCOUNTER — Encounter
Admission: RE | Disposition: A | Discharge status: Home / Self Care | Payer: Self-pay | Source: Home / Self Care | Attending: Gastroenterology

## 2021-06-18 ENCOUNTER — Ambulatory Visit
Admission: RE | Admit: 2021-06-18 | Discharge: 2021-06-18 | Disposition: A | Discharge status: Home / Self Care | Payer: Medicare HMO | Attending: Gastroenterology | Admitting: Gastroenterology

## 2021-06-18 DIAGNOSIS — D124 Benign neoplasm of descending colon: Secondary | ICD-10-CM | POA: Diagnosis not present

## 2021-06-18 DIAGNOSIS — I10 Essential (primary) hypertension: Secondary | ICD-10-CM | POA: Insufficient documentation

## 2021-06-18 DIAGNOSIS — R1013 Epigastric pain: Secondary | ICD-10-CM | POA: Insufficient documentation

## 2021-06-18 DIAGNOSIS — G4733 Obstructive sleep apnea (adult) (pediatric): Secondary | ICD-10-CM | POA: Diagnosis not present

## 2021-06-18 DIAGNOSIS — R197 Diarrhea, unspecified: Secondary | ICD-10-CM | POA: Diagnosis not present

## 2021-06-18 DIAGNOSIS — J45909 Unspecified asthma, uncomplicated: Secondary | ICD-10-CM | POA: Insufficient documentation

## 2021-06-18 DIAGNOSIS — K219 Gastro-esophageal reflux disease without esophagitis: Secondary | ICD-10-CM | POA: Diagnosis not present

## 2021-06-18 DIAGNOSIS — R140 Abdominal distension (gaseous): Secondary | ICD-10-CM | POA: Insufficient documentation

## 2021-06-18 DIAGNOSIS — K529 Noninfective gastroenteritis and colitis, unspecified: Secondary | ICD-10-CM | POA: Diagnosis not present

## 2021-06-18 DIAGNOSIS — R1030 Lower abdominal pain, unspecified: Secondary | ICD-10-CM | POA: Diagnosis not present

## 2021-06-18 DIAGNOSIS — F419 Anxiety disorder, unspecified: Secondary | ICD-10-CM | POA: Diagnosis not present

## 2021-06-18 DIAGNOSIS — Z6841 Body Mass Index (BMI) 40.0 and over, adult: Secondary | ICD-10-CM | POA: Diagnosis not present

## 2021-06-18 DIAGNOSIS — E039 Hypothyroidism, unspecified: Secondary | ICD-10-CM | POA: Diagnosis not present

## 2021-06-18 DIAGNOSIS — D122 Benign neoplasm of ascending colon: Secondary | ICD-10-CM | POA: Insufficient documentation

## 2021-06-18 DIAGNOSIS — K635 Polyp of colon: Secondary | ICD-10-CM | POA: Diagnosis not present

## 2021-06-18 DIAGNOSIS — E782 Mixed hyperlipidemia: Secondary | ICD-10-CM | POA: Diagnosis not present

## 2021-06-18 DIAGNOSIS — F32A Depression, unspecified: Secondary | ICD-10-CM | POA: Diagnosis not present

## 2021-06-18 DIAGNOSIS — R1310 Dysphagia, unspecified: Secondary | ICD-10-CM | POA: Diagnosis not present

## 2021-06-18 DIAGNOSIS — R69 Illness, unspecified: Secondary | ICD-10-CM | POA: Diagnosis not present

## 2021-06-18 HISTORY — PX: COLONOSCOPY WITH PROPOFOL: SHX5780

## 2021-06-18 HISTORY — PX: ESOPHAGOGASTRODUODENOSCOPY (EGD) WITH PROPOFOL: SHX5813

## 2021-06-18 SURGERY — COLONOSCOPY WITH PROPOFOL
Anesthesia: General

## 2021-06-18 MED ORDER — LIDOCAINE HCL (CARDIAC) PF 100 MG/5ML IV SOSY
PREFILLED_SYRINGE | INTRAVENOUS | Status: DC | PRN
  Administered 2021-06-18: 100 mg via INTRAVENOUS

## 2021-06-18 MED ORDER — SODIUM CHLORIDE 0.9 % IV SOLN: INTRAVENOUS | Status: DC

## 2021-06-18 MED ORDER — DEXMEDETOMIDINE HCL 200 MCG/2ML IV SOLN
INTRAVENOUS | Status: DC | PRN
  Administered 2021-06-18: 20 ug via INTRAVENOUS

## 2021-06-18 MED ORDER — KETAMINE HCL 50 MG/ML IJ SOLN
INTRAMUSCULAR | Status: DC | PRN
  Administered 2021-06-18: 50 mg via INTRAMUSCULAR

## 2021-06-18 MED ORDER — KETAMINE HCL 50 MG/ML IJ SOLN
INTRAMUSCULAR | Status: AC
  Filled 2021-06-18: qty 10

## 2021-06-18 MED ORDER — PROPOFOL 500 MG/50ML IV EMUL
INTRAVENOUS | Status: DC | PRN
  Administered 2021-06-18: 125 ug/kg/min via INTRAVENOUS

## 2021-06-18 MED ORDER — PROPOFOL 10 MG/ML IV BOLUS
INTRAVENOUS | Status: DC | PRN
Start: 2021-06-18 — End: 2021-06-18
  Administered 2021-06-18: 50 mg via INTRAVENOUS

## 2021-06-18 NOTE — H&P (Addendum)
?Cephas Darby, MD ?[address]  ?[address]  ?[place], [place] [address]  ?Main: [phone]  ?Fax: [phone] ?Pager: [phone] ? ?Primary Care Physician:  Steele Sizer, MD ?Primary Gastroenterologist:  Dr. Cephas Darby ? ?Pre-Procedure History & Physical: ?HPI:  Sarah Gordon is a 71 y.o. female is here for an endoscopy and colonoscopy. ?  ?Past Medical History:  ?Diagnosis Date  ? Allergy   ? Anxiety ?95  ? Asthma   ? Cataract   ? Depression ?95  ? Esophageal reflux   ? Gouty arthropathy, unspecified   ? Herpes simplex without mention of complication   ? Irritable bowel syndrome   ? Localized osteoarthrosis not specified whether primary or secondary, unspecified site   ? Migraine, unspecified, without mention of intractable migraine without mention of status migrainosus   ? Mixed hyperlipidemia   ? Myalgia and myositis, unspecified   ? Obstructive sleep apnea (adult) (pediatric)   ? Palpitations   ? Sleep apnea   ? Type II or unspecified type diabetes mellitus without mention of complication, not stated as uncontrolled   ? Umbilical hernia without mention of obstruction or gangrene   ? Unspecified essential hypertension   ? Unspecified hypothyroidism   ? ? ?Past Surgical History:  ?Procedure Laterality Date  ? BREAST BIOPSY Left   ? benign  ? COLONOSCOPY    ? Dr Alveta Heimlich  ? COLONOSCOPY WITH PROPOFOL N/A 11/19/2015  ? Procedure: COLONOSCOPY WITH PROPOFOL;  Surgeon: Christene Lye, MD;  Location: [facility] ENDOSCOPY;  Service: Endoscopy;  Laterality: N/A;  ? ESOPHAGOGASTRODUODENOSCOPY N/A 08/20/2014  ? Procedure: ESOPHAGOGASTRODUODENOSCOPY (EGD);  Surgeon: Christene Lye, MD;  Location: [facility] ENDOSCOPY;  Service: Endoscopy;  Laterality: N/A;  ? FOOT SURGERY Left   ? heel spur  ? HERNIA REPAIR    ? umbilical  ? NASAL SINUS SURGERY  1980s  ? UMBILICAL HERNIA REPAIR    ? ? ?Prior to Admission medications   ?Medication Sig Start Date End Date Taking? Authorizing Provider  ?acetaminophen (TYLENOL)  650 MG CR tablet Take 1,300 mg by mouth 2 (two) times daily.    [provider]  ?albuterol (VENTOLIN HFA) 108 (90 Base) MCG/ACT inhaler Inhale 2 puffs into the lungs every 6 (six) hours as needed for wheezing or shortness of breath. 05/23/20   Steele Sizer, MD  ?DULoxetine (CYMBALTA) 60 MG capsule Take 1 capsule (60 mg total) by mouth daily. 03/31/21   Steele Sizer, MD  ?ezetimibe (ZETIA) 10 MG tablet Take 1 tablet (10 mg total) by mouth daily. 03/31/21   Steele Sizer, MD  ?fluticasone (FLOVENT HFA) 44 MCG/ACT inhaler Inhale 1 puff into the lungs in the morning and at bedtime. 01/15/21   Mar Daring, PA-C  ?hydrOXYzine (ATARAX/VISTARIL) 10 MG tablet Take 1 tablet (10 mg total) by mouth 3 (three) times daily as needed. ?Patient not taking: Reported on 05/28/2021 05/23/20   Steele Sizer, MD  ?levothyroxine (SYNTHROID) 100 MCG tablet TAKE 1 TABLET(100 MCG) BY MOUTH DAILY 11/25/20   Steele Sizer, MD  ?metoprolol succinate (TOPROL-XL) 25 MG 24 hr tablet TAKE 1 TABLET(25 MG) BY MOUTH DAILY 03/31/21   Steele Sizer, MD  ?montelukast (SINGULAIR) 10 MG tablet Take 1 tablet (10 mg total) by mouth at bedtime. 03/31/21   Steele Sizer, MD  ?omeprazole (PRILOSEC) 40 MG capsule Take 1 capsule (40 mg total) by mouth 2 (two) times daily before a meal. 04/21/21   Latina Frank, Tally Due, MD  ?PRALUENT 75 MG/ML SOAJ INJECT '75MG'$  INTO  THE SKIN EVERY 14 DAYS 06/15/21   Steele Sizer, MD  ?traMADol Veatrice Bourbon) 50 MG tablet Take by mouth. 05/12/21   [provider]  ?valsartan (DIOVAN) 160 MG tablet Take 1 tablet (160 mg total) by mouth daily. 03/31/21   Steele Sizer, MD  ?Fluticasone-Salmeterol (ADVAIR DISKUS) 250-50 MCG/DOSE AEPB Inhale 1 puff into the lungs 2 (two) times daily. 04/11/19 07/22/19  Steele Sizer, MD  ? ? ?Allergies as of 05/26/2021 - Review Complete 05/26/2021  ?Allergen Reaction Noted  ? Vascepa [icosapent ethyl] Hives and Swelling 07/22/2014  ? Aspir-81 [aspirin] Other (See Comments)  07/22/2014  ? Statins Other (See Comments) 09/01/2015  ? Topamax [topiramate] Other (See Comments) 12/28/2012  ? Voltaren [diclofenac sodium] Hives and Other (See Comments) 12/28/2012  ? Lipitor [atorvastatin]  02/04/2015  ? ? ?Family History  ?Problem Relation Age of Onset  ? Hypertension Mother   ? Hyperlipidemia Mother   ? Diabetes Mother   ? Heart disease Mother   ? Stroke Mother   ? Varicose Veins Mother   ? Heart disease Brother   ?     CABG x 3   ? Heart attack Brother 9  ? Hyperlipidemia Brother   ? Hypertension Brother   ? Lupus Sister   ? Arthritis Sister   ? Depression Sister   ? Obesity Sister   ? Asthma Sister   ? Depression Sister   ? Hypertension Sister   ? Miscarriages / Stillbirths Sister   ? Varicose Veins Sister   ? Depression Sister   ? Arthritis Sister   ? Heart disease Sister   ? Hyperlipidemia Sister   ? Hypertension Sister   ? Miscarriages / Stillbirths Sister   ? Varicose Veins Sister   ? Diabetes Brother   ? Hypertension Brother   ? Obesity Brother   ? Asthma Brother   ? Depression Brother   ? Hyperlipidemia Brother   ? Early death Brother   ? Miscarriages / Stillbirths Sister   ? Arthritis Sister   ? Depression Sister   ? Heart disease Sister   ? Hyperlipidemia Sister   ? Stroke Maternal Grandfather   ? Arthritis Brother   ? Depression Brother   ? Hyperlipidemia Brother   ? Hypertension Brother   ? Breast cancer Neg Hx   ? ? ?Social History  ? ?Socioeconomic History  ? Marital status: Married  ?  Spouse name: Georgena Spurling   ? Number of children: 1  ? Years of education: Not on file  ? Highest education level: Some college, no degree  ?Occupational History  ?  Comment: works part time at [employer]  ?Tobacco Use  ? Smoking status: Never  ? Smokeless tobacco: Never  ?Vaping Use  ? Vaping Use: Never used  ?Substance and Sexual Activity  ? Alcohol use: No  ?  Comment: RARELY  ? Drug use: No  ? Sexual activity: Not Currently  ?  Birth control/protection: Post-menopausal  ?Other Topics Concern  ? Not on  file  ?Social History Narrative  ? Not on file  ? ?Social Determinants of Health  ? ?Financial Resource Strain: Low Risk   ? Difficulty of Paying Living Expenses: Not hard at all  ?Food Insecurity: No Food Insecurity  ? Worried About Charity fundraiser in the Last Year: Never true  ? Ran Out of Food in the Last Year: Never true  ?Transportation Needs: No Transportation Needs  ? Lack of Transportation (Medical): No  ? Lack of Transportation (Non-Medical): No  ?  Physical Activity: Inactive  ? Days of Exercise per Week: 0 days  ? Minutes of Exercise per Session: 0 min  ?Stress: No Stress Concern Present  ? Feeling of Stress : Only a little  ?Social Connections: Socially Integrated  ? Frequency of Communication with Friends and Family: More than three times a week  ? Frequency of Social Gatherings with Friends and Family: Three times a week  ? Attends Religious Services: More than 4 times per year  ? Active Member of Clubs or Organizations: Yes  ? Attends Archivist Meetings: 1 to 4 times per year  ? Marital Status: Married  ?Intimate Partner Violence: Not At Risk  ? Fear of Current or Ex-Partner: No  ? Emotionally Abused: No  ? Physically Abused: No  ? Sexually Abused: No  ? ? ?Review of Systems: ?See HPI, otherwise negative ROS ? ?Physical Exam: ?BP (!) 151/93   Pulse 97   Temp (!) 96.8 ?F (36 ?C) (Temporal)   Resp 17   Ht '5\' 6"'$  (1.676 m)   Wt 120.2 kg   SpO2 100%   BMI 42.77 kg/m?  ?General:   Alert,  pleasant and cooperative in NAD ?Head:  Normocephalic and atraumatic. ?Neck:  Supple; no masses or thyromegaly. ?Lungs:  Clear throughout to auscultation.    ?Heart:  Regular rate and rhythm. ?Abdomen:  Soft, nontender and nondistended. Normal bowel sounds, without guarding, and without rebound.   ?Neurologic:  Alert and  oriented x4;  grossly normal neurologically. ? ?Impression/Plan: ?Sarah Gordon is here for an endoscopy and colonoscopy to be performed for chronic symptoms of epigastric pain, lower  abdominal pain, abdominal bloating as well as loose frequent nonbloody bowel movements ? ?Risks, benefits, limitations, and alternatives regarding  endoscopy and colonoscopy have been reviewed with the patient.  Sander Nephew

## 2021-06-18 NOTE — Transfer of Care (Signed)
Immediate Anesthesia Transfer of Care Note ? ?Patient: Sarah Gordon ? ?Procedure(s) Performed: COLONOSCOPY WITH PROPOFOL ?ESOPHAGOGASTRODUODENOSCOPY (EGD) WITH PROPOFOL ? ?Patient Location: Endoscopy Unit ? ?Anesthesia Type:General ? ?Level of Consciousness: drowsy ? ?Airway & Oxygen Therapy: Patient Spontanous Breathing ? ?Post-op Assessment: Report given to RN and Post -op Vital signs reviewed and stable ? ?Post vital signs: Reviewed and stable ? ?Last Vitals:  ?Vitals Value Taken Time  ?BP    ?Temp    ?Pulse 72 06/18/21 0918  ?Resp 11 06/18/21 0918  ?SpO2 96 % 06/18/21 0918  ?Vitals shown include unvalidated device data. ? ?Last Pain:  ?Vitals:  ? 06/18/21 0802  ?TempSrc: Temporal  ?PainSc: 1   ?   ? ?  ? ?Complications: No notable events documented. ?

## 2021-06-18 NOTE — Op Note (Signed)
[facility] ?Gastroenterology ?Patient Name: Sarah Gordon ?Procedure Date: 06/18/2021 8:35 AM ?MRN: [id number] ?Account #: [id number] ?Date of Birth: [birth year] ?Admit Type: Outpatient ?Age: 71 ?Room: [facility] ENDO ROOM 4 ?Gender: Female ?Note Status: Finalized ?Instrument Name: Upper Endoscope [id number] ?Procedure:             Upper GI endoscopy ?Indications:           Epigastric abdominal pain, Abdominal bloating, Diarrhea ?Providers:             Toney Reil MD, MD ?Referring MD:          Bess Kinds (Referring MD) ?Medicines:             General Anesthesia ?Complications:         No immediate complications. Estimated blood loss: None. ?Procedure:             Pre-Anesthesia Assessment: ?                       - Prior to the procedure, a History and Physical was  ?                       performed, and patient medications and allergies were  ?                       reviewed. The patient is competent. The risks and  ?                       benefits of the procedure and the sedation options and  ?                       risks were discussed with the patient. All questions  ?                       were answered and informed consent was obtained.  ?                       Patient identification and proposed procedure were  ?                       verified by the physician, the nurse, the  ?                       anesthesiologist, the anesthetist and the technician  ?                       in the pre-procedure area in the procedure room in the  ?                       endoscopy suite. Mental Status Examination: alert and  ?                       oriented. Airway Examination: normal oropharyngeal  ?                       airway and neck mobility. Respiratory Examination:  ?                       clear to auscultation. CV Examination: normal.  ?  Prophylactic Antibiotics: The patient does not require  ?                       prophylactic antibiotics. Prior Anticoagulants: The  ?                        patient has taken no previous anticoagulant or  ?                       antiplatelet agents. ASA Grade Assessment: III - A  ?                       patient with severe systemic disease. After reviewing  ?                       the risks and benefits, the patient was deemed in  ?                       satisfactory condition to undergo the procedure. The  ?                       anesthesia plan was to use general anesthesia.  ?                       Immediately prior to administration of medications,  ?                       the patient was re-assessed for adequacy to receive  ?                       sedatives. The heart rate, respiratory rate, oxygen  ?                       saturations, blood pressure, adequacy of pulmonary  ?                       ventilation, and response to care were monitored  ?                       throughout the procedure. The physical status of the  ?                       patient was re-assessed after the procedure. ?                       After obtaining informed consent, the endoscope was  ?                       passed under direct vision. Throughout the procedure,  ?                       the patient's blood pressure, pulse, and oxygen  ?                       saturations were monitored continuously. The Endoscope  ?                       was introduced through the mouth, and advanced to the  ?  second part of duodenum. The upper GI endoscopy was  ?                       accomplished without difficulty. The patient tolerated  ?                       the procedure well. ?Findings: ?     The duodenal bulb and second portion of the duodenum were normal.  ?     Biopsies were taken with a cold forceps for histology. ?     The entire examined stomach was normal. Biopsies were taken with a cold  ?     forceps for histology. ?     The cardia and gastric fundus were normal on retroflexion. ?     The gastroesophageal junction and examined esophagus were  normal. ?Impression:            - Normal duodenal bulb and second portion of the  ?                       duodenum. Biopsied. ?                       - Normal stomach. Biopsied. ?                       - Normal gastroesophageal junction and esophagus. ?Recommendation:        - Await pathology results. ?                       - Continue present medications. ?                       - Proceed with colonoscopy as scheduled ?                       See colonoscopy report ?Procedure Code(s):     --- Professional --- ?                       [phone], Esophagogastroduodenoscopy, flexible,  ?                       transoral; with biopsy, single or multiple ?Diagnosis Code(s):     --- Professional --- ?                       R10.13, Epigastric pain ?                       R14.0, Abdominal distension (gaseous) ?                       R19.7, Diarrhea, unspecified ?CPT copyright 2019 American Medical Association. All rights reserved. ?The codes documented in this report are preliminary and upon coder review may  ?be revised to meet current compliance requirements. ?Dr. Libby Maw ?Akyra Bouchie Alger Memos MD, MD ?06/18/2021 8:51:16 AM ?This report has been signed electronically. ?Number of Addenda: 0 ?Note Initiated On: 06/18/2021 8:35 AM ?Estimated Blood Loss:  Estimated blood loss: none. ?     [facility] ?

## 2021-06-18 NOTE — Op Note (Signed)
[facility] ?Gastroenterology ?Patient Name: Sarah Gordon ?Procedure Date: 06/18/2021 8:34 AM ?MRN: [id number] ?Account #: [id number] ?Date of Birth: 02-Aug-1950 ?Admit Type: Outpatient ?Age: 71 ?Room: [facility] ENDO ROOM 4 ?Gender: Female ?Note Status: Finalized ?Instrument Name: Colonoscope [id number] ?Procedure:             Colonoscopy ?Indications:           Clinically significant diarrhea of unexplained origin ?Providers:             Toney Reil MD, MD ?Referring MD:          Onnie Boer. Carlynn Purl, MD (Referring MD) ?Medicines:             General Anesthesia ?Complications:         No immediate complications. Estimated blood loss: None. ?Procedure:             Pre-Anesthesia Assessment: ?                       - Prior to the procedure, a History and Physical was  ?                       performed, and patient medications and allergies were  ?                       reviewed. The patient is competent. The risks and  ?                       benefits of the procedure and the sedation options and  ?                       risks were discussed with the patient. All questions  ?                       were answered and informed consent was obtained.  ?                       Patient identification and proposed procedure were  ?                       verified by the physician, the nurse, the  ?                       anesthesiologist, the anesthetist and the technician  ?                       in the pre-procedure area in the procedure room in the  ?                       endoscopy suite. Mental Status Examination: alert and  ?                       oriented. Airway Examination: normal oropharyngeal  ?                       airway and neck mobility. Respiratory Examination:  ?                       clear to auscultation. CV Examination: normal.  ?  Prophylactic Antibiotics: The patient does not require  ?                       prophylactic antibiotics. Prior Anticoagulants: The  ?                        patient has taken no previous anticoagulant or  ?                       antiplatelet agents. ASA Grade Assessment: III - A  ?                       patient with severe systemic disease. After reviewing  ?                       the risks and benefits, the patient was deemed in  ?                       satisfactory condition to undergo the procedure. The  ?                       anesthesia plan was to use general anesthesia.  ?                       Immediately prior to administration of medications,  ?                       the patient was re-assessed for adequacy to receive  ?                       sedatives. The heart rate, respiratory rate, oxygen  ?                       saturations, blood pressure, adequacy of pulmonary  ?                       ventilation, and response to care were monitored  ?                       throughout the procedure. The physical status of the  ?                       patient was re-assessed after the procedure. ?                       After obtaining informed consent, the colonoscope was  ?                       passed under direct vision. Throughout the procedure,  ?                       the patient's blood pressure, pulse, and oxygen  ?                       saturations were monitored continuously. The  ?                       Colonoscope was introduced through the anus and  ?  advanced to the the terminal ileum, with  ?                       identification of the appendiceal orifice and IC  ?                       valve. The colonoscopy was performed without  ?                       difficulty. The patient tolerated the procedure well.  ?                       The quality of the bowel preparation was evaluated  ?                       using the BBPS [facility] Bowel Preparation Scale) with  ?                       scores of: Right Colon = 3, Transverse Colon = 3 and  ?                       Left Colon = 3 (entire mucosa seen well with no  ?                        residual staining, small fragments of stool or opaque  ?                       liquid). The total BBPS score equals 9. ?Findings: ?     The perianal and digital rectal examinations were normal. Pertinent  ?     negatives include normal sphincter tone and no palpable rectal lesions. ?     The terminal ileum appeared normal. ?     Two sessile polyps were found in the descending colon and ascending  ?     colon. The polyps were 5 to 6 mm in size. These polyps were removed with  ?     a cold snare. Resection and retrieval were complete. ?     Normal mucosa was found in the entire colon. Biopsies were taken with a  ?     cold forceps for histology. To prevent bleeding after the biopsy in  ?     cecum, one hemostatic clip was successfully placed (MR conditional).  ?     Bleeding had stopped at the end of the procedure. ?     The retroflexed view of the distal rectum and anal verge was normal and  ?     showed no anal or rectal abnormalities. ?Impression:            - The examined portion of the ileum was normal. ?                       - Two 5 to 6 mm polyps in the descending colon and in  ?                       the ascending colon, removed with a cold snare.  ?                       Resected and retrieved. ?                       -  Normal mucosa in the entire examined colon.  ?                       Biopsied. Clip (MR conditional) was placed. ?                       - The distal rectum and anal verge are normal on  ?                       retroflexion view. ?Recommendation:        - Discharge patient to home (with escort). ?                       - Resume previous diet today. ?                       - Continue present medications. ?                       - Await pathology results. ?                       - Repeat colonoscopy in 5 years for surveillance. ?Procedure Code(s):     --- Professional --- ?                       [id number], Colonoscopy, flexible; with removal of  ?                       tumor(s), polyp(s),  or other lesion(s) by snare  ?                       technique ?                       45380, 59, Colonoscopy, flexible; with biopsy, single  ?                       or multiple ?Diagnosis Code(s):     --- Professional --- ?                       K63.5, Polyp of colon ?                       R19.7, Diarrhea, unspecified ?CPT copyright 2019 American Medical Association. All rights reserved. ?The codes documented in this report are preliminary and upon coder review may  ?be revised to meet current compliance requirements. ?Dr. Libby Maw ?Stalin Gruenberg Alger Memos MD, MD ?06/18/2021 9:14:47 AM ?This report has been signed electronically. ?Number of Addenda: 0 ?Note Initiated On: 06/18/2021 8:34 AM ?Scope Withdrawal Time: 0 hours 12 minutes 42 seconds  ?Total Procedure Duration: 0 hours 18 minutes 45 seconds  ?Estimated Blood Loss:  Estimated blood loss: none. ?     [facility] ?

## 2021-06-18 NOTE — Anesthesia Preprocedure Evaluation (Signed)
Anesthesia Evaluation  ?Patient identified by MRN, date of birth, ID band ?Patient awake ? ? ? ?Reviewed: ?Allergy & Precautions, NPO status , Patient's Chart, lab work & pertinent test results ? ?Airway ?Mallampati: III ? ?TM Distance: >3 FB ?Neck ROM: Full ? ? ? Dental ? ?(+) Teeth Intact ?  ?Pulmonary ?neg pulmonary ROS, asthma , sleep apnea ,  ?  ?Pulmonary exam normal ? ?+ decreased breath sounds ? ? ? ? ? Cardiovascular ?Exercise Tolerance: Good ?hypertension, Pt. on medications ?negative cardio ROS ?Normal cardiovascular exam ?Rhythm:Regular  ? ?  ?Neuro/Psych ? Headaches, Anxiety Depression negative neurological ROS ? negative psych ROS  ? GI/Hepatic ?negative GI ROS, Neg liver ROS, GERD  ,  ?Endo/Other  ?negative endocrine ROSdiabetes, Well Controlled, Type 2Hypothyroidism Morbid obesity ? Renal/GU ?negative Renal ROS  ?negative genitourinary ?  ?Musculoskeletal ? ?(+) Arthritis ,  ? Abdominal ?(+) + obese,   ?Peds ?negative pediatric ROS ?(+)  Hematology ?negative hematology ROS ?(+)   ?Anesthesia Other Findings ?Past Medical History: ?No date: Allergy ??95: Anxiety ?No date: Asthma ?No date: Cataract ??95: Depression ?No date: Esophageal reflux ?No date: Gouty arthropathy, unspecified ?No date: Herpes simplex without mention of complication ?No date: Irritable bowel syndrome ?No date: Localized osteoarthrosis not specified whether primary or  ?secondary, unspecified site ?No date: Migraine, unspecified, without mention of intractable  ?migraine without mention of status migrainosus ?No date: Mixed hyperlipidemia ?No date: Myalgia and myositis, unspecified ?No date: Obstructive sleep apnea (adult) (pediatric) ?No date: Palpitations ?No date: Sleep apnea ?No date: Type II or unspecified type diabetes mellitus without  ?mention of complication, not stated as uncontrolled ?No date: Umbilical hernia without mention of obstruction or gangrene ?No date: Unspecified essential  hypertension ?No date: Unspecified hypothyroidism ? ?Past Surgical History: ?No date: BREAST BIOPSY; Left ?    Comment:  benign ?No date: COLONOSCOPY ?    Comment:  Dr Alveta Heimlich ?11/19/2015: COLONOSCOPY WITH PROPOFOL; N/A ?    Comment:  Procedure: COLONOSCOPY WITH PROPOFOL;  Surgeon:  ?             Seeplaputhur Robinette Haines, MD;  Location: [facility] ENDOSCOPY;   ?             Service: Endoscopy;  Laterality: N/A; ?08/20/2014: ESOPHAGOGASTRODUODENOSCOPY; N/A ?    Comment:  Procedure: ESOPHAGOGASTRODUODENOSCOPY (EGD);  Surgeon:  ?             Seeplaputhur Robinette Haines, MD;  Location: [facility] ENDOSCOPY;   ?             Service: Endoscopy;  Laterality: N/A; ?No date: FOOT SURGERY; Left ?    Comment:  heel spur ?No date: HERNIA REPAIR ?    Comment:  umbilical ?[id number]: NASAL SINUS SURGERY ?No date: UMBILICAL HERNIA REPAIR ? ? ? ? Reproductive/Obstetrics ?negative OB ROS ? ?  ? ? ? ? ? ? ? ? ? ? ? ? ? ?  ?  ? ? ? ? ? ? ? ? ?Anesthesia Physical ?Anesthesia Plan ? ?ASA: 3 ? ?Anesthesia Plan: General  ? ?Post-op Pain Management:   ? ?Induction: Intravenous ? ?PONV Risk Score and Plan: Propofol infusion and TIVA ? ?Airway Management Planned: Natural Airway and Nasal Cannula ? ?Additional Equipment:  ? ?Intra-op Plan:  ? ?Post-operative Plan:  ? ?Informed Consent: I have reviewed the patients History and Physical, chart, labs and discussed the procedure including the risks, benefits and alternatives for the proposed anesthesia with the patient or authorized representative who has indicated  his/her understanding and acceptance.  ? ? ? ?Dental Advisory Given ? ?Plan Discussed with: CRNA and Surgeon ? ?Anesthesia Plan Comments:   ? ? ? ? ? ? ?Anesthesia Quick Evaluation ? ?

## 2021-06-19 ENCOUNTER — Encounter: Payer: Self-pay | Admitting: Gastroenterology

## 2021-06-19 LAB — SURGICAL PATHOLOGY

## 2021-06-22 ENCOUNTER — Telehealth: Payer: Self-pay

## 2021-06-22 MED ORDER — RIFAXIMIN 550 MG PO TABS: 550.0000 mg | ORAL_TABLET | Freq: Three times a day (TID) | ORAL | 0 refills | Status: AC

## 2021-06-22 MED ORDER — DOXYCYCLINE HYCLATE 100 MG PO CAPS: 100.0000 mg | ORAL_CAPSULE | Freq: Two times a day (BID) | ORAL | 0 refills | Status: DC

## 2021-06-22 NOTE — Telephone Encounter (Signed)
This approval authorizes your coverage from 03/22/2021 - 07/06/2021, unless we notify you  ?otherwise, and as long as the following conditions apply: ? ?

## 2021-06-22 NOTE — Telephone Encounter (Signed)
Did PA for Xifaxan through cover my meds. Waiting on response from insurance   ?

## 2021-06-22 NOTE — Telephone Encounter (Signed)
Bx of duodenum and stomach normal. Polyps are adenomas

## 2021-06-22 NOTE — Telephone Encounter (Signed)
This is what Dr. Marius Ditch put in the path results Ms. Debbie ?  ?The pathology results came back normal except for the polyps which were benign.  I recommend to empirically treat with 2 weeks course of antibiotic for possible bacterial overgrowth which can lead to the symptoms that you are experiencing ?  ?I will be out of town next week.  Please touch base with my nurse, Caryl Pina if you need anything ?  ?Rohini Vanga ?

## 2021-06-22 NOTE — Telephone Encounter (Signed)
-----   Message from Lin Landsman, MD sent at 06/19/2021 12:25 PM EDT ----- ?Recommend 2 weeks course of rifaximin 550 mg 3 times daily for IBS diarrhea ?Pathology results came back normal ? ?RV ?

## 2021-06-22 NOTE — Telephone Encounter (Signed)
Per Dr. Vicente Males he said that since she has a 100 dollar copay to send in Doxycyline '100mg'$  twice a day for 7 days. He states if she is not feeling 100 percent better to give [place] a call back and we can call her in another 7 days. Informed patient and she verbalized understanding   ?

## 2021-06-22 NOTE — Telephone Encounter (Signed)
Sent medication to the pharmacy. Informed patient by mychart  

## 2021-06-24 ENCOUNTER — Telehealth: Payer: Self-pay | Admitting: Gastroenterology

## 2021-06-24 NOTE — Telephone Encounter (Signed)
Patient is having lower abdominal pain pain under breast pain. Patient states the pain gets worse when eating. She has been very bloated. She states she has not been able to wear pants because it makes the pressure worse. Patient has had diarrhea. Today has not had diarrhea but yesterday 5 or 6 times. On Tuesday had to wake up in the middle of the night to make it to the bathroom. States they day after her procedure she took her medications and then had diarrhea. When she got done using the bathroom she vomited her medication. Has been on doxycyline for 3 days now  ?

## 2021-06-24 NOTE — Telephone Encounter (Signed)
Pt had procedure on 3/30 and stated that she is in constant pain since procedure. States that she is in pain from under her breast bone to the bottom of her stomach and constant bloating. ?

## 2021-06-25 NOTE — Telephone Encounter (Signed)
Patient called back and she verbalized understanding of instructions. She is taking the omeprazole.  ? ?

## 2021-06-25 NOTE — Telephone Encounter (Addendum)
Called and left a message for call back and sent LOW FODMAP diet instructions to mychart  ?

## 2021-06-26 NOTE — Anesthesia Postprocedure Evaluation (Signed)
Anesthesia Post Note ? ?Patient: Sarah Gordon ? ?Procedure(s) Performed: COLONOSCOPY WITH PROPOFOL ?ESOPHAGOGASTRODUODENOSCOPY (EGD) WITH PROPOFOL ? ?Patient location during evaluation: PACU ?Anesthesia Type: General ?Level of consciousness: awake and oriented ?Pain management: satisfactory to patient ?Vital Signs Assessment: post-procedure vital signs reviewed and stable ?Respiratory status: spontaneous breathing and nonlabored ventilation ?Cardiovascular status: stable ?Anesthetic complications: no ? ? ?No notable events documented. ? ? ?Last Vitals:  ?Vitals:  ? 06/18/21 0938 06/18/21 0948  ?BP: 115/77 113/79  ?Pulse: 68   ?Resp: 13 (!) 22  ?Temp:    ?SpO2: 100%   ?  ?Last Pain:  ?Vitals:  ? 06/19/21 0732  ?TempSrc:   ?PainSc: 0-No pain  ? ? ?  ?  ?  ?  ?  ?  ? ?VAN STAVEREN,[name] ? ? ? ? ?

## 2021-06-29 ENCOUNTER — Other Ambulatory Visit: Payer: Self-pay | Admitting: Gastroenterology

## 2021-07-05 ENCOUNTER — Encounter: Payer: Self-pay | Admitting: Emergency Medicine

## 2021-07-05 ENCOUNTER — Ambulatory Visit
Admission: EM | Admit: 2021-07-05 | Discharge: 2021-07-05 | Disposition: A | Discharge status: Home / Self Care | Payer: Medicare HMO | Attending: Family Medicine | Admitting: Family Medicine

## 2021-07-05 DIAGNOSIS — L249 Irritant contact dermatitis, unspecified cause: Secondary | ICD-10-CM | POA: Diagnosis not present

## 2021-07-05 MED ORDER — TRIAMCINOLONE ACETONIDE 0.025 % EX CREA: 1.0000 "application " | TOPICAL_CREAM | Freq: Two times a day (BID) | CUTANEOUS | 0 refills | Status: DC

## 2021-07-05 NOTE — ED Triage Notes (Signed)
Pt presents with a rash on her right arm that she noticed yesterday. She was scratched by her daughters dog 4 days ago.  ?

## 2021-07-05 NOTE — ED Provider Notes (Signed)
Sarah Gordon    CSN: [id number] Arrival date & time: 07/05/21  1405      History   Chief Complaint Chief Complaint  Patient presents with   Rash    HPI Sarah Gordon is a 71 y.o. female.   HPI Patient reports being scratched by her daughters dog x 4 days ago. The dog is up-to-date on his immunizations.  She sustained a small puncture wound which is subsequently healed.  However over the last few days she has developed a pruritic rash which is surrounding her right forearm.  She reports the rash is pruritic.  She has no other rash on any other part of her body however she is concerned as the rash appears to be spreading on her right arm.  She has had no fever, nausea or vomiting.  Past Medical History:  Diagnosis Date   Allergy    Anxiety '95   Asthma    Cataract    Depression '95   Esophageal reflux    Gouty arthropathy, unspecified    Herpes simplex without mention of complication    Irritable bowel syndrome    Localized osteoarthrosis not specified whether primary or secondary, unspecified site    Migraine, unspecified, without mention of intractable migraine without mention of status migrainosus    Mixed hyperlipidemia    Myalgia and myositis, unspecified    Obstructive sleep apnea (adult) (pediatric)    Palpitations    Sleep apnea    Type II or unspecified type diabetes mellitus without mention of complication, not stated as uncontrolled    Umbilical hernia without mention of obstruction or gangrene    Unspecified essential hypertension    Unspecified hypothyroidism     Patient Active Problem List   Diagnosis Date Noted   Epigastric pain    Polyp of colon    Lower abdominal pain    Abdominal bloating    Chronic diarrhea    Lumbar radiculopathy 11/06/2020   Ventricular flutter (HCC) 12/06/2018   MDD (major depressive disorder), recurrent episode, mild (HCC) 09/04/2018   DDD (degenerative disc disease), cervical 01/23/2016   Degenerative joint  disease of left acromioclavicular joint 01/23/2016   Recurrent oral herpes simplex infection 09/01/2015   Irritable bowel syndrome with diarrhea 08/07/2015   Statin intolerance 02/28/2015   Asthma, intermittent 09/10/2014   Controlled type 2 diabetes with neuropathy (HCC) 09/10/2014   Fibromyalgia 09/10/2014   Obstructive sleep apnea 09/10/2014   Hyperlipidemia 12/29/2012   Essential hypertension 12/29/2012   Morbid obesity (HCC) 12/29/2012   Arthritis due to gout 12/13/2007   Acid reflux 10/24/2006   Adult hypothyroidism 10/24/2006   Localized osteoarthrosis 10/24/2006    Past Surgical History:  Procedure Laterality Date   BREAST BIOPSY Left    benign   COLONOSCOPY     Dr Thurmond Butts   COLONOSCOPY WITH PROPOFOL N/A 11/19/2015   Procedure: COLONOSCOPY WITH PROPOFOL;  Surgeon: Kieth Brightly, MD;  Location: [facility] ENDOSCOPY;  Service: Endoscopy;  Laterality: N/A;   COLONOSCOPY WITH PROPOFOL N/A 06/18/2021   Procedure: COLONOSCOPY WITH PROPOFOL;  Surgeon: Toney Reil, MD;  Location: [facility] ENDOSCOPY;  Service: Gastroenterology;  Laterality: N/A;   ESOPHAGOGASTRODUODENOSCOPY N/A 08/20/2014   Procedure: ESOPHAGOGASTRODUODENOSCOPY (EGD);  Surgeon: Kieth Brightly, MD;  Location: [facility] ENDOSCOPY;  Service: Endoscopy;  Laterality: N/A;   ESOPHAGOGASTRODUODENOSCOPY (EGD) WITH PROPOFOL N/A 06/18/2021   Procedure: ESOPHAGOGASTRODUODENOSCOPY (EGD) WITH PROPOFOL;  Surgeon: Toney Reil, MD;  Location: [facility] ENDOSCOPY;  Service: Gastroenterology;  Laterality: N/A;  FOOT SURGERY Left    heel spur   HERNIA REPAIR     umbilical   NASAL SINUS SURGERY  1980s   UMBILICAL HERNIA REPAIR      OB History     Gravida  1   Para  1   Term  1   Preterm      AB      Living  1      SAB      IAB      Ectopic      Multiple      Live Births           Obstetric Comments  Menstrual age: 54  Age 1st Pregnancy: 22           Home Medications    Prior to  Admission medications   Medication Sig Start Date End Date Taking? Authorizing Provider  triamcinolone (KENALOG) 0.025 % cream Apply 1 application. topically 2 (two) times daily. 07/05/21  Yes Bing Neighbors, FNP  acetaminophen (TYLENOL) 650 MG CR tablet Take 1,300 mg by mouth 2 (two) times daily.    [provider]  albuterol (VENTOLIN HFA) 108 (90 Base) MCG/ACT inhaler Inhale 2 puffs into the lungs every 6 (six) hours as needed for wheezing or shortness of breath. 05/23/20   Alba Cory, MD  doxycycline (VIBRAMYCIN) 100 MG capsule TAKE ONE CAPSULE BY MOUTH TWICE A DAY 06/29/21   Vanga, Loel Dubonnet, MD  DULoxetine (CYMBALTA) 60 MG capsule Take 1 capsule (60 mg total) by mouth daily. 03/31/21   Alba Cory, MD  ezetimibe (ZETIA) 10 MG tablet Take 1 tablet (10 mg total) by mouth daily. 03/31/21   Alba Cory, MD  fluticasone (FLOVENT HFA) 44 MCG/ACT inhaler Inhale 1 puff into the lungs in the morning and at bedtime. 01/15/21   Margaretann Loveless, PA-C  hydrOXYzine (ATARAX/VISTARIL) 10 MG tablet Take 1 tablet (10 mg total) by mouth 3 (three) times daily as needed. Patient not taking: Reported on 05/28/2021 05/23/20   Alba Cory, MD  levothyroxine (SYNTHROID) 100 MCG tablet TAKE 1 TABLET(100 MCG) BY MOUTH DAILY 11/25/20   Alba Cory, MD  metoprolol succinate (TOPROL-XL) 25 MG 24 hr tablet TAKE 1 TABLET(25 MG) BY MOUTH DAILY 03/31/21   Carlynn Purl, Danna Hefty, MD  montelukast (SINGULAIR) 10 MG tablet Take 1 tablet (10 mg total) by mouth at bedtime. 03/31/21   Alba Cory, MD  omeprazole (PRILOSEC) 40 MG capsule Take 1 capsule (40 mg total) by mouth 2 (two) times daily before a meal. 04/21/21   Vanga, Loel Dubonnet, MD  PRALUENT 75 MG/ML SOAJ INJECT 75MG  INTO THE SKIN EVERY 14 DAYS 06/15/21   Alba Cory, MD  traMADol Janean Sark) 50 MG tablet Take by mouth. 05/12/21   [provider]  valsartan (DIOVAN) 160 MG tablet Take 1 tablet (160 mg total) by mouth daily. 03/31/21   Alba Cory, MD  Fluticasone-Salmeterol (ADVAIR DISKUS) 250-50 MCG/DOSE AEPB Inhale 1 puff into the lungs 2 (two) times daily. 04/11/19 07/22/19  Alba Cory, MD    Family History Family History  Problem Relation Age of Onset   Hypertension Mother    Hyperlipidemia Mother    Diabetes Mother    Heart disease Mother    Stroke Mother    Varicose Veins Mother    Heart disease Brother        CABG x 3    Heart attack Brother 43   Hyperlipidemia Brother    Hypertension Brother    Lupus  Sister    Arthritis Sister    Depression Sister    Obesity Sister    Asthma Sister    Depression Sister    Hypertension Sister    Miscarriages / Stillbirths Sister    Varicose Veins Sister    Depression Sister    Arthritis Sister    Heart disease Sister    Hyperlipidemia Sister    Hypertension Sister    Miscarriages / Stillbirths Sister    Varicose Veins Sister    Diabetes Brother    Hypertension Brother    Obesity Brother    Asthma Brother    Depression Brother    Hyperlipidemia Brother    Early death Brother    Miscarriages / Stillbirths Sister    Arthritis Sister    Depression Sister    Heart disease Sister    Hyperlipidemia Sister    Stroke Maternal Grandfather    Arthritis Brother    Depression Brother    Hyperlipidemia Brother    Hypertension Brother    Breast cancer Neg Hx     Social History Social History   Tobacco Use   Smoking status: Never   Smokeless tobacco: Never  Vaping Use   Vaping Use: Never used  Substance Use Topics   Alcohol use: No    Comment: RARELY   Drug use: No     Allergies   Vascepa [icosapent ethyl], Aspir-81 [aspirin], Gabapentin, Statins, Topamax [topiramate], Voltaren [diclofenac sodium], and Lipitor [atorvastatin]   Review of Systems Review of Systems Pertinent negatives listed in HPI  Physical Exam Triage Vital Signs ED Triage Vitals  Enc Vitals Group     BP 07/05/21 1443 125/77     Pulse Rate 07/05/21 1443 69     Resp 07/05/21  1443 16     Temp 07/05/21 1443 98.1 F (36.7 C)     Temp Source 07/05/21 1443 Oral     SpO2 07/05/21 1443 95 %     Weight --      Height --      Head Circumference --      Peak Flow --      Pain Score 07/05/21 1449 3     Pain Loc --      Pain Edu? --      Excl. in GC? --    No data found.  Updated Vital Signs BP 125/77 (BP Location: Left Arm)   Pulse 69   Temp 98.1 F (36.7 C) (Oral)   Resp 16   SpO2 95%   Visual Acuity Right Eye Distance:   Left Eye Distance:   Bilateral Distance:    Right Eye Near:   Left Eye Near:    Bilateral Near:     Physical Exam Vitals reviewed.  Constitutional:      Appearance: Normal appearance.  HENT:     Head: Normocephalic and atraumatic.  Eyes:     Extraocular Movements: Extraocular movements intact.     Pupils: Pupils are equal, round, and reactive to light.  Cardiovascular:     Rate and Rhythm: Normal rate.  Skin:    General: Skin is warm.     Capillary Refill: Capillary refill takes less than 2 seconds.       Neurological:     General: No focal deficit present.     Mental Status: She is alert and oriented to person, place, and time.  Psychiatric:        Mood and Affect: Mood normal.  Behavior: Behavior normal.        Thought Content: Thought content normal.        Judgment: Judgment normal.     UC Treatments / Results  Labs (all labs ordered are listed, but only abnormal results are displayed) Labs Reviewed - No data to display  EKG   Radiology No results found.  Procedures Procedures (including critical care time)  Medications Ordered in UC Medications - No data to display  Initial Impression / Assessment and Plan / UC Course  I have reviewed the triage vital signs and the nursing notes.  Pertinent labs & imaging results that were available during my care of the patient were reviewed by me and considered in my medical decision making (see chart for details).    Irritant contact dermatitis  recommend applications of triamcinolone cream twice daily as needed until rash resolves.  Also for itching you can take Benadryl or cetirizine.  There is no evidence of infection therefore no antibiotics were prescribed.  Patient advised to monitor site of rash worsens or any redness develops on her arm to return for evaluation. Final Clinical Impressions(s) / UC Diagnoses   Final diagnoses:  Irritant contact dermatitis, unspecified trigger   Discharge Instructions   None    ED Prescriptions     Medication Sig Dispense Auth. Provider   triamcinolone (KENALOG) 0.025 % cream Apply 1 application. topically 2 (two) times daily. 454 g Bing Neighbors, FNP      PDMP not reviewed this encounter.   Bing Neighbors, [place] 07/09/21 [id number]

## 2021-07-25 ENCOUNTER — Other Ambulatory Visit: Payer: Self-pay | Admitting: Gastroenterology

## 2021-08-05 DIAGNOSIS — J020 Streptococcal pharyngitis: Secondary | ICD-10-CM | POA: Diagnosis not present

## 2021-08-05 DIAGNOSIS — R070 Pain in throat: Secondary | ICD-10-CM | POA: Diagnosis not present

## 2021-10-01 ENCOUNTER — Other Ambulatory Visit: Payer: Self-pay

## 2021-10-05 ENCOUNTER — Ambulatory Visit: Payer: Medicare HMO | Admitting: Gastroenterology

## 2021-10-26 ENCOUNTER — Other Ambulatory Visit: Payer: Self-pay

## 2021-10-27 ENCOUNTER — Ambulatory Visit (INDEPENDENT_AMBULATORY_CARE_PROVIDER_SITE_OTHER): Payer: Medicare HMO | Admitting: Family Medicine

## 2021-10-27 ENCOUNTER — Encounter: Payer: Self-pay | Admitting: Family Medicine

## 2021-10-27 VITALS — BP 128/80 | HR 98 | Resp 16 | Ht 66.0 in | Wt 265.0 lb

## 2021-10-27 DIAGNOSIS — F3340 Major depressive disorder, recurrent, in remission, unspecified: Secondary | ICD-10-CM | POA: Diagnosis not present

## 2021-10-27 DIAGNOSIS — I10 Essential (primary) hypertension: Secondary | ICD-10-CM

## 2021-10-27 DIAGNOSIS — J4520 Mild intermittent asthma, uncomplicated: Secondary | ICD-10-CM | POA: Diagnosis not present

## 2021-10-27 DIAGNOSIS — I4902 Ventricular flutter: Secondary | ICD-10-CM

## 2021-10-27 DIAGNOSIS — Z818 Family history of other mental and behavioral disorders: Secondary | ICD-10-CM

## 2021-10-27 DIAGNOSIS — G4733 Obstructive sleep apnea (adult) (pediatric): Secondary | ICD-10-CM

## 2021-10-27 DIAGNOSIS — K219 Gastro-esophageal reflux disease without esophagitis: Secondary | ICD-10-CM

## 2021-10-27 DIAGNOSIS — R419 Unspecified symptoms and signs involving cognitive functions and awareness: Secondary | ICD-10-CM | POA: Diagnosis not present

## 2021-10-27 DIAGNOSIS — E785 Hyperlipidemia, unspecified: Secondary | ICD-10-CM

## 2021-10-27 DIAGNOSIS — E039 Hypothyroidism, unspecified: Secondary | ICD-10-CM

## 2021-10-27 DIAGNOSIS — R69 Illness, unspecified: Secondary | ICD-10-CM | POA: Diagnosis not present

## 2021-10-27 DIAGNOSIS — E1169 Type 2 diabetes mellitus with other specified complication: Secondary | ICD-10-CM

## 2021-10-27 LAB — POCT GLYCOSYLATED HEMOGLOBIN (HGB A1C): Hemoglobin A1C: 5.7 % — AB (ref 4.0–5.6)

## 2021-10-27 MED ORDER — PRALUENT 75 MG/ML ~~LOC~~ SOAJ: SUBCUTANEOUS | 1 refills | Status: DC

## 2021-10-27 MED ORDER — LEVOTHYROXINE SODIUM 100 MCG PO TABS: ORAL_TABLET | ORAL | 1 refills | Status: DC

## 2021-10-27 MED ORDER — EZETIMIBE 10 MG PO TABS: 10.0000 mg | ORAL_TABLET | Freq: Every day | ORAL | 1 refills | Status: DC

## 2021-10-27 MED ORDER — DULOXETINE HCL 60 MG PO CPEP: 60.0000 mg | ORAL_CAPSULE | Freq: Every day | ORAL | 1 refills | Status: DC

## 2021-10-27 MED ORDER — QUETIAPINE FUMARATE 25 MG PO TABS: 25.0000 mg | ORAL_TABLET | Freq: Every day | ORAL | 0 refills | Status: DC

## 2021-10-27 MED ORDER — MONTELUKAST SODIUM 10 MG PO TABS: 10.0000 mg | ORAL_TABLET | Freq: Every day | ORAL | 1 refills | Status: DC

## 2021-10-27 NOTE — Progress Notes (Signed)
Name: Sarah Gordon   MRN: [id number]    DOB: 04-18-1950   Date:10/27/2021       Progress Note  Subjective  Chief Complaint  Medication Evaluation  HPI  HTN:he is compliant with medication, denies chest pain or palpitation. Since we added metoprolol heart rate has been at goal she is also on valsartan . History of ventricular flutter in 2020 but resolved since - seen by cardiologist BP at home has been at goal and also here today    Bloating: she developed diarrhea early Dec, she was seen we order PCR study but diarrhea resolved and it was done, when she came in for follow up in Feb she was still having  abdominal bloating and distention also episodes of indigestion, no nausea. She was referred to GI and had colonoscopy and EGD end of March that were unremarkable except for polyps . She was advised to take antibiotics for two weeks for possible infection. She is on a FODMAP diet and is doing well now Denies diarrhea    Ventricular flutter: seen by cardiologist, medication for thyroid adjusted and feeling well since and taking metoprolol . She states no recent episodes of palpitation .   MDD: she was  feeling better on duloxetine and denies side effects.  Before her last visit she was noticing that she was feeling  numb so we dropped dose to 30 mg but lower dose caused back pain to increase, so she is back on Duloxetine . She has been sad since she came back from [place] - she went to visit her family and realized that her brother has dementia, her sister also has dementia and is so hard to realize her siblings are getting older and frail. She is also worried because she has been repeating same questions and stories . She is worried about developing dementia. She has 4 siblings with dementia and one diagnosed with Alzheimer's dementia.    DMII: doing well on life style modification. A1C has been  at goal. Denies polyphagia, polydipsia or polyuria She has a history of neuropathy, very mild symptoms. She  is on Zetia for dyslipidemia, unable to tolerate statin therapy, and also on Praluent, we will recheck labs, last LDL was better down from 160 to 98    DDD lumbar spine: she had MRI done back in 2010 and showed herniated disc, she had steroid injections twice and it helped. However pain is back , she has been using a walker since it decreased the pain.  She staes the pain is worse on the right lower back, but can radiate both legs.  Pain right now is 4/10    OA: knees and feet, also has FMS, she states her legs are doing better, except for when radiates down her legs.  Unchanged    Asthma: She has occasional dry cough, SOB and wheezing a couple of times a month . She uses Flovent prn only    Morbid Obesity: BMI above 40, she states weight about the same.    Hypothyroidism: she is taking medication daily, denies hair loss, she has chronic dry skin and chronic constipation Last TSH is at goal and we will recheck it once a year    GERD: doing well on Omeprazole, seen by Dr. Marius Ditch   Patient Active Problem List   Diagnosis Date Noted   Polyp of colon    Lower abdominal pain    Lumbar radiculopathy 11/06/2020   Ventricular flutter ([place]) 12/06/2018   MDD (major depressive  disorder), recurrent episode, mild ([place]) 09/04/2018   DDD (degenerative disc disease), cervical 01/23/2016   Degenerative joint disease of left acromioclavicular joint 01/23/2016   Recurrent oral herpes simplex infection 09/01/2015   Irritable bowel syndrome with diarrhea 08/07/2015   Statin intolerance 02/28/2015   Asthma, intermittent 09/10/2014   Controlled type 2 diabetes with neuropathy ([place]) 09/10/2014   Fibromyalgia 09/10/2014   Obstructive sleep apnea 09/10/2014   Hyperlipidemia 12/29/2012   Essential hypertension 12/29/2012   Morbid obesity ([place]) 12/29/2012   Arthritis due to gout 12/13/2007   Acid reflux 10/24/2006   Adult hypothyroidism 10/24/2006   Localized osteoarthrosis 10/24/2006    Past Surgical  History:  Procedure Laterality Date   BREAST BIOPSY Left    benign   COLONOSCOPY     Dr Alveta Heimlich   COLONOSCOPY WITH PROPOFOL N/A 11/19/2015   Procedure: COLONOSCOPY WITH PROPOFOL;  Surgeon: Christene Lye, MD;  Location: [facility] ENDOSCOPY;  Service: Endoscopy;  Laterality: N/A;   COLONOSCOPY WITH PROPOFOL N/A 06/18/2021   Procedure: COLONOSCOPY WITH PROPOFOL;  Surgeon: Lin Landsman, MD;  Location: [facility] ENDOSCOPY;  Service: Gastroenterology;  Laterality: N/A;   ESOPHAGOGASTRODUODENOSCOPY N/A 08/20/2014   Procedure: ESOPHAGOGASTRODUODENOSCOPY (EGD);  Surgeon: Christene Lye, MD;  Location: [facility] ENDOSCOPY;  Service: Endoscopy;  Laterality: N/A;   ESOPHAGOGASTRODUODENOSCOPY (EGD) WITH PROPOFOL N/A 06/18/2021   Procedure: ESOPHAGOGASTRODUODENOSCOPY (EGD) WITH PROPOFOL;  Surgeon: Lin Landsman, MD;  Location: [facility] ENDOSCOPY;  Service: Gastroenterology;  Laterality: N/A;   FOOT SURGERY Left    heel spur   HERNIA REPAIR     umbilical   NASAL SINUS SURGERY  9242A   UMBILICAL HERNIA REPAIR      Family History  Problem Relation Age of Onset   Hypertension Mother    Hyperlipidemia Mother    Diabetes Mother    Heart disease Mother    Stroke Mother    Varicose Veins Mother    Heart disease Brother        CABG x 3    Heart attack Brother 48   Hyperlipidemia Brother    Hypertension Brother    Lupus Sister    Arthritis Sister    Depression Sister    Obesity Sister    Asthma Sister    Depression Sister    Hypertension Sister    79 / Stillbirths Sister    Varicose Veins Sister    Depression Sister    Arthritis Sister    Heart disease Sister    Hyperlipidemia Sister    Hypertension Sister    30 / Stillbirths Sister    Varicose Veins Sister    Diabetes Brother    Hypertension Brother    Obesity Brother    Asthma Brother    Depression Brother    Hyperlipidemia Brother    Early death Brother    30 / Stillbirths Sister    Arthritis  Sister    Depression Sister    Heart disease Sister    Hyperlipidemia Sister    Stroke Maternal Grandfather    Arthritis Brother    Depression Brother    Hyperlipidemia Brother    Hypertension Brother    Breast cancer Neg Hx     Social History   Tobacco Use   Smoking status: Never   Smokeless tobacco: Never  Substance Use Topics   Alcohol use: No    Comment: RARELY     Current Outpatient Medications:    acetaminophen (TYLENOL) 650 MG CR tablet, Take 1,300 mg by mouth 2 (two)  times daily., Disp: , Rfl:    albuterol (VENTOLIN HFA) 108 (90 Base) MCG/ACT inhaler, Inhale 2 puffs into the lungs every 6 (six) hours as needed for wheezing or shortness of breath., Disp: 1 each, Rfl: 2   fluticasone (FLOVENT HFA) 44 MCG/ACT inhaler, Inhale 1 puff into the lungs in the morning and at bedtime., Disp: 1 each, Rfl: 0   hydrOXYzine (ATARAX/VISTARIL) 10 MG tablet, Take 1 tablet (10 mg total) by mouth 3 (three) times daily as needed., Disp: 30 tablet, Rfl: 0   Metoprolol-Hydrochlorothiazide 25-12.5 MG TB24, , Disp: , Rfl:    omeprazole (PRILOSEC) 20 MG capsule, , Disp: , Rfl:    QUEtiapine (SEROQUEL) 25 MG tablet, Take 1 tablet (25 mg total) by mouth at bedtime., Disp: 90 tablet, Rfl: 0   traMADol (ULTRAM) 50 MG tablet, Take by mouth., Disp: , Rfl:    triamcinolone (KENALOG) 0.025 % cream, Apply 1 application. topically 2 (two) times daily., Disp: 454 g, Rfl: 0   valsartan-hydrochlorothiazide (DIOVAN-HCT) 160-25 MG tablet, , Disp: , Rfl:    Alirocumab (PRALUENT) 75 MG/ML SOAJ, INJECT '75MG'$  INTO THE SKIN EVERY 14 DAYS, Disp: 6 mL, Rfl: 1   DULoxetine (CYMBALTA) 60 MG capsule, Take 1 capsule (60 mg total) by mouth daily., Disp: 90 capsule, Rfl: 1   ezetimibe (ZETIA) 10 MG tablet, Take 1 tablet (10 mg total) by mouth daily., Disp: 90 tablet, Rfl: 1   levothyroxine (SYNTHROID) 100 MCG tablet, TAKE 1 TABLET(100 MCG) BY MOUTH DAILY, Disp: 90 tablet, Rfl: 1   montelukast (SINGULAIR) 10 MG tablet, Take 1  tablet (10 mg total) by mouth at bedtime., Disp: 90 tablet, Rfl: 1  Allergies  Allergen Reactions   Vascepa [Icosapent Ethyl] Hives and Swelling   Aspir-81 [Aspirin] Other (See Comments)    Coagulation disorder   Gabapentin Other (See Comments)    Cognitive/memory issues   Statins Other (See Comments)   Topamax [Topiramate] Other (See Comments)    Cognitive issues    Voltaren [Diclofenac Sodium] Hives and Other (See Comments)   Lipitor [Atorvastatin]     Joint pain    I personally reviewed active problem list, medication list, allergies, family history, social history, health maintenance with the patient/caregiver today.   ROS  Constitutional: Negative for fever or weight change.  Respiratory: Negative for cough and shortness of breath.   Cardiovascular: Negative for chest pain or palpitations.  Gastrointestinal: Negative for abdominal pain, no bowel changes.  Musculoskeletal: Negative for gait problem or joint swelling.  Skin: Negative for rash.  Neurological: Negative for dizziness or headache.  No other specific complaints in a complete review of systems (except as listed in HPI above).   Objective  Vitals:   10/27/21 0824  BP: 128/80  Pulse: 98  Resp: 16  SpO2: 98%  Weight: 265 lb (120.2 kg)  Height: '5\' 6"'$  (1.676 m)    Body mass index is 42.77 kg/m.  Physical Exam  Constitutional: Patient appears well-developed and well-nourished. Obese  No distress.  HEENT: head atraumatic, normocephalic, pupils equal and reactive to light, neck supple, throat within normal limits Cardiovascular: Normal rate, regular rhythm and normal heart sounds.  No murmur heard. No BLE edema. Pulmonary/Chest: Effort normal and breath sounds normal. No respiratory distress. Abdominal: Soft.  There is no tenderness. Psychiatric: Patient has a normal mood and affect. behavior is normal. Judgment and thought content normal.    PHQ2/9:    10/27/2021    8:23 AM 05/28/2021    8:09 AM  03/31/2021    8:09 AM 02/19/2021   11:34 AM 11/25/2020    9:13 AM  Depression screen PHQ 2/9  Decreased Interest 0 0 0 0 1  Down, Depressed, Hopeless 2 0 0 0 1  PHQ - 2 Score 2 0 0 0 2  Altered sleeping 2 2 0 0 1  Tired, decreased energy 2 1 0 0 2  Change in appetite 0 0 0 0 0  Feeling bad or failure about yourself  1 0 0 0 0  Trouble concentrating 0 0 0 0 0  Moving slowly or fidgety/restless 0 0 0 0 0  Suicidal thoughts 0 0 0 0 0  PHQ-9 Score 7 3 0 0 5    phq 9 is positive   Fall Risk:    10/27/2021    8:23 AM 05/28/2021    8:12 AM 03/31/2021    8:09 AM 02/19/2021   11:34 AM 11/25/2020    9:13 AM  Fall Risk   Falls in the past year? 0 '1 1 1 1  '$ Number falls in past yr: 0 '1 1 1 1  '$ Injury with Fall? 0 '1 1 1 1  '$ Risk for fall due to : No Fall Risks History of fall(s) No Fall Risks No Fall Risks Impaired balance/gait  Follow up Falls prevention discussed Falls prevention discussed Falls prevention discussed Falls prevention discussed Falls prevention discussed      Functional Status Survey: Is the patient deaf or have difficulty hearing?: No Does the patient have difficulty seeing, even when wearing glasses/contacts?: No Does the patient have difficulty concentrating, remembering, or making decisions?: Yes Does the patient have difficulty walking or climbing stairs?: Yes Does the patient have difficulty dressing or bathing?: No Does the patient have difficulty doing errands alone such as visiting a doctor's office or shopping?: No    Assessment & Plan  1. Dyslipidemia associated with type 2 diabetes mellitus (HCC)  - POCT HgB A1C - ezetimibe (ZETIA) 10 MG tablet; Take 1 tablet (10 mg total) by mouth daily.  Dispense: 90 tablet; Refill: 1 - Alirocumab (PRALUENT) 75 MG/ML SOAJ; INJECT '75MG'$  INTO THE SKIN EVERY 14 DAYS  Dispense: 6 mL; Refill: 1  2. Morbid obesity ([place])  Discussed with the patient the risk posed by an increased BMI. Discussed importance of portion control,  calorie counting and at least 150 minutes of physical activity weekly. Avoid sweet beverages and drink more water. Eat at least 6 servings of fruit and vegetables daily    3. Ventricular flutter ([place])   4. MDD (recurrent major depressive disorder) in remission (HCC)  - DULoxetine (CYMBALTA) 60 MG capsule; Take 1 capsule (60 mg total) by mouth daily.  Dispense: 90 capsule; Refill: 1 - QUEtiapine (SEROQUEL) 25 MG tablet; Take 1 tablet (25 mg total) by mouth at bedtime.  Dispense: 90 tablet; Refill: 0  5. OSA (obstructive sleep apnea)   6. Family history of dementia  - Ambulatory referral to Neurology  7. Adult hypothyroidism  - levothyroxine (SYNTHROID) 100 MCG tablet; TAKE 1 TABLET(100 MCG) BY MOUTH DAILY  Dispense: 90 tablet; Refill: 1  8. Essential hypertension  BP is at goal , medication has been changed   9. Mild intermittent asthma without complication  - montelukast (SINGULAIR) 10 MG tablet; Take 1 tablet (10 mg total) by mouth at bedtime.  Dispense: 90 tablet; Refill: 1  10. Gastroesophageal reflux disease without esophagitis  Stable at this time  11. Cognitive complaints  - Ambulatory referral to Neurology

## 2021-10-29 ENCOUNTER — Ambulatory Visit: Payer: Medicare HMO | Admitting: Gastroenterology

## 2021-10-29 ENCOUNTER — Encounter: Payer: Self-pay | Admitting: Gastroenterology

## 2021-10-29 VITALS — BP 136/88 | HR 68 | Temp 98.3°F | Ht 66.0 in | Wt 267.4 lb

## 2021-10-29 DIAGNOSIS — K580 Irritable bowel syndrome with diarrhea: Secondary | ICD-10-CM

## 2021-10-29 NOTE — Progress Notes (Signed)
Cephas Darby, MD [address]  [place]  [place], [place] [address]  Main: [phone]  Fax: [phone]    Gastroenterology Consultation  Referring Provider:     Steele Sizer, MD Primary Care Physician:  Steele Sizer, MD Primary Gastroenterologist:  Dr. Cephas Darby Reason for Consultation: IBS, dyspepsia        HPI:   Sarah Gordon is a 71 y.o. female referred by Dr. Steele Sizer, MD  for consultation & management of IBS and dyspepsia.  2 months ago, patient went to [place] 2 weeks after she returned from [place], started experiencing abdominal cramps, urgency associated with nausea, bloating and diarrhea.  She has history of IBS, typically her flareups are mild and self-limited.  She was seen by Dr. Ancil Boozer as patient felt the symptoms are different from her IBS symptoms, therefore ordered H. pylori breath test given her history of H. pylori infection in the past.  This is negative.  However, her symptoms of upper abdominal pain, nausea and bloating persisted.  She underwent right upper quadrant ultrasound which revealed fatty liver, no evidence of cholelithiasis. Patient is referred to GI due to persistent symptoms, she is currently taking omeprazole 20 mg daily.  Her most recent labs were unremarkable.  She denies any weight loss  Follow-up visit 05/26/2021 Patient has not seen me since 12/2018.  She is here to discuss about ongoing symptoms of abdominal discomfort, epigastric pain, lower abdominal pain, variable stool consistency, lately loose frequent bowel movements associated with significant abdominal bloating.  Patient's PCP ordered CT abdomen pelvis with contrast, has not been scheduled yet.  She was supposed to provide a stool specimen to rule out infection and other labs which she has not done yet.  Patient denies carbonated beverages, consumption of artificial sweeteners.  Follow-up visit 10/29/2021 Patient is here for follow-up of abdominal bloating,  loose stools.  She underwent upper endoscopy and colonoscopy which were unremarkable.  Celiac disease panel, pancreatic fecal elastase levels were normal.  CT abdomen pelvis was unremarkable.  She tried low FODMAP as well as 2 weeks course of rifaximin which relieved her symptoms.  She is no longer experiencing any GI symptoms.  She does not have any concerns today  She does not smoke or drink alcohol  NSAIDs: None  Antiplts/Anticoagulants/Anti thrombotics: None  GI Procedures:  Colonoscopy 11/19/2015 - The entire examined colon is normal on direct and retroflexion views. - No specimens collected.  Upper endoscopy 08/20/2014 for dysphagia - Normal examined duodenum. - Normal stomach. - Nodular mucosa in the esophagus. Biopsied. - The examination was otherwise normal.  EGD and colonoscopy 06/18/2021 DIAGNOSIS:  A. ESOPHAGUS, LOWER, NODULAR AREA; COLD BIOPSY:  - STRATIFIED SQUAMOUS EPITHELIUM WITH AREAS OF GLYCOGEN ACANTHOSIS.  - NO ACTIVE INFLAMMATION, DYSPLASIA, OR MALIGNANCY.  DIAGNOSIS:  A. DUODENUM; COLD BIOPSY:  - ENTERIC MUCOSA WITH PRESERVED VILLOUS ARCHITECTURE AND NO SIGNIFICANT  HISTOPATHOLOGIC CHANGE.  - NEGATIVE FOR FEATURES OF CELIAC, DYSPLASIA, AND MALIGNANCY.   B. STOMACH; COLD BIOPSY:  - GASTRIC ANTRAL AND OXYNTIC MUCOSA WITH NO SIGNIFICANT HISTOPATHOLOGIC  CHANGE.  - NEGATIVE FOR H. PYLORI, DYSPLASIA, AND MALIGNANCY.   C. COLON, RANDOM; COLD BIOPSY:  - BENIGN COLONIC MUCOSA WITH NO SIGNIFICANT HISTOPATHOLOGIC CHANGE.  - NEGATIVE FOR ACTIVE MUCOSAL COLITIS AND FEATURES OF MICROSCOPIC  COLITIS.  - NEGATIVE FOR DYSPLASIA AND MALIGNANCY.   D. COLON POLYP, ASCENDING; COLD SNARE:  - TUBULAR ADENOMA.  - NEGATIVE FOR HIGH-GRADE DYSPLASIA AND MALIGNANCY.   E. COLON  POLYP, DESCENDING; COLD SNARE:  - TUBULAR ADENOMA.  - NEGATIVE FOR HIGH-GRADE DYSPLASIA AND MALIGNANCY.   Past Medical History:  Diagnosis Date   Allergy    Anxiety '95   Asthma    Cataract     Depression '95   Esophageal reflux    Gouty arthropathy, unspecified    Herpes simplex without mention of complication    Irritable bowel syndrome    Localized osteoarthrosis not specified whether primary or secondary, unspecified site    Migraine, unspecified, without mention of intractable migraine without mention of status migrainosus    Mixed hyperlipidemia    Myalgia and myositis, unspecified    Obstructive sleep apnea (adult) (pediatric)    Palpitations    Sleep apnea    Type II or unspecified type diabetes mellitus without mention of complication, not stated as uncontrolled    Umbilical hernia without mention of obstruction or gangrene    Unspecified essential hypertension    Unspecified hypothyroidism     Past Surgical History:  Procedure Laterality Date   BREAST BIOPSY Left    benign   COLONOSCOPY     Dr Alveta Heimlich   COLONOSCOPY WITH PROPOFOL N/A 11/19/2015   Procedure: COLONOSCOPY WITH PROPOFOL;  Surgeon: Christene Lye, MD;  Location: [facility] ENDOSCOPY;  Service: Endoscopy;  Laterality: N/A;   COLONOSCOPY WITH PROPOFOL N/A 06/18/2021   Procedure: COLONOSCOPY WITH PROPOFOL;  Surgeon: Lin Landsman, MD;  Location: [facility] ENDOSCOPY;  Service: Gastroenterology;  Laterality: N/A;   ESOPHAGOGASTRODUODENOSCOPY N/A 08/20/2014   Procedure: ESOPHAGOGASTRODUODENOSCOPY (EGD);  Surgeon: Christene Lye, MD;  Location: [facility] ENDOSCOPY;  Service: Endoscopy;  Laterality: N/A;   ESOPHAGOGASTRODUODENOSCOPY (EGD) WITH PROPOFOL N/A 06/18/2021   Procedure: ESOPHAGOGASTRODUODENOSCOPY (EGD) WITH PROPOFOL;  Surgeon: Lin Landsman, MD;  Location[facility] ENDOSCOPY;  Service: Gastroenterology;  Laterality: N/A;   FOOT SURGERY Left    heel spur   HERNIA REPAIR     umbilical   NASAL SINUS SURGERY  4709G   UMBILICAL HERNIA REPAIR      Current Outpatient Medications:    acetaminophen (TYLENOL) 650 MG CR tablet, Take 1,300 mg by mouth 2 (two) times daily., Disp: , Rfl:    albuterol  (VENTOLIN HFA) 108 (90 Base) MCG/ACT inhaler, Inhale 2 puffs into the lungs every 6 (six) hours as needed for wheezing or shortness of breath., Disp: 1 each, Rfl: 2   Alirocumab (PRALUENT) 75 MG/ML SOAJ, INJECT '75MG'$  INTO THE SKIN EVERY 14 DAYS, Disp: 6 mL, Rfl: 1   DULoxetine (CYMBALTA) 60 MG capsule, Take 1 capsule (60 mg total) by mouth daily., Disp: 90 capsule, Rfl: 1   ezetimibe (ZETIA) 10 MG tablet, Take 1 tablet (10 mg total) by mouth daily., Disp: 90 tablet, Rfl: 1   fluticasone (FLOVENT HFA) 44 MCG/ACT inhaler, Inhale 1 puff into the lungs in the morning and at bedtime., Disp: 1 each, Rfl: 0   hydrOXYzine (ATARAX/VISTARIL) 10 MG tablet, Take 1 tablet (10 mg total) by mouth 3 (three) times daily as needed., Disp: 30 tablet, Rfl: 0   levothyroxine (SYNTHROID) 100 MCG tablet, TAKE 1 TABLET(100 MCG) BY MOUTH DAILY, Disp: 90 tablet, Rfl: 1   Metoprolol-Hydrochlorothiazide 25-12.5 MG TB24, , Disp: , Rfl:    montelukast (SINGULAIR) 10 MG tablet, Take 1 tablet (10 mg total) by mouth at bedtime., Disp: 90 tablet, Rfl: 1   omeprazole (PRILOSEC) 20 MG capsule, , Disp: , Rfl:    QUEtiapine (SEROQUEL) 25 MG tablet, Take 1 tablet (25 mg total) by mouth at bedtime.,  Disp: 90 tablet, Rfl: 0   traMADol (ULTRAM) 50 MG tablet, Take by mouth., Disp: , Rfl:    triamcinolone (KENALOG) 0.025 % cream, Apply 1 application. topically 2 (two) times daily., Disp: 454 g, Rfl: 0   valsartan-hydrochlorothiazide (DIOVAN-HCT) 160-25 MG tablet, , Disp: , Rfl:     Family History  Problem Relation Age of Onset   Hypertension Mother    Hyperlipidemia Mother    Diabetes Mother    Heart disease Mother    Stroke Mother    Varicose Veins Mother    Heart disease Brother        CABG x 3    Heart attack Brother 48   Hyperlipidemia Brother    Hypertension Brother    Lupus Sister    Arthritis Sister    Depression Sister    Obesity Sister    Asthma Sister    Depression Sister    Hypertension Sister    44 /  Stillbirths Sister    Varicose Veins Sister    Depression Sister    Arthritis Sister    Heart disease Sister    Hyperlipidemia Sister    Hypertension Sister    55 / Stillbirths Sister    Varicose Veins Sister    Diabetes Brother    Hypertension Brother    Obesity Brother    Asthma Brother    Depression Brother    Hyperlipidemia Brother    Early death Brother    Miscarriages / Stillbirths Sister    Arthritis Sister    Depression Sister    Heart disease Sister    Hyperlipidemia Sister    Stroke Maternal Grandfather    Arthritis Brother    Depression Brother    Hyperlipidemia Brother    Hypertension Brother    Breast cancer Neg Hx      Social History   Tobacco Use   Smoking status: Never   Smokeless tobacco: Never  Vaping Use   Vaping Use: Never used  Substance Use Topics   Alcohol use: No    Comment: RARELY   Drug use: No    Allergies as of 10/29/2021 - Review Complete 10/29/2021  Allergen Reaction Noted   Vascepa [icosapent ethyl] Hives and Swelling 07/22/2014   Aspir-81 [aspirin] Other (See Comments) 07/22/2014   Gabapentin Other (See Comments) 05/28/2021   Statins Other (See Comments) 09/01/2015   Topamax [topiramate] Other (See Comments) 12/28/2012   Voltaren [diclofenac sodium] Hives and Other (See Comments) 12/28/2012   Lipitor [atorvastatin]  02/04/2015    Review of Systems:    All systems reviewed and negative except where noted in HPI.   Physical Exam:  BP 136/88 (BP Location: Left Arm, Patient Position: Sitting, Cuff Size: Large)   Pulse 68   Temp 98.3 F (36.8 C) (Oral)   Ht '5\' 6"'$  (1.676 m)   Wt 267 lb 6 oz (121.3 kg)   BMI 43.16 kg/m  No LMP recorded. Patient is postmenopausal.  General:   Alert,  Well-developed, well-nourished, pleasant and cooperative in NAD Head:  Normocephalic and atraumatic. Eyes:  Sclera clear, no icterus.   Conjunctiva pink. Ears:  Normal auditory acuity. Nose:  No deformity, discharge, or  lesions. Mouth:  No deformity or lesions,oropharynx pink & moist. Neck:  Supple; no masses or thyromegaly. Lungs:  Respirations even and unlabored.  Clear throughout to auscultation.   No wheezes, crackles, or rhonchi. No acute distress. Heart:  Regular rate and rhythm; no murmurs, clicks, rubs, or gallops. Abdomen:  Normal bowel  sounds. Soft, non-tender and non-distended without masses, hepatosplenomegaly or hernias noted.  No guarding or rebound tenderness.   Rectal: Not performed Msk:  Symmetrical without gross deformities. Good, equal movement & strength bilaterally. Pulses:  Normal pulses noted. Extremities:  No clubbing or edema.  No cyanosis. Neurologic:  Alert and oriented x3;  grossly normal neurologically. Skin:  Intact without significant lesions or rashes. No jaundice. Psych:  Alert and cooperative. Normal mood and affect.  Imaging Studies: Reviewed  Assessment and Plan:   Sarah Gordon is a 71 y.o. female with metabolic syndrome is seen in consultation for chronic symptoms of epigastric pain, lower abdominal pain, abdominal bloating as well as loose frequent nonbloody bowel movements. Celiac panel negative food allergy profile revealed allergy to scallops only, alpha gal panel negative pancreatic fecal elastase levels were normal upper endoscopy and colonoscopy with TI evaluation, random colon biopsies were unremarkable CT abdomen and pelvis was normal Patient symptoms resolved with low FODMAP diet and 2 weeks course of Xifaxan  Follow up as needed   Cephas Darby, MD

## 2021-11-10 DIAGNOSIS — E538 Deficiency of other specified B group vitamins: Secondary | ICD-10-CM | POA: Diagnosis not present

## 2021-11-10 DIAGNOSIS — R413 Other amnesia: Secondary | ICD-10-CM | POA: Diagnosis not present

## 2021-11-10 DIAGNOSIS — Z820 Family history of epilepsy and other diseases of the nervous system: Secondary | ICD-10-CM | POA: Diagnosis not present

## 2021-11-10 DIAGNOSIS — R69 Illness, unspecified: Secondary | ICD-10-CM | POA: Diagnosis not present

## 2021-11-10 DIAGNOSIS — F339 Major depressive disorder, recurrent, unspecified: Secondary | ICD-10-CM | POA: Diagnosis not present

## 2021-11-12 DIAGNOSIS — Z6841 Body Mass Index (BMI) 40.0 and over, adult: Secondary | ICD-10-CM | POA: Diagnosis not present

## 2021-11-12 DIAGNOSIS — L309 Dermatitis, unspecified: Secondary | ICD-10-CM | POA: Diagnosis not present

## 2021-11-12 DIAGNOSIS — E538 Deficiency of other specified B group vitamins: Secondary | ICD-10-CM | POA: Diagnosis not present

## 2021-11-12 DIAGNOSIS — G4733 Obstructive sleep apnea (adult) (pediatric): Secondary | ICD-10-CM | POA: Diagnosis not present

## 2021-11-12 DIAGNOSIS — I10 Essential (primary) hypertension: Secondary | ICD-10-CM | POA: Diagnosis not present

## 2021-11-12 DIAGNOSIS — E785 Hyperlipidemia, unspecified: Secondary | ICD-10-CM | POA: Diagnosis not present

## 2021-11-12 DIAGNOSIS — G629 Polyneuropathy, unspecified: Secondary | ICD-10-CM | POA: Diagnosis not present

## 2021-11-12 DIAGNOSIS — K219 Gastro-esophageal reflux disease without esophagitis: Secondary | ICD-10-CM | POA: Diagnosis not present

## 2021-11-12 DIAGNOSIS — J45909 Unspecified asthma, uncomplicated: Secondary | ICD-10-CM | POA: Diagnosis not present

## 2021-11-12 DIAGNOSIS — F419 Anxiety disorder, unspecified: Secondary | ICD-10-CM | POA: Diagnosis not present

## 2021-11-12 DIAGNOSIS — Z008 Encounter for other general examination: Secondary | ICD-10-CM | POA: Diagnosis not present

## 2021-11-12 DIAGNOSIS — E039 Hypothyroidism, unspecified: Secondary | ICD-10-CM | POA: Diagnosis not present

## 2021-11-12 DIAGNOSIS — R69 Illness, unspecified: Secondary | ICD-10-CM | POA: Diagnosis not present

## 2021-11-13 ENCOUNTER — Other Ambulatory Visit: Payer: Self-pay | Admitting: Physician Assistant

## 2021-11-13 DIAGNOSIS — R413 Other amnesia: Secondary | ICD-10-CM

## 2021-12-02 ENCOUNTER — Ambulatory Visit
Admission: RE | Admit: 2021-12-02 | Discharge: 2021-12-02 | Disposition: A | Discharge status: Home / Self Care | Payer: Medicare HMO | Source: Ambulatory Visit | Attending: Physician Assistant | Admitting: Physician Assistant

## 2021-12-02 DIAGNOSIS — R413 Other amnesia: Secondary | ICD-10-CM

## 2021-12-02 DIAGNOSIS — R5383 Other fatigue: Secondary | ICD-10-CM | POA: Diagnosis not present

## 2021-12-25 DIAGNOSIS — H2513 Age-related nuclear cataract, bilateral: Secondary | ICD-10-CM | POA: Diagnosis not present

## 2022-01-03 ENCOUNTER — Other Ambulatory Visit: Payer: Self-pay | Admitting: Family Medicine

## 2022-01-03 DIAGNOSIS — I4902 Ventricular flutter: Secondary | ICD-10-CM

## 2022-01-11 ENCOUNTER — Other Ambulatory Visit: Payer: Self-pay | Admitting: Family Medicine

## 2022-01-11 DIAGNOSIS — I4902 Ventricular flutter: Secondary | ICD-10-CM

## 2022-01-18 ENCOUNTER — Other Ambulatory Visit: Payer: Self-pay | Admitting: Family Medicine

## 2022-01-18 DIAGNOSIS — I4902 Ventricular flutter: Secondary | ICD-10-CM

## 2022-01-19 ENCOUNTER — Ambulatory Visit: Payer: Self-pay | Admitting: *Deleted

## 2022-01-19 ENCOUNTER — Other Ambulatory Visit: Payer: Self-pay | Admitting: Family Medicine

## 2022-01-19 ENCOUNTER — Encounter: Payer: Self-pay | Admitting: Family Medicine

## 2022-01-19 DIAGNOSIS — I4902 Ventricular flutter: Secondary | ICD-10-CM

## 2022-01-19 NOTE — Telephone Encounter (Signed)
  Message from [place] sent at 01/19/2022 11:36 AM EDT  Summary: Rx refill request denied and pt requests call back   Pt stated she was informed that the Rx refill request for Metoprolol-Hydrochlorothiazide 25-12.5 MG TB24 was denied. Pt requests call back to advise. Cb# [phone]           Pt already notified Via MyChart.

## 2022-01-19 NOTE — Telephone Encounter (Signed)
Summary: Rx refill request denied and pt requests call back   Pt stated she was informed that the Rx refill request for Metoprolol-Hydrochlorothiazide 25-12.5 MG TB24 was denied. Pt requests call back to advise. Cb# [phone]       Called patient to review medication request. No answer, LVMTCB [phone].

## 2022-01-20 NOTE — Progress Notes (Signed)
Name: Sarah Gordon   MRN: [id number]    DOB: 16-Mar-1951   Date:01/21/2022       Progress Note  Subjective  Chief Complaint  Medication Concerns  HPI  Memory changes: seen by Dr. Malvin Johns, had MRI and went to eye doctor and eye pressure normal, had one B12 injection and is now taking SL B12, memory still the same She has significant history of dementia   IMPRESSION:  1. Partially empty sella which can be a normal variant but can also  be seen in the setting of intracranial hypertension. Otherwise  normal appearance of the brain.  2. Small left mastoid effusion.   HTN: she is out of metoprolol hctz, taking valsartan hctz , we will resume metoprolol today without diuretic. Marland Kitchen History of ventricular flutter in 2020 but resolved since - seen by cardiologist , BP today and heart rate due to been out of metoprolol for the past week, should normalize once she resumes medication. No chest pain or palpitation, stable sob with moderate activity    Bloating: she developed diarrhea early Dec, she was seen we order PCR study but diarrhea resolved and it was done, when she came in for follow up in Feb she was still having  abdominal bloating and distention also episodes of indigestion, no nausea. She was referred to GI and had colonoscopy and EGD end of March that were unremarkable except for polyps . She was advised to take antibiotics for two weeks for possible infection. She did FODMAP diet but could not identify trigger and is back on a regular diet.  Taking omeprazole and is feeling better, able to eat a small amount of glutten    Ventricular flutter: seen by cardiologist, medication for thyroid adjusted and feeling well since and taking metoprolol . Unchanged    MDD: she is under more stress, lost a brother in law, her brother that has dementia fell and is not doing well, worried about her own memory loss, still taking duloxetine, seroquel for sleep but sometimes does not work, she would like a refill of  hydroxizine to take prn    DMII: doing well on life style modification. A1C has been  at goal. Denies polyphagia, polydipsia or polyuria She has a history of neuropathy, very mild symptoms. She is on Zetia  and Praluent for dyslipidemia, unable to tolerate statin therapy, and also on Praluent    DDD lumbar spine: she had MRI done back in 2010 and showed herniated disc, she had steroid injections twice and it helped. However pain is back , she has been using a walker since it decreased the pain.  She staes the pain is worse on the right lower back, but can radiate both legs.  Pain right now is 2/10 She takes Tramadol prn    OA: knees and feet, also has FMS, she states her legs are doing better, except for when radiates down her legs.  using walker to assist with gait    Asthma: She has occasional dry cough, SOB and wheezing a couple of times a month . She uses Flovent prn but rx expired, she will call if she needs a refill    Morbid Obesity: BMI above 40, her weight is trending up, discussed life style modification   Hypothyroidism: she is taking medication daily, denies hair loss, she has chronic dry skin and chronic constipation Last TSH is at goal and we will continue to monitor it yearly      Patient Active Problem List  Diagnosis Date Noted   Cognitive complaints 10/27/2021   Polyp of colon    Lower abdominal pain    Lumbar radiculopathy 11/06/2020   Ventricular flutter (HCC) 12/06/2018   MDD (major depressive disorder), recurrent episode, mild (HCC) 09/04/2018   DDD (degenerative disc disease), cervical 01/23/2016   Degenerative joint disease of left acromioclavicular joint 01/23/2016   Recurrent oral herpes simplex infection 09/01/2015   Irritable bowel syndrome with diarrhea 08/07/2015   Statin intolerance 02/28/2015   Asthma, intermittent 09/10/2014   Controlled type 2 diabetes with neuropathy (HCC) 09/10/2014   Fibromyalgia 09/10/2014   Obstructive sleep apnea 09/10/2014    Hyperlipidemia 12/29/2012   Essential hypertension 12/29/2012   Morbid obesity (HCC) 12/29/2012   Arthritis due to gout 12/13/2007   Acid reflux 10/24/2006   Adult hypothyroidism 10/24/2006   Localized osteoarthrosis 10/24/2006    Past Surgical History:  Procedure Laterality Date   BREAST BIOPSY Left    benign   COLONOSCOPY     Dr Thurmond Butts   COLONOSCOPY WITH PROPOFOL N/A 11/19/2015   Procedure: COLONOSCOPY WITH PROPOFOL;  Surgeon: Kieth Brightly, MD;  Location: [facility] ENDOSCOPY;  Service: Endoscopy;  Laterality: N/A;   COLONOSCOPY WITH PROPOFOL N/A 06/18/2021   Procedure: COLONOSCOPY WITH PROPOFOL;  Surgeon: Toney Reil, MD;  Location: [facility] ENDOSCOPY;  Service: Gastroenterology;  Laterality: N/A;   ESOPHAGOGASTRODUODENOSCOPY N/A 08/20/2014   Procedure: ESOPHAGOGASTRODUODENOSCOPY (EGD);  Surgeon: Kieth Brightly, MD;  Location: [facility] ENDOSCOPY;  Service: Endoscopy;  Laterality: N/A;   ESOPHAGOGASTRODUODENOSCOPY (EGD) WITH PROPOFOL N/A 06/18/2021   Procedure: ESOPHAGOGASTRODUODENOSCOPY (EGD) WITH PROPOFOL;  Surgeon: Toney Reil, MD;  Location: [facility] ENDOSCOPY;  Service: Gastroenterology;  Laterality: N/A;   FOOT SURGERY Left    heel spur   HERNIA REPAIR     umbilical   NASAL SINUS SURGERY  1980s   UMBILICAL HERNIA REPAIR      Family History  Problem Relation Age of Onset   Hypertension Mother    Hyperlipidemia Mother    Diabetes Mother    Heart disease Mother    Stroke Mother    Varicose Veins Mother    Heart disease Brother        CABG x 3    Heart attack Brother 9   Hyperlipidemia Brother    Hypertension Brother    Lupus Sister    Arthritis Sister    Depression Sister    Obesity Sister    Asthma Sister    Depression Sister    Hypertension Sister    Miscarriages / Stillbirths Sister    Varicose Veins Sister    Depression Sister    Arthritis Sister    Heart disease Sister    Hyperlipidemia Sister    Hypertension Sister    Miscarriages /  Stillbirths Sister    Varicose Veins Sister    Diabetes Brother    Hypertension Brother    Obesity Brother    Asthma Brother    Depression Brother    Hyperlipidemia Brother    Early death Brother    Miscarriages / Stillbirths Sister    Arthritis Sister    Depression Sister    Heart disease Sister    Hyperlipidemia Sister    Stroke Maternal Grandfather    Arthritis Brother    Depression Brother    Hyperlipidemia Brother    Hypertension Brother    Breast cancer Neg Hx     Social History   Tobacco Use   Smoking status: Never   Smokeless tobacco: Never  Substance Use Topics   Alcohol use: No    Comment: RARELY     Current Outpatient Medications:    acetaminophen (TYLENOL) 650 MG CR tablet, Take 1,300 mg by mouth 2 (two) times daily., Disp: , Rfl:    albuterol (VENTOLIN HFA) 108 (90 Base) MCG/ACT inhaler, Inhale 2 puffs into the lungs every 6 (six) hours as needed for wheezing or shortness of breath., Disp: 1 each, Rfl: 2   Alirocumab (PRALUENT) 75 MG/ML SOAJ, INJECT 75MG  INTO THE SKIN EVERY 14 DAYS, Disp: 6 mL, Rfl: 1   DULoxetine (CYMBALTA) 60 MG capsule, Take 1 capsule (60 mg total) by mouth daily., Disp: 90 capsule, Rfl: 1   ezetimibe (ZETIA) 10 MG tablet, Take 1 tablet (10 mg total) by mouth daily., Disp: 90 tablet, Rfl: 1   Fluticasone Propionate, Inhal, 100 MCG/ACT AEPB, Inhale 2 puffs into the lungs 2 (two) times daily., Disp: 60 each, Rfl: 1   levothyroxine (SYNTHROID) 100 MCG tablet, TAKE 1 TABLET(100 MCG) BY MOUTH DAILY, Disp: 90 tablet, Rfl: 1   metoprolol succinate (TOPROL-XL) 25 MG 24 hr tablet, Take 1 tablet (25 mg total) by mouth daily., Disp: 90 tablet, Rfl: 1   montelukast (SINGULAIR) 10 MG tablet, Take 1 tablet (10 mg total) by mouth at bedtime., Disp: 90 tablet, Rfl: 1   omeprazole (PRILOSEC) 20 MG capsule, Take 20 mg by mouth daily., Disp: , Rfl:    traMADol (ULTRAM) 50 MG tablet, Take 50 mg by mouth daily as needed., Disp: , Rfl:    triamcinolone  (KENALOG) 0.025 % cream, Apply 1 application. topically 2 (two) times daily., Disp: 454 g, Rfl: 0   hydrOXYzine (ATARAX) 10 MG tablet, Take 1 tablet (10 mg total) by mouth 3 (three) times daily as needed., Disp: 30 tablet, Rfl: 0   QUEtiapine (SEROQUEL) 25 MG tablet, Take 1 tablet (25 mg total) by mouth at bedtime., Disp: 90 tablet, Rfl: 0   valsartan-hydrochlorothiazide (DIOVAN-HCT) 160-25 MG tablet, Take 1 tablet by mouth daily., Disp: 90 tablet, Rfl: 1  Allergies  Allergen Reactions   Vascepa [Icosapent Ethyl] Hives and Swelling   Aspir-81 [Aspirin] Other (See Comments)    Coagulation disorder   Gabapentin Other (See Comments)    Cognitive/memory issues   Statins Other (See Comments)   Topamax [Topiramate] Other (See Comments)    Cognitive issues    Voltaren [Diclofenac Sodium] Hives and Other (See Comments)   Lipitor [Atorvastatin]     Joint pain    I personally reviewed active problem list, medication list, allergies, family history, social history with the patient/caregiver today.   ROS  Ten systems reviewed and is negative except as mentioned in HPI   Objective  Vitals:   01/21/22 1034 01/21/22 1053  BP: (!) 142/84 (!) 142/94  Pulse: (!) 110   Resp: 18   Temp: 98 F (36.7 C)   SpO2: 99%   Weight: 274 lb 9.6 oz (124.6 kg)   Height: 5\' 4"  (1.626 m)     Body mass index is 47.13 kg/m.  Physical Exam  Constitutional: Patient appears well-developed and well-nourished. Obese  No distress.  HEENT: head atraumatic, normocephalic, pupils equal and reactive to ligh, neck supple Cardiovascular: Normal rate, regular rhythm and normal heart sounds.  No murmur heard. No BLE edema. Pulmonary/Chest: Effort normal and breath sounds normal. No respiratory distress. Abdominal: Soft.  There is no tenderness. Muscular skeletal using walker  Psychiatric: Patient has a normal mood and affect. behavior is normal. Judgment and thought content  normal.   Recent Results (from the past  2160 hour(s))  POCT HgB A1C     Status: Abnormal   Collection Time: 10/27/21  8:57 AM  Result Value Ref Range   Hemoglobin A1C 5.7 (A) 4.0 - 5.6 %   HbA1c POC (<> result, manual entry)     HbA1c, POC (prediabetic range)     HbA1c, POC (controlled diabetic range)      Diabetic Foot Exam: Diabetic Foot Exam - Simple   Simple Foot Form Visual Inspection See comments: Yes Sensation Testing Intact to touch and monofilament testing bilaterally: Yes Pulse Check Posterior Tibialis and Dorsalis pulse intact bilaterally: Yes Comments Flat feet, bunion right foot, corn formation under big toes      PHQ2/9:    01/21/2022   10:38 AM 10/27/2021    8:23 AM 05/28/2021    8:09 AM 03/31/2021    8:09 AM 02/19/2021   11:34 AM  Depression screen PHQ 2/9  Decreased Interest 0 0 0 0 0  Down, Depressed, Hopeless 1 2 0 0 0  PHQ - 2 Score 1 2 0 0 0  Altered sleeping 0 2 2 0 0  Tired, decreased energy 0 2 1 0 0  Change in appetite 0 0 0 0 0  Feeling bad or failure about yourself  0 1 0 0 0  Trouble concentrating 0 0 0 0 0  Moving slowly or fidgety/restless 0 0 0 0 0  Suicidal thoughts 0 0 0 0 0  PHQ-9 Score 1 7 3  0 0  Difficult doing work/chores Not difficult at all        phq 9 is positive   Fall Risk:    01/21/2022   10:38 AM 10/27/2021    8:23 AM 05/28/2021    8:12 AM 03/31/2021    8:09 AM 02/19/2021   11:34 AM  Fall Risk   Falls in the past year? 0 0 1 1 1   Number falls in past yr: 0 0 1 1 1   Injury with Fall? 0 0 1 1 1   Risk for fall due to : Impaired balance/gait;Impaired mobility No Fall Risks History of fall(s) No Fall Risks No Fall Risks  Follow up  Falls prevention discussed Falls prevention discussed Falls prevention discussed Falls prevention discussed     Functional Status Survey: Is the patient deaf or have difficulty hearing?: No Does the patient have difficulty seeing, even when wearing glasses/contacts?: No Does the patient have difficulty concentrating, remembering, or  making decisions?: Yes Does the patient have difficulty walking or climbing stairs?: Yes Does the patient have difficulty dressing or bathing?: No Does the patient have difficulty doing errands alone such as visiting a doctor's office or shopping?: No    Assessment & Plan  1. Need for immunization against influenza  She states had it at local pharmacy and we will obtain records   2. Dyslipidemia associated with type 2 diabetes mellitus (HCC)  A1C at goal  3. Essential hypertension  - metoprolol succinate (TOPROL-XL) 25 MG 24 hr tablet; Take 1 tablet (25 mg total) by mouth daily.  Dispense: 90 tablet; Refill: 1 - valsartan-hydrochlorothiazide (DIOVAN-HCT) 160-25 MG tablet; Take 1 tablet by mouth daily.  Dispense: 90 tablet; Refill: 1  4. Panic attack  - hydrOXYzine (ATARAX) 10 MG tablet; Take 1 tablet (10 mg total) by mouth 3 (three) times daily as needed.  Dispense: 30 tablet; Refill: 0  5. MDD (recurrent major depressive disorder) in remission (HCC)  - QUEtiapine (SEROQUEL)  25 MG tablet; Take 1 tablet (25 mg total) by mouth at bedtime.  Dispense: 90 tablet; Refill: 0  6. Adult hypothyroidism   7. Morbid obesity (HCC)  Discussed with the patient the risk posed by an increased BMI. Discussed importance of portion control, calorie counting and at least 150 minutes of physical activity weekly. Avoid sweet beverages and drink more water. Eat at least 6 servings of fruit and vegetables daily   8. Ventricular flutter (HCC)  Resume beta blocker  9. Family history of dementia  Seen by neurologist   10. OSA (obstructive sleep apnea)  Needs to have another CPAP  11. Moderate persistent asthma without complication  - Fluticasone Propionate, Inhal, 100 MCG/ACT AEPB; Inhale 2 puffs into the lungs 2 (two) times daily.  Dispense: 60 each; Refill: 1

## 2022-01-21 ENCOUNTER — Ambulatory Visit (INDEPENDENT_AMBULATORY_CARE_PROVIDER_SITE_OTHER): Payer: Medicare HMO | Admitting: Family Medicine

## 2022-01-21 ENCOUNTER — Encounter: Payer: Self-pay | Admitting: Family Medicine

## 2022-01-21 VITALS — BP 142/94 | HR 110 | Temp 98.0°F | Resp 18 | Ht 64.0 in | Wt 274.6 lb

## 2022-01-21 DIAGNOSIS — E785 Hyperlipidemia, unspecified: Secondary | ICD-10-CM

## 2022-01-21 DIAGNOSIS — Z23 Encounter for immunization: Secondary | ICD-10-CM | POA: Diagnosis not present

## 2022-01-21 DIAGNOSIS — J4540 Moderate persistent asthma, uncomplicated: Secondary | ICD-10-CM

## 2022-01-21 DIAGNOSIS — F410 Panic disorder [episodic paroxysmal anxiety] without agoraphobia: Secondary | ICD-10-CM

## 2022-01-21 DIAGNOSIS — E039 Hypothyroidism, unspecified: Secondary | ICD-10-CM | POA: Diagnosis not present

## 2022-01-21 DIAGNOSIS — G4733 Obstructive sleep apnea (adult) (pediatric): Secondary | ICD-10-CM

## 2022-01-21 DIAGNOSIS — I4902 Ventricular flutter: Secondary | ICD-10-CM | POA: Diagnosis not present

## 2022-01-21 DIAGNOSIS — I10 Essential (primary) hypertension: Secondary | ICD-10-CM

## 2022-01-21 DIAGNOSIS — R69 Illness, unspecified: Secondary | ICD-10-CM | POA: Diagnosis not present

## 2022-01-21 DIAGNOSIS — F3340 Major depressive disorder, recurrent, in remission, unspecified: Secondary | ICD-10-CM

## 2022-01-21 DIAGNOSIS — Z818 Family history of other mental and behavioral disorders: Secondary | ICD-10-CM

## 2022-01-21 DIAGNOSIS — E1169 Type 2 diabetes mellitus with other specified complication: Secondary | ICD-10-CM

## 2022-01-21 MED ORDER — METOPROLOL SUCCINATE ER 25 MG PO TB24: 25.0000 mg | ORAL_TABLET | Freq: Every day | ORAL | 1 refills | Status: DC

## 2022-01-21 MED ORDER — FLUTICASONE PROPIONATE (INHAL) 100 MCG/ACT IN AEPB: 2.0000 | INHALATION_SPRAY | Freq: Two times a day (BID) | RESPIRATORY_TRACT | 1 refills | Status: DC

## 2022-01-21 MED ORDER — QUETIAPINE FUMARATE 25 MG PO TABS: 25.0000 mg | ORAL_TABLET | Freq: Every day | ORAL | 0 refills | Status: DC

## 2022-01-21 MED ORDER — HYDROXYZINE HCL 10 MG PO TABS: 10.0000 mg | ORAL_TABLET | Freq: Three times a day (TID) | ORAL | 0 refills | Status: DC | PRN

## 2022-01-21 MED ORDER — VALSARTAN-HYDROCHLOROTHIAZIDE 160-25 MG PO TABS: 1.0000 | ORAL_TABLET | Freq: Every day | ORAL | 1 refills | Status: DC

## 2022-01-23 ENCOUNTER — Other Ambulatory Visit: Payer: Self-pay | Admitting: Family Medicine

## 2022-01-23 DIAGNOSIS — F3340 Major depressive disorder, recurrent, in remission, unspecified: Secondary | ICD-10-CM

## 2022-01-25 ENCOUNTER — Other Ambulatory Visit: Payer: Self-pay

## 2022-01-25 NOTE — Telephone Encounter (Signed)
Requested medications are due for refill today.  no  Requested medications are on the active medications list.  yes  Last refill. 01/21/2022  - confirmed by same pharmacy  Future visit scheduled.   yes  Notes to clinic.  Refill not delegated.    Requested Prescriptions  Pending Prescriptions Disp Refills   QUEtiapine (SEROQUEL) 25 MG tablet [Pharmacy Med Name: QUEtiapine FUMARATE 25 MG TAB] 90 tablet 0    Sig: TAKE 1 TABLET BY MOUTH AT BEDTIME     Not Delegated - Psychiatry:  Antipsychotics - Second Generation (Atypical) - quetiapine Failed - 01/23/2022  6:53 AM      Failed - This refill cannot be delegated      Failed - Last BP in normal range    BP Readings from Last 1 Encounters:  01/21/22 (!) 142/94         Failed - Lipid Panel in normal range within the last 12 months    Cholesterol, Total  Date Value Ref Range Status  09/11/2014 161 100 - 199 mg/dL Final   Cholesterol  Date Value Ref Range Status  05/28/2021 170 <200 mg/dL Final   LDL Cholesterol (Calc)  Date Value Ref Range Status  05/28/2021 98 mg/dL (calc) Final    Comment:    Reference range: <100 . Desirable range <100 mg/dL for primary prevention;   <70 mg/dL for patients with CHD or diabetic patients  with > or = 2 CHD risk factors. Marland Kitchen LDL-C is now calculated using the Martin-Hopkins  calculation, which is a validated novel method providing  better accuracy than the Friedewald equation in the  estimation of LDL-C.  Cresenciano Genre et al. Annamaria Helling. 1829;937(16): 2061-2068  (http://education.[facility].com/faq/FAQ164)    HDL  Date Value Ref Range Status  05/28/2021 38 (L) > OR = 50 mg/dL Final  09/11/2014 45 >39 mg/dL Final    Comment:    According to ATP-III Guidelines, HDL-C >59 mg/dL is considered a negative risk factor for CHD.    Triglycerides  Date Value Ref Range Status  05/28/2021 224 (H) <150 mg/dL Final    Comment:    . If a non-fasting specimen was collected, consider repeat  triglyceride testing on a fasting specimen if clinically indicated.  Yates Decamp et al. J. of Clin. Lipidol. 9678;9:381-017. .          Failed - CMP within normal limits and completed in the last 12 months    Albumin  Date Value Ref Range Status  10/23/2015 3.8 3.6 - 5.1 g/dL Final  09/11/2014 3.9 3.6 - 4.8 g/dL Final   Alkaline Phosphatase  Date Value Ref Range Status  10/23/2015 63 33 - 130 U/L Final   Alkaline phosphatase (APISO)  Date Value Ref Range Status  05/28/2021 75 37 - 153 U/L Final   ALT  Date Value Ref Range Status  05/28/2021 17 6 - 29 U/L Final   AST  Date Value Ref Range Status  05/28/2021 20 10 - 35 U/L Final   BUN  Date Value Ref Range Status  05/28/2021 16 7 - 25 mg/dL Final  09/11/2014 21 8 - 27 mg/dL Final   Calcium  Date Value Ref Range Status  05/28/2021 9.1 8.6 - 10.4 mg/dL Final   CO2  Date Value Ref Range Status  05/28/2021 27 20 - 32 mmol/L Final   Creat  Date Value Ref Range Status  05/28/2021 0.97 0.50 - 1.05 mg/dL Final   Creatinine, Urine  Date Value Ref Range Status  05/28/2021  140 20 - 275 mg/dL Final   Glucose  Date Value Ref Range Status  03/06/2014 87 mg/dL Final   Glucose, Bld  Date Value Ref Range Status  05/28/2021 126 (H) 65 - 99 mg/dL Final    Comment:    .            Fasting reference interval . For someone without known diabetes, a glucose value >125 mg/dL indicates that they may have diabetes and this should be confirmed with a follow-up test. .    POC Glucose  Date Value Ref Range Status  01/13/2015 105 (A) 70 - 99 mg/dl Final   Glucose-Capillary  Date Value Ref Range Status  08/20/2014 99 65 - 99 mg/dL Final   Potassium  Date Value Ref Range Status  05/28/2021 4.4 3.5 - 5.3 mmol/L Final   Sodium  Date Value Ref Range Status  05/28/2021 141 135 - 146 mmol/L Final  09/11/2014 144 134 - 144 mmol/L Final   Total Bilirubin  Date Value Ref Range Status  05/28/2021 0.4 0.2 - 1.2 mg/dL Final    Bilirubin Total  Date Value Ref Range Status  09/11/2014 0.3 0.0 - 1.2 mg/dL Final   Total Protein  Date Value Ref Range Status  05/28/2021 6.5 6.1 - 8.1 g/dL Final  09/11/2014 5.8 (L) 6.0 - 8.5 g/dL Final   GFR, Est African American  Date Value Ref Range Status  05/23/2020 81 > OR = 60 mL/min/1.28m Final   eGFR  Date Value Ref Range Status  05/28/2021 64 > OR = 60 mL/min/1.772mFinal    Comment:    The eGFR is based on the CKD-EPI 2021 equation. To calculate  the new eGFR from a previous Creatinine or Cystatin C result, go to https://www.kidney.org/professionals/ kdoqi/gfr%5Fcalculator    GFR, Est Non African American  Date Value Ref Range Status  05/23/2020 70 > OR = 60 mL/min/1.7327minal         Passed - TSH in normal range and within 360 days    TSH  Date Value Ref Range Status  05/28/2021 1.82 0.40 - 4.50 mIU/L Final         Passed - Completed PHQ-2 or PHQ-9 in the last 360 days      Passed - Last Heart Rate in normal range    Pulse Readings from Last 1 Encounters:  01/21/22 (!) 110         Passed - Valid encounter within last 6 months    Recent Outpatient Visits           4 days ago [facility]ys[facility] type 2 diabetes mellitus ([facility]   3 months ago [facility] with [facility]   10 months ago [facility]ys[facility] with type 2 diabetes [facility]   11 months ago Gastroenteritis, acute   [facility]   1 year ago [facility]yslipidemia [facility] with type 2 diabetes mellitus [facility]       Future Appointments             In 2 months SowAncil [facility]zerriDrue StagerD [facility]  [facility]  normal limits and completed in the last 12 months    WBC  Date Value Ref Range Status  05/28/2021 3.7 (L) 3.8 - 10.8 Thousand/uL Final   RBC  Date Value Ref Range Status  05/28/2021 4.24 3.80 - 5.10 Million/uL Final   Hemoglobin  Date Value Ref Range Status  05/28/2021 13.5 11.7 - 15.5 g/dL Final   HCT  Date Value Ref Range Status  05/28/2021 40.2 35.0 - 45.0 % Final   MCHC  Date Value Ref Range Status  05/28/2021 33.6 32.0 - 36.0 g/dL Final   [facility]  Date Value Ref Range Status  05/28/2021 31.8 27.0 - 33.0 pg Final   MCV  Date Value Ref Range Status  05/28/2021 94.8 80.0 - 100.0 fL Final   No results found for: "PLTCOUNTKUC", "LABPLAT", "POCPLA" RDW  Date Value Ref Range Status  05/28/2021 13.6 11.0 - 15.0 % Final

## 2022-01-28 ENCOUNTER — Other Ambulatory Visit: Payer: Self-pay

## 2022-01-28 DIAGNOSIS — E1140 Type 2 diabetes mellitus with diabetic neuropathy, unspecified: Secondary | ICD-10-CM

## 2022-02-02 ENCOUNTER — Other Ambulatory Visit: Payer: Self-pay | Admitting: Family Medicine

## 2022-02-16 DIAGNOSIS — R413 Other amnesia: Secondary | ICD-10-CM | POA: Diagnosis not present

## 2022-02-16 DIAGNOSIS — E538 Deficiency of other specified B group vitamins: Secondary | ICD-10-CM | POA: Diagnosis not present

## 2022-02-16 DIAGNOSIS — R69 Illness, unspecified: Secondary | ICD-10-CM | POA: Diagnosis not present

## 2022-02-16 DIAGNOSIS — Z820 Family history of epilepsy and other diseases of the nervous system: Secondary | ICD-10-CM | POA: Diagnosis not present

## 2022-02-17 ENCOUNTER — Other Ambulatory Visit: Payer: Self-pay | Admitting: Family Medicine

## 2022-02-17 DIAGNOSIS — I10 Essential (primary) hypertension: Secondary | ICD-10-CM

## 2022-02-20 ENCOUNTER — Emergency Department
Admission: EM | Admit: 2022-02-20 | Discharge: 2022-02-20 | Disposition: A | Discharge status: Home / Self Care | Payer: Medicare HMO | Attending: Emergency Medicine | Admitting: Emergency Medicine

## 2022-02-20 ENCOUNTER — Emergency Department: Payer: Medicare HMO

## 2022-02-20 ENCOUNTER — Other Ambulatory Visit: Payer: Self-pay

## 2022-02-20 DIAGNOSIS — R079 Chest pain, unspecified: Secondary | ICD-10-CM | POA: Diagnosis not present

## 2022-02-20 DIAGNOSIS — R519 Headache, unspecified: Secondary | ICD-10-CM | POA: Diagnosis not present

## 2022-02-20 DIAGNOSIS — R0789 Other chest pain: Secondary | ICD-10-CM | POA: Diagnosis not present

## 2022-02-20 DIAGNOSIS — R0602 Shortness of breath: Secondary | ICD-10-CM | POA: Diagnosis not present

## 2022-02-20 DIAGNOSIS — R42 Dizziness and giddiness: Secondary | ICD-10-CM | POA: Insufficient documentation

## 2022-02-20 LAB — CBC WITH DIFFERENTIAL/PLATELET
Abs Immature Granulocytes: 0.01 10*3/uL (ref 0.00–0.07)
Basophils Absolute: 0 10*3/uL (ref 0.0–0.1)
Basophils Relative: 1 %
Eosinophils Absolute: 0.1 10*3/uL (ref 0.0–0.5)
Eosinophils Relative: 2 %
HCT: 39.1 % (ref 36.0–46.0)
Hemoglobin: 12.9 g/dL (ref 12.0–15.0)
Immature Granulocytes: 0 %
Lymphocytes Relative: 24 %
Lymphs Abs: 1 10*3/uL (ref 0.7–4.0)
MCH: 31.2 pg (ref 26.0–34.0)
MCHC: 33 g/dL (ref 30.0–36.0)
MCV: 94.4 fL (ref 80.0–100.0)
Monocytes Absolute: 0.3 10*3/uL (ref 0.1–1.0)
Monocytes Relative: 7 %
Neutro Abs: 2.9 10*3/uL (ref 1.7–7.7)
Neutrophils Relative %: 66 %
Platelets: 257 10*3/uL (ref 150–400)
RBC: 4.14 MIL/uL (ref 3.87–5.11)
RDW: 12.8 % (ref 11.5–15.5)
WBC: 4.3 10*3/uL (ref 4.0–10.5)
nRBC: 0 % (ref 0.0–0.2)

## 2022-02-20 LAB — COMPREHENSIVE METABOLIC PANEL
ALT: 25 U/L (ref 0–44)
AST: 27 U/L (ref 15–41)
Albumin: 3.6 g/dL (ref 3.5–5.0)
Alkaline Phosphatase: 70 U/L (ref 38–126)
Anion gap: 5 (ref 5–15)
BUN: 23 mg/dL (ref 8–23)
CO2: 25 mmol/L (ref 22–32)
Calcium: 8.7 mg/dL — ABNORMAL LOW (ref 8.9–10.3)
Chloride: 109 mmol/L (ref 98–111)
Creatinine, Ser: 1.06 mg/dL — ABNORMAL HIGH (ref 0.44–1.00)
GFR, Estimated: 57 mL/min — ABNORMAL LOW (ref >60)
Glucose, Bld: 116 mg/dL — ABNORMAL HIGH (ref 70–99)
Potassium: 4 mmol/L (ref 3.5–5.1)
Sodium: 139 mmol/L (ref 135–145)
Total Bilirubin: 0.4 mg/dL (ref 0.3–1.2)
Total Protein: 6.8 g/dL (ref 6.5–8.1)

## 2022-02-20 LAB — TROPONIN I (HIGH SENSITIVITY): Troponin I (High Sensitivity): 4 ng/L (ref <18)

## 2022-02-20 MED ORDER — MECLIZINE HCL 25 MG PO TABS: 25.0000 mg | ORAL_TABLET | Freq: Three times a day (TID) | ORAL | 0 refills | Status: DC | PRN

## 2022-02-20 MED ORDER — SODIUM CHLORIDE 0.9 % IV BOLUS
1000.0000 mL | Freq: Once | INTRAVENOUS | Status: AC
  Administered 2022-02-20: 1000 mL via INTRAVENOUS

## 2022-02-20 MED ORDER — GADOBUTROL 1 MMOL/ML IV SOLN
10.0000 mL | Freq: Once | INTRAVENOUS | Status: AC | PRN
  Administered 2022-02-20: 10 mL via INTRAVENOUS

## 2022-02-20 NOTE — Discharge Instructions (Addendum)
You were seen in the emergency department for dizziness and chest pain.  You had an MRI of your head and neck that did not show any signs of a stroke.  You had a heart enzyme that was normal.  Do not believe you are having a heart attack today.  You are given a prescription for meclizine which are dizzy tablets.  You are also given information of how to perform an Epley maneuver at home.  Perform this maneuver 3 times in the morning, afternoon and nighttime.  Call your primary care physician to schedule close follow-up.  You are given information to follow-up with the ear nose and throat specialist.  Return to the emergency department for any worsening symptoms.

## 2022-02-20 NOTE — ED Provider Triage Note (Signed)
Emergency Medicine Provider Triage Evaluation Note  SHAYANNA THATCH , a 71 y.o. female  was evaluated in triage.  Pt complains of dizziness, symptoms x2 days.  Has a referral to cardiology but has not been able to make the appointment as she missed their phone call them today.  Review of Systems  Positive:  Negative:   Physical Exam  BP 131/78 (BP Location: Left Arm)   Pulse 82   Temp 97.9 F (36.6 C) (Oral)   Resp 15   Ht '5\' 6"'$  (1.676 m)   Wt 118.8 kg   SpO2 94%   BMI 42.29 kg/m  Gen:   Awake, no distress   Resp:  Normal effort  MSK:   Moves extremities without difficulty  Other:    Medical Decision Making  Medically screening exam initiated at 12:51 PM.  Appropriate orders placed.  TOIYA MORRISH was informed that the remainder of the evaluation will be completed by another provider, this initial triage assessment does not replace that evaluation, and the importance of remaining in the ED until their evaluation is complete.     Versie Starks, PA-C 02/20/22 1252

## 2022-02-20 NOTE — ED Notes (Addendum)
EDP at bedside. Pt states CP has resolved. Feeling dizzy and off balance since 5 days. Pt is alert, oriented. No acute distress, on room air. EDP walked pt at bedside who continues to feel off balance with walking.

## 2022-02-20 NOTE — ED Notes (Signed)
Signing pad did not work. Pt verbalized understanding of DC instructions.

## 2022-02-20 NOTE — ED Notes (Signed)
ED Provider at bedside. 

## 2022-02-20 NOTE — ED Provider Notes (Signed)
[facility] Provider Note    Event Date/Time   First MD Initiated Contact with Sarah 02/20/22 1617     (approximate)   History   Chest Pain   HPI  Sarah Gordon is a 71 y.o. female who presents to the emergency department with dizziness.  Sarah Gordon is ongoing dizziness that started on Monday.  States that whenever she was putting up Christmas decorations she leaned over and when stood up and had severe onset of room spinning dizziness.  Felt like she was off balance.  Intermittent episodes since that time that is worse with standing or head movement.  Feels off balance.  Denies any falls or head trauma.  Denies any new tinnitus or change of hearing.  Denies any numbness or extremity of upper or lower extremities.  Today had an episode of chest pain.  Describes chest pressure sensation.  Denies any active chest pain at this time.  No history of DVT or PE.  No cough or shortness of breath.     Physical Exam   Triage Vital Signs: ED Triage Vitals  Enc Vitals Group     BP 02/20/22 1245 131/78     Pulse Rate 02/20/22 1245 82     Resp 02/20/22 1245 15     Temp 02/20/22 1245 97.9 F (36.6 C)     Temp Source 02/20/22 1245 Oral     SpO2 02/20/22 1245 94 %     Weight 02/20/22 1245 262 lb (118.8 kg)     Height 02/20/22 1245 '5\' 6"'$  (1.676 m)     Head Circumference --      Peak Flow --      Pain Score 02/20/22 1252 5     Pain Loc --      Pain Edu? --      Excl. in [place]? --     Most recent vital signs: Vitals:   02/20/22 1920 02/20/22 1922  BP: 121/74 114/73  Pulse: 89 83  Resp:    Temp:    SpO2:      Physical Exam Constitutional:      Appearance: She is well-developed.  HENT:     Head: Atraumatic.  Eyes:     Extraocular Movements: Extraocular movements intact.     Conjunctiva/sclera: Conjunctivae normal.     Pupils: Pupils are equal, round, and reactive to light.  Cardiovascular:     Rate and Rhythm: Regular rhythm.  Pulmonary:     Effort:  No respiratory distress.  Abdominal:     General: There is no distension.     Palpations: Abdomen is soft.  Musculoskeletal:        General: Normal range of motion.     Cervical back: Normal range of motion.  Skin:    General: Skin is warm.  Neurological:     Mental Status: She is alert and oriented to person, place, and time. Mental status is at baseline.     Comments: Cranial nerves grossly intact.  5/5 strength bilateral upper and lower extremities.  Sensation intact in upper and lower extremities.  Normal gait.          IMPRESSION / MDM / ASSESSMENT AND PLAN / ED COURSE  I reviewed the triage vital signs and the nursing notes.  Differential diagnosis including dehydration, peripheral vertigo, central vertigo, dissection, ACS, electrolyte abnormalities    EKG  I, Nathaniel Man, the attending physician, personally viewed and interpreted this ECG.   Rate: Normal  Rhythm:  Normal sinus  Axis: Normal  Intervals: Normal  ST&T Change: None  No tachycardic or bradycardic dysrhythmias while on cardiac telemetry.  RADIOLOGY I independently reviewed imaging, my interpretation of imaging: CT head shows no signs of intracranial hemorrhage or infarction.  MRA read as no acute findings    ED Results / Procedures / Treatments   Labs (all labs ordered are listed, but only abnormal results are displayed) Labs interpreted as -  Troponin negative.  No significant electrolyte abnormalities.  Low risk Wells criteria low suspicion for PE.  Labs Reviewed  COMPREHENSIVE METABOLIC PANEL - Abnormal; Notable for the following components:      Result Value   Glucose, Bld 116 (*)    Creatinine, Ser 1.06 (*)    Calcium 8.7 (*)    GFR, Estimated 57 (*)    All other components within normal limits  CBC WITH DIFFERENTIAL/PLATELET  URINALYSIS, ROUTINE W REFLEX MICROSCOPIC  TROPONIN I (HIGH SENSITIVITY)    Orthostatic blood pressures are negative.  Sarah given 1 L of IV fluids.  No  recent illness have a low suspicion for vestibular neuritis.  Concern for possible peripheral vertigo.  Ambulating in the emergency department without significant difficulties.  Sarah will follow-up as an outpatient with ENT and her primary care physician.  Given return precautions for any worsening symptoms.   PROCEDURES:  Critical Care performed: No  Procedures  Sarah's presentation is most consistent with acute presentation with potential threat to life or bodily function.   MEDICATIONS ORDERED IN ED: Medications  gadobutrol (GADAVIST) 1 MMOL/ML injection 10 mL (10 mLs Intravenous Contrast Given 02/20/22 1836)  sodium chloride 0.9 % bolus 1,000 mL (1,000 mLs Intravenous New Bag/Given 02/20/22 1924)    FINAL CLINICAL IMPRESSION(S) / ED DIAGNOSES   Final diagnoses:  Chest pain, unspecified type  Dizzy     Rx / DC Orders   ED Discharge Orders          Ordered    meclizine (ANTIVERT) 25 MG tablet  3 times daily PRN        02/20/22 1949             Note:  This document was prepared using Dragon voice recognition software and may include unintentional dictation errors.   Nathaniel Man, MD 02/20/22 [phone]

## 2022-02-20 NOTE — ED Triage Notes (Signed)
Pt has been having dizziness x4 days- chest pain that started today on the R side- pt states it is worse with exertion- pt is having L sided head aches- pt also has L jaw pain- pt has not started taking her baclofen that she was prescribed last month

## 2022-02-22 ENCOUNTER — Encounter: Payer: Self-pay | Admitting: Family Medicine

## 2022-02-23 ENCOUNTER — Encounter: Payer: Self-pay | Admitting: Physician Assistant

## 2022-02-23 ENCOUNTER — Ambulatory Visit (INDEPENDENT_AMBULATORY_CARE_PROVIDER_SITE_OTHER): Payer: Medicare HMO | Admitting: Physician Assistant

## 2022-02-23 VITALS — BP 110/76 | HR 93 | Temp 98.3°F | Resp 16 | Ht 64.0 in | Wt 272.7 lb

## 2022-02-23 DIAGNOSIS — R42 Dizziness and giddiness: Secondary | ICD-10-CM | POA: Insufficient documentation

## 2022-02-23 MED ORDER — SCOPOLAMINE 1 MG/3DAYS TD PT72: 1.0000 | MEDICATED_PATCH | TRANSDERMAL | 1 refills | Status: DC

## 2022-02-23 NOTE — Patient Instructions (Addendum)
For your dizziness you can continue taking the Meclizine   I am also sending in Scopolamine patches or seasickness patches, do not mix this with the Meclizine as that could make you more tired and likely to fall   Please be sure to stay well hydrated and do the epley maneuver in the middle of a bed or similar space where you can lay down if you become dizzy or faint  It was nice to meet you and I appreciate the opportunity to be involved in your care If you were satisfied with the care you received from me, I would greatly appreciate you saying so in the after-visit survey that is sent out following our visit.

## 2022-02-23 NOTE — Progress Notes (Signed)
Established Patient Office Visit  Name: Sarah Gordon   MRN: [id number]    DOB: Sep 13, 1950   Date:02/23/2022  Today's Provider: Jacquelin Hawking, MHS, PA-C Introduced myself to the patient as a PA-C and provided education on APPs in clinical practice.         Subjective  Chief Complaint  Chief Complaint  Patient presents with   Follow-up    ER Follow up- Dizziness pt states much better but had a few episodes after ER.    HPI   ED visit for dizziness and chest pain on 02/20/22 Reviewed EKG and troponin results- negative for signs of STEMI/NSTEMI  Head CT did not show evidence of acute hemorrhage or infarction  MRA of head and neck was normal, MRI normal as well.   Reports she went to the ED because of a bad dizzy spell that was accompanied by chest pain Reports she feels like she is moving or pitching forward when she gets dizzy She tried taking the Meclizine once and found it helpful for her dizziness Reports her husband and daughter have Meniere's disease      Patient Active Problem List   Diagnosis Date Noted   Dizziness 02/23/2022   Cognitive complaints 10/27/2021   Polyp of colon    Lower abdominal pain    Lumbar radiculopathy 11/06/2020   Ventricular flutter (HCC) 12/06/2018   MDD (major depressive disorder), recurrent episode, mild (HCC) 09/04/2018   DDD (degenerative disc disease), cervical 01/23/2016   Degenerative joint disease of left acromioclavicular joint 01/23/2016   Recurrent oral herpes simplex infection 09/01/2015   Irritable bowel syndrome with diarrhea 08/07/2015   Statin intolerance 02/28/2015   Asthma, intermittent 09/10/2014   Controlled type 2 diabetes with neuropathy (HCC) 09/10/2014   Fibromyalgia 09/10/2014   Obstructive sleep apnea 09/10/2014   Hyperlipidemia 12/29/2012   Essential hypertension 12/29/2012   Morbid obesity (HCC) 12/29/2012   Arthritis due to gout 12/13/2007   Acid reflux 10/24/2006   Adult hypothyroidism 10/24/2006    Localized osteoarthrosis 10/24/2006    Past Surgical History:  Procedure Laterality Date   BREAST BIOPSY Left    benign   COLONOSCOPY     Dr Thurmond Butts   COLONOSCOPY WITH PROPOFOL N/A 11/19/2015   Procedure: COLONOSCOPY WITH PROPOFOL;  Surgeon: Kieth Brightly, MD;  Location: [facility] ENDOSCOPY;  Service: Endoscopy;  Laterality: N/A;   COLONOSCOPY WITH PROPOFOL N/A 06/18/2021   Procedure: COLONOSCOPY WITH PROPOFOL;  Surgeon: Toney Reil, MD;  Location: [facility] ENDOSCOPY;  Service: Gastroenterology;  Laterality: N/A;   ESOPHAGOGASTRODUODENOSCOPY N/A 08/20/2014   Procedure: ESOPHAGOGASTRODUODENOSCOPY (EGD);  Surgeon: Kieth Brightly, MD;  Location: [facility] ENDOSCOPY;  Service: Endoscopy;  Laterality: N/A;   ESOPHAGOGASTRODUODENOSCOPY (EGD) WITH PROPOFOL N/A 06/18/2021   Procedure: ESOPHAGOGASTRODUODENOSCOPY (EGD) WITH PROPOFOL;  Surgeon: Toney Reil, MD;  Location: [facility] ENDOSCOPY;  Service: Gastroenterology;  Laterality: N/A;   FOOT SURGERY Left    heel spur   HERNIA REPAIR     umbilical   NASAL SINUS SURGERY  1980s   UMBILICAL HERNIA REPAIR      Family History  Problem Relation Age of Onset   Hypertension Mother    Hyperlipidemia Mother    Diabetes Mother    Heart disease Mother    Stroke Mother    Varicose Veins Mother    Heart disease Brother        CABG x 3    Heart attack Brother 30   Hyperlipidemia Brother  Hypertension Brother    Lupus Sister    Arthritis Sister    Depression Sister    Obesity Sister    Asthma Sister    Depression Sister    Hypertension Sister    Miscarriages / Stillbirths Sister    Varicose Veins Sister    Depression Sister    Arthritis Sister    Heart disease Sister    Hyperlipidemia Sister    Hypertension Sister    Miscarriages / Stillbirths Sister    Varicose Veins Sister    Diabetes Brother    Hypertension Brother    Obesity Brother    Asthma Brother    Depression Brother    Hyperlipidemia Brother    Early death  Brother    Miscarriages / Stillbirths Sister    Arthritis Sister    Depression Sister    Heart disease Sister    Hyperlipidemia Sister    Stroke Maternal Grandfather    Arthritis Brother    Depression Brother    Hyperlipidemia Brother    Hypertension Brother    Breast cancer Neg Hx     Social History   Tobacco Use   Smoking status: Never   Smokeless tobacco: Never  Substance Use Topics   Alcohol use: No    Comment: RARELY     Current Outpatient Medications:    acetaminophen (TYLENOL) 650 MG CR tablet, Take 1,300 mg by mouth 2 (two) times daily., Disp: , Rfl:    albuterol (VENTOLIN HFA) 108 (90 Base) MCG/ACT inhaler, Inhale 2 puffs into the lungs every 6 (six) hours as needed for wheezing or shortness of breath., Disp: 1 each, Rfl: 2   Alirocumab (PRALUENT) 75 MG/ML SOAJ, INJECT 75MG  INTO THE SKIN EVERY 14 DAYS, Disp: 6 mL, Rfl: 1   DULoxetine (CYMBALTA) 60 MG capsule, Take 1 capsule (60 mg total) by mouth daily., Disp: 90 capsule, Rfl: 1   ezetimibe (ZETIA) 10 MG tablet, Take 1 tablet (10 mg total) by mouth daily., Disp: 90 tablet, Rfl: 1   Fluticasone Propionate, Inhal, 100 MCG/ACT AEPB, Inhale 2 puffs into the lungs 2 (two) times daily., Disp: 60 each, Rfl: 1   hydrOXYzine (ATARAX) 10 MG tablet, Take 1 tablet (10 mg total) by mouth 3 (three) times daily as needed., Disp: 30 tablet, Rfl: 0   levothyroxine (SYNTHROID) 100 MCG tablet, TAKE 1 TABLET(100 MCG) BY MOUTH DAILY, Disp: 90 tablet, Rfl: 1   meclizine (ANTIVERT) 25 MG tablet, Take 1 tablet (25 mg total) by mouth 3 (three) times daily as needed for dizziness., Disp: 30 tablet, Rfl: 0   metoprolol succinate (TOPROL-XL) 25 MG 24 hr tablet, Take 1 tablet (25 mg total) by mouth daily., Disp: 90 tablet, Rfl: 1   montelukast (SINGULAIR) 10 MG tablet, Take 1 tablet (10 mg total) by mouth at bedtime., Disp: 90 tablet, Rfl: 1   omeprazole (PRILOSEC) 20 MG capsule, TAKE ONE CAPSULE BY MOUTH DAILY, Disp: 90 capsule, Rfl: 0    QUEtiapine (SEROQUEL) 25 MG tablet, Take 1 tablet (25 mg total) by mouth at bedtime., Disp: 90 tablet, Rfl: 0   scopolamine (TRANSDERM-SCOP) 1 MG/3DAYS, Place 1 patch (1.5 mg total) onto the skin every 3 (three) days., Disp: 10 patch, Rfl: 1   traMADol (ULTRAM) 50 MG tablet, Take 50 mg by mouth daily as needed., Disp: , Rfl:    triamcinolone (KENALOG) 0.025 % cream, Apply 1 application. topically 2 (two) times daily., Disp: 454 g, Rfl: 0   valsartan-hydrochlorothiazide (DIOVAN-HCT) 160-25 MG tablet, Take 1  tablet by mouth daily., Disp: 90 tablet, Rfl: 1   baclofen (LIORESAL) 10 MG tablet, Take baclofen 10mg  as needed for headache. Do not take with tramadol. (Patient not taking: Reported on 02/23/2022), Disp: , Rfl:   Allergies  Allergen Reactions   Vascepa [Icosapent Ethyl] Hives and Swelling   Aspir-81 [Aspirin] Other (See Comments)    Coagulation disorder   Gabapentin Other (See Comments)    Cognitive/memory issues   Statins Other (See Comments)   Topamax [Topiramate] Other (See Comments)    Cognitive issues    Voltaren [Diclofenac Sodium] Hives and Other (See Comments)   Lipitor [Atorvastatin]     Joint pain    I personally reviewed active problem list, medication list, allergies, notes from last encounter, lab results, imaging with the patient/caregiver today.   Review of Systems  Neurological:  Positive for dizziness. Negative for headaches.      Objective  Vitals:   02/23/22 0936  BP: 110/76  Pulse: 93  Resp: 16  Temp: 98.3 F (36.8 C)  TempSrc: Oral  SpO2: 96%  Weight: 272 lb 11.2 oz (123.7 kg)  Height: 5\' 4"  (1.626 m)    Body mass index is 46.81 kg/m.  Physical Exam Vitals reviewed.  Constitutional:      General: She is awake.     Appearance: Normal appearance. She is well-developed and well-groomed.  HENT:     Head: Normocephalic and atraumatic.     Right Ear: Tympanic membrane, ear canal and external ear normal.     Left Ear: Tympanic membrane, ear  canal and external ear normal.     Mouth/Throat:     Lips: Pink.     Mouth: Mucous membranes are moist.     Pharynx: Oropharynx is clear. Uvula midline. No oropharyngeal exudate or posterior oropharyngeal erythema.  Neck:     Vascular: No carotid bruit.     Trachea: Phonation normal.  Cardiovascular:     Rate and Rhythm: Normal rate and regular rhythm.     Pulses: Normal pulses.          Radial pulses are 2+ on the right side and 2+ on the left side.     Heart sounds: Normal heart sounds. No murmur heard.    No friction rub. No gallop.  Pulmonary:     Effort: Pulmonary effort is normal.     Breath sounds: Normal breath sounds. No decreased air movement. No decreased breath sounds, wheezing, rhonchi or rales.  Musculoskeletal:     Cervical back: Normal range of motion and neck supple.     Right lower leg: No edema.     Left lower leg: No edema.  Neurological:     Mental Status: She is alert.  Psychiatric:        Behavior: Behavior is cooperative.      Recent Results (from the past 2160 hour(s))  Comprehensive metabolic panel     Status: Abnormal   Collection Time: 02/20/22 12:58 PM  Result Value Ref Range   Sodium 139 135 - 145 mmol/L   Potassium 4.0 3.5 - 5.1 mmol/L   Chloride 109 98 - 111 mmol/L   CO2 25 22 - 32 mmol/L   Glucose, Bld 116 (H) 70 - 99 mg/dL    Comment: Glucose reference range applies only to samples taken after fasting for at least 8 hours.   BUN 23 8 - 23 mg/dL   Creatinine, Ser 2.53 (H) 0.44 - 1.00 mg/dL   Calcium 8.7 (L) 8.9 -  10.3 mg/dL   Total Protein 6.8 6.5 - 8.1 g/dL   Albumin 3.6 3.5 - 5.0 g/dL   AST 27 15 - 41 U/L   ALT 25 0 - 44 U/L   Alkaline Phosphatase 70 38 - 126 U/L   Total Bilirubin 0.4 0.3 - 1.2 mg/dL   GFR, Estimated 57 (L) >60 mL/min    Comment: (NOTE) Calculated using the CKD-EPI Creatinine Equation (2021)    Anion gap 5 5 - 15    Comment: Performed at [facility], [address], [place], [place] [address]   Troponin I (High Sensitivity)     Status: None   Collection Time: 02/20/22 12:58 PM  Result Value Ref Range   Troponin I (High Sensitivity) 4 <18 ng/L    Comment: (NOTE) Elevated high sensitivity troponin I (hsTnI) values and significant  changes across serial measurements may suggest ACS but many other  chronic and acute conditions are known to elevate hsTnI results.  Refer to the "Links" section for chest pain algorithms and additional  guidance. Performed at [facility], [address] [place], [place] [address]   CBC with Differential     Status: None   Collection Time: 02/20/22 12:58 PM  Result Value Ref Range   WBC 4.3 4.0 - 10.5 K/uL   RBC 4.14 3.87 - 5.11 MIL/uL   Hemoglobin 12.9 12.0 - 15.0 g/dL   HCT 84.1 32.4 - 40.1 %   MCV 94.4 80.0 - 100.0 fL   MCH 31.2 26.0 - 34.0 pg   MCHC 33.0 30.0 - 36.0 g/dL   RDW 02.7 25.3 - 66.4 %   Platelets 257 150 - 400 K/uL   nRBC 0.0 0.0 - 0.2 %   Neutrophils Relative % 66 %   Neutro Abs 2.9 1.7 - 7.7 K/uL   Lymphocytes Relative 24 %   Lymphs Abs 1.0 0.7 - 4.0 K/uL   Monocytes Relative 7 %   Monocytes Absolute 0.3 0.1 - 1.0 K/uL   Eosinophils Relative 2 %   Eosinophils Absolute 0.1 0.0 - 0.5 K/uL   Basophils Relative 1 %   Basophils Absolute 0.0 0.0 - 0.1 K/uL   Immature Granulocytes 0 %   Abs Immature Granulocytes 0.01 0.00 - 0.07 K/uL    Comment: Performed at [facility], [address] [place], [place] [address]     PHQ2/9:    02/23/2022    9:36 AM 01/21/2022   10:38 AM 10/27/2021    8:23 AM 05/28/2021    8:09 AM 03/31/2021    8:09 AM  Depression screen PHQ 2/9  Decreased Interest 1 0 0 0 0  Down, Depressed, Hopeless 1 1 2  0 0  PHQ - 2 Score 2 1 2  0 0  Altered sleeping 0 0 2 2 0  Tired, decreased energy 0 0 2 1 0  Change in appetite 0 0 0 0 0  Feeling bad or failure about yourself  0 0 1 0 0  Trouble concentrating 0 0 0 0 0  Moving slowly or fidgety/restless 0 0 0 0 0  Suicidal thoughts 0 0 0 0  0  PHQ-9 Score 2 1 7 3  0  Difficult doing work/chores Not difficult at all Not difficult at all         Fall Risk:    02/23/2022    9:36 AM 01/21/2022   10:38 AM 10/27/2021    8:23 AM 05/28/2021    8:12 AM 03/31/2021    8:09  AM  Fall Risk   Falls in the past year? 1 0 0 1 1  Number falls in past yr: 1 0 0 1 1  Injury with Fall? 0 0 0 1 1  Risk for fall due to : Impaired balance/gait Impaired balance/gait;Impaired mobility No Fall Risks History of fall(s) No Fall Risks  Follow up Falls prevention discussed;Education provided;Falls evaluation completed  Falls prevention discussed Falls prevention discussed Falls prevention discussed      Functional Status Survey: Is the patient deaf or have difficulty hearing?: No Does the patient have difficulty seeing, even when wearing glasses/contacts?: No Does the patient have difficulty concentrating, remembering, or making decisions?: Yes Does the patient have difficulty walking or climbing stairs?: Yes Does the patient have difficulty dressing or bathing?: No Does the patient have difficulty doing errands alone such as visiting a doctor's office or shopping?: No    Assessment & Plan  Problem List Items Addressed This Visit       Other   Dizziness - Primary    Acute, new concern Reports dizziness and feeling like she is off balance Reviewed ED visit notes, labs, and imaging- all negative at the time Suspect she may have vertigo at this time Reviewed Epley maneuver, Meclizine and scopolamine to assist with symptoms Do not recommend she combine using meclizine and Scopolamine patches at same time to avoid sedation and increased fall risk She is trying to be seen with ENT but they have not yet returned her phone call for scheduling- she has planned to reach out again at a later time Follow up with ENT encouraged to discuss further management options       Relevant Medications   scopolamine (TRANSDERM-SCOP) 1 MG/3DAYS     No  follow-ups on file.   I, Presten Joost E Andriana Casa, PA-C, have reviewed all documentation for this visit. The documentation on 02/23/22 for the exam, diagnosis, procedures, and orders are all accurate and complete.   Jacquelin Hawking, MHS, PA-C [facility]

## 2022-02-23 NOTE — Assessment & Plan Note (Signed)
Acute, new concern Reports dizziness and feeling like she is off balance Reviewed ED visit notes, labs, and imaging- all negative at the time Suspect she may have vertigo at this time Reviewed Epley maneuver, Meclizine and scopolamine to assist with symptoms Do not recommend she combine using meclizine and Scopolamine patches at same time to avoid sedation and increased fall risk She is trying to be seen with ENT but they have not yet returned her phone call for scheduling- she has planned to reach out again at a later time Follow up with ENT encouraged to discuss further management options

## 2022-02-26 ENCOUNTER — Ambulatory Visit: Payer: Medicare HMO | Admitting: Family Medicine

## 2022-03-04 ENCOUNTER — Other Ambulatory Visit: Payer: Self-pay | Admitting: Family Medicine

## 2022-03-04 DIAGNOSIS — J4540 Moderate persistent asthma, uncomplicated: Secondary | ICD-10-CM

## 2022-03-09 NOTE — Progress Notes (Signed)
MRA to evaluate for central vertigo from CVA and possible dissection.

## 2022-03-13 ENCOUNTER — Encounter: Payer: Self-pay | Admitting: Family Medicine

## 2022-03-13 ENCOUNTER — Telehealth: Payer: Medicare HMO | Admitting: Urgent Care

## 2022-03-13 DIAGNOSIS — U071 COVID-19: Secondary | ICD-10-CM | POA: Diagnosis not present

## 2022-03-13 DIAGNOSIS — Z91199 Patient's noncompliance with other medical treatment and regimen due to unspecified reason: Secondary | ICD-10-CM

## 2022-03-13 MED ORDER — MOLNUPIRAVIR EUA 200MG CAPSULE: 4.0000 | ORAL_CAPSULE | Freq: Two times a day (BID) | ORAL | 0 refills | Status: AC

## 2022-03-13 NOTE — Progress Notes (Signed)
Virtual Visit Consent   Sarah Gordon, you are scheduled for a virtual visit with a [facility] provider today. Just as with appointments in the office, your consent must be obtained to participate. Your consent will be active for this visit and any virtual visit you may have with one of our providers in the next 365 days. If you have a MyChart account, a copy of this consent can be sent to you electronically.  As this is a virtual visit, video technology does not allow for your provider to perform a traditional examination. This may limit your provider's ability to fully assess your condition. If your provider identifies any concerns that need to be evaluated in person or the need to arrange testing (such as labs, EKG, etc.), we will make arrangements to do so. Although advances in technology are sophisticated, we cannot ensure that it will always work on either your end or our end. If the connection with a video visit is poor, the visit may have to be switched to a telephone visit. With either a video or telephone visit, we are not always able to ensure that we have a secure connection.  By engaging in this virtual visit, you consent to the provision of healthcare and authorize for your insurance to be billed (if applicable) for the services provided during this visit. Depending on your insurance coverage, you may receive a charge related to this service.  I need to obtain your verbal consent now. Are you willing to proceed with your visit today? Sarah Gordon has provided verbal consent on 03/13/2022 for a virtual visit (video or telephone). Chaney Malling, PA  Date: 03/13/2022 6:24 PM  Virtual Visit via Video Note   I, Sarah Gordon, connected with  Sarah Gordon  ([id number], 09-Jan-1951) on 03/13/22 at  6:00 PM EST by a video-enabled telemedicine application and verified that I am speaking with the correct person using two identifiers.  Location: Patient: Virtual Visit Location Patient:  Home Provider: Virtual Visit Location Provider: Home Office   I discussed the limitations of evaluation and management by telemedicine and the availability of in person appointments. The patient expressed understanding and agreed to proceed.    History of Present Illness: Sarah Gordon is a 71 y.o. who identifies as a female who was assigned female at birth, and is being seen today for covid.  HPI: 71yo female presents today after having a positive covid test this morning. She started having nasal congestion, chest congestion, cough and mucous production two days ago, today is the third day. Husband also sick with similar sx, was started of paxlovid and seems to be getting better. Pt reports hx of chronic lung issues. Denies any SOB, CP, palps or fever. Has taken OTC meds with minimal relief.    Problems:  Patient Active Problem List   Diagnosis Date Noted   Dizziness 02/23/2022   Cognitive complaints 10/27/2021   Polyp of colon    Lower abdominal pain    Lumbar radiculopathy 11/06/2020   Ventricular flutter ([place]) 12/06/2018   MDD (major depressive disorder), recurrent episode, mild ([place]) 09/04/2018   DDD (degenerative disc disease), cervical 01/23/2016   Degenerative joint disease of left acromioclavicular joint 01/23/2016   Recurrent oral herpes simplex infection 09/01/2015   Irritable bowel syndrome with diarrhea 08/07/2015   Statin intolerance 02/28/2015   Asthma, intermittent 09/10/2014   Controlled type 2 diabetes with neuropathy ([place]) 09/10/2014   Fibromyalgia 09/10/2014   Obstructive sleep apnea 09/10/2014  Hyperlipidemia 12/29/2012   Essential hypertension 12/29/2012   Morbid obesity ([place]) 12/29/2012   Arthritis due to gout 12/13/2007   Acid reflux 10/24/2006   Adult hypothyroidism 10/24/2006   Localized osteoarthrosis 10/24/2006    Allergies:  Allergies  Allergen Reactions   Vascepa [Icosapent Ethyl] Hives and Swelling   Aspir-81 [Aspirin] Other (See Comments)     Coagulation disorder   Gabapentin Other (See Comments)    Cognitive/memory issues   Statins Other (See Comments)   Topamax [Topiramate] Other (See Comments)    Cognitive issues    Voltaren [Diclofenac Sodium] Hives and Other (See Comments)   Lipitor [Atorvastatin]     Joint pain   Medications:  Current Outpatient Medications:    molnupiravir EUA (LAGEVRIO) 200 mg CAPS capsule, Take 4 capsules (800 mg total) by mouth 2 (two) times daily for 5 days., Disp: 40 capsule, Rfl: 0   acetaminophen (TYLENOL) 650 MG CR tablet, Take 1,300 mg by mouth 2 (two) times daily., Disp: , Rfl:    albuterol (VENTOLIN HFA) 108 (90 Base) MCG/ACT inhaler, Inhale 2 puffs into the lungs every 6 (six) hours as needed for wheezing or shortness of breath., Disp: 1 each, Rfl: 2   Alirocumab (PRALUENT) 75 MG/ML SOAJ, INJECT '75MG'$  INTO THE SKIN EVERY 14 DAYS, Disp: 6 mL, Rfl: 1   baclofen (LIORESAL) 10 MG tablet, Take baclofen '10mg'$  as needed for headache. Do not take with tramadol. (Patient not taking: Reported on 02/23/2022), Disp: , Rfl:    DULoxetine (CYMBALTA) 60 MG capsule, Take 1 capsule (60 mg total) by mouth daily., Disp: 90 capsule, Rfl: 1   ezetimibe (ZETIA) 10 MG tablet, Take 1 tablet (10 mg total) by mouth daily., Disp: 90 tablet, Rfl: 1   FLOVENT DISKUS 100 MCG/ACT AEPB, INHALE 2 PUFFS BY MOUTH TWICE A DAY, Disp: 60 each, Rfl: 1   hydrOXYzine (ATARAX) 10 MG tablet, Take 1 tablet (10 mg total) by mouth 3 (three) times daily as needed., Disp: 30 tablet, Rfl: 0   levothyroxine (SYNTHROID) 100 MCG tablet, TAKE 1 TABLET(100 MCG) BY MOUTH DAILY, Disp: 90 tablet, Rfl: 1   meclizine (ANTIVERT) 25 MG tablet, Take 1 tablet (25 mg total) by mouth 3 (three) times daily as needed for dizziness., Disp: 30 tablet, Rfl: 0   metoprolol succinate (TOPROL-XL) 25 MG 24 hr tablet, Take 1 tablet (25 mg total) by mouth daily., Disp: 90 tablet, Rfl: 1   montelukast (SINGULAIR) 10 MG tablet, Take 1 tablet (10 mg total) by mouth at  bedtime., Disp: 90 tablet, Rfl: 1   omeprazole (PRILOSEC) 20 MG capsule, TAKE ONE CAPSULE BY MOUTH DAILY, Disp: 90 capsule, Rfl: 0   QUEtiapine (SEROQUEL) 25 MG tablet, Take 1 tablet (25 mg total) by mouth at bedtime., Disp: 90 tablet, Rfl: 0   scopolamine (TRANSDERM-SCOP) 1 MG/3DAYS, Place 1 patch (1.5 mg total) onto the skin every 3 (three) days., Disp: 10 patch, Rfl: 1   traMADol (ULTRAM) 50 MG tablet, Take 50 mg by mouth daily as needed., Disp: , Rfl:    triamcinolone (KENALOG) 0.025 % cream, Apply 1 application. topically 2 (two) times daily., Disp: 454 g, Rfl: 0   valsartan-hydrochlorothiazide (DIOVAN-HCT) 160-25 MG tablet, Take 1 tablet by mouth daily., Disp: 90 tablet, Rfl: 1  Observations/Objective: Patient is well-developed, well-nourished in no acute distress.  Resting comfortably at home. Non-toxic, appears well Head is normocephalic, atraumatic.  No labored breathing. Minimal dry cough Speech is clear and coherent with logical content.  Patient is alert and  oriented at baseline.  No rhinorrhea or scleral injection.  Assessment and Plan: 1. COVID-19  Pt is on day three of covid, risk factors including HLD, age, obesity and chronic pulmonary disease. She is requesting to start the antiviral, husband currently taking and doing better. Risks, benefits and side effects reviewed. Pt taking seroquel daily therefore cannot take paxlovid.  Follow Up Instructions: I discussed the assessment and treatment plan with the patient. The patient was provided an opportunity to ask questions and all were answered. The patient agreed with the plan and demonstrated an understanding of the instructions.  A copy of instructions were sent to the patient via MyChart unless otherwise noted below.    The patient was advised to call back or seek an in-person evaluation if the symptoms worsen or if the condition fails to improve as anticipated.  Time:  I spent 8 minutes with the patient via telehealth  technology discussing the above problems/concerns.    [place], PA

## 2022-03-13 NOTE — Patient Instructions (Signed)
The patient no-showed for appointment despite this provider sending direct link, reaching out via phone with no response and waiting for at least 10 minutes from appointment time for patient to join. They will be marked as a NS for this appointment/time.   [place], PA

## 2022-03-13 NOTE — Progress Notes (Signed)
The patient no-showed for appointment despite this provider sending direct link, reaching out via phone with no response and waiting for at least 10 minutes from appointment time for patient to join. They will be marked as a NS for this appointment/time.   [place], PA

## 2022-03-13 NOTE — Patient Instructions (Signed)
Sarah Gordon, thank you for joining Chaney Malling, PA for today's virtual visit.  While this provider is not your primary care provider (PCP), if your PCP is located in our provider database this encounter information will be shared with them immediately following your visit.   [facility] account gives you access to today's visit and all your visits, tests, and labs performed at [facility] " click here if you don't have a [facility] account or go to [email or web address]  Consent: (Patient) Sarah Gordon provided verbal consent for this virtual visit at the beginning of the encounter.  Current Medications:  Current Outpatient Medications:    molnupiravir EUA (LAGEVRIO) 200 mg CAPS capsule, Take 4 capsules (800 mg total) by mouth 2 (two) times daily for 5 days., Disp: 40 capsule, Rfl: 0   acetaminophen (TYLENOL) 650 MG CR tablet, Take 1,300 mg by mouth 2 (two) times daily., Disp: , Rfl:    albuterol (VENTOLIN HFA) 108 (90 Base) MCG/ACT inhaler, Inhale 2 puffs into the lungs every 6 (six) hours as needed for wheezing or shortness of breath., Disp: 1 each, Rfl: 2   Alirocumab (PRALUENT) 75 MG/ML SOAJ, INJECT '75MG'$  INTO THE SKIN EVERY 14 DAYS, Disp: 6 mL, Rfl: 1   baclofen (LIORESAL) 10 MG tablet, Take baclofen '10mg'$  as needed for headache. Do not take with tramadol. (Patient not taking: Reported on 02/23/2022), Disp: , Rfl:    DULoxetine (CYMBALTA) 60 MG capsule, Take 1 capsule (60 mg total) by mouth daily., Disp: 90 capsule, Rfl: 1   ezetimibe (ZETIA) 10 MG tablet, Take 1 tablet (10 mg total) by mouth daily., Disp: 90 tablet, Rfl: 1   FLOVENT DISKUS 100 MCG/ACT AEPB, INHALE 2 PUFFS BY MOUTH TWICE A DAY, Disp: 60 each, Rfl: 1   hydrOXYzine (ATARAX) 10 MG tablet, Take 1 tablet (10 mg total) by mouth 3 (three) times daily as needed., Disp: 30 tablet, Rfl: 0   levothyroxine (SYNTHROID) 100 MCG tablet, TAKE 1 TABLET(100 MCG) BY MOUTH DAILY, Disp: 90 tablet, Rfl: 1    meclizine (ANTIVERT) 25 MG tablet, Take 1 tablet (25 mg total) by mouth 3 (three) times daily as needed for dizziness., Disp: 30 tablet, Rfl: 0   metoprolol succinate (TOPROL-XL) 25 MG 24 hr tablet, Take 1 tablet (25 mg total) by mouth daily., Disp: 90 tablet, Rfl: 1   montelukast (SINGULAIR) 10 MG tablet, Take 1 tablet (10 mg total) by mouth at bedtime., Disp: 90 tablet, Rfl: 1   omeprazole (PRILOSEC) 20 MG capsule, TAKE ONE CAPSULE BY MOUTH DAILY, Disp: 90 capsule, Rfl: 0   QUEtiapine (SEROQUEL) 25 MG tablet, Take 1 tablet (25 mg total) by mouth at bedtime., Disp: 90 tablet, Rfl: 0   scopolamine (TRANSDERM-SCOP) 1 MG/3DAYS, Place 1 patch (1.5 mg total) onto the skin every 3 (three) days., Disp: 10 patch, Rfl: 1   traMADol (ULTRAM) 50 MG tablet, Take 50 mg by mouth daily as needed., Disp: , Rfl:    triamcinolone (KENALOG) 0.025 % cream, Apply 1 application. topically 2 (two) times daily., Disp: 454 g, Rfl: 0   valsartan-hydrochlorothiazide (DIOVAN-HCT) 160-25 MG tablet, Take 1 tablet by mouth daily., Disp: 90 tablet, Rfl: 1   Medications ordered in this encounter:  Meds ordered this encounter  Medications   molnupiravir EUA (LAGEVRIO) 200 mg CAPS capsule    Sig: Take 4 capsules (800 mg total) by mouth 2 (two) times daily for 5 days.    Dispense:  40 capsule  Refill:  0    Order Specific Question:   Supervising Provider    Answer:   Chase Picket [[id number]]     *If you need refills on other medications prior to your next appointment, please contact your pharmacy*  Follow-Up: Call back or seek an in-person evaluation if the symptoms worsen or if the condition fails to improve as anticipated.  [facility] [phone]  Other Instructions You are positive for covid. Start taking the [employer]. Take 4 pills twice daily until gone. Please monitor regression of your symptoms. Some people have tried OTC Quercetin to help fight off illness. If any new or  worsening symptoms develops, particularly uncontrollable fever, severe shortness of breath or chest pain, please head to the ER.    If you have been instructed to have an in-person evaluation today at a local Urgent Care facility, please use the link below. It will take you to a list of all of our available [facility], including address, phone number and hours of operation. Please do not delay care.  [facility]  If you or a family member do not have a primary care provider, use the link below to schedule a visit and establish care. When you choose a [facility] primary care physician or advanced practice provider, you gain a long-term partner in health. Find a Primary Care Provider  Learn more about [facility]'s in-office and virtual care options: [facility]

## 2022-03-23 ENCOUNTER — Encounter: Payer: Self-pay | Admitting: Family Medicine

## 2022-03-23 ENCOUNTER — Other Ambulatory Visit: Payer: Self-pay | Admitting: Family Medicine

## 2022-03-23 DIAGNOSIS — G4733 Obstructive sleep apnea (adult) (pediatric): Secondary | ICD-10-CM | POA: Diagnosis not present

## 2022-03-23 DIAGNOSIS — E785 Hyperlipidemia, unspecified: Secondary | ICD-10-CM | POA: Diagnosis not present

## 2022-03-23 DIAGNOSIS — M1990 Unspecified osteoarthritis, unspecified site: Secondary | ICD-10-CM | POA: Diagnosis not present

## 2022-03-23 DIAGNOSIS — Z008 Encounter for other general examination: Secondary | ICD-10-CM | POA: Diagnosis not present

## 2022-03-23 DIAGNOSIS — R32 Unspecified urinary incontinence: Secondary | ICD-10-CM | POA: Diagnosis not present

## 2022-03-23 DIAGNOSIS — R69 Illness, unspecified: Secondary | ICD-10-CM | POA: Diagnosis not present

## 2022-03-23 DIAGNOSIS — Z79891 Long term (current) use of opiate analgesic: Secondary | ICD-10-CM | POA: Diagnosis not present

## 2022-03-23 DIAGNOSIS — I2510 Atherosclerotic heart disease of native coronary artery without angina pectoris: Secondary | ICD-10-CM | POA: Diagnosis not present

## 2022-03-23 DIAGNOSIS — I10 Essential (primary) hypertension: Secondary | ICD-10-CM | POA: Diagnosis not present

## 2022-03-23 DIAGNOSIS — F329 Major depressive disorder, single episode, unspecified: Secondary | ICD-10-CM | POA: Diagnosis not present

## 2022-03-23 DIAGNOSIS — J45909 Unspecified asthma, uncomplicated: Secondary | ICD-10-CM | POA: Diagnosis not present

## 2022-03-23 DIAGNOSIS — K219 Gastro-esophageal reflux disease without esophagitis: Secondary | ICD-10-CM | POA: Diagnosis not present

## 2022-03-23 MED ORDER — REPATHA 140 MG/ML ~~LOC~~ SOSY: 1.0000 mL | PREFILLED_SYRINGE | SUBCUTANEOUS | 5 refills | Status: DC

## 2022-04-17 ENCOUNTER — Other Ambulatory Visit: Payer: Self-pay | Admitting: Family Medicine

## 2022-04-17 DIAGNOSIS — F3340 Major depressive disorder, recurrent, in remission, unspecified: Secondary | ICD-10-CM

## 2022-04-22 NOTE — Progress Notes (Signed)
Name: Sarah Gordon   MRN: [id number]    DOB: 1950-03-26   Date:04/23/2022       Progress Note  Subjective  Chief Complaint  Follow Up  HPI  Memory changes: seen by Dr. Manuella Ghazi, had MRI and went to eye doctor and eye pressure normal, had one B12 injection and is now taking SL B12, memory still the same She has significant history of dementia She states symptoms are stable. She will go yearly to monitor   IMPRESSION:  1. Partially empty sella which can be a normal variant but can also  be seen in the setting of intracranial hypertension. Otherwise  normal appearance of the brain.  2. Small left mastoid effusion.   HTN: taking valsartan hctz and Toprol XL and bp today is at goal    GERD: she stopped FODMAP diet, doing well now, taking omeprazole . She saw GI and evaluated for bloating and is doing much better .   Ventricular flutter: seen by cardiologist, medication for thyroid adjusted and feeling well since and taking metoprolol . She will go back to see cardiologist tomorrow.    MDD: she is under more stress, lost a brother in law, her brother that has dementia fell and is not doing well, worried about her own memory loss, still taking duloxetine, seroquel for sleep and sometimes hydroxyzine. She is grieving the death of her brother, her older sister also had a amputation recently    DMII: she was doing well on diet only, however A1C is up over one point at 6.7 % .  Denies polyphagia, polydipsia or polyuria She has a history of neuropathy, very mild symptoms. She is on Zetia  and  Repatha for dyslipidemia, unable to tolerate statin therapy. She is willing to start medication. She took Metformin and had side effects.    DDD lumbar spine: she had MRI done back in 2010 and showed herniated disc, she had steroid injections twice and it helped. Intermittent symptoms that radiates down both legs. Pain right now is 4/10 She takes Tramadol prn    OA: knees and feet, also has FMS, she states her  legs are doing better, except for when radiates down her legs.She uses a walker when she walks longer distances or when back is flaring    Asthma: She has occasional dry cough, SOB and wheezing a couple of times a month . She uses Flovent most days now and seems to be helping    Morbid Obesity: BMI above 40, her weight is trending up, DM showed increase of A1C and willing to start medication    Hypothyroidism: she is taking medication daily, denies hair loss, she has chronic dry skin and chronic constipation Last TSH was at goal   URI: going on for the past few days, rhinorrhea, nasal congestion, no body aches, but has some sore throat and chest congestion. She noticed having to use otc medication to control symptoms   Patient Active Problem List   Diagnosis Date Noted   Cognitive complaints 10/27/2021   Polyp of colon    Lower abdominal pain    Lumbar radiculopathy 11/06/2020   Ventricular flutter ([place]) 12/06/2018   MDD (major depressive disorder), recurrent episode, mild ([place]) 09/04/2018   DDD (degenerative disc disease), cervical 01/23/2016   Degenerative joint disease of left acromioclavicular joint 01/23/2016   Recurrent oral herpes simplex infection 09/01/2015   Irritable bowel syndrome with diarrhea 08/07/2015   Statin intolerance 02/28/2015   Asthma, intermittent 09/10/2014   Controlled  type 2 diabetes with neuropathy ([place]) 09/10/2014   Fibromyalgia 09/10/2014   Obstructive sleep apnea 09/10/2014   Hyperlipidemia 12/29/2012   Essential hypertension 12/29/2012   Morbid obesity ([place]) 12/29/2012   Arthritis due to gout 12/13/2007   Acid reflux 10/24/2006   Adult hypothyroidism 10/24/2006   Localized osteoarthrosis 10/24/2006    Past Surgical History:  Procedure Laterality Date   BREAST BIOPSY Left    benign   COLONOSCOPY     Dr Alveta Heimlich   COLONOSCOPY WITH PROPOFOL N/A 11/19/2015   Procedure: COLONOSCOPY WITH PROPOFOL;  Surgeon: Christene Lye, MD;  Location: [facility]  ENDOSCOPY;  Service: Endoscopy;  Laterality: N/A;   COLONOSCOPY WITH PROPOFOL N/A 06/18/2021   Procedure: COLONOSCOPY WITH PROPOFOL;  Surgeon: Lin Landsman, MD;  Location: [facility] ENDOSCOPY;  Service: Gastroenterology;  Laterality: N/A;   ESOPHAGOGASTRODUODENOSCOPY N/A 08/20/2014   Procedure: ESOPHAGOGASTRODUODENOSCOPY (EGD);  Surgeon: Christene Lye, MD;  Location: [facility] ENDOSCOPY;  Service: Endoscopy;  Laterality: N/A;   ESOPHAGOGASTRODUODENOSCOPY (EGD) WITH PROPOFOL N/A 06/18/2021   Procedure: ESOPHAGOGASTRODUODENOSCOPY (EGD) WITH PROPOFOL;  Surgeon: Lin Landsman, MD;  Location: [facility] ENDOSCOPY;  Service: Gastroenterology;  Laterality: N/A;   FOOT SURGERY Left    heel spur   HERNIA REPAIR     umbilical   NASAL SINUS SURGERY  0865H   UMBILICAL HERNIA REPAIR      Family History  Problem Relation Age of Onset   Hypertension Mother    Hyperlipidemia Mother    Diabetes Mother    Heart disease Mother    Stroke Mother    Varicose Veins Mother    Lupus Sister    Arthritis Sister    Depression Sister    Obesity Sister    Asthma Sister    Depression Sister    Hypertension Sister    82 / Stillbirths Sister    Varicose Veins Sister    Depression Sister    Arthritis Sister    Heart disease Sister    Hyperlipidemia Sister    Hypertension Sister    18 / Stillbirths Sister    Varicose Veins Sister    Miscarriages / Stillbirths Sister    Arthritis Sister    Depression Sister    Heart disease Sister    Hyperlipidemia Sister    Heart disease Brother        CABG x 3    Heart attack Brother 93   Hyperlipidemia Brother    Hypertension Brother    Diabetes Brother    Hypertension Brother    Obesity Brother    Asthma Brother    Depression Brother    Hyperlipidemia Brother    Early death Brother    Stroke Brother    Arthritis Brother    Depression Brother    Hyperlipidemia Brother    Hypertension Brother    Stroke Maternal Grandfather    Breast  cancer Neg Hx     Social History   Tobacco Use   Smoking status: Never   Smokeless tobacco: Never  Substance Use Topics   Alcohol use: No    Comment: RARELY     Current Outpatient Medications:    acetaminophen (TYLENOL) 650 MG CR tablet, Take 1,300 mg by mouth 2 (two) times daily., Disp: , Rfl:    albuterol (VENTOLIN HFA) 108 (90 Base) MCG/ACT inhaler, Inhale 2 puffs into the lungs every 6 (six) hours as needed for wheezing or shortness of breath., Disp: 1 each, Rfl: 2   baclofen (LIORESAL) 10 MG tablet, , Disp: ,  Rfl:    DULoxetine (CYMBALTA) 60 MG capsule, Take 1 capsule (60 mg total) by mouth daily., Disp: 90 capsule, Rfl: 1   Evolocumab (REPATHA) 140 MG/ML SOSY, Inject 1 mL into the skin every 14 (fourteen) days., Disp: 2.1 mL, Rfl: 5   ezetimibe (ZETIA) 10 MG tablet, Take 1 tablet (10 mg total) by mouth daily., Disp: 90 tablet, Rfl: 1   FLOVENT DISKUS 100 MCG/ACT AEPB, INHALE 2 PUFFS BY MOUTH TWICE A DAY, Disp: 60 each, Rfl: 1   hydrOXYzine (ATARAX) 10 MG tablet, Take 1 tablet (10 mg total) by mouth 3 (three) times daily as needed., Disp: 30 tablet, Rfl: 0   levothyroxine (SYNTHROID) 100 MCG tablet, TAKE 1 TABLET(100 MCG) BY MOUTH DAILY, Disp: 90 tablet, Rfl: 1   meclizine (ANTIVERT) 25 MG tablet, Take 1 tablet (25 mg total) by mouth 3 (three) times daily as needed for dizziness., Disp: 30 tablet, Rfl: 0   metoprolol succinate (TOPROL-XL) 25 MG 24 hr tablet, Take 1 tablet (25 mg total) by mouth daily., Disp: 90 tablet, Rfl: 1   montelukast (SINGULAIR) 10 MG tablet, Take 1 tablet (10 mg total) by mouth at bedtime., Disp: 90 tablet, Rfl: 1   omeprazole (PRILOSEC) 20 MG capsule, TAKE ONE CAPSULE BY MOUTH DAILY, Disp: 90 capsule, Rfl: 0   QUEtiapine (SEROQUEL) 25 MG tablet, TAKE 1 TABLET BY MOUTH AT BEDTIME, Disp: 90 tablet, Rfl: 0   tirzepatide (MOUNJARO) 2.5 MG/0.5ML Pen, Inject 2.5 mg into the skin once a week., Disp: 2 mL, Rfl: 0   tirzepatide (MOUNJARO) 5 MG/0.5ML Pen, Inject 5 mg  into the skin once a week., Disp: 6 mL, Rfl: 0   traMADol (ULTRAM) 50 MG tablet, Take 50 mg by mouth daily as needed., Disp: , Rfl:    valsartan-hydrochlorothiazide (DIOVAN-HCT) 160-25 MG tablet, Take 1 tablet by mouth daily., Disp: 90 tablet, Rfl: 1  Allergies  Allergen Reactions   Vascepa [Icosapent Ethyl] Hives and Swelling   Aspir-81 [Aspirin] Other (See Comments)    Coagulation disorder   Gabapentin Other (See Comments)    Cognitive/memory issues   Statins Other (See Comments)   Topamax [Topiramate] Other (See Comments)    Cognitive issues    Voltaren [Diclofenac Sodium] Hives and Other (See Comments)   Lipitor [Atorvastatin]     Joint pain    I personally reviewed active problem list, medication list, allergies, family history, social history, health maintenance with the patient/caregiver today.   ROS  Constitutional: Negative for fever or weight change.  Respiratory: Negative for cough and shortness of breath.   Cardiovascular: Negative for chest pain or palpitations.  Gastrointestinal: Negative for abdominal pain, no bowel changes.  Musculoskeletal: Negative for gait problem or joint swelling.  Skin: Negative for rash.  Neurological: positive  for intermittent dizziness but no headache.  No other specific complaints in a complete review of systems (except as listed in HPI above).   Objective  Vitals:   04/23/22 1049  BP: 116/74  Pulse: 84  Resp: 16  SpO2: 99%  Weight: 270 lb (122.5 kg)  Height: '5\' 6"'$  (1.676 m)    Body mass index is 43.58 kg/m.  Physical Exam  Constitutional: Patient appears well-developed and well-nourished. Obese  No distress.  HEENT: head atraumatic, normocephalic, pupils equal and reactive to light, neck supple Cardiovascular: Normal rate, regular rhythm and normal heart sounds.  No murmur heard. No BLE edema. Pulmonary/Chest: Effort normal and breath sounds normal. No respiratory distress. Abdominal: Soft.  There is no  tenderness. Psychiatric: Patient has a normal mood and affect. behavior is normal. Judgment and thought content normal.   Recent Results (from the past 2160 hour(s))  Comprehensive metabolic panel     Status: Abnormal   Collection Time: 02/20/22 12:58 PM  Result Value Ref Range   Sodium 139 135 - 145 mmol/L   Potassium 4.0 3.5 - 5.1 mmol/L   Chloride 109 98 - 111 mmol/L   CO2 25 22 - 32 mmol/L   Glucose, Bld 116 (H) 70 - 99 mg/dL    Comment: Glucose reference range applies only to samples taken after fasting for at least 8 hours.   BUN 23 8 - 23 mg/dL   Creatinine, Ser 1.06 (H) 0.44 - 1.00 mg/dL   Calcium 8.7 (L) 8.9 - 10.3 mg/dL   Total Protein 6.8 6.5 - 8.1 g/dL   Albumin 3.6 3.5 - 5.0 g/dL   AST 27 15 - 41 U/L   ALT 25 0 - 44 U/L   Alkaline Phosphatase 70 38 - 126 U/L   Total Bilirubin 0.4 0.3 - 1.2 mg/dL   GFR, Estimated 57 (L) >60 mL/min    Comment: (NOTE) Calculated using the CKD-EPI Creatinine Equation (2021)    Anion gap 5 5 - 15    Comment: Performed at [facility], [address], [place], [place] [address]  Troponin I (High Sensitivity)     Status: None   Collection Time: 02/20/22 12:58 PM  Result Value Ref Range   Troponin I (High Sensitivity) 4 <18 ng/L    Comment: (NOTE) Elevated high sensitivity troponin I (hsTnI) values and significant  changes across serial measurements may suggest ACS but many other  chronic and acute conditions are known to elevate hsTnI results.  Refer to the "Links" section for chest pain algorithms and additional  guidance. Performed at [facility], [address] [place], [place] [address]   CBC with Differential     Status: None   Collection Time: 02/20/22 12:58 PM  Result Value Ref Range   WBC 4.3 4.0 - 10.5 K/uL   RBC 4.14 3.87 - 5.11 MIL/uL   Hemoglobin 12.9 12.0 - 15.0 g/dL   HCT 39.1 36.0 - 46.0 %   MCV 94.4 80.0 - 100.0 fL   MCH 31.2 26.0 - 34.0 pg   MCHC 33.0 30.0 - 36.0 g/dL   RDW 12.8 11.5 -  15.5 %   Platelets 257 150 - 400 K/uL   nRBC 0.0 0.0 - 0.2 %   Neutrophils Relative % 66 %   Neutro Abs 2.9 1.7 - 7.7 K/uL   Lymphocytes Relative 24 %   Lymphs Abs 1.0 0.7 - 4.0 K/uL   Monocytes Relative 7 %   Monocytes Absolute 0.3 0.1 - 1.0 K/uL   Eosinophils Relative 2 %   Eosinophils Absolute 0.1 0.0 - 0.5 K/uL   Basophils Relative 1 %   Basophils Absolute 0.0 0.0 - 0.1 K/uL   Immature Granulocytes 0 %   Abs Immature Granulocytes 0.01 0.00 - 0.07 K/uL    Comment: Performed at [facility], [address] [place], [place] [address]  POCT HgB A1C     Status: Abnormal   Collection Time: 04/23/22 10:49 AM  Result Value Ref Range   Hemoglobin A1C 6.7 (A) 4.0 - 5.6 %   HbA1c POC (<> result, manual entry)     HbA1c, POC (prediabetic range)     HbA1c, POC (controlled diabetic range)  Diabetic Foot Exam: Diabetic Foot Exam - Simple   Simple Foot Form Visual Inspection See comments: Yes Sensation Testing Intact to touch and monofilament testing bilaterally: Yes Pulse Check Posterior Tibialis and Dorsalis pulse intact bilaterally: Yes Comments Flat feet, corn formation      PHQ2/9:    04/23/2022   10:44 AM 02/23/2022    9:36 AM 01/21/2022   10:38 AM 10/27/2021    8:23 AM 05/28/2021    8:09 AM  Depression screen PHQ 2/9  Decreased Interest 0 1 0 0 0  Down, Depressed, Hopeless 0 '1 1 2 '$ 0  PHQ - 2 Score 0 '2 1 2 '$ 0  Altered sleeping 2 0 0 2 2  Tired, decreased energy 0 0 0 2 1  Change in appetite 0 0 0 0 0  Feeling bad or failure about yourself  0 0 0 1 0  Trouble concentrating 0 0 0 0 0  Moving slowly or fidgety/restless 0 0 0 0 0  Suicidal thoughts 0 0 0 0 0  PHQ-9 Score '2 2 1 7 3  '$ Difficult doing work/chores  Not difficult at all Not difficult at all      phq 9 is negative   Fall Risk:    04/23/2022   10:43 AM 02/23/2022    9:36 AM 01/21/2022   10:38 AM 10/27/2021    8:23 AM 05/28/2021    8:12 AM  Fall Risk   Falls in the past year? 0 1 0 0 1  Number  falls in past yr: 0 1 0 0 1  Injury with Fall? 0 0 0 0 1  Risk for fall due to : No Fall Risks Impaired balance/gait Impaired balance/gait;Impaired mobility No Fall Risks History of fall(s)  Follow up Falls prevention discussed Falls prevention discussed;Education provided;Falls evaluation completed  Falls prevention discussed Falls prevention discussed      Functional Status Survey: Is the patient deaf or have difficulty hearing?: Yes Does the patient have difficulty seeing, even when wearing glasses/contacts?: No Does the patient have difficulty concentrating, remembering, or making decisions?: Yes Does the patient have difficulty walking or climbing stairs?: Yes Does the patient have difficulty dressing or bathing?: No Does the patient have difficulty doing errands alone such as visiting a doctor's office or shopping?: No    Assessment & Plan  1. Controlled type 2 diabetes with neuropathy (HCC)  - POCT HgB A1C  2. Ventricular flutter ([place])  Keep follow up with cardiologist   3. Dyslipidemia associated with type 2 diabetes mellitus (HCC)  - HM Diabetes Foot Exam - tirzepatide (MOUNJARO) 2.5 MG/0.5ML Pen; Inject 2.5 mg into the skin once a week.  Dispense: 2 mL; Refill: 0 - tirzepatide (MOUNJARO) 5 MG/0.5ML Pen; Inject 5 mg into the skin once a week.  Dispense: 6 mL; Refill: 0  4. MDD (recurrent major depressive disorder) in remission ([place])  Stable on medication   5. Morbid obesity ([place])  Discussed with the patient the risk posed by an increased BMI. Discussed importance of portion control, calorie counting and at least 150 minutes of physical activity weekly. Avoid sweet beverages and drink more water. Eat at least 6 servings of fruit and vegetables daily    6. Adult hypothyroidism  Continue current dose  7. Essential hypertension  BP at goal   8. OSA (obstructive sleep apnea)  She has not been able to get machine yet  9. Gastroesophageal reflux disease without  esophagitis   10. Moderate persistent asthma without complication  11. Cognitive complaints

## 2022-04-23 ENCOUNTER — Ambulatory Visit (INDEPENDENT_AMBULATORY_CARE_PROVIDER_SITE_OTHER): Payer: Medicare HMO | Admitting: Family Medicine

## 2022-04-23 ENCOUNTER — Encounter: Payer: Self-pay | Admitting: Family Medicine

## 2022-04-23 VITALS — BP 116/74 | HR 84 | Resp 16 | Ht 66.0 in | Wt 270.0 lb

## 2022-04-23 DIAGNOSIS — G4733 Obstructive sleep apnea (adult) (pediatric): Secondary | ICD-10-CM | POA: Diagnosis not present

## 2022-04-23 DIAGNOSIS — E1169 Type 2 diabetes mellitus with other specified complication: Secondary | ICD-10-CM | POA: Diagnosis not present

## 2022-04-23 DIAGNOSIS — I10 Essential (primary) hypertension: Secondary | ICD-10-CM | POA: Diagnosis not present

## 2022-04-23 DIAGNOSIS — F3340 Major depressive disorder, recurrent, in remission, unspecified: Secondary | ICD-10-CM

## 2022-04-23 DIAGNOSIS — E1140 Type 2 diabetes mellitus with diabetic neuropathy, unspecified: Secondary | ICD-10-CM | POA: Diagnosis not present

## 2022-04-23 DIAGNOSIS — R419 Unspecified symptoms and signs involving cognitive functions and awareness: Secondary | ICD-10-CM

## 2022-04-23 DIAGNOSIS — K219 Gastro-esophageal reflux disease without esophagitis: Secondary | ICD-10-CM

## 2022-04-23 DIAGNOSIS — I4902 Ventricular flutter: Secondary | ICD-10-CM | POA: Diagnosis not present

## 2022-04-23 DIAGNOSIS — R69 Illness, unspecified: Secondary | ICD-10-CM | POA: Diagnosis not present

## 2022-04-23 DIAGNOSIS — J4540 Moderate persistent asthma, uncomplicated: Secondary | ICD-10-CM | POA: Diagnosis not present

## 2022-04-23 DIAGNOSIS — E039 Hypothyroidism, unspecified: Secondary | ICD-10-CM

## 2022-04-23 DIAGNOSIS — E785 Hyperlipidemia, unspecified: Secondary | ICD-10-CM | POA: Diagnosis not present

## 2022-04-23 LAB — POCT GLYCOSYLATED HEMOGLOBIN (HGB A1C): Hemoglobin A1C: 6.7 % — AB (ref 4.0–5.6)

## 2022-04-23 MED ORDER — TIRZEPATIDE 5 MG/0.5ML ~~LOC~~ SOAJ: 5.0000 mg | SUBCUTANEOUS | 0 refills | Status: DC

## 2022-04-23 MED ORDER — TIRZEPATIDE 2.5 MG/0.5ML ~~LOC~~ SOAJ: 2.5000 mg | SUBCUTANEOUS | 0 refills | Status: DC

## 2022-04-26 ENCOUNTER — Encounter: Payer: Self-pay | Admitting: Family Medicine

## 2022-04-26 DIAGNOSIS — I2089 Other forms of angina pectoris: Secondary | ICD-10-CM | POA: Diagnosis not present

## 2022-04-26 DIAGNOSIS — R0602 Shortness of breath: Secondary | ICD-10-CM | POA: Diagnosis not present

## 2022-04-26 DIAGNOSIS — I10 Essential (primary) hypertension: Secondary | ICD-10-CM | POA: Diagnosis not present

## 2022-04-26 DIAGNOSIS — E785 Hyperlipidemia, unspecified: Secondary | ICD-10-CM | POA: Diagnosis not present

## 2022-05-07 ENCOUNTER — Other Ambulatory Visit: Payer: Self-pay | Admitting: Internal Medicine

## 2022-05-07 ENCOUNTER — Other Ambulatory Visit: Payer: Self-pay | Admitting: Family Medicine

## 2022-05-07 DIAGNOSIS — E039 Hypothyroidism, unspecified: Secondary | ICD-10-CM

## 2022-05-07 DIAGNOSIS — I2089 Other forms of angina pectoris: Secondary | ICD-10-CM

## 2022-05-13 DIAGNOSIS — R0602 Shortness of breath: Secondary | ICD-10-CM | POA: Diagnosis not present

## 2022-05-13 DIAGNOSIS — I2089 Other forms of angina pectoris: Secondary | ICD-10-CM | POA: Diagnosis not present

## 2022-05-14 DIAGNOSIS — S8002XA Contusion of left knee, initial encounter: Secondary | ICD-10-CM | POA: Diagnosis not present

## 2022-05-18 ENCOUNTER — Other Ambulatory Visit: Payer: Self-pay | Admitting: Family Medicine

## 2022-05-18 DIAGNOSIS — E1169 Type 2 diabetes mellitus with other specified complication: Secondary | ICD-10-CM

## 2022-05-18 DIAGNOSIS — S8002XA Contusion of left knee, initial encounter: Secondary | ICD-10-CM | POA: Diagnosis not present

## 2022-05-20 ENCOUNTER — Telehealth (HOSPITAL_COMMUNITY): Payer: Self-pay | Admitting: *Deleted

## 2022-05-20 NOTE — Telephone Encounter (Signed)
Attempted to call patient regarding upcoming cardiac CT appointment. °Left message on voicemail with name and callback number ° °Sayda Grable RN Navigator Cardiac Imaging °[facility] °[phone] Office °[phone] Cell ° °

## 2022-05-21 ENCOUNTER — Telehealth (HOSPITAL_COMMUNITY): Payer: Self-pay | Admitting: *Deleted

## 2022-05-21 ENCOUNTER — Encounter (HOSPITAL_COMMUNITY): Payer: Self-pay

## 2022-05-21 MED ORDER — METOPROLOL TARTRATE 100 MG PO TABS: ORAL_TABLET | ORAL | 0 refills | Status: DC

## 2022-05-21 NOTE — Telephone Encounter (Signed)
Reaching out to patient to offer assistance regarding upcoming cardiac imaging study; pt verbalizes understanding of appt date/time, parking situation and where to check in, pre-test NPO status and medications ordered, and verified current allergies; name and call back number provided for further questions should they arise  Gordy Clement RN Navigator Cardiac Imaging [facility] [phone] office [phone] cell  Patient to take '100mg'$  metoprolol tartrate two hours prior to her cardiac CT Scan.

## 2022-05-24 ENCOUNTER — Ambulatory Visit
Admission: RE | Admit: 2022-05-24 | Discharge: 2022-05-24 | Disposition: A | Discharge status: Home / Self Care | Payer: Medicare HMO | Source: Ambulatory Visit | Attending: Internal Medicine | Admitting: Internal Medicine

## 2022-05-24 DIAGNOSIS — I2089 Other forms of angina pectoris: Secondary | ICD-10-CM | POA: Insufficient documentation

## 2022-05-24 LAB — POCT I-STAT CREATININE: Creatinine, Ser: 1.1 mg/dL — ABNORMAL HIGH (ref 0.44–1.00)

## 2022-05-24 MED ORDER — NITROGLYCERIN 0.4 MG SL SUBL
SUBLINGUAL_TABLET | SUBLINGUAL | Status: AC
  Filled 2022-05-24: qty 2

## 2022-05-24 MED ORDER — IOHEXOL 350 MG/ML SOLN
100.0000 mL | Freq: Once | INTRAVENOUS | Status: AC | PRN
  Administered 2022-05-24: 100 mL via INTRAVENOUS

## 2022-05-24 MED ORDER — NITROGLYCERIN 0.4 MG SL SUBL
0.8000 mg | SUBLINGUAL_TABLET | Freq: Once | SUBLINGUAL | Status: AC
  Administered 2022-05-24: 0.8 mg via SUBLINGUAL

## 2022-05-26 ENCOUNTER — Other Ambulatory Visit: Payer: Self-pay | Admitting: Family Medicine

## 2022-05-26 DIAGNOSIS — I10 Essential (primary) hypertension: Secondary | ICD-10-CM

## 2022-05-29 ENCOUNTER — Other Ambulatory Visit: Payer: Self-pay | Admitting: Family Medicine

## 2022-05-29 DIAGNOSIS — I10 Essential (primary) hypertension: Secondary | ICD-10-CM

## 2022-06-01 ENCOUNTER — Ambulatory Visit: Payer: Medicare HMO

## 2022-06-03 ENCOUNTER — Ambulatory Visit: Payer: Medicare HMO

## 2022-06-04 ENCOUNTER — Ambulatory Visit (INDEPENDENT_AMBULATORY_CARE_PROVIDER_SITE_OTHER): Payer: Medicare HMO

## 2022-06-04 VITALS — Ht 66.0 in | Wt 270.0 lb

## 2022-06-04 DIAGNOSIS — Z1231 Encounter for screening mammogram for malignant neoplasm of breast: Secondary | ICD-10-CM

## 2022-06-04 DIAGNOSIS — Z13820 Encounter for screening for osteoporosis: Secondary | ICD-10-CM | POA: Diagnosis not present

## 2022-06-04 DIAGNOSIS — Z0000 Encounter for general adult medical examination without abnormal findings: Secondary | ICD-10-CM | POA: Diagnosis not present

## 2022-06-04 NOTE — Progress Notes (Signed)
I connected with  [name] on 06/04/22 by a video and audio enabled telemedicine application and verified that I am speaking with the correct person using two identifiers.  Patient Location: Home  Provider Location: Office/Clinic  I discussed the limitations of evaluation and management by telemedicine. The patient expressed understanding and agreed to proceed.  Subjective:   Sarah Gordon is a 72 y.o. female who presents for Medicare Annual (Subsequent) preventive examination.  Review of Systems    Cardiac Risk Factors include: advanced age (>33men, >44 women);dyslipidemia;hypertension;obesity (BMI >30kg/m2);sedentary lifestyle    Objective:    Today's Vitals   06/04/22 1052 06/04/22 1053  Weight: 270 lb (122.5 kg)   Height: 5\' 6"  (1.676 m)   PainSc:  6    Body mass index is 43.58 kg/m.     06/04/2022   11:06 AM 02/20/2022   12:54 PM 06/18/2021    7:59 AM 05/28/2021    8:11 AM 05/27/2020    9:41 AM 05/24/2019    9:35 AM 02/25/2017    7:58 AM  Advanced Directives  Does Patient Have a Medical Advance Directive? No No No No No No No  Would patient like information on creating a medical advance directive?    No - Patient declined Yes (MAU/Ambulatory/Procedural Areas - Information given) Yes (MAU/Ambulatory/Procedural Areas - Information given) No - Patient declined    Outpatient Encounter Medications as of 06/04/2022  Medication Sig   acetaminophen (TYLENOL) 650 MG CR tablet Take 1,300 mg by mouth 2 (two) times daily.   albuterol (VENTOLIN HFA) 108 (90 Base) MCG/ACT inhaler Inhale 2 puffs into the lungs every 6 (six) hours as needed for wheezing or shortness of breath.   baclofen (LIORESAL) 10 MG tablet    DULoxetine (CYMBALTA) 60 MG capsule Take 1 capsule (60 mg total) by mouth daily.   Evolocumab (REPATHA) 140 MG/ML SOSY Inject 1 mL into the skin every 14 (fourteen) days.   ezetimibe (ZETIA) 10 MG tablet TAKE 1 TABLET BY MOUTH DAILY   FLOVENT DISKUS 100 MCG/ACT AEPB INHALE 2  PUFFS BY MOUTH TWICE A DAY   hydrOXYzine (ATARAX) 10 MG tablet Take 1 tablet (10 mg total) by mouth 3 (three) times daily as needed.   levothyroxine (SYNTHROID) 100 MCG tablet TAKE 1 TABLET BY MOUTH DAILY   meloxicam (MOBIC) 15 MG tablet Take 15 mg by mouth daily. Takes at night   metoprolol succinate (TOPROL-XL) 25 MG 24 hr tablet Take 1 tablet (25 mg total) by mouth daily.   montelukast (SINGULAIR) 10 MG tablet Take 1 tablet (10 mg total) by mouth at bedtime.   omeprazole (PRILOSEC) 20 MG capsule TAKE ONE CAPSULE BY MOUTH DAILY   QUEtiapine (SEROQUEL) 25 MG tablet TAKE 1 TABLET BY MOUTH AT BEDTIME   tirzepatide (MOUNJARO) 2.5 MG/0.5ML Pen Inject 2.5 mg into the skin once a week.   tirzepatide [facility]) 5 MG/0.5ML Pen Inject 5 mg into the skin once a week.   traMADol (ULTRAM) 50 MG tablet Take 50 mg by mouth daily as needed.   valsartan-hydrochlorothiazide (DIOVAN-HCT) 160-25 MG tablet Take 1 tablet by mouth daily.   meclizine (ANTIVERT) 25 MG tablet Take 1 tablet (25 mg total) by mouth 3 (three) times daily as needed for dizziness. (Patient not taking: Reported on 06/04/2022)   metoprolol tartrate (LOPRESSOR) 100 MG tablet Take tablet (100mg ) TWO hours prior to your cardiac CT scan.   [DISCONTINUED] Fluticasone-Salmeterol (ADVAIR DISKUS) 250-50 MCG/DOSE AEPB Inhale 1 puff into the lungs 2 (two) times  daily.   No facility-administered encounter medications on file as of 06/04/2022.    Allergies (verified) Vascepa [icosapent ethyl], Aspir-81 [aspirin], Gabapentin, Statins, Topamax [topiramate], Voltaren [diclofenac sodium], and Lipitor [atorvastatin]   History: Past Medical History:  Diagnosis Date   Allergy    Anxiety '95   Asthma    Cataract    Depression '95   Esophageal reflux    Gouty arthropathy, unspecified    Herpes simplex without mention of complication    Irritable bowel syndrome    Localized osteoarthrosis not specified whether primary or secondary, unspecified site     Migraine, unspecified, without mention of intractable migraine without mention of status migrainosus    Mixed hyperlipidemia    Myalgia and myositis, unspecified    Obstructive sleep apnea (adult) (pediatric)    Palpitations    Sleep apnea    Type II or unspecified type diabetes mellitus without mention of complication, not stated as uncontrolled    Umbilical hernia without mention of obstruction or gangrene    Unspecified essential hypertension    Unspecified hypothyroidism    Past Surgical History:  Procedure Laterality Date   BREAST BIOPSY Left    benign   COLONOSCOPY     Dr Alveta Heimlich   COLONOSCOPY WITH PROPOFOL N/A 11/19/2015   Procedure: COLONOSCOPY WITH PROPOFOL;  Surgeon: Christene Lye, MD;  Location: [facility] ENDOSCOPY;  Service: Endoscopy;  Laterality: N/A;   COLONOSCOPY WITH PROPOFOL N/A 06/18/2021   Procedure: COLONOSCOPY WITH PROPOFOL;  Surgeon: Lin Landsman, MD;  Location: [facility] ENDOSCOPY;  Service: Gastroenterology;  Laterality: N/A;   ESOPHAGOGASTRODUODENOSCOPY N/A 08/20/2014   Procedure: ESOPHAGOGASTRODUODENOSCOPY (EGD);  Surgeon: Christene Lye, MD;  Location: [facility] ENDOSCOPY;  Service: Endoscopy;  Laterality: N/A;   ESOPHAGOGASTRODUODENOSCOPY (EGD) WITH PROPOFOL N/A 06/18/2021   Procedure: ESOPHAGOGASTRODUODENOSCOPY (EGD) WITH PROPOFOL;  Surgeon: Lin Landsman, MD;  Location: [facility] ENDOSCOPY;  Service: Gastroenterology;  Laterality: N/A;   FOOT SURGERY Left    heel spur   HERNIA REPAIR     umbilical   NASAL SINUS SURGERY  [id number]   UMBILICAL HERNIA REPAIR     Family History  Problem Relation Age of Onset   Hypertension Mother    Hyperlipidemia Mother    Diabetes Mother    Heart disease Mother    Stroke Mother    Varicose Veins Mother    Alcohol abuse Father    Lupus Sister    Arthritis Sister    Depression Sister    Obesity Sister    Asthma Sister    Depression Sister    Hypertension Sister    83 / Stillbirths Sister    Varicose  Veins Sister    Depression Sister    Arthritis Sister    Heart disease Sister    Hyperlipidemia Sister    Hypertension Sister    22 / [place] Sister    Varicose Veins Sister    Miscarriages / Stillbirths Sister    Arthritis Sister    Depression Sister    Heart disease Sister    Hyperlipidemia Sister    Hypertension Sister    Heart disease Brother        CABG x 3    Heart attack Brother 53   Hyperlipidemia Brother    Hypertension Brother    Diabetes Brother    Hypertension Brother    Obesity Brother    Asthma Brother    Depression Brother    Hyperlipidemia Brother    Alcohol abuse Brother    Heart  disease Brother    Stroke Brother    Varicose Veins Brother    Early death Brother    Stroke Brother    Arthritis Brother    Depression Brother    Hyperlipidemia Brother    Hypertension Brother    Stroke Maternal Grandfather    Breast cancer Neg Hx    Social History   Socioeconomic History   Marital status: Married    Spouse name: Georgena Spurling    Number of children: 1   Years of education: Not on file   Highest education level: Some college, no degree  Occupational History    Comment: works part time at [employer]  Tobacco Use   Smoking status: Never   Smokeless tobacco: Never  Vaping Use   Vaping Use: Never used  Substance and Sexual Activity   Alcohol use: No    Comment: RARELY   Drug use: No   Sexual activity: Not Currently    Birth control/protection: Post-menopausal  Other Topics Concern   Not on file  Social History Narrative   Not on file   Social Determinants of Health   Financial Resource Strain: Medium Risk (06/04/2022)   Overall Financial Resource Strain (CARDIA)    Difficulty of Paying Living Expenses: Somewhat hard  Food Insecurity: No Food Insecurity (06/04/2022)   Hunger Vital Sign    Worried About Running Out of Food in the Last Year: Never true    Ran Out of Food in the Last Year: Never true  Transportation Needs: Unknown (06/04/2022)    PRAPARE - Hydrologist (Medical): Not on file    Lack of Transportation (Non-Medical): No  Physical Activity: Inactive (06/04/2022)   Exercise Vital Sign    Days of Exercise per Week: 0 days    Minutes of Exercise per Session: 0 min  Stress: No Stress Concern Present (06/04/2022)   [place]    Feeling of Stress : Not at all  Social Connections: [place] (06/04/2022)   Social Connection and Isolation Panel [NHANES]    Frequency of Communication with Friends and Family: More than three times a week    Frequency of Social Gatherings with Friends and Family: Three times a week    Attends Religious Services: More than 4 times per year    Active Member of Clubs or Organizations: Yes    Attends Archivist Meetings: 1 to 4 times per year    Marital Status: Married    Tobacco Counseling Counseling given: Not Answered   Clinical Intake:  Pre-visit preparation completed: Yes  Pain : 0-10 Pain Score: 6  Pain Type: Acute pain Pain Location: Knee Pain Orientation: Left Pain Descriptors / Indicators: Aching Pain Onset: 1 to 4 weeks ago Pain Frequency: Intermittent Pain Relieving Factors: taking brace off and resting/elevating leg  Pain Relieving Factors: taking brace off and resting/elevating leg  BMI - recorded: 43.58 Nutritional Status: BMI > 30  Obese Nutritional Risks: None Diabetes: Yes CBG done?: No Did pt. bring in CBG monitor from home?: No  How often do you need to have someone help you when you read instructions, pamphlets, or other written materials from your doctor or pharmacy?: 1 - Never  Diabetic?yes  Interpreter Needed?: No  Comments: Lives w/husband Information entered by :: B.Dharma Pare,LPN   Activities of Daily Living    06/04/2022   11:06 AM 04/23/2022   10:43 AM  In your present state of health, do you  have any difficulty performing the  following activities:  Hearing? 0 1  Vision? 0 0  Difficulty concentrating or making decisions? 1 1  Walking or climbing stairs? 1 1  Dressing or bathing?  0  Doing errands, shopping? 0 0  Preparing Food and eating ? N   Using the Toilet? N   In the past six months, have you accidently leaked urine? N   Do you have problems with loss of bowel control? N   Managing your Medications? N   Managing your Finances? N   Housekeeping or managing your Housekeeping? N     Patient Care Team: Steele Sizer, MD as PCP - General (Family Medicine) [place], [place] as Consulting Physician (Optometry)  Indicate any recent Medical Services you may have received from other than [facility] providers in the past year (date may be approximate).     Assessment:   This is a routine wellness examination for Sarah Gordon.  Hearing/Vision screen Hearing Screening - Comments:: Adequate hearing Vision Screening - Comments:: Adequate vision w/glasses Dr Ellin Mayhew  Dietary issues and exercise activities discussed: Current Exercise Habits: The patient does not participate in regular exercise at present, Exercise limited by: orthopedic condition(s)   Goals Addressed   None    Depression Screen    06/04/2022   11:03 AM 04/23/2022   10:44 AM 02/23/2022    9:36 AM 01/21/2022   10:38 AM 10/27/2021    8:23 AM 05/28/2021    8:09 AM 03/31/2021    8:09 AM  PHQ 2/9 Scores  PHQ - 2 Score 2 0 2 1 2  0 0  PHQ- 9 Score 2 2 2 1 7 3  0    Fall Risk    06/04/2022   10:57 AM 04/23/2022   10:43 AM 02/23/2022    9:36 AM 01/21/2022   10:38 AM 10/27/2021    8:23 AM  Fall Risk   Falls in the past year? 1 0 1 0 0  Number falls in past yr: 0 0 1 0 0  Injury with Fall? 0 0 0 0 0  Risk for fall due to : No Fall Risks No Fall Risks Impaired balance/gait Impaired balance/gait;Impaired mobility No Fall Risks  Follow up Education provided;Falls prevention discussed Falls prevention discussed Falls prevention discussed;Education  provided;Falls evaluation completed  Falls prevention discussed    FALL RISK PREVENTION PERTAINING TO THE HOME:  Any stairs in or around the home? No  If so, are there any without handrails? No  Home free of loose throw rugs in walkways, pet beds, electrical cords, etc? Yes  Adequate lighting in your home to reduce risk of falls? Yes   ASSISTIVE DEVICES UTILIZED TO PREVENT FALLS:  Life alert? No  Use of a cane, walker or w/c? No  Grab bars in the bathroom? Yes  Shower chair or bench in shower? No  Elevated toilet seat or a handicapped toilet? Yes    Cognitive Function:    10/27/2021    8:33 AM  MMSE - Mini Mental State Exam  Orientation to time 5  Orientation to Place 5  Registration 3  Attention/ Calculation 5  Recall 3  Language- name 2 objects 2  Language- repeat 1  Language- follow 3 step command 3  Language- read & follow direction 1  Write a sentence 1  Copy design 1  Total score 30        06/04/2022   11:08 AM 05/24/2019    9:40 AM  6CIT Screen  What  Year? 0 points 0 points  What month? 0 points 0 points  What time? 0 points 0 points  Count back from 20 0 points 0 points  Months in reverse 0 points 0 points  Repeat phrase 0 points 0 points  Total Score 0 points 0 points    Immunizations Immunization History  Administered Date(s) Administered   Fluad Quad(high Dose 65+) 12/06/2018, 01/22/2020, 11/25/2020   Hepatitis B, ADULT 02/27/2018   Hepb-cpg 12/06/2018, 10/10/2020   Influenza, High Dose Seasonal PF 02/27/2018   Influenza-Unspecified 01/03/2022   PFIZER(Purple Top)SARS-COV-2 Vaccination 04/26/2019, 05/22/2019, 12/24/2019, 10/18/2020   Pfizer Covid-19 Vaccine Bivalent Booster 42yrs & up 10/09/2021   Pneumococcal Conjugate-13 07/30/2013   Pneumococcal Polysaccharide-23 10/12/2011, 02/27/2018   RSV,unspecified 01/01/2022   Respiratory Syncytial Virus Vaccine,Recomb Aduvanted(Arexvy) 01/02/2022   Tdap 10/12/2011   Zoster Recombinat (Shingrix)  10/18/2020, 10/09/2021   Zoster, Live 08/06/2010    TDAP status: Up to date  Flu Vaccine status: Up to date  Pneumococcal vaccine status: Up to date  Covid-19 vaccine status: Completed vaccines  Qualifies for Shingles Vaccine? Yes   Zostavax completed Yes   Shingrix Completed?: Yes  Screening Tests Health Maintenance  Topic Date Due   OPHTHALMOLOGY EXAM  07/09/2021   DTaP/Tdap/Td (2 - Td or Tdap) 10/11/2021   COVID-19 Vaccine (6 - 2023-24 season) 12/04/2021   MAMMOGRAM  05/23/2022   Diabetic kidney evaluation - Urine ACR  05/29/2022   HEMOGLOBIN A1C  10/22/2022   Diabetic kidney evaluation - eGFR measurement  02/21/2023   FOOT EXAM  04/24/2023   Medicare Annual Wellness (AWV)  06/04/2023   COLONOSCOPY (Pts 45-69yrs Insurance coverage will need to be confirmed)  06/19/2026   Pneumonia Vaccine 52+ Years old  Completed   INFLUENZA VACCINE  Completed   DEXA SCAN  Completed   Hepatitis C Screening  Completed   Zoster Vaccines- Shingrix  Completed   HPV VACCINES  Aged Out    Health Maintenance  Health Maintenance Due  Topic Date Due   OPHTHALMOLOGY EXAM  07/09/2021   DTaP/Tdap/Td (2 - Td or Tdap) 10/11/2021   COVID-19 Vaccine (6 - 2023-24 season) 12/04/2021   MAMMOGRAM  05/23/2022   Diabetic kidney evaluation - Urine ACR  05/29/2022    Colorectal cancer screening: Type of screening: Colonoscopy. Completed yes. Repeat every 5 years  Mammogram status: Ordered yes. Pt provided with contact info and advised to call to schedule appt.   Bone Density status: Ordered yes. Pt provided with contact info and advised to call to schedule appt.  Lung Cancer Screening: (Low Dose CT Chest recommended if Age 76-80 years, 30 pack-year currently smoking OR have quit w/in 15years.) does not qualify.   Lung Cancer Screening Referral: no  Additional Screening:  Hepatitis C Screening: does not qualify; Completed yes  Vision Screening: Recommended annual ophthalmology exams for early  detection of glaucoma and other disorders of the eye. Is the patient up to date with their annual eye exam?  Yes  Who is the provider or what is the name of the office in which the patient attends annual eye exams? Dr Ellin Mayhew If pt is not established with a provider, would they like to be referred to a provider to establish care? No .   Dental Screening: Recommended annual dental exams for proper oral hygiene  Community Resource Referral / Chronic Care Management: CRR required this visit?  No   CCM required this visit?  No      Plan:     I  have personally reviewed and noted the following in the patient's chart:   Medical and social history Use of alcohol, tobacco or illicit drugs  Current medications and supplements including opioid prescriptions. Patient is not currently taking opioid prescriptions. Functional ability and status Nutritional status Physical activity Advanced directives List of other physicians Hospitalizations, surgeries, and ER visits in previous 12 months Vitals Screenings to include cognitive, depression, and falls Referrals and appointments  In addition, I have reviewed and discussed with patient certain preventive protocols, quality metrics, and best practice recommendations. A written personalized care plan for preventive services as well as general preventive health recommendations were provided to patient.     Roger Shelter, LPN   [id number]   Nurse Notes: pt is doing best after a fall om last month from grandson pulling chair from her accidentally. Pt is wearing brace on Left knee. Otherwise she is dealing with death of sibling and other siblings who are critically ill. Despite, she has no concerns or questions for this visit.  MMG and Dexa ordered today

## 2022-06-04 NOTE — Patient Instructions (Signed)
Sarah Gordon , Thank you for taking time to come for your Medicare Wellness Visit. I appreciate your ongoing commitment to your health goals. Please review the following plan we discussed and let me know if I can assist you in the future.   These are the goals we discussed:  Goals      DIET - INCREASE WATER INTAKE     Recommend drinking 6-8 glasses of water per day      Weight (lb) < 240 lb (108.9 kg)     Pt would like to lose 20 lbs over the next year with healthy eating and exercise        This is a list of the screening recommended for you and due dates:  Health Maintenance  Topic Date Due   Eye exam for diabetics  07/09/2021   DTaP/Tdap/Td vaccine (2 - Td or Tdap) 10/11/2021   COVID-19 Vaccine (6 - 2023-24 season) 12/04/2021   Mammogram  05/23/2022   Yearly kidney health urinalysis for diabetes  05/29/2022   Hemoglobin A1C  10/22/2022   Yearly kidney function blood test for diabetes  02/21/2023   Complete foot exam   04/24/2023   Medicare Annual Wellness Visit  06/04/2023   Colon Cancer Screening  06/19/2026   Pneumonia Vaccine  Completed   Flu Shot  Completed   DEXA scan (bone density measurement)  Completed   Hepatitis C Screening: USPSTF Recommendation to screen - Ages 68-79 yo.  Completed   Zoster (Shingles) Vaccine  Completed   HPV Vaccine  Aged Out    Advanced directives: no  Conditions/risks identified: falls risk  Next appointment: Follow up in one year for your annual wellness visit 06/10/2023 @10 :15am VIDEO VISIT Preventive Care 65 Years and Older, Female Preventive care refers to lifestyle choices and visits with your health care provider that can promote health and wellness. What does preventive care include? A yearly physical exam. This is also called an annual well check. Dental exams once or twice a year. Routine eye exams. Ask your health care provider how often you should have your eyes checked. Personal lifestyle choices, including: Daily care of  your teeth and gums. Regular physical activity. Eating a healthy diet. Avoiding tobacco and drug use. Limiting alcohol use. Practicing safe sex. Taking low-dose aspirin every day. Taking vitamin and mineral supplements as recommended by your health care provider. What happens during an annual well check? The services and screenings done by your health care provider during your annual well check will depend on your age, overall health, lifestyle risk factors, and family history of disease. Counseling  Your health care provider may ask you questions about your: Alcohol use. Tobacco use. Drug use. Emotional well-being. Home and relationship well-being. Sexual activity. Eating habits. History of falls. Memory and ability to understand (cognition). Work and work Statistician. Reproductive health. Screening  You may have the following tests or measurements: Height, weight, and BMI. Blood pressure. Lipid and cholesterol levels. These may be checked every 5 years, or more frequently if you are over 74 years old. Skin check. Lung cancer screening. You may have this screening every year starting at age 59 if you have a 30-pack-year history of smoking and currently smoke or have quit within the past 15 years. Fecal occult blood test (FOBT) of the stool. You may have this test every year starting at age 76. Flexible sigmoidoscopy or colonoscopy. You may have a sigmoidoscopy every 5 years or a colonoscopy every 10 years starting at age 91.  Hepatitis C blood test. Hepatitis B blood test. Sexually transmitted disease (STD) testing. Diabetes screening. This is done by checking your blood sugar (glucose) after you have not eaten for a while (fasting). You may have this done every 1-3 years. Bone density scan. This is done to screen for osteoporosis. You may have this done starting at age 66. Mammogram. This may be done every 1-2 years. Talk to your health care provider about how often you should  have regular mammograms. Talk with your health care provider about your test results, treatment options, and if necessary, the need for more tests. Vaccines  Your health care provider may recommend certain vaccines, such as: Influenza vaccine. This is recommended every year. Tetanus, diphtheria, and acellular pertussis (Tdap, Td) vaccine. You may need a Td booster every 10 years. Zoster vaccine. You may need this after age 74. Pneumococcal 13-valent conjugate (PCV13) vaccine. One dose is recommended after age 3. Pneumococcal polysaccharide (PPSV23) vaccine. One dose is recommended after age 51. Talk to your health care provider about which screenings and vaccines you need and how often you need them. This information is not intended to replace advice given to you by your health care provider. Make sure you discuss any questions you have with your health care provider. Document Released: 04/04/2015 Document Revised: 11/26/2015 Document Reviewed: 01/07/2015 Elsevier Interactive Patient Education  2017 [employer] Prevention in the Home Falls can cause injuries. They can happen to people of all ages. There are many things you can do to make your home safe and to help prevent falls. What can I do on the outside of my home? Regularly fix the edges of walkways and driveways and fix any cracks. Remove anything that might make you trip as you walk through a door, such as a raised step or threshold. Trim any bushes or trees on the path to your home. Use bright outdoor lighting. Clear any walking paths of anything that might make someone trip, such as rocks or tools. Regularly check to see if handrails are loose or broken. Make sure that both sides of any steps have handrails. Any raised decks and porches should have guardrails on the edges. Have any leaves, snow, or ice cleared regularly. Use sand or salt on walking paths during winter. Clean up any spills in your garage right away. This  includes oil or grease spills. What can I do in the bathroom? Use night lights. Install grab bars by the toilet and in the tub and shower. Do not use towel bars as grab bars. Use non-skid mats or decals in the tub or shower. If you need to sit down in the shower, use a plastic, non-slip stool. Keep the floor dry. Clean up any water that spills on the floor as soon as it happens. Remove soap buildup in the tub or shower regularly. Attach bath mats securely with double-sided non-slip rug tape. Do not have throw rugs and other things on the floor that can make you trip. What can I do in the bedroom? Use night lights. Make sure that you have a light by your bed that is easy to reach. Do not use any sheets or blankets that are too big for your bed. They should not hang down onto the floor. Have a firm chair that has side arms. You can use this for support while you get dressed. Do not have throw rugs and other things on the floor that can make you trip. What can I do in  the kitchen? Clean up any spills right away. Avoid walking on wet floors. Keep items that you use a lot in easy-to-reach places. If you need to reach something above you, use a strong step stool that has a grab bar. Keep electrical cords out of the way. Do not use floor polish or wax that makes floors slippery. If you must use wax, use non-skid floor wax. Do not have throw rugs and other things on the floor that can make you trip. What can I do with my stairs? Do not leave any items on the stairs. Make sure that there are handrails on both sides of the stairs and use them. Fix handrails that are broken or loose. Make sure that handrails are as long as the stairways. Check any carpeting to make sure that it is firmly attached to the stairs. Fix any carpet that is loose or worn. Avoid having throw rugs at the top or bottom of the stairs. If you do have throw rugs, attach them to the floor with carpet tape. Make sure that you  have a light switch at the top of the stairs and the bottom of the stairs. If you do not have them, ask someone to add them for you. What else can I do to help prevent falls? Wear shoes that: Do not have high heels. Have rubber bottoms. Are comfortable and fit you well. Are closed at the toe. Do not wear sandals. If you use a stepladder: Make sure that it is fully opened. Do not climb a closed stepladder. Make sure that both sides of the stepladder are locked into place. Ask someone to hold it for you, if possible. Clearly mark and make sure that you can see: Any grab bars or handrails. First and last steps. Where the edge of each step is. Use tools that help you move around (mobility aids) if they are needed. These include: Canes. Walkers. Scooters. Crutches. Turn on the lights when you go into a dark area. Replace any light bulbs as soon as they burn out. Set up your furniture so you have a clear path. Avoid moving your furniture around. If any of your floors are uneven, fix them. If there are any pets around you, be aware of where they are. Review your medicines with your doctor. Some medicines can make you feel dizzy. This can increase your chance of falling. Ask your doctor what other things that you can do to help prevent falls. This information is not intended to replace advice given to you by your health care provider. Make sure you discuss any questions you have with your health care provider. Document Released: 01/02/2009 Document Revised: 08/14/2015 Document Reviewed: 04/12/2014 Elsevier Interactive Patient Education  2017 [employer].

## 2022-06-05 ENCOUNTER — Other Ambulatory Visit: Payer: Self-pay | Admitting: Family Medicine

## 2022-06-05 DIAGNOSIS — J4520 Mild intermittent asthma, uncomplicated: Secondary | ICD-10-CM

## 2022-06-05 DIAGNOSIS — F3340 Major depressive disorder, recurrent, in remission, unspecified: Secondary | ICD-10-CM

## 2022-06-06 ENCOUNTER — Ambulatory Visit
Admission: RE | Admit: 2022-06-06 | Discharge: 2022-06-06 | Disposition: A | Discharge status: Home / Self Care | Payer: Medicare HMO | Source: Ambulatory Visit | Attending: Student | Admitting: Student

## 2022-06-06 ENCOUNTER — Other Ambulatory Visit: Payer: Self-pay | Admitting: Student

## 2022-06-06 DIAGNOSIS — R2242 Localized swelling, mass and lump, left lower limb: Secondary | ICD-10-CM | POA: Diagnosis not present

## 2022-06-06 DIAGNOSIS — I82812 Embolism and thrombosis of superficial veins of left lower extremities: Secondary | ICD-10-CM | POA: Diagnosis not present

## 2022-06-06 DIAGNOSIS — R2240 Localized swelling, mass and lump, unspecified lower limb: Secondary | ICD-10-CM | POA: Diagnosis not present

## 2022-06-07 ENCOUNTER — Ambulatory Visit: Payer: Medicare HMO

## 2022-06-07 ENCOUNTER — Ambulatory Visit: Admission: RE | Admit: 2022-06-07 | Payer: Medicare HMO | Source: Ambulatory Visit

## 2022-06-07 ENCOUNTER — Other Ambulatory Visit: Payer: Self-pay | Admitting: Student

## 2022-06-07 DIAGNOSIS — R0789 Other chest pain: Secondary | ICD-10-CM | POA: Diagnosis not present

## 2022-06-07 DIAGNOSIS — I10 Essential (primary) hypertension: Secondary | ICD-10-CM | POA: Diagnosis not present

## 2022-06-07 DIAGNOSIS — R69 Illness, unspecified: Secondary | ICD-10-CM | POA: Diagnosis not present

## 2022-06-07 DIAGNOSIS — E785 Hyperlipidemia, unspecified: Secondary | ICD-10-CM | POA: Diagnosis not present

## 2022-06-07 DIAGNOSIS — I2510 Atherosclerotic heart disease of native coronary artery without angina pectoris: Secondary | ICD-10-CM | POA: Diagnosis not present

## 2022-06-07 DIAGNOSIS — R2242 Localized swelling, mass and lump, left lower limb: Secondary | ICD-10-CM

## 2022-06-09 ENCOUNTER — Encounter: Payer: Self-pay | Admitting: Family Medicine

## 2022-06-14 ENCOUNTER — Ambulatory Visit: Payer: Self-pay | Admitting: *Deleted

## 2022-06-14 ENCOUNTER — Telehealth (INDEPENDENT_AMBULATORY_CARE_PROVIDER_SITE_OTHER): Payer: Self-pay

## 2022-06-14 NOTE — Telephone Encounter (Signed)
  Chief Complaint: Left leg pain Symptoms: Has a vein with a knot in it that is in her thigh above the knee going up her thigh toward her groin.  She had an U/S done in the ED after she fell and injured her left knee.  Nothing was broken but they put her in a brace.   It was tight and she believe it is contributing to her circulation and the vein being puffy and sore with a knot in it. Frequency: Since being in the knee brace Pertinent Negatives: Patient denies new injuries Disposition: [] ED /[] Urgent Care (no appt availability in office) / [x] Appointment(In office/virtual)/ []  [facility]/ [] Home Care/ [] Refused Recommended Disposition /[] [facility]/ []  Follow-up with PCP Additional Notes: Appt made with Delsa Grana, PA for 06/15/2022 at 9:40. I went over the signs and symptoms to go to the ED.

## 2022-06-14 NOTE — Telephone Encounter (Signed)
Reason for Disposition  Localized pain, redness or hard lump along vein  Answer Assessment - Initial Assessment Questions 1. ONSET: "When did the pain start?"      My left leg is hurting.   I fell on my knee and had x rays done.   They put me in a hinged brace.   I had to tighten it to keep it from falling down.   I've developed a knot on my calf.   I have a varicose vein too that was big. I was sent to the ED and had an U/S done on my leg.    There was some phlebitis no clots. The redness and inflammation is going up my vein towards my groin area.   No appts. With the vascular center right now.    My left leg is warmer on the left.   Unable to tell if it's swollen.    "I'm a large lady".     My veins are standing out larger than usual.    There are dots of red around them.     2. LOCATION: "Where is the pain located?"      This is in my thigh.   The vein is sore and puffy near the joint and it crosses over above my knee and towards my groin.    3. PAIN: "How bad is the pain?"    (Scale 1-10; or mild, moderate, severe)   -  MILD (1-3): doesn't interfere with normal activities    -  MODERATE (4-7): interferes with normal activities (e.g., work or school) or awakens from sleep, limping    -  SEVERE (8-10): excruciating pain, unable to do any normal activities, unable to walk     Painful.    I had the U/S done on 06/06/2022.   4. WORK OR EXERCISE: "Has there been any recent work or exercise that involved this part of the body?"      I had a knee brace 5. CAUSE: "What do you think is causing the leg pain?"     I think I may have a blood clot.    6. OTHER SYMPTOMS: "Do you have any other symptoms?" (e.g., chest pain, back pain, breathing difficulty, swelling, rash, fever, numbness, weakness)     Shortness of breath no chest tightness 7. PREGNANCY: "Is there any chance you are pregnant?" "When was your last menstrual period?"     N/A due to age  Protocols used: Leg Pain-A-AH

## 2022-06-14 NOTE — Telephone Encounter (Signed)
Patient left a message stating that she is having swelling above her knee and was checking to see if we had sooner appointment. Patient was  notified per the schedule we do not have a sooner appointment available but she should contact her PCP for evaluation. Patient will keep appointment as schedule

## 2022-06-15 ENCOUNTER — Ambulatory Visit
Admission: RE | Admit: 2022-06-15 | Discharge: 2022-06-15 | Disposition: A | Discharge status: Home / Self Care | Payer: Medicare HMO | Source: Ambulatory Visit | Attending: Family Medicine | Admitting: Family Medicine

## 2022-06-15 ENCOUNTER — Encounter: Payer: Self-pay | Admitting: Family Medicine

## 2022-06-15 ENCOUNTER — Ambulatory Visit (INDEPENDENT_AMBULATORY_CARE_PROVIDER_SITE_OTHER): Payer: Medicare HMO | Admitting: Family Medicine

## 2022-06-15 VITALS — BP 134/80 | HR 85 | Temp 97.7°F | Resp 16 | Ht 64.0 in | Wt 280.7 lb

## 2022-06-15 DIAGNOSIS — I82612 Acute embolism and thrombosis of superficial veins of left upper extremity: Secondary | ICD-10-CM | POA: Insufficient documentation

## 2022-06-15 DIAGNOSIS — I8393 Asymptomatic varicose veins of bilateral lower extremities: Secondary | ICD-10-CM | POA: Insufficient documentation

## 2022-06-15 DIAGNOSIS — I803 Phlebitis and thrombophlebitis of lower extremities, unspecified: Secondary | ICD-10-CM | POA: Insufficient documentation

## 2022-06-15 DIAGNOSIS — Z5181 Encounter for therapeutic drug level monitoring: Secondary | ICD-10-CM

## 2022-06-15 DIAGNOSIS — R600 Localized edema: Secondary | ICD-10-CM | POA: Diagnosis not present

## 2022-06-15 DIAGNOSIS — K219 Gastro-esophageal reflux disease without esophagitis: Secondary | ICD-10-CM | POA: Diagnosis not present

## 2022-06-15 DIAGNOSIS — M79605 Pain in left leg: Secondary | ICD-10-CM | POA: Diagnosis not present

## 2022-06-15 MED ORDER — PANTOPRAZOLE SODIUM 40 MG PO TBEC: 40.0000 mg | DELAYED_RELEASE_TABLET | Freq: Two times a day (BID) | ORAL | 0 refills | Status: DC

## 2022-06-15 MED ORDER — IBUPROFEN 600 MG PO TABS: 600.0000 mg | ORAL_TABLET | Freq: Three times a day (TID) | ORAL | 0 refills | Status: AC | PRN

## 2022-06-15 NOTE — Progress Notes (Signed)
Patient ID: Sarah Gordon, female    DOB: [birth year], 72 y.o.   MRN: [id number]  PCP: Steele Sizer, MD  Chief Complaint  Patient presents with   Edema    Left leg for a week    Subjective:   Sarah Gordon is a 72 y.o. female, presents to clinic with CC of the following:  HPI  Here for worsening of left leg varicose vein pain, swelling/inflamed skin getting swollen and tender, red.  Acute and rapid worsening x 1 weeks, her sx started gradually after fall feb 25th, after wearing brace by ortho she then had gradual onset of pain and swelling to veins lower leg, eval on 3/17 DVT US study - but since then pain and swelling increased and moving up leg She has vascular referral but no appt for a month and she called them to get sooner appt - they said come here She has lifelong hx of varicose veins but never had swelling, pain, sore or issued with them.  No hx of LE edema No prior vascular eval or DVT  She is careful with NSAIDs on mobic and has GERD with omeprazole - not on blood thinners no hx of bleed  Currently no sores, drainage, fever Pain pretty severe, unable to get comfortable - even with elevating her leg, she has applied a warm towel and tried different positions    Patient Active Problem List   Diagnosis Date Noted   Cognitive complaints 10/27/2021   Polyp of colon    Lumbar radiculopathy 11/06/2020   Ventricular flutter ([place]) 12/06/2018   MDD (major depressive disorder), recurrent episode, mild ([place]) 09/04/2018   DDD (degenerative disc disease), cervical 01/23/2016   Degenerative joint disease of left acromioclavicular joint 01/23/2016   Recurrent oral herpes simplex infection 09/01/2015   Irritable bowel syndrome with diarrhea 08/07/2015   Statin intolerance 02/28/2015   Asthma, intermittent 09/10/2014   Controlled type 2 diabetes with neuropathy ([place]) 09/10/2014   Fibromyalgia 09/10/2014   Obstructive sleep apnea 09/10/2014   Hyperlipidemia 12/29/2012    Essential hypertension 12/29/2012   Morbid obesity ([place]) 12/29/2012   Arthritis due to gout 12/13/2007   Acid reflux 10/24/2006   Adult hypothyroidism 10/24/2006   Localized osteoarthrosis 10/24/2006      Current Outpatient Medications:    acetaminophen (TYLENOL) 650 MG CR tablet, Take 1,300 mg by mouth 2 (two) times daily., Disp: , Rfl:    albuterol (VENTOLIN HFA) 108 (90 Base) MCG/ACT inhaler, Inhale 2 puffs into the lungs every 6 (six) hours as needed for wheezing or shortness of breath., Disp: 1 each, Rfl: 2   baclofen (LIORESAL) 10 MG tablet, , Disp: , Rfl:    DULoxetine (CYMBALTA) 60 MG capsule, TAKE 1 CAPSULE BY MOUTH DAILY, Disp: 90 capsule, Rfl: 1   Evolocumab (REPATHA) 140 MG/ML SOSY, Inject 1 mL into the skin every 14 (fourteen) days., Disp: 2.1 mL, Rfl: 5   ezetimibe (ZETIA) 10 MG tablet, TAKE 1 TABLET BY MOUTH DAILY, Disp: 90 tablet, Rfl: 0   FLOVENT DISKUS 100 MCG/ACT AEPB, INHALE 2 PUFFS BY MOUTH TWICE A DAY, Disp: 60 each, Rfl: 1   hydrOXYzine (ATARAX) 10 MG tablet, Take 1 tablet (10 mg total) by mouth 3 (three) times daily as needed., Disp: 30 tablet, Rfl: 0   levothyroxine (SYNTHROID) 100 MCG tablet, TAKE 1 TABLET BY MOUTH DAILY, Disp: 90 tablet, Rfl: 0   meclizine (ANTIVERT) 25 MG tablet, Take 1 tablet (25 mg total) by mouth 3 (three) times  daily as needed for dizziness., Disp: 30 tablet, Rfl: 0   meloxicam (MOBIC) 15 MG tablet, Take 15 mg by mouth daily. Takes at night, Disp: , Rfl:    metoprolol succinate (TOPROL-XL) 25 MG 24 hr tablet, Take 1 tablet (25 mg total) by mouth daily., Disp: 90 tablet, Rfl: 1   montelukast (SINGULAIR) 10 MG tablet, TAKE 1 TABLET BY MOUTH AT BEDTIME, Disp: 90 tablet, Rfl: 1   omeprazole (PRILOSEC) 20 MG capsule, TAKE ONE CAPSULE BY MOUTH DAILY, Disp: 90 capsule, Rfl: 0   QUEtiapine (SEROQUEL) 25 MG tablet, TAKE 1 TABLET BY MOUTH AT BEDTIME, Disp: 90 tablet, Rfl: 0   traMADol (ULTRAM) 50 MG tablet, Take 50 mg by mouth daily as needed., Disp: ,  Rfl:    valsartan-hydrochlorothiazide (DIOVAN-HCT) 160-25 MG tablet, Take 1 tablet by mouth daily., Disp: 90 tablet, Rfl: 1   metoprolol tartrate (LOPRESSOR) 100 MG tablet, Take tablet (100mg ) TWO hours prior to your cardiac CT scan., Disp: 1 tablet, Rfl: 0   tirzepatide (MOUNJARO) 2.5 MG/0.5ML Pen, Inject 2.5 mg into the skin once a week., Disp: 2 mL, Rfl: 0   tirzepatide (MOUNJARO) 5 MG/0.5ML Pen, Inject 5 mg into the skin once a week., Disp: 6 mL, Rfl: 0   Allergies  Allergen Reactions   Vascepa [Icosapent Ethyl] Hives and Swelling   Aspir-81 [Aspirin] Other (See Comments)    Coagulation disorder   Gabapentin Other (See Comments)    Cognitive/memory issues   Statins Other (See Comments)   Topamax [Topiramate] Other (See Comments)    Cognitive issues    Voltaren [Diclofenac Sodium] Hives and Other (See Comments)   Lipitor [Atorvastatin]     Joint pain     Social History   Tobacco Use   Smoking status: Never   Smokeless tobacco: Never  Vaping Use   Vaping Use: Never used  Substance Use Topics   Alcohol use: No    Comment: RARELY   Drug use: No      Chart Review Today: I personally reviewed active problem list, medication list, allergies, family history, social history, health maintenance, notes from last encounter, lab results, imaging with the patient/caregiver today.   Review of Systems  Constitutional: Negative.   HENT: Negative.    Eyes: Negative.   Respiratory: Negative.    Cardiovascular: Negative.   Gastrointestinal: Negative.   Endocrine: Negative.   Genitourinary: Negative.   Musculoskeletal: Negative.   Skin: Negative.   Allergic/Immunologic: Negative.   Neurological: Negative.   Hematological: Negative.   Psychiatric/Behavioral: Negative.    All other systems reviewed and are negative.      Objective:   Vitals:   06/15/22 0943  BP: 134/80  Pulse: 85  Resp: 16  Temp: 97.7 F (36.5 C)  TempSrc: Oral  SpO2: 99%  Weight: 280 lb 11.2 oz  (127.3 kg)  Height: 5\' 4"  (1.626 m)    Body mass index is 48.18 kg/m.  Physical Exam Vitals and nursing note reviewed.  Constitutional:      General: She is not in acute distress.    Appearance: Normal appearance. She is well-developed. She is obese. She is not ill-appearing, toxic-appearing or diaphoretic.     Interventions: Face mask in place.  HENT:     Head: Normocephalic and atraumatic.     Right Ear: External ear normal.     Left Ear: External ear normal.  Eyes:     General: Lids are normal. No scleral icterus.       Right  eye: No discharge.        Left eye: No discharge.     Conjunctiva/sclera: Conjunctivae normal.  Neck:     Trachea: Phonation normal. No tracheal deviation.  Cardiovascular:     Rate and Rhythm: Normal rate and regular rhythm.     Pulses: Normal pulses.          Radial pulses are 2+ on the right side and 2+ on the left side.     Heart sounds: Normal heart sounds. No murmur heard.    No friction rub. No gallop.  Pulmonary:     Effort: Pulmonary effort is normal. No respiratory distress.     Breath sounds: Normal breath sounds. No stridor. No wheezing, rhonchi or rales.  Chest:     Chest wall: No tenderness.  Abdominal:     General: Bowel sounds are normal. There is no distension.     Palpations: Abdomen is soft.  Musculoskeletal:     Right lower leg: No edema.     Left lower leg: No edema.  Skin:    General: Skin is dry.     Coloration: Skin is not jaundiced or pale.     Findings: Erythema present. No rash.     Comments: Anterior left thigh mid to proximal with induration, warmth, erythema surrounding varicosity and distal left lateral thigh with same extending to lower prox left leg around lateral calf - varicosities with induration, tenderness, erythema No pitting edema lower B/l leg with scattered reticular veins and varicosities   Neurological:     Mental Status: She is alert.     Motor: No abnormal muscle tone.     Gait: Gait abnormal.   Psychiatric:        Mood and Affect: Mood normal.        Speech: Speech normal.        Behavior: Behavior normal.      Results for orders placed or performed during the hospital encounter of 05/24/22  I-STAT creatinine  Result Value Ref Range   Creatinine, Ser 1.10 (H) 0.44 - 1.00 mg/dL       Assessment & Plan:   1. Phlebitis or thrombophlebitis of lower extremity Discussed presentation difference of uncomplicated vs complicated - at this point no fever, wounds, purulent discharge - likely uncomplicated phlebitis presentation however I am concerned about possible progression of superficial thrombus and possible need for anticoagulation? For now conservative management while I try to get her into alternative vascular office vs repeated imaging stat today - Ambulatory referral to Vascular Surgery - US Venous Img Lower Unilateral Left  2. Edema of left lower leg worsening - Ambulatory referral to Vascular Surgery - US Venous Img Lower Unilateral Left  3. Varicose veins of both lower extremities, unspecified whether complicated Lifelong hx, no prior bothersome sx or complications until recent knee injury with knee brace and some increased stasis - Ambulatory referral to Vascular Surgery - US Venous Img Lower Unilateral Left  4. Acute embolism and thrombosis of superficial vein of left upper extremity Dx on 3/17 more distal left, with worsening sx to proximal thigh - US Venous Img Lower Unilateral Left - COMPLETE METABOLIC PANEL WITH GFR - CBC with Differential/Platelet  5. Encounter for medication monitoring  - COMPLETE METABOLIC PANEL WITH GFR - CBC with Differential/Platelet     April 25th [facility] - no sooner appts  Imaging from US venous Lower left on 3/17: IMPRESSION: 1. No evidence for left lower extremity deep venous thrombosis. 2.  Superficial venous thrombosis in varicosities in the subcutaneous tissues of the distal left lateral thigh above the knee  extending down to the level of the left mid calf. Imaging features compatible with superficial thrombophlebitis.  Sx and findings on physical exam are now much worse than when that study was done. Labs for baseline renal function and blood counts in case she needs NOAC Discussed topical heat/ice tx, NSAIDs - she will stop mobic, double up on omeprazole and take NSAID     Hold mobic and omeprazole Start pantoprazole 40 mg BID for GI protection and take ibuprofen TID prn, can take tylenol as directed on box or bottle Will be getting pt reimaged or into vascular stat  To be determined-  Whether to include anticoagulation as part of treatment and its dosing depends on the location and extent of the thrombus. Patients with SVT can be stratified as low, intermediate, or at elevated risk for developing VTE   Pt was instructed to go to the ER with continued worsening of swelling/pain if she developes new fever, wounds/purulent drainage, CP/SOB/tachycardia  Meds ordered this encounter  Medications   pantoprazole (PROTONIX) 40 MG tablet    Sig: Take 1 tablet (40 mg total) by mouth 2 (two) times daily for 14 days.    Dispense:  28 tablet    Refill:  0             ibuprofen (ADVIL) 600 MG tablet    Sig: Take 1 tablet (600 mg total) by mouth every 8 (eight) hours as needed for up to 7 days for moderate pain (thrombophlebitis).    Dispense:  21 tablet    Refill:  0             1. Phlebitis or thrombophlebitis of lower extremity - Ambulatory referral to Vascular Surgery - US Venous Img Lower Unilateral Left - ibuprofen (ADVIL) 600 MG tablet; Take 1 tablet (600 mg total) by mouth every 8 (eight) hours as needed for up to 7 days for moderate pain (thrombophlebitis).  Dispense: 21 tablet; Refill: 0  2. Edema of left lower leg - Ambulatory referral to Vascular Surgery - US Venous Img Lower Unilateral Left  3. Varicose veins of both lower extremities, unspecified whether complicated -  Ambulatory referral to Vascular Surgery - US Venous Img Lower Unilateral Left  4. Acute embolism and thrombosis of superficial vein of left upper extremity - US Venous Img Lower Unilateral Left - COMPLETE METABOLIC PANEL WITH GFR - CBC with Differential/Platelet - ibuprofen (ADVIL) 600 MG tablet; Take 1 tablet (600 mg total) by mouth every 8 (eight) hours as needed for up to 7 days for moderate pain (thrombophlebitis).  Dispense: 21 tablet; Refill: 0  5. Encounter for medication monitoring - COMPLETE METABOLIC PANEL WITH GFR - CBC with Differential/Platelet  6. Gastroesophageal reflux disease without esophagitis - pantoprazole (PROTONIX) 40 MG tablet; Take 1 tablet (40 mg total) by mouth 2 (two) times daily for 14 days.  Dispense: 28 tablet; Refill: 0    Delsa Grana, PA-C 06/15/22 10:03 AM

## 2022-06-16 LAB — CBC WITH DIFFERENTIAL/PLATELET
Absolute Monocytes: 227 cells/uL (ref 200–950)
Basophils Absolute: 38 cells/uL (ref 0–200)
Basophils Relative: 0.9 %
Eosinophils Absolute: 139 cells/uL (ref 15–500)
Eosinophils Relative: 3.3 %
HCT: 35.5 % (ref 35.0–45.0)
Hemoglobin: 11.8 g/dL (ref 11.7–15.5)
Lymphs Abs: 853 cells/uL (ref 850–3900)
MCH: 31.5 pg (ref 27.0–33.0)
MCHC: 33.2 g/dL (ref 32.0–36.0)
MCV: 94.7 fL (ref 80.0–100.0)
MPV: 10 fL (ref 7.5–12.5)
Monocytes Relative: 5.4 %
Neutro Abs: 2944 cells/uL (ref 1500–7800)
Neutrophils Relative %: 70.1 %
Platelets: 232 10*3/uL (ref 140–400)
RBC: 3.75 10*6/uL — ABNORMAL LOW (ref 3.80–5.10)
RDW: 13.1 % (ref 11.0–15.0)
Total Lymphocyte: 20.3 %
WBC: 4.2 10*3/uL (ref 3.8–10.8)

## 2022-06-16 LAB — COMPLETE METABOLIC PANEL WITH GFR
AG Ratio: 1.5 (calc) (ref 1.0–2.5)
ALT: 17 U/L (ref 6–29)
AST: 22 U/L (ref 10–35)
Albumin: 3.8 g/dL (ref 3.6–5.1)
Alkaline phosphatase (APISO): 63 U/L (ref 37–153)
BUN: 24 mg/dL (ref 7–25)
CO2: 26 mmol/L (ref 20–32)
Calcium: 8.8 mg/dL (ref 8.6–10.4)
Chloride: 108 mmol/L (ref 98–110)
Creat: 1 mg/dL (ref 0.50–1.05)
Globulin: 2.5 g/dL (calc) (ref 1.9–3.7)
Glucose, Bld: 99 mg/dL (ref 65–99)
Potassium: 4.2 mmol/L (ref 3.5–5.3)
Sodium: 142 mmol/L (ref 135–146)
Total Bilirubin: 0.4 mg/dL (ref 0.2–1.2)
Total Protein: 6.3 g/dL (ref 6.1–8.1)
eGFR: 61 mL/min/{1.73_m2} (ref >=60)

## 2022-06-18 NOTE — Progress Notes (Unsigned)
Name: Sarah Gordon   MRN: [id number]    DOB: [birth year]   Date:06/21/2022       Progress Note  Subjective  Chief Complaint  Follow Up  HPI  Doppler US left leg done 06/15/2022  1. Negative examination for deep venous thrombosis in the left lower extremity. 2. Superficial thrombophlebitis of subcutaneous varices of the proximal lateral left calf at the patient identified site of pain. 3. Fluid collection in the left popliteal fossa measuring 5.1 x 2.5 x 3.6 cm, characteristic in location and appearance for a Baker's cyst.   She states the pain and bruising has gone down, only one spot that seems to be new and tender to touch, she already has a visit scheduled with vascular surgeon in a few weeks, advised to continue heat and monitoring for progression in the mean time. Discussed results above with patient  DM: she stopped taking mounjarno due to nausea, epigastric, diarrhea, she will have labs done and return for regular follow up.   Patient Active Problem List   Diagnosis Date Noted   Cognitive complaints 10/27/2021   Polyp of colon    Lumbar radiculopathy 11/06/2020   Ventricular flutter 12/06/2018   MDD (major depressive disorder), recurrent episode, mild 09/04/2018   DDD (degenerative disc disease), cervical 01/23/2016   Degenerative joint disease of left acromioclavicular joint 01/23/2016   Recurrent oral herpes simplex infection 09/01/2015   Irritable bowel syndrome with diarrhea 08/07/2015   Statin intolerance 02/28/2015   Asthma, intermittent 09/10/2014   Controlled type 2 diabetes with neuropathy 09/10/2014   Fibromyalgia 09/10/2014   Obstructive sleep apnea 09/10/2014   Hyperlipidemia 12/29/2012   Essential hypertension 12/29/2012   Morbid obesity 12/29/2012   Arthritis due to gout 12/13/2007   Acid reflux 10/24/2006   Adult hypothyroidism 10/24/2006   Localized osteoarthrosis 10/24/2006    Past Surgical History:  Procedure Laterality Date   BREAST BIOPSY  Left    benign   COLONOSCOPY     Dr Alveta Heimlich   COLONOSCOPY WITH PROPOFOL N/A 11/19/2015   Procedure: COLONOSCOPY WITH PROPOFOL;  Surgeon: Christene Lye, MD;  Location: [facility] ENDOSCOPY;  Service: Endoscopy;  Laterality: N/A;   COLONOSCOPY WITH PROPOFOL N/A 06/18/2021   Procedure: COLONOSCOPY WITH PROPOFOL;  Surgeon: Lin Landsman, MD;  Location: [facility] ENDOSCOPY;  Service: Gastroenterology;  Laterality: N/A;   ESOPHAGOGASTRODUODENOSCOPY N/A 08/20/2014   Procedure: ESOPHAGOGASTRODUODENOSCOPY (EGD);  Surgeon: Christene Lye, MD;  Location: [facility] ENDOSCOPY;  Service: Endoscopy;  Laterality: N/A;   ESOPHAGOGASTRODUODENOSCOPY (EGD) WITH PROPOFOL N/A 06/18/2021   Procedure: ESOPHAGOGASTRODUODENOSCOPY (EGD) WITH PROPOFOL;  Surgeon: Lin Landsman, MD;  Location: [facility] ENDOSCOPY;  Service: Gastroenterology;  Laterality: N/A;   FOOT SURGERY Left    heel spur   HERNIA REPAIR     umbilical   NASAL SINUS SURGERY  [id number]   UMBILICAL HERNIA REPAIR      Family History  Problem Relation Age of Onset   Hypertension Mother    Hyperlipidemia Mother    Diabetes Mother    Heart disease Mother    Stroke Mother    Varicose Veins Mother    Alcohol abuse Father    Lupus Sister    Arthritis Sister    Depression Sister    Obesity Sister    Asthma Sister    Depression Sister    Hypertension Sister    Miscarriages / Stillbirths Sister    Varicose Veins Sister    Depression Sister    Arthritis Sister    Heart  disease Sister    Hyperlipidemia Sister    Hypertension Sister    11 / [place] Sister    Varicose Veins Sister    Miscarriages / Stillbirths Sister    Arthritis Sister    Depression Sister    Heart disease Sister    Hyperlipidemia Sister    Hypertension Sister    Heart disease Brother        CABG x 3    Heart attack Brother 36   Hyperlipidemia Brother    Hypertension Brother    Diabetes Brother    Hypertension Brother    Obesity Brother    Asthma Brother     Depression Brother    Hyperlipidemia Brother    Alcohol abuse Brother    Heart disease Brother    Stroke Brother    Varicose Veins Brother    Early death Brother    Stroke Brother    Arthritis Brother    Depression Brother    Hyperlipidemia Brother    Hypertension Brother    Stroke Maternal Grandfather    Breast cancer Neg Hx     Social History   Tobacco Use   Smoking status: Never   Smokeless tobacco: Never  Substance Use Topics   Alcohol use: No    Comment: RARELY     Current Outpatient Medications:    acetaminophen (TYLENOL) 650 MG CR tablet, Take 1,300 mg by mouth 2 (two) times daily., Disp: , Rfl:    albuterol (VENTOLIN HFA) 108 (90 Base) MCG/ACT inhaler, Inhale 2 puffs into the lungs every 6 (six) hours as needed for wheezing or shortness of breath., Disp: 1 each, Rfl: 2   baclofen (LIORESAL) 10 MG tablet, , Disp: , Rfl:    DULoxetine (CYMBALTA) 60 MG capsule, TAKE 1 CAPSULE BY MOUTH DAILY, Disp: 90 capsule, Rfl: 1   Evolocumab (REPATHA) 140 MG/ML SOSY, Inject 1 mL into the skin every 14 (fourteen) days., Disp: 2.1 mL, Rfl: 5   ezetimibe (ZETIA) 10 MG tablet, TAKE 1 TABLET BY MOUTH DAILY, Disp: 90 tablet, Rfl: 0   FLOVENT DISKUS 100 MCG/ACT AEPB, INHALE 2 PUFFS BY MOUTH TWICE A DAY, Disp: 60 each, Rfl: 1   hydrOXYzine (ATARAX) 10 MG tablet, Take 1 tablet (10 mg total) by mouth 3 (three) times daily as needed., Disp: 30 tablet, Rfl: 0   ibuprofen (ADVIL) 600 MG tablet, Take 1 tablet (600 mg total) by mouth every 8 (eight) hours as needed for up to 7 days for moderate pain (thrombophlebitis)., Disp: 21 tablet, Rfl: 0   levothyroxine (SYNTHROID) 100 MCG tablet, TAKE 1 TABLET BY MOUTH DAILY, Disp: 90 tablet, Rfl: 0   meclizine (ANTIVERT) 25 MG tablet, Take 1 tablet (25 mg total) by mouth 3 (three) times daily as needed for dizziness., Disp: 30 tablet, Rfl: 0   meloxicam (MOBIC) 15 MG tablet, Take 15 mg by mouth daily. Takes at night, Disp: , Rfl:    metoprolol succinate  (TOPROL-XL) 25 MG 24 hr tablet, Take 1 tablet (25 mg total) by mouth daily., Disp: 90 tablet, Rfl: 1   metoprolol tartrate (LOPRESSOR) 100 MG tablet, Take tablet (100mg ) TWO hours prior to your cardiac CT scan., Disp: 1 tablet, Rfl: 0   montelukast (SINGULAIR) 10 MG tablet, TAKE 1 TABLET BY MOUTH AT BEDTIME, Disp: 90 tablet, Rfl: 1   omeprazole (PRILOSEC) 20 MG capsule, TAKE ONE CAPSULE BY MOUTH DAILY, Disp: 90 capsule, Rfl: 0   pantoprazole (PROTONIX) 40 MG tablet, Take 1 tablet (40 mg total)  by mouth 2 (two) times daily for 14 days., Disp: 28 tablet, Rfl: 0   QUEtiapine (SEROQUEL) 25 MG tablet, TAKE 1 TABLET BY MOUTH AT BEDTIME, Disp: 90 tablet, Rfl: 0   tirzepatide (MOUNJARO) 2.5 MG/0.5ML Pen, Inject 2.5 mg into the skin once a week., Disp: 2 mL, Rfl: 0   tirzepatide (MOUNJARO) 5 MG/0.5ML Pen, Inject 5 mg into the skin once a week., Disp: 6 mL, Rfl: 0   traMADol (ULTRAM) 50 MG tablet, Take 50 mg by mouth daily as needed., Disp: , Rfl:    valsartan-hydrochlorothiazide (DIOVAN-HCT) 160-25 MG tablet, Take 1 tablet by mouth daily., Disp: 90 tablet, Rfl: 1  Allergies  Allergen Reactions   Vascepa [Icosapent Ethyl] Hives and Swelling   Aspir-81 [Aspirin] Other (See Comments)    Coagulation disorder   Gabapentin Other (See Comments)    Cognitive/memory issues   Statins Other (See Comments)   Topamax [Topiramate] Other (See Comments)    Cognitive issues    Voltaren [Diclofenac Sodium] Hives and Other (See Comments)   Lipitor [Atorvastatin]     Joint pain    I personally reviewed active problem list, medication list, allergies, family history, social history, health maintenance with the patient/caregiver today.   ROS  Ten systems reviewed and is negative except as mentioned in HPI   Objective  Vitals:   06/21/22 0909  BP: 132/82  Pulse: 87  Resp: 16  Weight: 280 lb (127 kg)  Height: 5\' 4"  (1.626 m)    Body mass index is 48.06 kg/m.  Physical Exam  Constitutional: Patient  appears well-developed and well-nourished. Obese  No distress.  HEENT: head atraumatic, normocephalic, pupils equal and reactive to light, neck supple, throat within normal limits Cardiovascular: Normal rate, regular rhythm and normal heart sounds.  No murmur heard. Trace bilateral low extremity edema, palpable varicose and markers outside the area of bruising and swelling  Pulmonary/Chest: Effort normal and breath sounds normal. No respiratory distress. Abdominal: Soft.  There is no tenderness. Psychiatric: Patient has a normal mood and affect. behavior is normal. Judgment and thought content normal.   Recent Results (from the past 2160 hour(s))  POCT HgB A1C     Status: Abnormal   Collection Time: 04/23/22 10:49 AM  Result Value Ref Range   Hemoglobin A1C 6.7 (A) 4.0 - 5.6 %   HbA1c POC (<> result, manual entry)     HbA1c, POC (prediabetic range)     HbA1c, POC (controlled diabetic range)    I-STAT creatinine     Status: Abnormal   Collection Time: 05/24/22  8:55 AM  Result Value Ref Range   Creatinine, Ser 1.10 (H) 0.44 - 1.00 mg/dL  COMPLETE METABOLIC PANEL WITH GFR     Status: None   Collection Time: 06/15/22 10:33 AM  Result Value Ref Range   Glucose, Bld 99 65 - 99 mg/dL    Comment: .            Fasting reference interval .    BUN 24 7 - 25 mg/dL   Creat 1.00 0.50 - 1.05 mg/dL   eGFR 61 > OR = 60 mL/min/1.21m2   BUN/Creatinine Ratio SEE NOTE: 6 - 22 (calc)    Comment:    Not Reported: BUN and Creatinine are within    reference range. .    Sodium 142 135 - 146 mmol/L   Potassium 4.2 3.5 - 5.3 mmol/L   Chloride 108 98 - 110 mmol/L   CO2 26 20 - 32 mmol/L  Calcium 8.8 8.6 - 10.4 mg/dL   Total Protein 6.3 6.1 - 8.1 g/dL   Albumin 3.8 3.6 - 5.1 g/dL   Globulin 2.5 1.9 - 3.7 g/dL (calc)   AG Ratio 1.5 1.0 - 2.5 (calc)   Total Bilirubin 0.4 0.2 - 1.2 mg/dL   Alkaline phosphatase (APISO) 63 37 - 153 U/L   AST 22 10 - 35 U/L   ALT 17 6 - 29 U/L  CBC with  Differential/Platelet     Status: Abnormal   Collection Time: 06/15/22 10:33 AM  Result Value Ref Range   WBC 4.2 3.8 - 10.8 Thousand/uL   RBC 3.75 (L) 3.80 - 5.10 Million/uL   Hemoglobin 11.8 11.7 - 15.5 g/dL   HCT 35.5 35.0 - 45.0 %   MCV 94.7 80.0 - 100.0 fL   MCH 31.5 27.0 - 33.0 pg   MCHC 33.2 32.0 - 36.0 g/dL   RDW 13.1 11.0 - 15.0 %   Platelets 232 140 - 400 Thousand/uL   MPV 10.0 7.5 - 12.5 fL   Neutro Abs 2,944 1,500 - 7,800 cells/uL   Lymphs Abs 853 850 - 3,900 cells/uL   Absolute Monocytes 227 200 - 950 cells/uL   Eosinophils Absolute 139 15 - 500 cells/uL   Basophils Absolute 38 0 - 200 cells/uL   Neutrophils Relative % 70.1 %   Total Lymphocyte 20.3 %   Monocytes Relative 5.4 %   Eosinophils Relative 3.3 %   Basophils Relative 0.9 %    PHQ2/9:    06/21/2022    9:08 AM 06/15/2022    9:42 AM 06/04/2022   11:03 AM 04/23/2022   10:44 AM 02/23/2022    9:36 AM  Depression screen PHQ 2/9  Decreased Interest 0 0 1 0 1  Down, Depressed, Hopeless 0 0 1 0 1  PHQ - 2 Score 0 0 2 0 2  Altered sleeping 1 0 0 2 0  Tired, decreased energy 0 0 0 0 0  Change in appetite 0 0 0 0 0  Feeling bad or failure about yourself  0 0 0 0 0  Trouble concentrating 0 0 0 0 0  Moving slowly or fidgety/restless 0 0 0 0 0  Suicidal thoughts 0 0 0 0 0  PHQ-9 Score 1 0 2 2 2   Difficult doing work/chores  Not difficult at all   Not difficult at all    phq 9 is negative   Fall Risk:    06/21/2022    9:08 AM 06/15/2022    9:42 AM 06/04/2022   10:57 AM 04/23/2022   10:43 AM 02/23/2022    9:36 AM  Fall Risk   Falls in the past year? 1 1 1  0 1  Number falls in past yr: 0 0 0 0 1  Injury with Fall? 0 0 0 0 0  Risk for fall due to : No Fall Risks Impaired balance/gait No Fall Risks No Fall Risks Impaired balance/gait  Follow up Falls prevention discussed Falls prevention discussed;Education provided;Falls evaluation completed Education provided;Falls prevention discussed Falls prevention discussed  Falls prevention discussed;Education provided;Falls evaluation completed      Functional Status Survey: Is the patient deaf or have difficulty hearing?: No Does the patient have difficulty seeing, even when wearing glasses/contacts?: No Does the patient have difficulty concentrating, remembering, or making decisions?: Yes Does the patient have difficulty walking or climbing stairs?: Yes Does the patient have difficulty dressing or bathing?: No Does the patient have difficulty doing errands alone  such as visiting a doctor's office or shopping?: No    Assessment & Plan  1. Acute embolism and thrombosis of superficial vein of left upper extremity  Keep follow up with vascular surgeon Triggered by a knee brace after a fall   2. Controlled type 2 diabetes with neuropathy  - Lipid panel - Microalbumin / creatinine urine ratio - Hemoglobin A1c  3. Breast cancer screening by mammogram  - MM 3D SCREENING MAMMOGRAM BILATERAL BREAST; Future  4. Adult hypothyroidism  - TSH  5. Dyslipidemia associated with type 2 diabetes mellitus  - Lipid panel  6. B12 deficiency  - B12 and Folate Panel

## 2022-06-21 ENCOUNTER — Encounter: Payer: Self-pay | Admitting: Family Medicine

## 2022-06-21 ENCOUNTER — Ambulatory Visit (INDEPENDENT_AMBULATORY_CARE_PROVIDER_SITE_OTHER): Payer: Medicare HMO | Admitting: Family Medicine

## 2022-06-21 VITALS — BP 132/82 | HR 87 | Resp 16 | Ht 64.0 in | Wt 280.0 lb

## 2022-06-21 DIAGNOSIS — Z1231 Encounter for screening mammogram for malignant neoplasm of breast: Secondary | ICD-10-CM | POA: Diagnosis not present

## 2022-06-21 DIAGNOSIS — I82612 Acute embolism and thrombosis of superficial veins of left upper extremity: Secondary | ICD-10-CM

## 2022-06-21 DIAGNOSIS — E785 Hyperlipidemia, unspecified: Secondary | ICD-10-CM

## 2022-06-21 DIAGNOSIS — E1169 Type 2 diabetes mellitus with other specified complication: Secondary | ICD-10-CM | POA: Diagnosis not present

## 2022-06-21 DIAGNOSIS — E1140 Type 2 diabetes mellitus with diabetic neuropathy, unspecified: Secondary | ICD-10-CM | POA: Diagnosis not present

## 2022-06-21 DIAGNOSIS — E039 Hypothyroidism, unspecified: Secondary | ICD-10-CM

## 2022-06-21 DIAGNOSIS — E538 Deficiency of other specified B group vitamins: Secondary | ICD-10-CM

## 2022-06-26 ENCOUNTER — Other Ambulatory Visit: Payer: Self-pay | Admitting: Family Medicine

## 2022-06-26 DIAGNOSIS — K219 Gastro-esophageal reflux disease without esophagitis: Secondary | ICD-10-CM

## 2022-07-14 ENCOUNTER — Other Ambulatory Visit (INDEPENDENT_AMBULATORY_CARE_PROVIDER_SITE_OTHER): Payer: Self-pay | Admitting: Vascular Surgery

## 2022-07-14 DIAGNOSIS — I83892 Varicose veins of left lower extremities with other complications: Secondary | ICD-10-CM

## 2022-07-14 NOTE — Progress Notes (Signed)
MRN : [id number]  Sarah Gordon is a 72 y.o. (10/03/1950) female who presents with chief complaint of legs hurt and swell.  History of Present Illness:   Patient is seen for evaluation of leg pain and leg swelling. The patient first noticed the swelling remotely. The swelling is associated with pain and discoloration. The pain and swelling worsens with prolonged dependency and improves with elevation. The pain is unrelated to activity.  The patient notes that in the morning the legs are significantly improved but they steadily worsened throughout the course of the day. The patient also notes a steady worsening of the discoloration in the ankle and shin area.   The patient denies claudication symptoms.  The patient denies symptoms consistent with rest pain.  The patient denies and extensive history of DJD and LS spine disease.  The patient has no had any past angiography, interventions or vascular surgery.  Elevation makes the leg symptoms better, dependency makes them much worse. There is no history of ulcerations. The patient denies any recent changes in medications.  The patient has not been wearing graduated compression.  The patient denies a history of DVT or PE. There is no prior history of phlebitis. There is no history of primary lymphedema.  No history of malignancies. No history of trauma or groin or pelvic surgery. There is no history of radiation treatment to the groin or pelvis  The patient denies amaurosis fugax or recent TIA symptoms. There are no recent neurological changes noted. The patient denies recent episodes of angina or shortness of breath.  Duplex ultrasound of the venous system obtained today bilateral lower extremities demonstrates normal deep venous system bilaterally.  There is severe venous reflux in the great saphenous distribution bilaterally.  Small saphenous vein is competent bilaterally.  No outpatient medications have been marked as taking  for the 07/15/22 encounter (Appointment) with Gilda Crease, Latina Craver, MD.    Past Medical History:  Diagnosis Date   Allergy    Anxiety '95   Asthma    Cataract    Depression '95   Esophageal reflux    Gouty arthropathy, unspecified    Herpes simplex without mention of complication    Irritable bowel syndrome    Localized osteoarthrosis not specified whether primary or secondary, unspecified site    Migraine, unspecified, without mention of intractable migraine without mention of status migrainosus    Mixed hyperlipidemia    Myalgia and myositis, unspecified    Obstructive sleep apnea (adult) (pediatric)    Palpitations    Sleep apnea    Type II or unspecified type diabetes mellitus without mention of complication, not stated as uncontrolled    Umbilical hernia without mention of obstruction or gangrene    Unspecified essential hypertension    Unspecified hypothyroidism     Past Surgical History:  Procedure Laterality Date   BREAST BIOPSY Left    benign   COLONOSCOPY     Dr Thurmond Butts   COLONOSCOPY WITH PROPOFOL N/A 11/19/2015   Procedure: COLONOSCOPY WITH PROPOFOL;  Surgeon: Kieth Brightly, MD;  Location: [facility] ENDOSCOPY;  Service: Endoscopy;  Laterality: N/A;   COLONOSCOPY WITH PROPOFOL N/A 06/18/2021   Procedure: COLONOSCOPY WITH PROPOFOL;  Surgeon: Toney Reil, MD;  Location: [facility] ENDOSCOPY;  Service: Gastroenterology;  Laterality: N/A;   ESOPHAGOGASTRODUODENOSCOPY N/A 08/20/2014   Procedure: ESOPHAGOGASTRODUODENOSCOPY (EGD);  Surgeon: Kieth Brightly, MD;  Location: [facility] ENDOSCOPY;  Service: Endoscopy;  Laterality: N/A;  ESOPHAGOGASTRODUODENOSCOPY (EGD) WITH PROPOFOL N/A 06/18/2021   Procedure: ESOPHAGOGASTRODUODENOSCOPY (EGD) WITH PROPOFOL;  Surgeon: Toney Reil, MD;  Location: [facility] ENDOSCOPY;  Service: Gastroenterology;  Laterality: N/A;   FOOT SURGERY Left    heel spur   HERNIA REPAIR     umbilical   NASAL SINUS SURGERY  1980s   UMBILICAL HERNIA  REPAIR      Social History Social History   Tobacco Use   Smoking status: Never   Smokeless tobacco: Never  Vaping Use   Vaping Use: Never used  Substance Use Topics   Alcohol use: No    Comment: RARELY   Drug use: No    Family History Family History  Problem Relation Age of Onset   Hypertension Mother    Hyperlipidemia Mother    Diabetes Mother    Heart disease Mother    Stroke Mother    Varicose Veins Mother    Alcohol abuse Father    Lupus Sister    Arthritis Sister    Depression Sister    Obesity Sister    Asthma Sister    Depression Sister    Hypertension Sister    Miscarriages / Stillbirths Sister    Varicose Veins Sister    Depression Sister    Arthritis Sister    Heart disease Sister    Hyperlipidemia Sister    Hypertension Sister    Miscarriages / Stillbirths Sister    Varicose Veins Sister    Miscarriages / Stillbirths Sister    Arthritis Sister    Depression Sister    Heart disease Sister    Hyperlipidemia Sister    Hypertension Sister    Heart disease Brother        CABG x 3    Heart attack Brother 69   Hyperlipidemia Brother    Hypertension Brother    Diabetes Brother    Hypertension Brother    Obesity Brother    Asthma Brother    Depression Brother    Hyperlipidemia Brother    Alcohol abuse Brother    Heart disease Brother    Stroke Brother    Varicose Veins Brother    Early death Brother    Stroke Brother    Arthritis Brother    Depression Brother    Hyperlipidemia Brother    Hypertension Brother    Stroke Maternal Grandfather    Breast cancer Neg Hx     Allergies  Allergen Reactions   Vascepa [Icosapent Ethyl] Hives and Swelling   Aspir-81 [Aspirin] Other (See Comments)    Coagulation disorder   Gabapentin Other (See Comments)    Cognitive/memory issues   Mounjaro [Tirzepatide] Diarrhea    Epigastric pain    Statins Other (See Comments)   Topamax [Topiramate] Other (See Comments)    Cognitive issues    Voltaren  [Diclofenac Sodium] Hives and Other (See Comments)   Lipitor [Atorvastatin]     Joint pain     REVIEW OF SYSTEMS (Negative unless checked)  Constitutional: [] Weight loss  [] Fever  [] Chills Cardiac: [] Chest pain   [] Chest pressure   [] Palpitations   [] Shortness of breath when laying flat   [] Shortness of breath with exertion. Vascular:  [] Pain in legs with walking   [x] Pain in legs at rest  [] History of DVT   [] Phlebitis   [x] Swelling in legs   [] Varicose veins   [] Non-healing ulcers Pulmonary:   [] Uses home oxygen   [] Productive cough   [] Hemoptysis   [] Wheeze  [] COPD   [] Asthma Neurologic:  []   Dizziness   [] Seizures   [] History of stroke   [] History of TIA  [] Aphasia   [] Vissual changes   [] Weakness or numbness in arm   [x] Weakness or numbness in leg Musculoskeletal:   [] Joint swelling   [x] Joint pain   [x] Low back pain Hematologic:  [] Easy bruising  [] Easy bleeding   [] Hypercoagulable state   [] Anemic Gastrointestinal:  [] Diarrhea   [] Vomiting  [x] Gastroesophageal reflux/heartburn   [] Difficulty swallowing. Genitourinary:  [] Chronic kidney disease   [] Difficult urination  [] Frequent urination   [] Blood in urine Skin:  [] Rashes   [] Ulcers  Psychological:  [] History of anxiety   []  History of major depression.  Physical Examination  There were no vitals filed for this visit. There is no height or weight on file to calculate BMI. Gen: WD/WN, NAD Head: [place]/AT, No temporalis wasting.  Ear/Nose/Throat: Hearing grossly intact, nares w/o erythema or drainage, pinna without lesions Eyes: PER, EOMI, sclera nonicteric.  Neck: Supple, no gross masses.  No JVD.  Pulmonary:  Good air movement, no audible wheezing, no use of accessory muscles.  Cardiac: RRR, precordium not hyperdynamic. Vascular: Large varicosities present bilaterally in the great saphenous distribution as well as the anterior lateral saphenous vein bilaterally.  These varicosities measure 12 to 15 mm in diameter and are tender to  palpation..  Moderate venous stasis changes to the legs bilaterally.  2+ soft pitting edema. CEAP C4sEpAsPr   Vessel Right Left  Radial Palpable Palpable  Gastrointestinal: soft, non-distended. No guarding/no peritoneal signs.  Musculoskeletal: M/S 5/5 throughout.  No deformity.  Neurologic: CN 2-12 intact. Pain and light touch intact in extremities.  Symmetrical.  Speech is fluent. Motor exam as listed above. Psychiatric: Judgment intact, Mood & affect appropriate for pt's clinical situation. Dermatologic: Venous rashes no ulcers noted.  No changes consistent with cellulitis. Lymph : No lichenification or skin changes of chronic lymphedema.  CBC Lab Results  Component Value Date   WBC 4.2 06/15/2022   HGB 11.8 06/15/2022   HCT 35.5 06/15/2022   MCV 94.7 06/15/2022   PLT 232 06/15/2022    BMET    Component Value Date/Time   NA 142 06/15/2022 1033   NA 144 09/11/2014 0948   K 4.2 06/15/2022 1033   CL 108 06/15/2022 1033   CO2 26 06/15/2022 1033   GLUCOSE 99 06/15/2022 1033   BUN 24 06/15/2022 1033   BUN 21 09/11/2014 0948   CREATININE 1.00 06/15/2022 1033   CALCIUM 8.8 06/15/2022 1033   GFRNONAA 57 (L) 02/20/2022 1258   GFRNONAA 70 05/23/2020 1020   GFRAA 81 05/23/2020 1020   CrCl cannot be calculated (Patient's most recent lab result is older than the maximum 21 days allowed.).  COAG No results found for: "INR", "PROTIME"  Radiology US Venous Img Lower Unilateral Left  Result Date: 06/15/2022 CLINICAL DATA:  New worsening left leg swelling redness and pain EXAM: LEFT LOWER EXTREMITY VENOUS DOPPLER ULTRASOUND TECHNIQUE: Gray-scale sonography with compression, as well as color and duplex ultrasound, were performed to evaluate the deep venous system(s) from the level of the common femoral vein through the popliteal and proximal calf veins. COMPARISON:  06/06/2022 FINDINGS: VENOUS Normal compressibility of the common femoral, superficial femoral, and popliteal veins, as well  as the visualized calf veins. Visualized portions of profunda femoral vein and great saphenous vein unremarkable. No filling defects to suggest DVT on grayscale or color Doppler imaging. Doppler waveforms show normal direction of venous flow, normal respiratory plasticity and response to augmentation. Limited views of  the contralateral common femoral vein are unremarkable. OTHER Superficial thrombophlebitis of subcutaneous varices of the proximal lateral left calf at the patient identified site of pain. Fluid collection in the left popliteal fossa measuring 5.1 x 2.5 x 3.6 cm. Limitations: none IMPRESSION: 1. Negative examination for deep venous thrombosis in the left lower extremity. 2. Superficial thrombophlebitis of subcutaneous varices of the proximal lateral left calf at the patient identified site of pain. 3. Fluid collection in the left popliteal fossa measuring 5.1 x 2.5 x 3.6 cm, characteristic in location and appearance for a Baker's cyst. Electronically Signed   By: Jearld Lesch M.D.   On: 06/15/2022 15:44     Assessment/Plan 1. Varicose veins with inflammation  Recommend:  The patient has large symptomatic varicose veins that are painful and associated with swelling. The patient is CEAP C4sEpAsPr   I have had a long discussion with the patient regarding  varicose veins and why they cause symptoms.  Patient will begin wearing graduated compression stockings class 1 on a daily basis, beginning first thing in the morning and removing them in the evening. The patient is instructed specifically not to sleep in the stockings.    The patient  will also begin using over-the-counter analgesics such as Motrin 600 mg po TID to help control the symptoms.    In addition, behavioral modification including elevation during the day will be initiated.    Pending the results of these changes the  patient will be reevaluated in three months.   She is already had her noninvasive study today.  Duplex  ultrasound of the venous system obtained today bilateral lower extremities demonstrates normal deep venous system bilaterally.  There is severe venous reflux in the great saphenous distribution bilaterally.  Small saphenous vein is competent bilaterally.  Pending the results of conservative therapy bilateral laser ablation of the great saphenous veins would be indicated.  2. Essential hypertension Continue antihypertensive medications as already ordered, these medications have been reviewed and there are no changes at this time.  3. Mild intermittent asthma without complication Continue pulmonary medications and aerosols as already ordered, these medications have been reviewed and there are no changes at this time.   4. Gastroesophageal reflux disease, unspecified whether esophagitis present Continue PPI as already ordered, this medication has been reviewed and there are no changes at this time.  Avoidence of caffeine and alcohol  Moderate elevation of the head of the bed   5. Mixed hyperlipidemia Continue statin as ordered and reviewed, no changes at this time    Levora Dredge, MD  07/14/2022 10:47 AM

## 2022-07-15 ENCOUNTER — Ambulatory Visit (INDEPENDENT_AMBULATORY_CARE_PROVIDER_SITE_OTHER): Payer: Medicare HMO

## 2022-07-15 ENCOUNTER — Ambulatory Visit (INDEPENDENT_AMBULATORY_CARE_PROVIDER_SITE_OTHER): Payer: Medicare HMO | Admitting: Vascular Surgery

## 2022-07-15 VITALS — BP 189/109 | HR 73 | Resp 17 | Ht 66.0 in | Wt 278.0 lb

## 2022-07-15 DIAGNOSIS — I10 Essential (primary) hypertension: Secondary | ICD-10-CM | POA: Diagnosis not present

## 2022-07-15 DIAGNOSIS — I8310 Varicose veins of unspecified lower extremity with inflammation: Secondary | ICD-10-CM | POA: Diagnosis not present

## 2022-07-15 DIAGNOSIS — I83892 Varicose veins of left lower extremities with other complications: Secondary | ICD-10-CM | POA: Diagnosis not present

## 2022-07-15 DIAGNOSIS — J4520 Mild intermittent asthma, uncomplicated: Secondary | ICD-10-CM | POA: Diagnosis not present

## 2022-07-15 DIAGNOSIS — K219 Gastro-esophageal reflux disease without esophagitis: Secondary | ICD-10-CM | POA: Diagnosis not present

## 2022-07-15 DIAGNOSIS — E782 Mixed hyperlipidemia: Secondary | ICD-10-CM | POA: Diagnosis not present

## 2022-07-16 ENCOUNTER — Other Ambulatory Visit: Payer: Self-pay | Admitting: Family Medicine

## 2022-07-16 ENCOUNTER — Ambulatory Visit: Payer: Self-pay | Admitting: *Deleted

## 2022-07-16 ENCOUNTER — Ambulatory Visit: Payer: Self-pay

## 2022-07-16 ENCOUNTER — Encounter (INDEPENDENT_AMBULATORY_CARE_PROVIDER_SITE_OTHER): Payer: Self-pay | Admitting: Vascular Surgery

## 2022-07-16 DIAGNOSIS — I8310 Varicose veins of unspecified lower extremity with inflammation: Secondary | ICD-10-CM | POA: Insufficient documentation

## 2022-07-16 DIAGNOSIS — I10 Essential (primary) hypertension: Secondary | ICD-10-CM

## 2022-07-16 NOTE — Telephone Encounter (Signed)
Reason for Disposition  Systolic BP  >= 180 OR Diastolic >= 110  Answer Assessment - Initial Assessment Questions 1. BLOOD PRESSURE: "What is the blood pressure?" "Did you take at least two measurements 5 minutes apart?"     My BP is very high.   I saw the vascular dr yesterday 177/109.   This morning 189/109 at home. 2. ONSET: "When did you take your blood pressure?"     This morning 189/109 3. HOW: "How did you take your blood pressure?" (e.g., automatic home BP monitor, visiting nurse)     Home kit 4. HISTORY: "Do you have a history of high blood pressure?"     Yes 5. MEDICINES: "Are you taking any medicines for blood pressure?" "Have you missed any doses recently?"     Yes  I take them every day.     I woke up with a really bad headache this morning. 6. OTHER SYMPTOMS: "Do you have any symptoms?" (e.g., blurred vision, chest pain, difficulty breathing, headache, weakness)     Headache really bad 7. PREGNANCY: "Is there any chance you are pregnant?" "When was your last menstrual period?"     N/A due to age  Protocols used: Blood Pressure - High-A-AH  Chief Complaint: BP elevated 189/109 this morning and has a really bad headache. Symptoms: Yesterday at vascular dr. Appt. Her BP was 177/109.  Taking her BP medicines as directed.   Frequency: Noted yesterday at dr. Appt with vascular specialist.  Not been having problems with it.  (Noted BP to be 132/82 at [facility] on 06/21/2022) Pertinent Negatives: Patient denies any other symptoms other than the headache. Disposition: [] ED /[] Urgent Care (no appt availability in office) / [x] Appointment(In office/virtual)/ []  [facility]/ [] Home Care/ [] Refused Recommended Disposition /[]  Mobile Bus/ []  Follow-up with PCP Additional Notes: Made her an appt with Jacquelin Hawking, PA-C for Monday 07/19/2022 at 8:20 with instructions to go to the ED if she developed any of the symptoms listed in the care advice.    Encouraged her to use Tylenol or  ibuprofen for the headache which she said she is doing.

## 2022-07-16 NOTE — Telephone Encounter (Signed)
Summary: High Blood Pressue   Pt says her blood pressure is currently 189/109 and she is experiencing a headache.      Called pt - left message on machine to return call.

## 2022-07-16 NOTE — Telephone Encounter (Signed)
Summary: High Blood Pressue   Pt says her blood pressure is currently 189/109 and she is experiencing a headache.         Called pt - left message on machine.  Will forward encounter to clinic for follow up, per protocol after 3 attempts.

## 2022-07-16 NOTE — Telephone Encounter (Signed)
Pt says her blood pressure is currently 189/109 and she is experiencing a headache.    Left message to call back about symptoms.

## 2022-07-19 ENCOUNTER — Ambulatory Visit (INDEPENDENT_AMBULATORY_CARE_PROVIDER_SITE_OTHER): Payer: Medicare HMO | Admitting: Physician Assistant

## 2022-07-19 ENCOUNTER — Encounter: Payer: Self-pay | Admitting: Physician Assistant

## 2022-07-19 VITALS — BP 128/84 | HR 87 | Temp 98.2°F | Resp 16 | Wt 271.3 lb

## 2022-07-19 DIAGNOSIS — I10 Essential (primary) hypertension: Secondary | ICD-10-CM

## 2022-07-19 NOTE — Telephone Encounter (Signed)
Seeing Sarah Gordon today

## 2022-07-19 NOTE — Progress Notes (Signed)
Acute Office Visit   Patient: Sarah Gordon   DOB: 1950/06/30   72 y.o. Female  MRN: [id number] Visit Date: 07/19/2022  Today's healthcare provider: Oswaldo Conroy Sharnay Cashion, PA-C  Introduced myself to the patient as a Secondary school teacher and provided education on APPs in clinical practice.    Chief Complaint  Patient presents with   Hypertension    Concern for elevated BP over the weekend   Subjective    HPI HPI     Hypertension    Additional comments: Concern for elevated BP over the weekend      Last edited by Rosalynn Sergent, Oswaldo Conroy, PA-C on 07/19/2022  8:39 AM.      She reports some concerns for high blood pressures for the last week She states her highest reading at home was Sat 195/109  She states the last time she spoke with her PCP there were medication changes and she thought she was supposed to stop taking the Valsartan so she was off this for the past week  She states she has since restarted it and has had two doses as of today's apt  She reports headaches and fatigue over the last week - they are not as bad since she restarted the Valsartan  but are still there.   She reports increased depression over the last few days due to family concerns  She reports her current doses of Duloxetine, Hydroxyzine and Seroquel are managing her mood symptoms satisfactorily     Medications: Outpatient Medications Prior to Visit  Medication Sig   acetaminophen (TYLENOL) 650 MG CR tablet Take 1,300 mg by mouth 2 (two) times daily.   albuterol (VENTOLIN HFA) 108 (90 Base) MCG/ACT inhaler Inhale 2 puffs into the lungs every 6 (six) hours as needed for wheezing or shortness of breath.   DULoxetine (CYMBALTA) 60 MG capsule TAKE 1 CAPSULE BY MOUTH DAILY   ezetimibe (ZETIA) 10 MG tablet TAKE 1 TABLET BY MOUTH DAILY   FLOVENT DISKUS 100 MCG/ACT AEPB INHALE 2 PUFFS BY MOUTH TWICE A DAY   hydrOXYzine (ATARAX) 10 MG tablet Take 1 tablet (10 mg total) by mouth 3 (three) times daily as needed.   levothyroxine  (SYNTHROID) 100 MCG tablet TAKE 1 TABLET BY MOUTH DAILY   meloxicam (MOBIC) 15 MG tablet Take 15 mg by mouth daily. Takes at night   metoprolol succinate (TOPROL-XL) 25 MG 24 hr tablet TAKE 1 TABLET BY MOUTH DAILY   montelukast (SINGULAIR) 10 MG tablet TAKE 1 TABLET BY MOUTH AT BEDTIME   omeprazole (PRILOSEC) 20 MG capsule TAKE ONE CAPSULE BY MOUTH DAILY   QUEtiapine (SEROQUEL) 25 MG tablet TAKE 1 TABLET BY MOUTH AT BEDTIME   traMADol (ULTRAM) 50 MG tablet Take 50 mg by mouth daily as needed.   valsartan-hydrochlorothiazide (DIOVAN-HCT) 160-25 MG tablet Take 1 tablet by mouth daily.   meclizine (ANTIVERT) 25 MG tablet Take 1 tablet (25 mg total) by mouth 3 (three) times daily as needed for dizziness. (Patient not taking: Reported on 07/15/2022)   No facility-administered medications prior to visit.    Review of Systems  Constitutional:  Positive for fatigue.  Eyes:  Negative for photophobia and visual disturbance.  Respiratory:  Negative for chest tightness and shortness of breath.   Cardiovascular:  Negative for chest pain, palpitations and leg swelling.  Neurological:  Positive for headaches. Negative for dizziness, speech difficulty, weakness and light-headedness.  Psychiatric/Behavioral:  Positive for dysphoric mood. Negative for confusion and decreased  concentration.        Objective    BP 128/84 (Patient Position: Sitting, Cuff Size: Large)   Pulse 87   Temp 98.2 F (36.8 C) (Oral)   Resp 16   Wt 271 lb 4.8 oz (123.1 kg)   SpO2 96%   BMI 43.79 kg/m    Physical Exam Vitals reviewed.  Constitutional:      General: She is awake.     Appearance: Normal appearance. She is well-developed and well-groomed.  HENT:     Head: Normocephalic and atraumatic.  Cardiovascular:     Rate and Rhythm: Normal rate and regular rhythm.     Pulses: Normal pulses.          Radial pulses are 2+ on the right side and 2+ on the left side.     Heart sounds: Normal heart sounds. No murmur  heard.    No friction rub. No gallop.  Pulmonary:     Effort: Pulmonary effort is normal.     Breath sounds: Normal breath sounds. No decreased air movement. No decreased breath sounds, wheezing, rhonchi or rales.  Musculoskeletal:     Right lower leg: No edema.     Left lower leg: No edema.  Neurological:     General: No focal deficit present.     Mental Status: She is alert and oriented to person, place, and time. Mental status is at baseline.     Cranial Nerves: Cranial nerves 2-12 are intact.  Psychiatric:        Attention and Perception: Attention and perception normal.        Mood and Affect: Mood and affect normal.        Speech: Speech normal.        Behavior: Behavior normal. Behavior is cooperative.        Thought Content: Thought content normal.        Cognition and Memory: Cognition normal.       No results found for any visits on 07/19/22.  Assessment & Plan      No follow-ups on file.       Problem List Items Addressed This Visit       Cardiovascular and Mediastinum   Essential hypertension - Primary    Chronic, historic condition She reports that she stopped taking her Valsartan-HCTZ 160-25 mg PO QD last week and noticed her BP was elevated. She has since resumed taking it for the past 2 days and notes improvement to her headaches and BP is back in goal.  Recommend she continues to take her medication as directed from now on Follow up as needed for persistent or progressing symptoms          No follow-ups on file.   I, Hart Haas E Colyn Miron, PA-C, have reviewed all documentation for this visit. The documentation on 07/19/22 for the exam, diagnosis, procedures, and orders are all accurate and complete.   Jacquelin Hawking, MHS, PA-C [facility]

## 2022-07-19 NOTE — Assessment & Plan Note (Signed)
Chronic, historic condition She reports that she stopped taking her Valsartan-HCTZ 160-25 mg PO QD last week and noticed her BP was elevated. She has since resumed taking it for the past 2 days and notes improvement to her headaches and BP is back in goal.  Recommend she continues to take her medication as directed from now on Follow up as needed for persistent or progressing symptoms

## 2022-08-12 ENCOUNTER — Other Ambulatory Visit: Payer: Self-pay | Admitting: Family Medicine

## 2022-08-12 DIAGNOSIS — E039 Hypothyroidism, unspecified: Secondary | ICD-10-CM

## 2022-08-13 NOTE — Telephone Encounter (Signed)
Pt has about a week left of medication. She does have upcoming appt on June 11th. Pt informed of stopping by between 8-11 & 130-330 for lab work and that we will be closed on Monday for the holiday. She verbalized understanding.

## 2022-08-20 DIAGNOSIS — E1169 Type 2 diabetes mellitus with other specified complication: Secondary | ICD-10-CM | POA: Diagnosis not present

## 2022-08-20 DIAGNOSIS — E1140 Type 2 diabetes mellitus with diabetic neuropathy, unspecified: Secondary | ICD-10-CM | POA: Diagnosis not present

## 2022-08-20 DIAGNOSIS — E039 Hypothyroidism, unspecified: Secondary | ICD-10-CM | POA: Diagnosis not present

## 2022-08-20 DIAGNOSIS — E785 Hyperlipidemia, unspecified: Secondary | ICD-10-CM | POA: Diagnosis not present

## 2022-08-20 DIAGNOSIS — E538 Deficiency of other specified B group vitamins: Secondary | ICD-10-CM | POA: Diagnosis not present

## 2022-08-21 LAB — HEMOGLOBIN A1C
Hgb A1c MFr Bld: 6.5 % of total Hgb — ABNORMAL HIGH (ref <5.7)
Mean Plasma Glucose: 140 mg/dL
eAG (mmol/L): 7.7 mmol/L

## 2022-08-21 LAB — MICROALBUMIN / CREATININE URINE RATIO
Creatinine, Urine: 88 mg/dL (ref 20–275)
Microalb, Ur: <0.2 mg/dL

## 2022-08-21 LAB — B12 AND FOLATE PANEL
Folate: 5.7 ng/mL
Vitamin B-12: 933 pg/mL (ref 200–1100)

## 2022-08-21 LAB — LIPID PANEL
Cholesterol: 241 mg/dL — ABNORMAL HIGH (ref <200)
HDL: 44 mg/dL — ABNORMAL LOW (ref >=50)
LDL Cholesterol (Calc): 147 mg/dL (calc) — ABNORMAL HIGH
Non-HDL Cholesterol (Calc): 197 mg/dL (calc) — ABNORMAL HIGH (ref <130)
Total CHOL/HDL Ratio: 5.5 (calc) — ABNORMAL HIGH (ref <5.0)
Triglycerides: 325 mg/dL — ABNORMAL HIGH (ref <150)

## 2022-08-21 LAB — TSH: TSH: 2.47 mIU/L (ref 0.40–4.50)

## 2022-08-24 ENCOUNTER — Ambulatory Visit
Admission: RE | Admit: 2022-08-24 | Discharge: 2022-08-24 | Disposition: A | Discharge status: Home / Self Care | Payer: Medicare HMO | Source: Ambulatory Visit | Attending: Family Medicine | Admitting: Family Medicine

## 2022-08-24 ENCOUNTER — Other Ambulatory Visit: Payer: Self-pay | Admitting: Family Medicine

## 2022-08-24 DIAGNOSIS — Z1231 Encounter for screening mammogram for malignant neoplasm of breast: Secondary | ICD-10-CM | POA: Diagnosis not present

## 2022-08-24 DIAGNOSIS — Z13820 Encounter for screening for osteoporosis: Secondary | ICD-10-CM | POA: Insufficient documentation

## 2022-08-24 DIAGNOSIS — Z780 Asymptomatic menopausal state: Secondary | ICD-10-CM | POA: Insufficient documentation

## 2022-08-24 DIAGNOSIS — J45909 Unspecified asthma, uncomplicated: Secondary | ICD-10-CM | POA: Diagnosis not present

## 2022-08-24 DIAGNOSIS — E039 Hypothyroidism, unspecified: Secondary | ICD-10-CM | POA: Diagnosis not present

## 2022-08-24 DIAGNOSIS — F3340 Major depressive disorder, recurrent, in remission, unspecified: Secondary | ICD-10-CM

## 2022-08-30 DIAGNOSIS — G3184 Mild cognitive impairment, so stated: Secondary | ICD-10-CM | POA: Diagnosis not present

## 2022-08-30 DIAGNOSIS — G479 Sleep disorder, unspecified: Secondary | ICD-10-CM | POA: Diagnosis not present

## 2022-08-30 DIAGNOSIS — G4733 Obstructive sleep apnea (adult) (pediatric): Secondary | ICD-10-CM | POA: Diagnosis not present

## 2022-08-31 ENCOUNTER — Ambulatory Visit: Payer: Medicare HMO | Admitting: Family Medicine

## 2022-08-31 NOTE — Progress Notes (Unsigned)
Name: Sarah Gordon   MRN: [id number]    DOB: 02-23-1951   Date:09/01/2022       Progress Note  Subjective  Chief Complaint  Follow Up  HPI  Memory changes: seen by Dr. Sherryll Burger, had MRI and went to eye doctor and eye pressure normal, had one B12 injection and is now taking SL B12, memory still the same She has significant history of dementia She states symptoms are stable. Symptoms are stable, has yearly follow ups with neurologist   IMPRESSION:  1. Partially empty sella which can be a normal variant but can also  be seen in the setting of intracranial hypertension. Otherwise  normal appearance of the brain.  2. Small left mastoid effusion.   HTN: taking valsartan hctz and Toprol XL and bp today is at goal    GERD: she stopped FODMAP diet, doing well now, taking omeprazole BID and needs a refill  . She saw GI and evaluated for bloating and is doing much better .   Ventricular flutter: seen by cardiologist, medication for thyroid adjusted and feeling well since and taking metoprolol . Stable on medication    MDD: she is taking Seroquel to sleep, Duloxetine for mood. She feels tired all the time. She worries about family members. Her sister that had leg amputated is not doing well emotionally and cannot tolerate prosthesis do to pain on her stump. She worries about her sister attempting suicide again - she tried about 40 years ago and lives in [place].    DMII: she was doing well on diet only, A1C went down to 6.7% to 6.5 %  Denies polyphagia, polydipsia or polyuria She has a history of neuropathy, very mild symptoms. She is on Zetia but stopped  Repatha due to cost, she is unable to tolerate statin therapy. She has a new insurance and we will try sending Repatha again She has taken Metformin in the past but could not tolerate it   Sicca: going on for over one year but getting progressively worse, she also has joint aches and we will check for Sjogren's ,she also has dry eyes    DDD lumbar  spine: she had MRI done back in 2010 and showed herniated disc, she had steroid injections twice and it helped. Intermittent symptoms that radiates down both legs, she has noticed increase of lower leg pain, has to get up at night to walk, cramping like, worse on right lower leg than left lower leg . Pain right now is 3/10 She takes Tramadol prn - still has some old rx at home. We will try Requip possilbe RLS but if no improvement she needs to go back to Ortho    OA: knees and feet, also has FMS, she states her legs are doing better, except for when radiates down her legs.She uses a walker when she walks longer distances or when back is flaring Unchanged    Asthma: She has occasional dry cough, SOB and wheezing a couple of times a month . She stopped using Flovent and doing okay at this time    Morbid Obesity: BMI above 40, her weight is trending up, DM showed increase of A1C but still not taking medication    Hypothyroidism: she is taking medication daily, denies hair loss, she has chronic dry skin and chronic constipation Last TSH was at goal Continue current dose    Patient Active Problem List   Diagnosis Date Noted   Varicose veins with inflammation 07/16/2022   Cognitive complaints  10/27/2021   Polyp of colon    Lumbar radiculopathy 11/06/2020   Ventricular flutter (HCC) 12/06/2018   MDD (major depressive disorder), recurrent episode, mild (HCC) 09/04/2018   DDD (degenerative disc disease), cervical 01/23/2016   Degenerative joint disease of left acromioclavicular joint 01/23/2016   Recurrent oral herpes simplex infection 09/01/2015   Irritable bowel syndrome with diarrhea 08/07/2015   Statin intolerance 02/28/2015   Asthma, intermittent 09/10/2014   Controlled type 2 diabetes with neuropathy (HCC) 09/10/2014   Fibromyalgia 09/10/2014   Obstructive sleep apnea 09/10/2014   Hyperlipidemia 12/29/2012   Essential hypertension 12/29/2012   Morbid obesity (HCC) 12/29/2012   Arthritis  due to gout 12/13/2007   Acid reflux 10/24/2006   Adult hypothyroidism 10/24/2006   Localized osteoarthrosis 10/24/2006    Past Surgical History:  Procedure Laterality Date   BREAST BIOPSY Left    benign   COLONOSCOPY     Dr Thurmond Butts   COLONOSCOPY WITH PROPOFOL N/A 11/19/2015   Procedure: COLONOSCOPY WITH PROPOFOL;  Surgeon: Kieth Brightly, MD;  Location: [facility] ENDOSCOPY;  Service: Endoscopy;  Laterality: N/A;   COLONOSCOPY WITH PROPOFOL N/A 06/18/2021   Procedure: COLONOSCOPY WITH PROPOFOL;  Surgeon: Toney Reil, MD;  Location[facility] END[facility]COPY;  Service: Gastroenterology;  Laterality: N/A;   ESOPHAGOGASTRODUODENOSCOPY N/A 08/20/2014   Procedure: ESOPHAGOGASTRODUODENOSCOPY (EGD);  Surgeon: Kieth Brightly, MD;  Location: [facility] ENDOSCOPY;  Service: Endoscopy;  Laterality: N/A;   ESOPHAGOGASTRODUODENOSCOPY (EGD) WITH PROPOFOL N/A 06/18/2021   Procedure: ESOPHAGOGASTRODUODENOSCOPY (EGD) WITH PROPOFOL;  Surgeon: Toney Reil, MD;  Location: [facility] ENDOSCOPY;  Service: Gastroenterology;  Laterality: N/A;   FOOT SURGERY Left    heel spur   HERNIA REPAIR     umbilical   NASAL SINUS SURGERY  1980s   UMBILICAL HERNIA REPAIR      Family History  Problem Relation Age of Onset   Hypertension Mother    Hyperlipidemia Mother    Diabetes Mother    Heart disease Mother    Stroke Mother    Varicose Veins Mother    Alcohol abuse Father    Lupus Sister    Arthritis Sister    Depression Sister    Obesity Sister    Asthma Sister    Depression Sister    Hypertension Sister    Miscarriages / Stillbirths Sister    Varicose Veins Sister    Depression Sister    Arthritis Sister    Heart disease Sister    Hyperlipidemia Sister    Hypertension Sister    Miscarriages / [place] Sister    Varicose Veins Sister    Miscarriages / Stillbirths Sister    Arthritis Sister    Depression Sister    Heart disease Sister    Hyperlipidemia Sister    Hypertension Sister    Heart  disease Brother        CABG x 3    Heart attack Brother 36   Hyperlipidemia Brother    Hypertension Brother    Diabetes Brother    Hypertension Brother    Obesity Brother    Asthma Brother    Depression Brother    Hyperlipidemia Brother    Alcohol abuse Brother    Heart disease Brother    Stroke Brother    Varicose Veins Brother    Early death Brother    Stroke Brother    Arthritis Brother    Depression Brother    Hyperlipidemia Brother    Hypertension Brother    Stroke Maternal Grandfather  Breast cancer Neg Hx     Social History   Tobacco Use   Smoking status: Never   Smokeless tobacco: Never  Substance Use Topics   Alcohol use: No    Comment: RARELY     Current Outpatient Medications:    acetaminophen (TYLENOL) 650 MG CR tablet, Take 1,300 mg by mouth 2 (two) times daily., Disp: , Rfl:    albuterol (VENTOLIN HFA) 108 (90 Base) MCG/ACT inhaler, Inhale 2 puffs into the lungs every 6 (six) hours as needed for wheezing or shortness of breath., Disp: 1 each, Rfl: 2   DULoxetine (CYMBALTA) 60 MG capsule, TAKE 1 CAPSULE BY MOUTH DAILY, Disp: 90 capsule, Rfl: 1   ezetimibe (ZETIA) 10 MG tablet, TAKE 1 TABLET BY MOUTH DAILY, Disp: 90 tablet, Rfl: 0   FLOVENT DISKUS 100 MCG/ACT AEPB, INHALE 2 PUFFS BY MOUTH TWICE A DAY, Disp: 60 each, Rfl: 1   hydrOXYzine (ATARAX) 10 MG tablet, Take 1 tablet (10 mg total) by mouth 3 (three) times daily as needed., Disp: 30 tablet, Rfl: 0   levothyroxine (SYNTHROID) 100 MCG tablet, TAKE 1 TABLET BY MOUTH DAILY, Disp: 90 tablet, Rfl: 0   meloxicam (MOBIC) 15 MG tablet, Take 15 mg by mouth daily. Takes at night, Disp: , Rfl:    metoprolol succinate (TOPROL-XL) 25 MG 24 hr tablet, TAKE 1 TABLET BY MOUTH DAILY, Disp: 90 tablet, Rfl: 0   montelukast (SINGULAIR) 10 MG tablet, TAKE 1 TABLET BY MOUTH AT BEDTIME, Disp: 90 tablet, Rfl: 1   omeprazole (PRILOSEC) 20 MG capsule, TAKE ONE CAPSULE BY MOUTH DAILY, Disp: 90 capsule, Rfl: 0   QUEtiapine  (SEROQUEL) 25 MG tablet, TAKE 1 TABLET BY MOUTH AT BEDTIME, Disp: 90 tablet, Rfl: 0   traMADol (ULTRAM) 50 MG tablet, Take 50 mg by mouth daily as needed., Disp: , Rfl:    valsartan-hydrochlorothiazide (DIOVAN-HCT) 160-25 MG tablet, Take 1 tablet by mouth daily., Disp: 90 tablet, Rfl: 1   meclizine (ANTIVERT) 25 MG tablet, Take 1 tablet (25 mg total) by mouth 3 (three) times daily as needed for dizziness. (Patient not taking: Reported on 07/15/2022), Disp: 30 tablet, Rfl: 0  Allergies  Allergen Reactions   Vascepa [Icosapent Ethyl] Hives and Swelling   Aspir-81 [Aspirin] Other (See Comments)    Coagulation disorder   Gabapentin Other (See Comments)    Cognitive/memory issues   Mounjaro [Tirzepatide] Diarrhea    Epigastric pain    Statins Other (See Comments)   Topamax [Topiramate] Other (See Comments)    Cognitive issues    Voltaren [Diclofenac Sodium] Hives and Other (See Comments)   Lipitor [Atorvastatin]     Joint pain    I personally reviewed active problem list, medication list, allergies, family history, social history, health maintenance with the patient/caregiver today.   ROS  Ten systems reviewed and is negative except as mentioned in HPI   Objective  Vitals:   09/01/22 0825  BP: 130/76  Pulse: 90  Resp: 16  SpO2: 96%  Weight: 276 lb (125.2 kg)  Height: 5\' 6"  (1.676 m)    Body mass index is 44.55 kg/m.  Physical Exam  Constitutional: Patient appears well-developed and well-nourished. Obese  No distress.  HEENT: head atraumatic, normocephalic, pupils equal and reactive to light, neck supple Cardiovascular: Normal rate, regular rhythm and normal heart sounds.  No murmur heard. No BLE edema. Pulmonary/Chest: Effort normal and breath sounds normal. No respiratory distress. Abdominal: Soft.  There is no tenderness. Psychiatric: Patient has a normal mood  and affect. behavior is normal. Judgment and thought content normal.    PHQ2/9:    09/01/2022    8:25 AM  07/19/2022    8:36 AM 06/21/2022    9:08 AM 06/15/2022    9:42 AM 06/04/2022   11:03 AM  Depression screen PHQ 2/9  Decreased Interest 1 1 0 0 1  Down, Depressed, Hopeless 1 1 0 0 1  PHQ - 2 Score 2 2 0 0 2  Altered sleeping 1 1 1  0 0  Tired, decreased energy 1 1 0 0 0  Change in appetite 0 0 0 0 0  Feeling bad or failure about yourself  0 0 0 0 0  Trouble concentrating 0 0 0 0 0  Moving slowly or fidgety/restless 0 0 0 0 0  Suicidal thoughts 0 0 0 0 0  PHQ-9 Score 4 4 1  0 2  Difficult doing work/chores  Somewhat difficult  Not difficult at all     phq 9 is positive   Fall Risk:    09/01/2022    8:25 AM 07/19/2022    8:36 AM 06/21/2022    9:08 AM 06/15/2022    9:42 AM 06/04/2022   10:57 AM  Fall Risk   Falls in the past year? 1 1 1 1 1   Number falls in past yr: 0 1 0 0 0  Injury with Fall? 1 1 0 0 0  Risk for fall due to : Impaired mobility  No Fall Risks Impaired balance/gait No Fall Risks  Follow up Falls prevention discussed  Falls prevention discussed Falls prevention discussed;Education provided;Falls evaluation completed Education provided;Falls prevention discussed      Functional Status Survey: Is the patient deaf or have difficulty hearing?: No Does the patient have difficulty seeing, even when wearing glasses/contacts?: No Does the patient have difficulty concentrating, remembering, or making decisions?: Yes Does the patient have difficulty walking or climbing stairs?: Yes Does the patient have difficulty dressing or bathing?: No Does the patient have difficulty doing errands alone such as visiting a doctor's office or shopping?: No    Assessment & Plan  1. Controlled type 2 diabetes with neuropathy (HCC)  She took gabapentin but it caused memory changes   2. Dyslipidemia associated with type 2 diabetes mellitus (HCC)  - ezetimibe (ZETIA) 10 MG tablet; Take 1 tablet (10 mg total) by mouth daily.  Dispense: 90 tablet; Refill: 1 - Repatha   3. Ventricular  flutter (HCC)  Rate controlled with Beta blocker  4. MDD (recurrent major depressive disorder) in remission (HCC)  Under more stress, discussed therapy  5. Morbid obesity (HCC)  Discussed with the patient the risk posed by an increased BMI. Discussed importance of portion control, calorie counting and at least 150 minutes of physical activity weekly. Avoid sweet beverages and drink more water. Eat at least 6 servings of fruit and vegetables daily    6. Adult hypothyroidism  - levothyroxine (SYNTHROID) 100 MCG tablet; TAKE 1 TABLET BY MOUTH DAILY  Dispense: 90 tablet; Refill: 0  7. B12 deficiency  Cut down to a few times a week   8. Gastroesophageal reflux disease without esophagitis  Taking PPI BID  9. OSA (obstructive sleep apnea)  She is trying to go back for titration test, she is speaking to neurologist about it  10. Essential hypertension  - metoprolol succinate (TOPROL-XL) 25 MG 24 hr tablet; Take 1 tablet (25 mg total) by mouth daily.  Dispense: 90 tablet; Refill: 0  11. Nocturnal muscle  cramps  We will try Requip   12. Sicca, unspecified type (HCC)  - Sjogrens syndrome-A extractable nuclear antibody - Sjogrens syndrome-B extractable nuclear antibody

## 2022-09-01 ENCOUNTER — Ambulatory Visit (INDEPENDENT_AMBULATORY_CARE_PROVIDER_SITE_OTHER): Payer: Medicare HMO | Admitting: Family Medicine

## 2022-09-01 ENCOUNTER — Encounter: Payer: Self-pay | Admitting: Family Medicine

## 2022-09-01 VITALS — BP 130/76 | HR 90 | Resp 16 | Ht 66.0 in | Wt 276.0 lb

## 2022-09-01 DIAGNOSIS — E785 Hyperlipidemia, unspecified: Secondary | ICD-10-CM

## 2022-09-01 DIAGNOSIS — I10 Essential (primary) hypertension: Secondary | ICD-10-CM

## 2022-09-01 DIAGNOSIS — E1169 Type 2 diabetes mellitus with other specified complication: Secondary | ICD-10-CM | POA: Diagnosis not present

## 2022-09-01 DIAGNOSIS — K219 Gastro-esophageal reflux disease without esophagitis: Secondary | ICD-10-CM | POA: Diagnosis not present

## 2022-09-01 DIAGNOSIS — R252 Cramp and spasm: Secondary | ICD-10-CM

## 2022-09-01 DIAGNOSIS — E039 Hypothyroidism, unspecified: Secondary | ICD-10-CM

## 2022-09-01 DIAGNOSIS — I4902 Ventricular flutter: Secondary | ICD-10-CM | POA: Diagnosis not present

## 2022-09-01 DIAGNOSIS — G4733 Obstructive sleep apnea (adult) (pediatric): Secondary | ICD-10-CM | POA: Diagnosis not present

## 2022-09-01 DIAGNOSIS — M3500 Sicca syndrome, unspecified: Secondary | ICD-10-CM | POA: Diagnosis not present

## 2022-09-01 DIAGNOSIS — E1140 Type 2 diabetes mellitus with diabetic neuropathy, unspecified: Secondary | ICD-10-CM

## 2022-09-01 DIAGNOSIS — F3340 Major depressive disorder, recurrent, in remission, unspecified: Secondary | ICD-10-CM

## 2022-09-01 MED ORDER — EZETIMIBE 10 MG PO TABS: 10.0000 mg | ORAL_TABLET | Freq: Every day | ORAL | 1 refills | Status: DC

## 2022-09-01 MED ORDER — OMEPRAZOLE 20 MG PO CPDR: 20.0000 mg | DELAYED_RELEASE_CAPSULE | Freq: Two times a day (BID) | ORAL | 0 refills | Status: DC

## 2022-09-01 MED ORDER — LEVOTHYROXINE SODIUM 100 MCG PO TABS: ORAL_TABLET | ORAL | 0 refills | Status: DC

## 2022-09-01 MED ORDER — REPATHA 140 MG/ML ~~LOC~~ SOSY: 140.0000 mg | PREFILLED_SYRINGE | SUBCUTANEOUS | 2 refills | Status: DC

## 2022-09-01 MED ORDER — ROPINIROLE HCL 0.5 MG PO TABS: 0.5000 mg | ORAL_TABLET | Freq: Every day | ORAL | 0 refills | Status: DC

## 2022-09-01 MED ORDER — OLMESARTAN MEDOXOMIL-HCTZ 40-12.5 MG PO TABS: 1.0000 | ORAL_TABLET | Freq: Every day | ORAL | 0 refills | Status: DC

## 2022-09-01 MED ORDER — METOPROLOL SUCCINATE ER 25 MG PO TB24: 25.0000 mg | ORAL_TABLET | Freq: Every day | ORAL | 0 refills | Status: DC

## 2022-09-01 NOTE — Patient Instructions (Signed)
Stop valsartan hctz 160/25 and start Olmasartan hctz 40/12.5 to see if leg cramps will decrease We are also sending Repatha for cholesterol and Requip for possible RLS

## 2022-09-02 ENCOUNTER — Other Ambulatory Visit: Payer: Self-pay | Admitting: Gastroenterology

## 2022-09-02 LAB — SJOGRENS SYNDROME-B EXTRACTABLE NUCLEAR ANTIBODY: SSB (La) (ENA) Antibody, IgG: <1 AI

## 2022-09-02 LAB — SJOGRENS SYNDROME-A EXTRACTABLE NUCLEAR ANTIBODY: SSA (Ro) (ENA) Antibody, IgG: <1 AI

## 2022-09-07 ENCOUNTER — Encounter (INDEPENDENT_AMBULATORY_CARE_PROVIDER_SITE_OTHER): Payer: Self-pay | Admitting: Vascular Surgery

## 2022-09-08 ENCOUNTER — Emergency Department: Payer: Medicare HMO

## 2022-09-08 ENCOUNTER — Emergency Department
Admission: EM | Admit: 2022-09-08 | Discharge: 2022-09-08 | Disposition: A | Discharge status: Home / Self Care | Payer: Medicare HMO | Attending: Emergency Medicine | Admitting: Emergency Medicine

## 2022-09-08 ENCOUNTER — Other Ambulatory Visit: Payer: Self-pay

## 2022-09-08 DIAGNOSIS — M25551 Pain in right hip: Secondary | ICD-10-CM | POA: Insufficient documentation

## 2022-09-08 DIAGNOSIS — E119 Type 2 diabetes mellitus without complications: Secondary | ICD-10-CM | POA: Diagnosis not present

## 2022-09-08 DIAGNOSIS — M79661 Pain in right lower leg: Secondary | ICD-10-CM | POA: Diagnosis not present

## 2022-09-08 MED ORDER — MELOXICAM 15 MG PO TABS: 15.0000 mg | ORAL_TABLET | Freq: Every day | ORAL | 0 refills | Status: AC

## 2022-09-08 MED ORDER — ACETAMINOPHEN 325 MG PO TABS
650.0000 mg | ORAL_TABLET | Freq: Once | ORAL | Status: AC
  Administered 2022-09-08: 650 mg via ORAL
  Filled 2022-09-08: qty 2

## 2022-09-08 NOTE — Discharge Instructions (Signed)
Take the meloxicam for 2 weeks and follow up with orthopedics if you are still having pain.

## 2022-09-08 NOTE — ED Triage Notes (Signed)
Patient presents with RIGHT hip pain after running errands yesterdays; She states that the pain has lessened but has traveled down to her RIGHT leg and follows the path of a varicose vein that runs laterally across her RIGHT thigh; Does have sciatica but states this pain does not feel similar; Denies numbness/tingling to distal extremity

## 2022-09-08 NOTE — ED Provider Notes (Signed)
[facility] Provider Note    Event Date/Time   First MD Initiated Contact with Patient 09/08/22 1328     (approximate)   History   Hip Pain  HPI  Sarah Gordon is a 72 y.o. female with PMH of T2DM with neuropathy, dyslipidemia, MDD, restless leg syndrome along with other chronic conditions who presents for evaluation of right-sided hip pain.  Patient states that her pain began yesterday.  She describes the pain as a stabbing at the hip joint that then radiates down the anterior side of her thigh from lateral to medial down to the knee.  The pain is intermittent.  She denies any trauma or falls.  She states that her leg does not feel weak but that at points the pain is so severe she feels she could fall.     Physical Exam   Triage Vital Signs: ED Triage Vitals  Enc Vitals Group     BP 09/08/22 1233 124/86     Pulse Rate 09/08/22 1233 95     Resp 09/08/22 1233 19     Temp 09/08/22 1233 98.3 F (36.8 C)     Temp Source 09/08/22 1233 Oral     SpO2 09/08/22 1233 95 %     Weight --      Height --      Head Circumference --      Peak Flow --      Pain Score 09/08/22 1234 7     Pain Loc --      Pain Edu? --      Excl. in GC? --     Most recent vital signs: Vitals:   09/08/22 1233  BP: 124/86  Pulse: 95  Resp: 19  Temp: 98.3 F (36.8 C)  SpO2: 95%    General: Awake, no distress.  CV:  Good peripheral perfusion. RRR. Resp:  Normal effort. CTAB. Abd:  No distention.  LLE:  TTP over greater trochanter as well as along the anterior thigh.  ROM elicits pain, 5/5 strength.  Sensation intact across all dermatomes.  Dorsalis pedis pulse 2+ and regular.   ED Results / Procedures / Treatments   Labs (all labs ordered are listed, but only abnormal results are displayed) Labs Reviewed - No data to display  RADIOLOGY  Left hip x-ray and DVT study obtained in the ED today.  I interpreted the images as well as reviewed the radiologist  report.    PROCEDURES:  Critical Care performed: No  Procedures   MEDICATIONS ORDERED IN ED: Medications  acetaminophen (TYLENOL) tablet 650 mg (650 mg Oral Given 09/08/22 1510)     IMPRESSION / MDM / ASSESSMENT AND PLAN / ED COURSE  I reviewed the triage vital signs and the nursing notes.                              Differential diagnosis includes, but is not limited to, sciatica, greater trochanteric pain syndrome, DVT, muscle strain, neuropathy.  Patient's presentation is most consistent with acute complicated illness / injury requiring diagnostic workup.  Patient presented for evaluation of left-sided hip pain.  Hip x-rays ordered from triage.  I interpreted the images as well as reviewed the radiologist report.  No evidence of fracture or other osseous abnormality.  Patient did report history of varicose veins so DVT study was obtained.  There was no evidence of DVT but it was positive for age-indeterminate occlusive  superficial thrombophlebitis.  Given patient's negative DVT I believe her pain is musculoskeletal in origin.  I prescribed meloxicam and instructed patient to take it for 2 weeks and follow-up with orthopedics if she is still having pain.  Patient voiced understanding, all questions were answered and she was stable at discharge.     FINAL CLINICAL IMPRESSION(S) / ED DIAGNOSES   Final diagnoses:  Right hip pain     Rx / DC Orders   ED Discharge Orders          Ordered    meloxicam (MOBIC) 15 MG tablet  Daily        09/08/22 1608             Note:  This document was prepared using Dragon voice recognition software and may include unintentional dictation errors.   Cameron Ali, PA-C 09/08/22 1625    Chesley Noon, MD 09/08/22 [phone]

## 2022-09-15 ENCOUNTER — Telehealth: Payer: Self-pay

## 2022-09-15 NOTE — Telephone Encounter (Signed)
Transition Care Management Unsuccessful Follow-up Telephone Call  Date of discharge and from where: [place] 6/19 Attempts:  1st Attempt  Reason for unsuccessful TCM follow-up call:  Left voice message   [facility] Guide, [facility] [phone] [address] [place], [place], [place] [address] Phone: [phone] Email: [email or web address]

## 2022-09-16 ENCOUNTER — Telehealth: Payer: Self-pay

## 2022-09-16 NOTE — Telephone Encounter (Signed)
Transition Care Management Unsuccessful Follow-up Telephone Call  Date of discharge and from where:   6/19  Attempts:  2nd Attempt  Reason for unsuccessful TCM follow-up call:  Left voice message   [facility] Guide, [place] [phone] [address], [place], [place] [address] Phone: [phone] Email: [email or web address]       

## 2022-10-07 ENCOUNTER — Encounter: Payer: Self-pay | Admitting: Family Medicine

## 2022-10-07 NOTE — Progress Notes (Signed)
   Acute Office Visit  Subjective:     Patient ID: Sarah Gordon, female    DOB: [birth year], 72 y.o.   MRN: [id number]  Chief Complaint  Patient presents with   leg cramps    HPI Patient is in today for leg cramps. Was very active yesterday, walking around more than usual, did try to drink water throughout the day. Had severe right leg cramps like a charley horse that took several minutes to resolve. Drank some pickle juice which helped but then later while she was laying down she had the same thing on the left, very hard to massage the muscles because they were so tight. Has restless leg, takes Requip at night. Has had cramps before but not this severe. Does not take a multivitamin.    Review of Systems  Constitutional:  Negative for chills and fever.  Musculoskeletal:  Positive for myalgias.  Neurological:  Positive for sensory change. Negative for weakness.        Objective:    BP 128/88   Pulse 84   Temp 97.6 F (36.4 C)   Resp 18   Ht 5\' 6"  (1.676 m)   Wt 275 lb 6.4 oz (124.9 kg)   SpO2 96%   BMI 44.45 kg/m  BP Readings from Last 3 Encounters:  10/08/22 128/88  09/08/22 124/86  09/01/22 130/76   Wt Readings from Last 3 Encounters:  10/08/22 275 lb 6.4 oz (124.9 kg)  09/01/22 276 lb (125.2 kg)  07/19/22 271 lb 4.8 oz (123.1 kg)      Physical Exam Constitutional:      Appearance: Normal appearance.  HENT:     Head: Normocephalic and atraumatic.  Eyes:     Conjunctiva/sclera: Conjunctivae normal.  Cardiovascular:     Rate and Rhythm: Normal rate and regular rhythm.  Pulmonary:     Effort: Pulmonary effort is normal.     Breath sounds: Normal breath sounds.  Musculoskeletal:        General: No tenderness.     Right lower leg: No edema.     Left lower leg: No edema.  Skin:    General: Skin is warm and dry.  Neurological:     General: No focal deficit present.     Mental Status: She is alert. Mental status is at baseline.  Psychiatric:        Mood  and Affect: Mood normal.        Behavior: Behavior normal.     No results found for any visits on 10/08/22.      Assessment & Plan:   1. Bilateral leg cramps/Muscle spasms of both lower extremities: Check electrolytes, vitamin B12 again because she stopped her supplement a few months ago, was normal in May. Recommend increasing hydration, massage, topicals like BenGay, IcyHot, stretching and will prescribe muscle relaxer to take as needed. Will also start a daily multivitamin. Most likely due to overuse, will rest the next few days.  - Basic Metabolic Panel (BMET) - tiZANidine (ZANAFLEX) 4 MG tablet; Take 1 tablet (4 mg total) by mouth at bedtime as needed for muscle spasms.  Dispense: 30 tablet; Refill: 0 - B12 and Folate Panel  Return if symptoms worsen or fail to improve.  Margarita Mail, DO

## 2022-10-08 ENCOUNTER — Encounter: Payer: Self-pay | Admitting: Internal Medicine

## 2022-10-08 ENCOUNTER — Ambulatory Visit (INDEPENDENT_AMBULATORY_CARE_PROVIDER_SITE_OTHER): Payer: Medicare HMO | Admitting: Internal Medicine

## 2022-10-08 VITALS — BP 128/88 | HR 84 | Temp 97.6°F | Resp 18 | Ht 66.0 in | Wt 275.4 lb

## 2022-10-08 DIAGNOSIS — M62838 Other muscle spasm: Secondary | ICD-10-CM

## 2022-10-08 DIAGNOSIS — R252 Cramp and spasm: Secondary | ICD-10-CM | POA: Diagnosis not present

## 2022-10-08 MED ORDER — TIZANIDINE HCL 4 MG PO TABS
4.0000 mg | ORAL_TABLET | Freq: Every evening | ORAL | 0 refills | Status: DC | PRN
Start: 2022-10-08 — End: 2022-11-11

## 2022-10-09 LAB — BASIC METABOLIC PANEL
BUN/Creatinine Ratio: 28 (calc) — ABNORMAL HIGH (ref 6–22)
BUN: 30 mg/dL — ABNORMAL HIGH (ref 7–25)
CO2: 25 mmol/L (ref 20–32)
Calcium: 9 mg/dL (ref 8.6–10.4)
Chloride: 105 mmol/L (ref 98–110)
Creat: 1.09 mg/dL — ABNORMAL HIGH (ref 0.50–1.05)
Glucose, Bld: 136 mg/dL — ABNORMAL HIGH (ref 65–99)
Potassium: 4.9 mmol/L (ref 3.5–5.3)
Sodium: 138 mmol/L (ref 135–146)

## 2022-10-09 LAB — B12 AND FOLATE PANEL
Folate: 5.3 ng/mL — ABNORMAL LOW
Vitamin B-12: 579 pg/mL (ref 200–1100)

## 2022-10-18 ENCOUNTER — Ambulatory Visit (INDEPENDENT_AMBULATORY_CARE_PROVIDER_SITE_OTHER): Payer: Medicare HMO | Admitting: Vascular Surgery

## 2022-10-18 ENCOUNTER — Encounter (INDEPENDENT_AMBULATORY_CARE_PROVIDER_SITE_OTHER): Payer: Self-pay | Admitting: Vascular Surgery

## 2022-10-18 VITALS — BP 131/88 | HR 83 | Resp 16 | Wt 271.8 lb

## 2022-10-18 DIAGNOSIS — J4520 Mild intermittent asthma, uncomplicated: Secondary | ICD-10-CM

## 2022-10-18 DIAGNOSIS — I10 Essential (primary) hypertension: Secondary | ICD-10-CM

## 2022-10-18 DIAGNOSIS — K219 Gastro-esophageal reflux disease without esophagitis: Secondary | ICD-10-CM | POA: Diagnosis not present

## 2022-10-18 DIAGNOSIS — E1140 Type 2 diabetes mellitus with diabetic neuropathy, unspecified: Secondary | ICD-10-CM | POA: Diagnosis not present

## 2022-10-18 DIAGNOSIS — I8310 Varicose veins of unspecified lower extremity with inflammation: Secondary | ICD-10-CM | POA: Diagnosis not present

## 2022-10-20 ENCOUNTER — Encounter (INDEPENDENT_AMBULATORY_CARE_PROVIDER_SITE_OTHER): Payer: Self-pay | Admitting: Vascular Surgery

## 2022-10-20 NOTE — Progress Notes (Signed)
MRN : [id number]  Sarah Gordon is a 72 y.o. (May 17, 1950) female who presents with chief complaint of varicose veins hurt.  History of Present Illness:   The patient returns for followup evaluation 3 months after the initial visit. The patient continues to have pain in the lower extremities with dependency. The pain is lessened with elevation. Graduated compression stockings, Class I (20-30 mmHg), have been worn but the stockings do not eliminate the leg pain. Over-the-counter analgesics do not improve the symptoms. The degree of discomfort continues to interfere with daily activities. The patient notes the pain in the legs is causing problems with daily exercise, at the workplace and even with household activities and maintenance such as standing in the kitchen preparing meals and doing dishes.   Venous ultrasound shows normal deep venous system, no evidence of acute or chronic DVT.  Superficial reflux is present in the bilateral great saphenous veins.  The right great saphenous vein appears to be much worse than the left  Current Meds  Medication Sig   acetaminophen (TYLENOL) 650 MG CR tablet Take 1,300 mg by mouth 2 (two) times daily.   albuterol (VENTOLIN HFA) 108 (90 Base) MCG/ACT inhaler Inhale 2 puffs into the lungs every 6 (six) hours as needed for wheezing or shortness of breath.   DULoxetine (CYMBALTA) 60 MG capsule TAKE 1 CAPSULE BY MOUTH DAILY   Evolocumab (REPATHA) 140 MG/ML SOSY Inject 140 mg into the skin every 14 (fourteen) days.   ezetimibe (ZETIA) 10 MG tablet Take 1 tablet (10 mg total) by mouth daily.   hydrOXYzine (ATARAX) 10 MG tablet Take 1 tablet (10 mg total) by mouth 3 (three) times daily as needed.   levothyroxine (SYNTHROID) 100 MCG tablet TAKE 1 TABLET BY MOUTH DAILY   metoprolol succinate (TOPROL-XL) 25 MG 24 hr tablet Take 1 tablet (25 mg total) by mouth daily.   montelukast (SINGULAIR) 10 MG tablet TAKE 1 TABLET BY MOUTH AT BEDTIME    olmesartan-hydrochlorothiazide (BENICAR HCT) 40-12.5 MG tablet Take 1 tablet by mouth daily.   omeprazole (PRILOSEC) 20 MG capsule Take 1 capsule (20 mg total) by mouth 2 (two) times daily before a meal.   QUEtiapine (SEROQUEL) 25 MG tablet TAKE 1 TABLET BY MOUTH AT BEDTIME   rOPINIRole (REQUIP) 0.5 MG tablet Take 1 tablet (0.5 mg total) by mouth at bedtime.   tiZANidine (ZANAFLEX) 4 MG tablet Take 1 tablet (4 mg total) by mouth at bedtime as needed for muscle spasms.   traMADol (ULTRAM) 50 MG tablet Take 50 mg by mouth daily as needed.    Past Medical History:  Diagnosis Date   Allergy    Anxiety '95   Asthma    Cataract    Depression '95   Esophageal reflux    Gouty arthropathy, unspecified    Herpes simplex without mention of complication    Irritable bowel syndrome    Localized osteoarthrosis not specified whether primary or secondary, unspecified site    Migraine, unspecified, without mention of intractable migraine without mention of status migrainosus    Mixed hyperlipidemia    Myalgia and myositis, unspecified    Obstructive sleep apnea (adult) (pediatric)    Palpitations    Sleep apnea    Type II or unspecified type diabetes mellitus without mention of complication, not stated as uncontrolled    Umbilical hernia without mention of obstruction or gangrene    Unspecified essential hypertension  Unspecified hypothyroidism     Past Surgical History:  Procedure Laterality Date   BREAST BIOPSY Left    benign   COLONOSCOPY     Dr Thurmond Butts   COLONOSCOPY WITH PROPOFOL N/A 11/19/2015   Procedure: COLONOSCOPY WITH PROPOFOL;  Surgeon: Kieth Brightly, MD;  Location: [facility] ENDOSCOPY;  Service: Endoscopy;  Laterality: N/A;   COLONOSCOPY WITH PROPOFOL N/A 06/18/2021   Procedure: COLONOSCOPY WITH PROPOFOL;  Surgeon: Toney Reil, MD;  Location: [facility] ENDOSCOPY;  Service: Gastroenterology;  Laterality: N/A;   ESOPHAGOGASTRODUODENOSCOPY N/A 08/20/2014   Procedure:  ESOPHAGOGASTRODUODENOSCOPY (EGD);  Surgeon: Kieth Brightly, MD;  Location: [facility] ENDOSCOPY;  Service: Endoscopy;  Laterality: N/A;   ESOPHAGOGASTRODUODENOSCOPY (EGD) WITH PROPOFOL N/A 06/18/2021   Procedure: ESOPHAGOGASTRODUODENOSCOPY (EGD) WITH PROPOFOL;  Surgeon: Toney Reil, MD;  Location: [facility] ENDOSCOPY;  Service: Gastroenterology;  Laterality: N/A;   FOOT SURGERY Left    heel spur   HERNIA REPAIR     umbilical   NASAL SINUS SURGERY  1980s   UMBILICAL HERNIA REPAIR      Social History Social History   Tobacco Use   Smoking status: Never   Smokeless tobacco: Never  Vaping Use   Vaping status: Never Used  Substance Use Topics   Alcohol use: No    Comment: RARELY   Drug use: No    Family History Family History  Problem Relation Age of Onset   Hypertension Mother    Hyperlipidemia Mother    Diabetes Mother    Heart disease Mother    Stroke Mother    Varicose Veins Mother    Alcohol abuse Father    Lupus Sister    Arthritis Sister    Depression Sister    Obesity Sister    Asthma Sister    Depression Sister    Hypertension Sister    Miscarriages / Stillbirths Sister    Varicose Veins Sister    Depression Sister    Arthritis Sister    Heart disease Sister    Hyperlipidemia Sister    Hypertension Sister    Miscarriages / [place] Sister    Varicose Veins Sister    Miscarriages / Stillbirths Sister    Arthritis Sister    Depression Sister    Heart disease Sister    Hyperlipidemia Sister    Hypertension Sister    Heart disease Brother 11       CABG x 3    Heart attack Brother 29   Hyperlipidemia Brother    Hypertension Brother    Diabetes Brother    Hypertension Brother    Obesity Brother    Asthma Brother    Depression Brother    Hyperlipidemia Brother    Alcohol abuse Brother    Heart disease Brother    Stroke Brother    Varicose Veins Brother    Early death Brother    Stroke Brother    Arthritis Brother    Depression Brother     Hyperlipidemia Brother    Hypertension Brother    Stroke Maternal Grandfather    Breast cancer Neg Hx     Allergies  Allergen Reactions   Vascepa [Icosapent Ethyl] Hives and Swelling   Aspir-81 [Aspirin] Other (See Comments)    Coagulation disorder   Gabapentin Other (See Comments)    Cognitive/memory issues   Metformin And Related     indigestion   Mounjaro [Tirzepatide] Diarrhea    Epigastric pain    Statins Other (See Comments)   Topamax [Topiramate] Other (  See Comments)    Cognitive issues    Voltaren [Diclofenac Sodium] Hives and Other (See Comments)   Lipitor [Atorvastatin]     Joint pain     REVIEW OF SYSTEMS (Negative unless checked)  Constitutional: [] Weight loss  [] Fever  [] Chills Cardiac: [] Chest pain   [] Chest pressure   [] Palpitations   [] Shortness of breath when laying flat   [] Shortness of breath with exertion. Vascular:  [] Pain in legs with walking   [x] Pain in legs with standing  [] History of DVT   [] Phlebitis   [x] Swelling in legs   [x] Varicose veins   [] Non-healing ulcers Pulmonary:   [] Uses home oxygen   [] Productive cough   [] Hemoptysis   [] Wheeze  [] COPD   [] Asthma Neurologic:  [] Dizziness   [] Seizures   [] History of stroke   [] History of TIA  [] Aphasia   [] Vissual changes   [] Weakness or numbness in arm   [] Weakness or numbness in leg Musculoskeletal:   [] Joint swelling   [] Joint pain   [] Low back pain Hematologic:  [] Easy bruising  [] Easy bleeding   [] Hypercoagulable state   [] Anemic Gastrointestinal:  [] Diarrhea   [] Vomiting  [x] Gastroesophageal reflux/heartburn   [] Difficulty swallowing. Genitourinary:  [] Chronic kidney disease   [] Difficult urination  [] Frequent urination   [] Blood in urine Skin:  [] Rashes   [] Ulcers  Psychological:  [] History of anxiety   []  History of major depression.  Physical Examination  Vitals:   10/18/22 1128  BP: 131/88  Pulse: 83  Resp: 16  Weight: 271 lb 12.8 oz (123.3 kg)   Body mass index is 43.87 kg/m. Gen:  WD/WN, NAD Head: /AT, No temporalis wasting.  Ear/Nose/Throat: Hearing grossly intact, nares w/o erythema or drainage, pinna without lesions Eyes: PER, EOMI, sclera nonicteric.  Neck: Supple, no gross masses.  No JVD.  Pulmonary:  Good air movement, no audible wheezing, no use of accessory muscles.  Cardiac: RRR, precordium not hyperdynamic. Vascular:  Large varicosities present, greater than 10 mm bilaterally, right is much more affected than the left.  Veins are tender to palpation  Mild venous stasis changes to the legs bilaterally.  Trace soft pitting edema CEAP C3sEpAsPr Vessel Right Left  Radial Palpable Palpable  Gastrointestinal: soft, non-distended. No guarding/no peritoneal signs.  Musculoskeletal: M/S 5/5 throughout.  No deformity.  Neurologic: CN 2-12 intact. Pain and light touch intact in extremities.  Symmetrical.  Speech is fluent. Motor exam as listed above. Psychiatric: Judgment intact, Mood & affect appropriate for pt's clinical situation. Dermatologic: Venous rashes no ulcers noted.  No changes consistent with cellulitis. Lymph : No lichenification or skin changes of chronic lymphedema.  CBC Lab Results  Component Value Date   WBC 4.2 06/15/2022   HGB 11.8 06/15/2022   HCT 35.5 06/15/2022   MCV 94.7 06/15/2022   PLT 232 06/15/2022    BMET    Component Value Date/Time   NA 138 10/08/2022 0847   NA 144 09/11/2014 0948   K 4.9 10/08/2022 0847   CL 105 10/08/2022 0847   CO2 25 10/08/2022 0847   GLUCOSE 136 (H) 10/08/2022 0847   BUN 30 (H) 10/08/2022 0847   BUN 21 09/11/2014 0948   CREATININE 1.09 (H) 10/08/2022 0847   CALCIUM 9.0 10/08/2022 0847   GFRNONAA 57 (L) 02/20/2022 1258   GFRNONAA 70 05/23/2020 1020   GFRAA 81 05/23/2020 1020   Estimated Creatinine Clearance: 65.3 mL/min (A) (by C-G formula based on SCr of 1.09 mg/dL (H)).  COAG No results found for: "INR", "PROTIME"  Radiology No results found.   Assessment/Plan 1. Varicose veins with  inflammation Recommend  I have reviewed my previous  discussion with the patient regarding  varicose veins and why they cause symptoms. Patient will continue  wearing graduated compression stockings class 1 on a daily basis, beginning first thing in the morning and removing them in the evening.  The patient is CEAP C3sEpAsPr.  The patient has been wearing compression for more than 12 weeks with no or little benefit.  The patient has been exercising daily for more than 12 weeks. The patient has been elevating and taking OTC pain medications for more than 12 weeks.  None of these have have eliminated the pain related to the varicose veins and venous reflux or the discomfort regarding venous congestion.    In addition, behavioral modification including elevation during the day was again discussed and this will continue.  The patient has utilized over the counter pain medications and has been exercising.  However, at this time conservative therapy has not alleviated the patient's symptoms of leg pain and swelling  Recommend: laser ablation of the right and  left great saphenous veins to eliminate the symptoms of pain and swelling of the lower extremities caused by the severe superficial venous reflux disease.  The right leg is more severely affected and therefore the right laser ablation will be performed first and then the left laser ablation will be done.  2. Essential hypertension Continue antihypertensive medications as already ordered, these medications have been reviewed and there are no changes at this time.  3. Mild intermittent asthma without complication Continue pulmonary medications and aerosols as already ordered, these medications have been reviewed and there are no changes at this time.   4. Gastroesophageal reflux disease, unspecified whether esophagitis present Continue PPI as already ordered, this medication has been reviewed and there are no changes at this time.  Avoidence of  caffeine and alcohol  Moderate elevation of the head of the bed   5. Controlled type 2 diabetes with neuropathy (HCC) Continue hypoglycemic medications as already ordered, these medications have been reviewed and there are no changes at this time.  Hgb A1C to be monitored as already arranged by primary servic    Levora Dredge, MD  10/20/2022 8:29 AM

## 2022-10-26 IMAGING — MG MM DIGITAL SCREENING BILAT W/ TOMO AND CAD
6 of 10 series · 6 of 30 positions shown · non-contrast
Comparison: Previous exam(s).

CLINICAL DATA: Screening.

EXAM:
DIGITAL SCREENING BILATERAL MAMMOGRAM WITH TOMOSYNTHESIS AND CAD
TECHNIQUE: Bilateral screening digital craniocaudal and mediolateral oblique
mammograms were obtained. Bilateral screening digital breast
tomosynthesis was performed. The images were evaluated with
computer-aided detection.

[L CC synth-2D (1 of 2)]
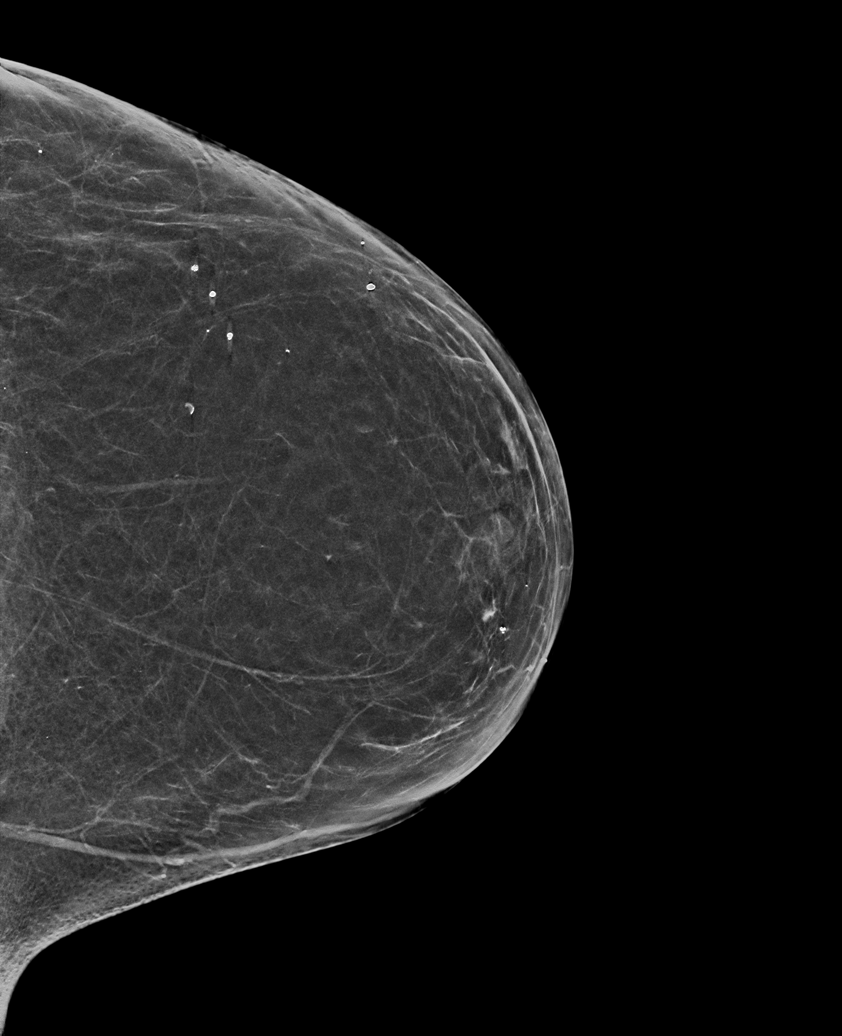

[R MLO synth-2D]
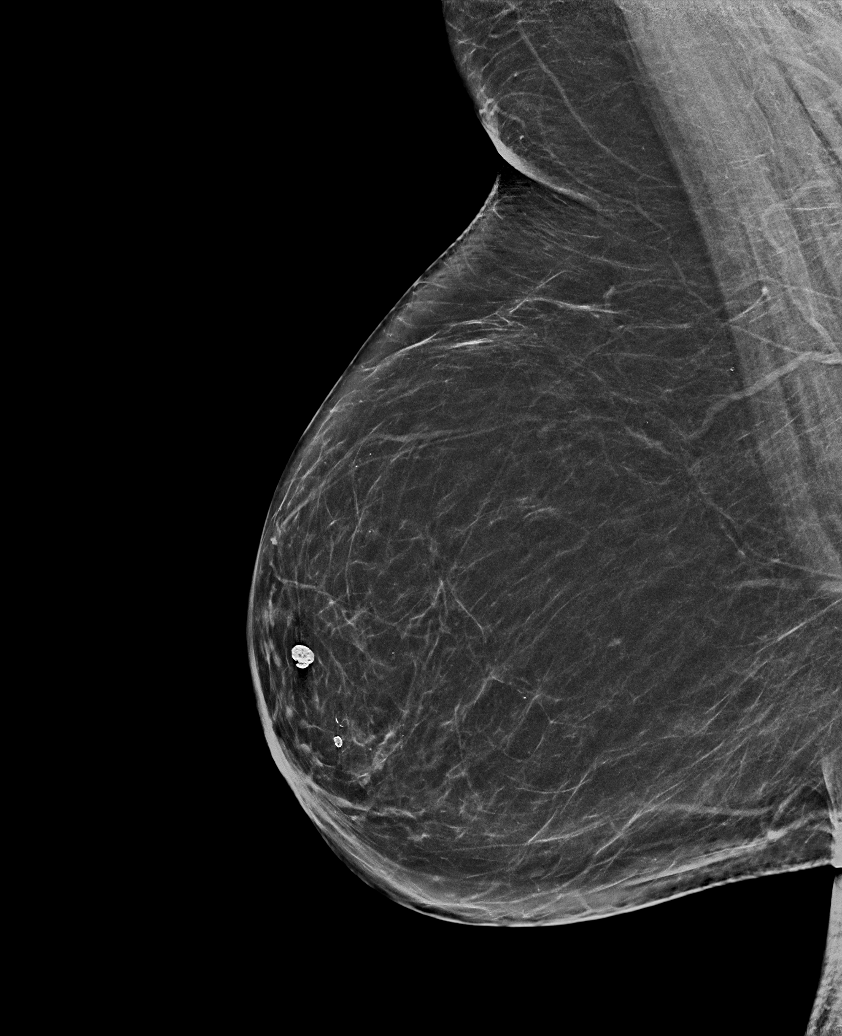

[L CC synth-2D (2 of 2)]
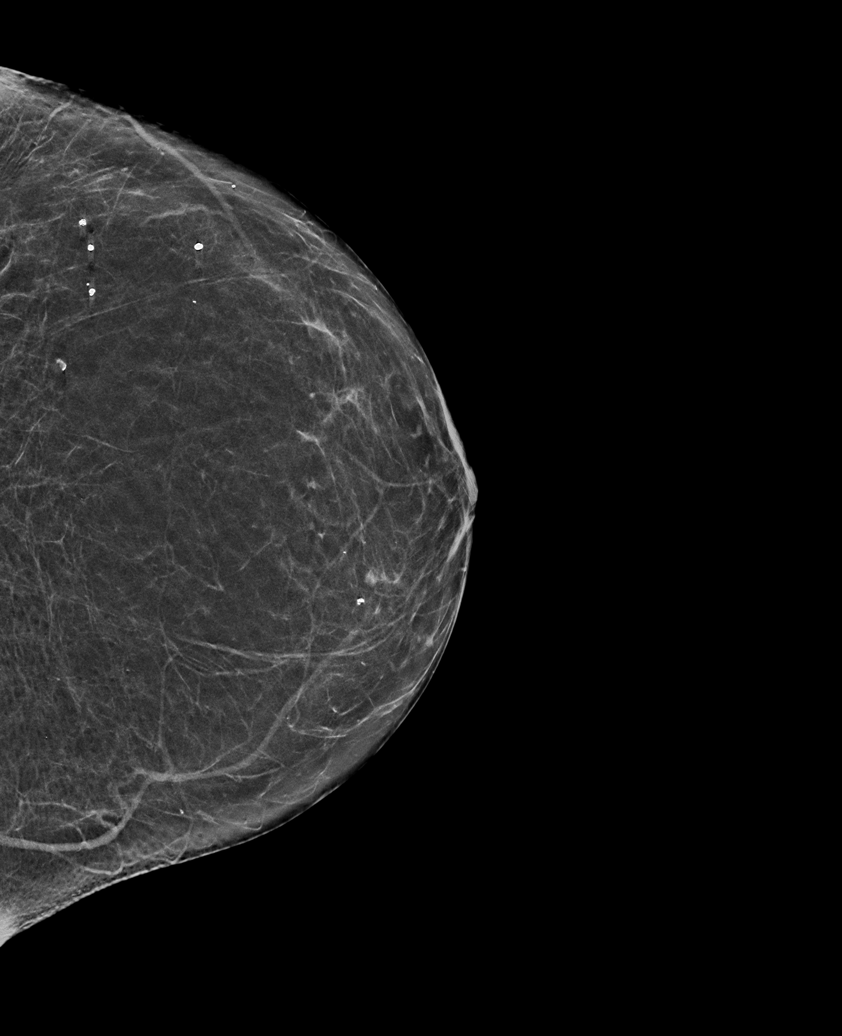

[L MLO synth-2D]
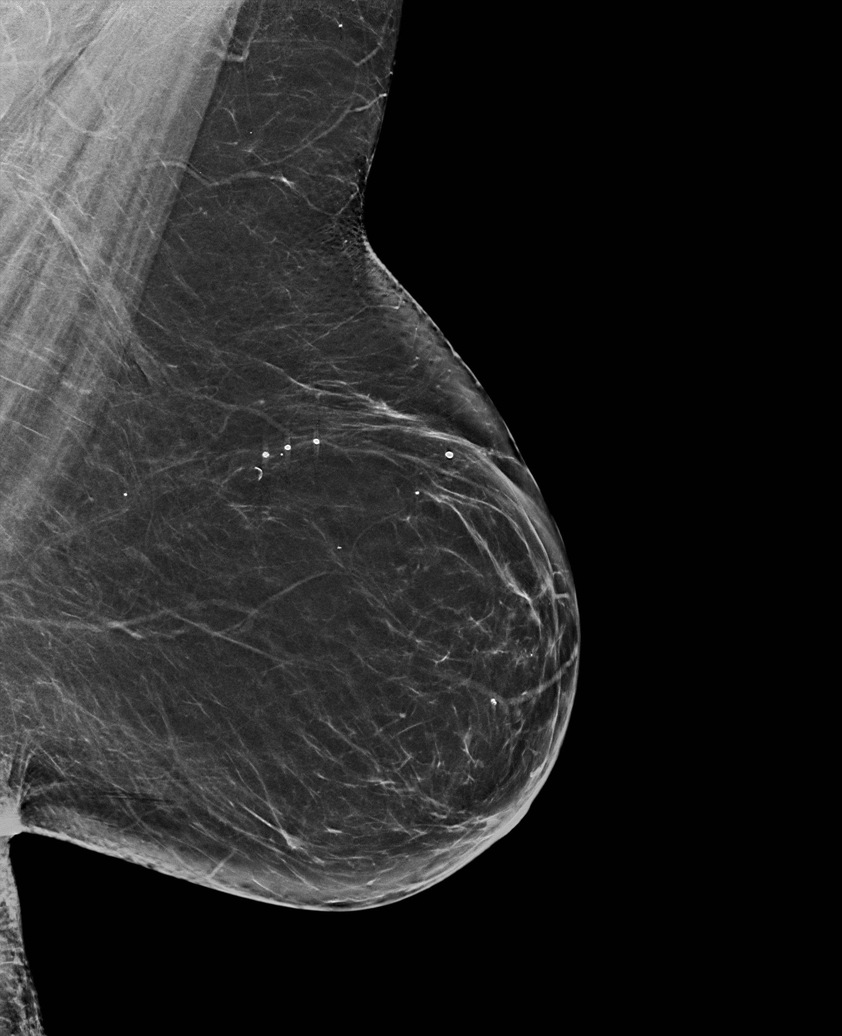

[R CC synth-2D]
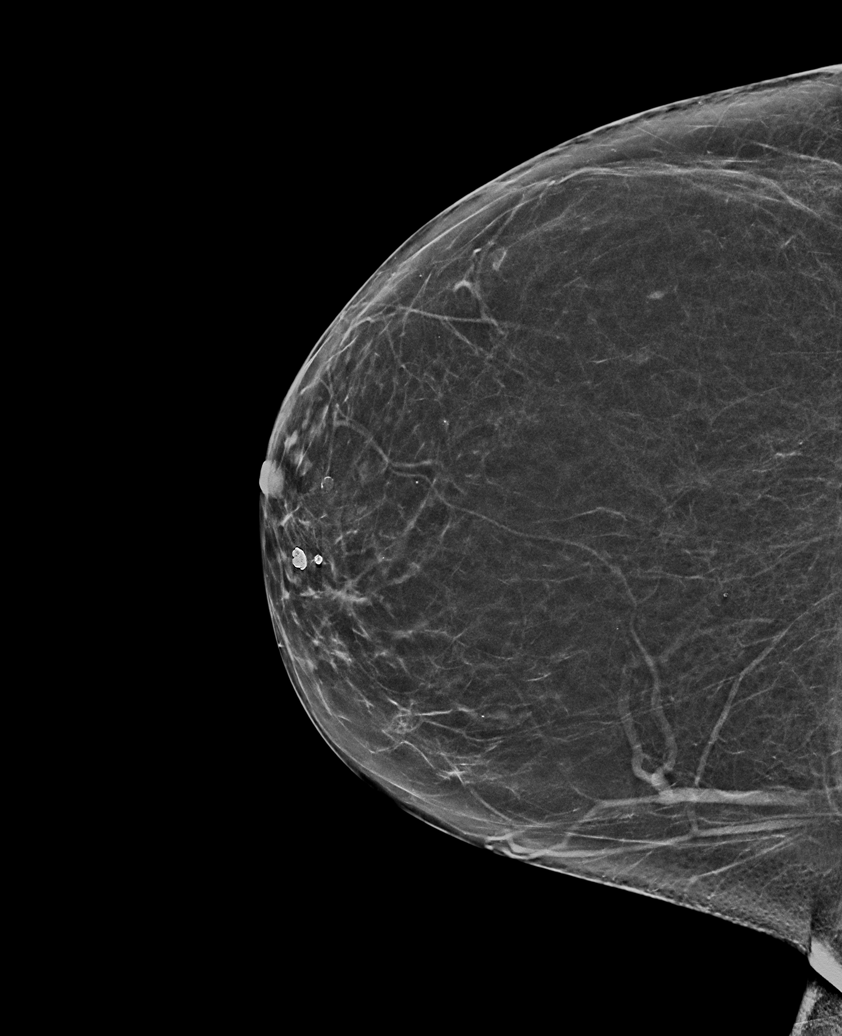

[R MLO tomo · tomo slice 35/68.0]
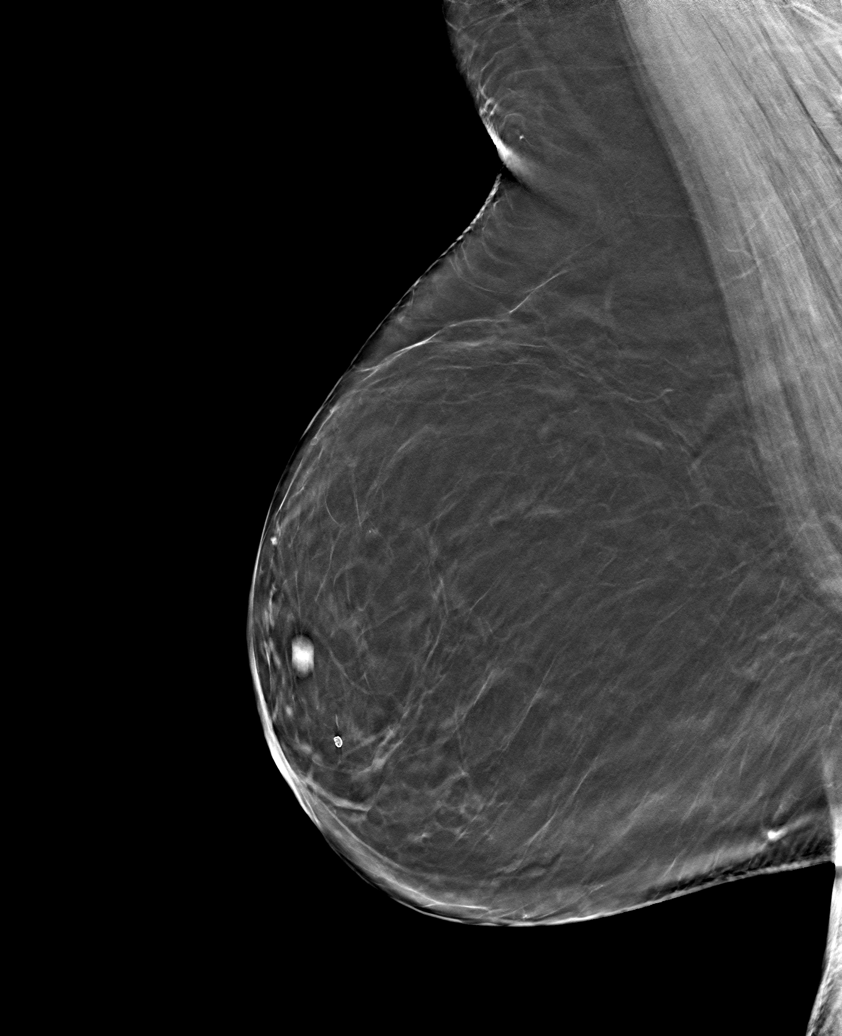

[6 of 30 positions shown; findings below may reference images not displayed]

ACR Breast Density Category b: There are scattered areas of
fibroglandular density.
FINDINGS: There are no findings suspicious for malignancy. The images were
evaluated with computer-aided detection.
IMPRESSION: No mammographic evidence of malignancy. A result letter of this
screening mammogram will be mailed directly to the patient.

RECOMMENDATION:
Screening mammogram in one year. (Code:[id number])

BI-RADS CATEGORY  1: Negative.

## 2022-11-11 ENCOUNTER — Other Ambulatory Visit: Payer: Self-pay | Admitting: Internal Medicine

## 2022-11-11 DIAGNOSIS — R252 Cramp and spasm: Secondary | ICD-10-CM

## 2022-11-11 DIAGNOSIS — M62838 Other muscle spasm: Secondary | ICD-10-CM

## 2022-11-12 ENCOUNTER — Other Ambulatory Visit: Payer: Self-pay | Admitting: Internal Medicine

## 2022-11-12 DIAGNOSIS — R252 Cramp and spasm: Secondary | ICD-10-CM

## 2022-11-12 DIAGNOSIS — M62838 Other muscle spasm: Secondary | ICD-10-CM

## 2022-11-15 ENCOUNTER — Other Ambulatory Visit: Payer: Self-pay

## 2022-11-15 DIAGNOSIS — R252 Cramp and spasm: Secondary | ICD-10-CM

## 2022-11-15 DIAGNOSIS — M62838 Other muscle spasm: Secondary | ICD-10-CM

## 2022-11-15 MED ORDER — TIZANIDINE HCL 4 MG PO TABS
4.0000 mg | ORAL_TABLET | Freq: Every evening | ORAL | 0 refills | Status: DC | PRN
Start: 2022-11-15 — End: 2023-01-04

## 2022-11-15 NOTE — Telephone Encounter (Signed)
Requested medication (s) are due for refill today: routing for approval  Requested medication (s) are on the active medication list: yes  Last refill:  10/08/22  Future visit scheduled: yes  Notes to clinic:  Unable to refill per protocol, cannot delegate.      Requested Prescriptions  Pending Prescriptions Disp Refills   tiZANidine (ZANAFLEX) 4 MG tablet [Pharmacy Med Name: TIZANIDINE HCL 4 MG TABLET] 30 tablet 0    Sig: TAKE 1 TABLET BY MOUTH EVERY NIGHT AT BEDTIME AS NEEDED FOR MUSCLE SPASMS     Not Delegated - Cardiovascular:  Alpha-2 Agonists - tizanidine Failed - 11/12/2022  7:34 PM      Failed - This refill cannot be delegated      Passed - Valid encounter within last 6 months    Recent Outpatient Visits           1 month ago Bilateral leg cramps   [facility] Margarita Mail, DO   2 months ago Controlled type 2 diabetes with neuropathy [facility])   [facility] Alba Cory, MD   3 months ago Essential hypertension   [facility] Mecum, Oswaldo Conroy, PA-C   4 months ago Acute embolism and thrombosis of superficial vein of left upper extremity   [facility] Alba Cory, MD   5 months ago Phlebitis or thrombophlebitis of lower extremity   [facility] Danelle Berry, [place]       Future Appointments             In 1 month Alba Cory, MD [facility], [facility]

## 2022-11-19 ENCOUNTER — Other Ambulatory Visit: Payer: Self-pay | Admitting: Family Medicine

## 2022-11-19 DIAGNOSIS — F3340 Major depressive disorder, recurrent, in remission, unspecified: Secondary | ICD-10-CM

## 2022-11-25 ENCOUNTER — Other Ambulatory Visit: Payer: Self-pay | Admitting: Oncology

## 2022-11-25 DIAGNOSIS — Z006 Encounter for examination for normal comparison and control in clinical research program: Secondary | ICD-10-CM

## 2022-11-26 ENCOUNTER — Other Ambulatory Visit: Payer: Self-pay | Admitting: Family Medicine

## 2022-11-26 DIAGNOSIS — E039 Hypothyroidism, unspecified: Secondary | ICD-10-CM

## 2022-11-26 DIAGNOSIS — R252 Cramp and spasm: Secondary | ICD-10-CM

## 2022-11-26 DIAGNOSIS — I10 Essential (primary) hypertension: Secondary | ICD-10-CM

## 2022-12-01 ENCOUNTER — Other Ambulatory Visit: Payer: Self-pay | Admitting: Family Medicine

## 2022-12-01 DIAGNOSIS — F3340 Major depressive disorder, recurrent, in remission, unspecified: Secondary | ICD-10-CM

## 2022-12-01 DIAGNOSIS — J4520 Mild intermittent asthma, uncomplicated: Secondary | ICD-10-CM

## 2022-12-15 ENCOUNTER — Other Ambulatory Visit (INDEPENDENT_AMBULATORY_CARE_PROVIDER_SITE_OTHER): Payer: Self-pay

## 2022-12-16 MED ORDER — ALPRAZOLAM 0.5 MG PO TABS
ORAL_TABLET | ORAL | 0 refills | Status: DC
Start: 2023-02-21 — End: 2023-03-31

## 2022-12-16 MED ORDER — ALPRAZOLAM 0.5 MG PO TABS: ORAL_TABLET | ORAL | 0 refills | Status: DC

## 2022-12-30 DIAGNOSIS — Z0100 Encounter for examination of eyes and vision without abnormal findings: Secondary | ICD-10-CM | POA: Diagnosis not present

## 2022-12-30 DIAGNOSIS — E113211 Type 2 diabetes mellitus with mild nonproliferative diabetic retinopathy with macular edema, right eye: Secondary | ICD-10-CM | POA: Diagnosis not present

## 2022-12-30 DIAGNOSIS — H538 Other visual disturbances: Secondary | ICD-10-CM | POA: Diagnosis not present

## 2022-12-30 DIAGNOSIS — M3501 Sicca syndrome with keratoconjunctivitis: Secondary | ICD-10-CM | POA: Diagnosis not present

## 2022-12-30 DIAGNOSIS — H43813 Vitreous degeneration, bilateral: Secondary | ICD-10-CM | POA: Diagnosis not present

## 2022-12-30 DIAGNOSIS — H2513 Age-related nuclear cataract, bilateral: Secondary | ICD-10-CM | POA: Diagnosis not present

## 2022-12-30 LAB — HM DIABETES EYE EXAM

## 2023-01-03 ENCOUNTER — Encounter: Payer: Self-pay | Admitting: Family Medicine

## 2023-01-03 DIAGNOSIS — E113299 Type 2 diabetes mellitus with mild nonproliferative diabetic retinopathy without macular edema, unspecified eye: Secondary | ICD-10-CM | POA: Insufficient documentation

## 2023-01-03 NOTE — Progress Notes (Unsigned)
Name: Sarah Gordon   MRN: [id number]    DOB: [birth year]   Date:01/04/2023       Progress Note  Subjective  Chief Complaint  Follow Up  HPI  Memory changes: seen by Dr. Sherryll Burger, had MRI and went to eye doctor and eye pressure normal, had one B12 injection , and had switched to SL B!2 but lately not taking supplements - recent labs also showed low folic acid. Explained importance of taking both forever. She has significant history of dementia She states symptoms are stable.   IMPRESSION:  1. Partially empty sella which can be a normal variant but can also  be seen in the setting of intracranial hypertension. Otherwise  normal appearance of the brain.  2. Small left mastoid effusion.   HTN: taking Olmasartan hctz and Toprol XL , bp has improved. Denies chest pain or palpitation    GERD: she stopped FODMAP diet, doing well now, taking omeprazole BID She saw GI and evaluated for bloating and is doing much better.    Ventricular flutter: seen by cardiologist, currently on metoprolol and is doing well  MDD: she is taking Seroquel to sleep, Duloxetine for mood. She feels tired all the time. She worries about family members. Her sister that had leg amputated is not doing well emotionally and cannot tolerate prosthesis do to pain on her stump. Two sisters have dementia. She states recently feeling more down, lack of motivation and spent a couple of days in isolation.    DMII with dyslipidemia/HTN/neuropathy and background retinopathy: she was doing well on diet only, A1C went down from 6.7% to 6.5 % and today is down 6.3 %. Denies polyphagia, polydipsia or polyuria She has a history of neuropathy, very mild symptoms. She is on Zetia , states forgetting to use Repatha - advised to try on the 1st and 15 th of each month.  She is unable to tolerate statin therapy.   Sicca: going on for over one year but getting progressively worse, she also has joint aches . Sjogren's test negative advised to discuss  with dentist    DDD lumbar spine: she had MRI done back in 2010 and showed herniated disc, she had steroid injections twice and it helped. Intermittent symptoms that radiates down both legs, she has noticed increase of lower leg pain, has to get up at night to walk, cramping like, worse on right lower leg than left lower leg . Currently zero pain. She states mild improvement of jerking and muscle cramps at night with Requip plus tizanidine and we will refill medications    OA: knees and feet, also has FMS, she states her legs are doing better, except for when radiates down her legs.She uses a walker when she walks longer distances or when back is flaring Stable    Asthma: She has occasional dry cough, SOB and wheezing a couple of times a month . She stopped using Flovent and doing okay at this time . She still has singulair and ventolin at home    Morbid Obesity: BMI above 40, her weight is stable at home.   Hypothyroidism: she is taking medication daily, denies hair loss, she has chronic dry skin and chronic constipation Last TSH was at goal . We will continue current dose   Patient Active Problem List   Diagnosis Date Noted   Background diabetic retinopathy (HCC) 01/03/2023   Varicose veins with inflammation 07/16/2022   Cognitive complaints 10/27/2021   Polyp of colon    Lumbar  radiculopathy 11/06/2020   Ventricular flutter (HCC) 12/06/2018   MDD (major depressive disorder), recurrent episode, mild (HCC) 09/04/2018   DDD (degenerative disc disease), cervical 01/23/2016   Degenerative joint disease of left acromioclavicular joint 01/23/2016   Recurrent oral herpes simplex infection 09/01/2015   Irritable bowel syndrome with diarrhea 08/07/2015   Statin intolerance 02/28/2015   Asthma, intermittent 09/10/2014   Controlled type 2 diabetes with neuropathy (HCC) 09/10/2014   Fibromyalgia 09/10/2014   Obstructive sleep apnea 09/10/2014   Hyperlipidemia 12/29/2012   Essential hypertension  12/29/2012   Morbid obesity (HCC) 12/29/2012   Arthritis due to gout 12/13/2007   Acid reflux 10/24/2006   Adult hypothyroidism 10/24/2006   Localized osteoarthrosis 10/24/2006    Past Surgical History:  Procedure Laterality Date   BREAST BIOPSY Left    benign   COLONOSCOPY     Dr Thurmond Butts   COLONOSCOPY WITH PROPOFOL N/A 11/19/2015   Procedure: COLONOSCOPY WITH PROPOFOL;  Surgeon: Kieth Brightly, MD;  Location: [facility] ENDOSCOPY;  Service: Endoscopy;  Laterality: N/A;   COLONOSCOPY WITH PROPOFOL N/A 06/18/2021   Procedure: COLONOSCOPY WITH PROPOFOL;  Surgeon: Toney Reil, MD;  Location: [facility] ENDOSCOPY;  Service: Gastroenterology;  Laterality: N/A;   ESOPHAGOGASTRODUODENOSCOPY N/A 08/20/2014   Procedure: ESOPHAGOGASTRODUODENOSCOPY (EGD);  Surgeon: Kieth Brightly, MD;  Location: [facility] ENDOSCOPY;  Service: Endoscopy;  Laterality: N/A;   ESOPHAGOGASTRODUODENOSCOPY (EGD) WITH PROPOFOL N/A 06/18/2021   Procedure: ESOPHAGOGASTRODUODENOSCOPY (EGD) WITH PROPOFOL;  Surgeon: Toney Reil, MD;  Location: [facility] ENDOSCOPY;  Service: Gastroenterology;  Laterality: N/A;   FOOT SURGERY Left    heel spur   HERNIA REPAIR     umbilical   NASAL SINUS SURGERY  1980s   UMBILICAL HERNIA REPAIR      Family History  Problem Relation Age of Onset   Hypertension Mother    Hyperlipidemia Mother    Diabetes Mother    Heart disease Mother    Stroke Mother    Varicose Veins Mother    Alcohol abuse Father    Lupus Sister    Arthritis Sister    Depression Sister    Obesity Sister    Asthma Sister    Depression Sister    Hypertension Sister    Miscarriages / Stillbirths Sister    Varicose Veins Sister    Depression Sister    Arthritis Sister    Heart disease Sister    Hyperlipidemia Sister    Hypertension Sister    Miscarriages / [place] Sister    Varicose Veins Sister    Miscarriages / Stillbirths Sister    Arthritis Sister    Depression Sister    Heart disease Sister     Hyperlipidemia Sister    Hypertension Sister    Heart disease Brother 38       CABG x 3    Heart attack Brother 41   Hyperlipidemia Brother    Hypertension Brother    Diabetes Brother    Hypertension Brother    Obesity Brother    Asthma Brother    Depression Brother    Hyperlipidemia Brother    Alcohol abuse Brother    Heart disease Brother    Stroke Brother    Varicose Veins Brother    Early death Brother    Stroke Brother    Arthritis Brother    Depression Brother    Hyperlipidemia Brother    Hypertension Brother    Stroke Maternal Grandfather    Breast cancer Neg Hx     Social  History   Tobacco Use   Smoking status: Never   Smokeless tobacco: Never  Substance Use Topics   Alcohol use: No    Comment: RARELY     Current Outpatient Medications:    acetaminophen (TYLENOL) 650 MG CR tablet, Take 1,300 mg by mouth 2 (two) times daily., Disp: , Rfl:    albuterol (VENTOLIN HFA) 108 (90 Base) MCG/ACT inhaler, Inhale 2 puffs into the lungs every 6 (six) hours as needed for wheezing or shortness of breath., Disp: 1 each, Rfl: 2   [START ON 01/21/2023] ALPRAZolam (XANAX) 0.5 MG tablet, Take 1 tab one hour before procedure and 1 tab when you arrive in office, Disp: 2 tablet, Rfl: 0   [START ON 02/21/2023] ALPRAZolam (XANAX) 0.5 MG tablet, Take 1 tab one hour before procedure and 1 tab when you arrive in office, Disp: 2 tablet, Rfl: 0   buPROPion (WELLBUTRIN XL) 150 MG 24 hr tablet, Take 1 tablet (150 mg total) by mouth in the morning., Disp: 90 tablet, Rfl: 0   Evolocumab (REPATHA) 140 MG/ML SOSY, Inject 140 mg into the skin every 14 (fourteen) days., Disp: 6.3 mL, Rfl: 2   folic acid (FOLVITE) 1 MG tablet, Take 1 tablet (1 mg total) by mouth daily., Disp: 90 tablet, Rfl: 1   hydrOXYzine (ATARAX) 10 MG tablet, Take 1 tablet (10 mg total) by mouth 3 (three) times daily as needed., Disp: 30 tablet, Rfl: 0   Multiple Vitamin (MULTIVITAMIN) tablet, Take 1 tablet by mouth daily.,  Disp: , Rfl:    DULoxetine (CYMBALTA) 60 MG capsule, Take 1 capsule (60 mg total) by mouth daily., Disp: 90 capsule, Rfl: 0   ezetimibe (ZETIA) 10 MG tablet, Take 1 tablet (10 mg total) by mouth daily., Disp: 90 tablet, Rfl: 1   levothyroxine (SYNTHROID) 100 MCG tablet, TAKE 1 TABLET BY MOUTH DAILY, Disp: 90 tablet, Rfl: 0   metoprolol succinate (TOPROL-XL) 25 MG 24 hr tablet, Take 1 tablet (25 mg total) by mouth daily., Disp: 90 tablet, Rfl: 1   montelukast (SINGULAIR) 10 MG tablet, Take 1 tablet (10 mg total) by mouth at bedtime., Disp: 90 tablet, Rfl: 0   olmesartan-hydrochlorothiazide (BENICAR HCT) 40-12.5 MG tablet, Take 1 tablet by mouth daily., Disp: 90 tablet, Rfl: 0   omeprazole (PRILOSEC) 20 MG capsule, Take 1 capsule (20 mg total) by mouth 2 (two) times daily before a meal., Disp: 180 capsule, Rfl: 1   QUEtiapine (SEROQUEL) 25 MG tablet, Take 1 tablet (25 mg total) by mouth at bedtime., Disp: 90 tablet, Rfl: 0   rOPINIRole (REQUIP) 0.5 MG tablet, Take 1 tablet (0.5 mg total) by mouth at bedtime., Disp: 90 tablet, Rfl: 1   tiZANidine (ZANAFLEX) 4 MG tablet, Take 1 tablet (4 mg total) by mouth at bedtime as needed for muscle spasms., Disp: 90 tablet, Rfl: 1  Allergies  Allergen Reactions   Vascepa [Icosapent Ethyl] Hives and Swelling   Aspir-81 [Aspirin] Other (See Comments)    Coagulation disorder   Gabapentin Other (See Comments)    Cognitive/memory issues   Metformin And Related     indigestion   Mounjaro [Tirzepatide] Diarrhea    Epigastric pain    Statins Other (See Comments)   Topamax [Topiramate] Other (See Comments)    Cognitive issues    Voltaren [Diclofenac Sodium] Hives and Other (See Comments)   Lipitor [Atorvastatin]     Joint pain    I personally reviewed active problem list, medication list, allergies, family history, social history, health  maintenance with the patient/caregiver today.   ROS  Ten systems reviewed and is negative except as mentioned in HPI    Objective  Vitals:   01/04/23 1008  BP: 130/78  Pulse: 96  Resp: 18  Temp: 97.8 F (36.6 C)  TempSrc: Oral  SpO2: 100%  Weight: 275 lb 9.6 oz (125 kg)  Height: 5\' 5"  (1.651 m)    Body mass index is 45.86 kg/m.  Physical Exam  Constitutional: Patient appears well-developed and well-nourished. Obese  No distress.  HEENT: head atraumatic, normocephalic, pupils equal and reactive to light, neck supple Cardiovascular: Normal rate, regular rhythm and normal heart sounds.  No murmur heard. No BLE edema. Varicose veins  Pulmonary/Chest: Effort normal and breath sounds normal. No respiratory distress. Abdominal: Soft.  There is no tenderness. Psychiatric: Patient has a normal mood and affect. behavior is normal. Judgment and thought content normal.    PHQ2/9:    01/04/2023   10:16 AM 10/08/2022    8:34 AM 09/01/2022    8:25 AM 07/19/2022    8:36 AM 06/21/2022    9:08 AM  Depression screen PHQ 2/9  Decreased Interest 1 0 1 1 0  Down, Depressed, Hopeless 1 0 1 1 0  PHQ - 2 Score 2 0 2 2 0  Altered sleeping 2 0 1 1 1   Tired, decreased energy 2 0 1 1 0  Change in appetite 1 0 0 0 0  Feeling bad or failure about yourself  0 0 0 0 0  Trouble concentrating 0 0 0 0 0  Moving slowly or fidgety/restless 0 0 0 0 0  Suicidal thoughts 0 0 0 0 0  PHQ-9 Score 7 0 4 4 1   Difficult doing work/chores Somewhat difficult Not difficult at all  Somewhat difficult     phq 9 is positive    Fall Risk:    01/04/2023   10:10 AM 10/08/2022    8:31 AM 09/01/2022    8:25 AM 07/19/2022    8:36 AM 06/21/2022    9:08 AM  Fall Risk   Falls in the past year? 0 1 1 1 1   Number falls in past yr:  1 0 1 0  Injury with Fall?  0 1 1 0  Risk for fall due to : Impaired balance/gait  Impaired mobility  No Fall Risks  Follow up Falls prevention discussed;Education provided;Falls evaluation completed  Falls prevention discussed  Falls prevention discussed    Assessment & Plan  1. Controlled type 2  diabetes with neuropathy (HCC)  - POCT HgB A1C  2. MDD (recurrent major depressive disorder) in remission (HCC)  - QUEtiapine (SEROQUEL) 25 MG tablet; Take 1 tablet (25 mg total) by mouth at bedtime.  Dispense: 90 tablet; Refill: 0 - DULoxetine (CYMBALTA) 60 MG capsule; Take 1 capsule (60 mg total) by mouth daily.  Dispense: 90 capsule; Refill: 0 - buPROPion (WELLBUTRIN XL) 150 MG 24 hr tablet; Take 1 tablet (150 mg total) by mouth in the morning.  Dispense: 90 tablet; Refill: 0  3. Dyslipidemia associated with type 2 diabetes mellitus (HCC)  - ezetimibe (ZETIA) 10 MG tablet; Take 1 tablet (10 mg total) by mouth daily.  Dispense: 90 tablet; Refill: 1  4. Gastroesophageal reflux disease without esophagitis  - omeprazole (PRILOSEC) 20 MG capsule; Take 1 capsule (20 mg total) by mouth 2 (two) times daily before a meal.  Dispense: 180 capsule; Refill: 1  5. Nocturnal muscle cramps  - tiZANidine (ZANAFLEX) 4 MG tablet;  Take 1 tablet (4 mg total) by mouth at bedtime as needed for muscle spasms.  Dispense: 90 tablet; Refill: 1 - rOPINIRole (REQUIP) 0.5 MG tablet; Take 1 tablet (0.5 mg total) by mouth at bedtime.  Dispense: 90 tablet; Refill: 1  6. Essential hypertension  - olmesartan-hydrochlorothiazide (BENICAR HCT) 40-12.5 MG tablet; Take 1 tablet by mouth daily.  Dispense: 90 tablet; Refill: 0 - metoprolol succinate (TOPROL-XL) 25 MG 24 hr tablet; Take 1 tablet (25 mg total) by mouth daily.  Dispense: 90 tablet; Refill: 1  7. Mild intermittent asthma without complication  - montelukast (SINGULAIR) 10 MG tablet; Take 1 tablet (10 mg total) by mouth at bedtime.  Dispense: 90 tablet; Refill: 0  8. Adult hypothyroidism  - levothyroxine (SYNTHROID) 100 MCG tablet; TAKE 1 TABLET BY MOUTH DAILY  Dispense: 90 tablet; Refill: 0  9. Low folic acid  - folic acid (FOLVITE) 1 MG tablet; Take 1 tablet (1 mg total) by mouth daily.  Dispense: 90 tablet; Refill: 1

## 2023-01-04 ENCOUNTER — Encounter: Payer: Self-pay | Admitting: Family Medicine

## 2023-01-04 ENCOUNTER — Ambulatory Visit (INDEPENDENT_AMBULATORY_CARE_PROVIDER_SITE_OTHER): Payer: Medicare HMO | Admitting: Family Medicine

## 2023-01-04 VITALS — BP 130/78 | HR 96 | Temp 97.8°F | Resp 18 | Ht 65.0 in | Wt 275.6 lb

## 2023-01-04 DIAGNOSIS — F3340 Major depressive disorder, recurrent, in remission, unspecified: Secondary | ICD-10-CM | POA: Diagnosis not present

## 2023-01-04 DIAGNOSIS — J4520 Mild intermittent asthma, uncomplicated: Secondary | ICD-10-CM

## 2023-01-04 DIAGNOSIS — R252 Cramp and spasm: Secondary | ICD-10-CM

## 2023-01-04 DIAGNOSIS — I10 Essential (primary) hypertension: Secondary | ICD-10-CM | POA: Diagnosis not present

## 2023-01-04 DIAGNOSIS — K219 Gastro-esophageal reflux disease without esophagitis: Secondary | ICD-10-CM

## 2023-01-04 DIAGNOSIS — E1169 Type 2 diabetes mellitus with other specified complication: Secondary | ICD-10-CM | POA: Diagnosis not present

## 2023-01-04 DIAGNOSIS — E538 Deficiency of other specified B group vitamins: Secondary | ICD-10-CM | POA: Diagnosis not present

## 2023-01-04 DIAGNOSIS — E1140 Type 2 diabetes mellitus with diabetic neuropathy, unspecified: Secondary | ICD-10-CM | POA: Diagnosis not present

## 2023-01-04 DIAGNOSIS — E039 Hypothyroidism, unspecified: Secondary | ICD-10-CM

## 2023-01-04 DIAGNOSIS — E785 Hyperlipidemia, unspecified: Secondary | ICD-10-CM

## 2023-01-04 LAB — POCT GLYCOSYLATED HEMOGLOBIN (HGB A1C): Hemoglobin A1C: 6.3 % — AB (ref 4.0–5.6)

## 2023-01-04 MED ORDER — QUETIAPINE FUMARATE 25 MG PO TABS: 25.0000 mg | ORAL_TABLET | Freq: Every day | ORAL | 0 refills | Status: DC

## 2023-01-04 MED ORDER — EZETIMIBE 10 MG PO TABS: 10.0000 mg | ORAL_TABLET | Freq: Every day | ORAL | 1 refills | Status: DC

## 2023-01-04 MED ORDER — OMEPRAZOLE 20 MG PO CPDR: 20.0000 mg | DELAYED_RELEASE_CAPSULE | Freq: Two times a day (BID) | ORAL | 1 refills | Status: DC

## 2023-01-04 MED ORDER — METOPROLOL SUCCINATE ER 25 MG PO TB24: 25.0000 mg | ORAL_TABLET | Freq: Every day | ORAL | 1 refills | Status: DC

## 2023-01-04 MED ORDER — FOLIC ACID 1 MG PO TABS
1.0000 mg | ORAL_TABLET | Freq: Every day | ORAL | 1 refills | Status: DC
Start: 2023-01-04 — End: 2023-05-23

## 2023-01-04 MED ORDER — ROPINIROLE HCL 0.5 MG PO TABS: 0.5000 mg | ORAL_TABLET | Freq: Every day | ORAL | 1 refills | Status: DC

## 2023-01-04 MED ORDER — TIZANIDINE HCL 4 MG PO TABS
4.0000 mg | ORAL_TABLET | Freq: Every evening | ORAL | 1 refills | Status: DC | PRN
Start: 2023-01-04 — End: 2023-07-04

## 2023-01-04 MED ORDER — BUPROPION HCL ER (XL) 150 MG PO TB24
150.0000 mg | ORAL_TABLET | Freq: Every morning | ORAL | 0 refills | Status: DC
Start: 2023-01-04 — End: 2023-04-21

## 2023-01-04 MED ORDER — LEVOTHYROXINE SODIUM 100 MCG PO TABS: ORAL_TABLET | ORAL | 0 refills | Status: DC

## 2023-01-04 MED ORDER — MONTELUKAST SODIUM 10 MG PO TABS: 10.0000 mg | ORAL_TABLET | Freq: Every day | ORAL | 0 refills | Status: DC

## 2023-01-04 MED ORDER — DULOXETINE HCL 60 MG PO CPEP: 60.0000 mg | ORAL_CAPSULE | Freq: Every day | ORAL | 0 refills | Status: DC

## 2023-01-04 MED ORDER — OLMESARTAN MEDOXOMIL-HCTZ 40-12.5 MG PO TABS: 1.0000 | ORAL_TABLET | Freq: Every day | ORAL | 0 refills | Status: DC

## 2023-01-12 DIAGNOSIS — H2512 Age-related nuclear cataract, left eye: Secondary | ICD-10-CM | POA: Diagnosis not present

## 2023-01-13 ENCOUNTER — Other Ambulatory Visit
Admission: RE | Admit: 2023-01-13 | Discharge: 2023-01-13 | Disposition: A | Discharge status: Home / Self Care | Payer: Medicare HMO | Source: Ambulatory Visit | Attending: Oncology | Admitting: Oncology

## 2023-01-13 DIAGNOSIS — Z006 Encounter for examination for normal comparison and control in clinical research program: Secondary | ICD-10-CM | POA: Insufficient documentation

## 2023-01-14 NOTE — Discharge Instructions (Signed)

## 2023-01-17 ENCOUNTER — Encounter: Payer: Self-pay | Admitting: Ophthalmology

## 2023-01-17 NOTE — Anesthesia Preprocedure Evaluation (Signed)
Anesthesia Evaluation  Patient identified by MRN, date of birth, ID band Patient awake    Reviewed: Allergy & Precautions, H&P , NPO status , Patient's Chart, lab work & pertinent test results  Airway Mallampati: III  TM Distance: <3 FB Neck ROM: Full    Dental no notable dental hx. (+) Chipped   Pulmonary asthma , sleep apnea    Pulmonary exam normal breath sounds clear to auscultation       Cardiovascular hypertension, Normal cardiovascular exam Rhythm:Regular Rate:Normal     Neuro/Psych  Headaches PSYCHIATRIC DISORDERS Anxiety Depression     Neuromuscular disease    GI/Hepatic Neg liver ROS,GERD  ,,  Endo/Other  diabetesHypothyroidism    Renal/GU negative Renal ROS  negative genitourinary   Musculoskeletal  (+) Arthritis ,  Fibromyalgia -  Abdominal   Peds negative pediatric ROS (+)  Hematology negative hematology ROS (+)   Anesthesia Other Findings Obstructive sleep apnea (adult) (pediatric) Myalgia and myositis, unspecified diabetes mellitus Irritable bowel syndrome Unspecified hypothyroidism  Mixed hyperlipidemia Unspecified essential hypertension Esophageal reflux Herpes simplex without mention of complication  Localized osteoarthrosis not specified whether primary or secondary, unspecified site Migraine, unspecified, without mention of intractable migraine without mention of status migrainosus  Gouty arthropathy, unspecified Umbilical hernia without mention of obstruction or gangrene  Palpitations Asthma Allergy Cataract Depression Anxiety Sleep apnea Mild cognitive impairment   Reproductive/Obstetrics negative OB ROS                              Anesthesia Physical Anesthesia Plan  ASA: 3  Anesthesia Plan: MAC   Post-op Pain Management:    Induction: Intravenous  PONV Risk Score and Plan:   Airway Management Planned: Natural Airway and Nasal  Cannula  Additional Equipment:   Intra-op Plan:   Post-operative Plan:   Informed Consent: I have reviewed the patients History and Physical, chart, labs and discussed the procedure including the risks, benefits and alternatives for the proposed anesthesia with the patient or authorized representative who has indicated his/her understanding and acceptance.     Dental Advisory Given  Plan Discussed with: Anesthesiologist, CRNA and Surgeon  Anesthesia Plan Comments: (Patient consented for risks of anesthesia including but not limited to:  - adverse reactions to medications - damage to eyes, teeth, lips or other oral mucosa - nerve damage due to positioning  - sore throat or hoarseness - Damage to heart, brain, nerves, lungs, other parts of body or loss of life  Patient voiced understanding and assent.)         Anesthesia Quick Evaluation

## 2023-01-18 ENCOUNTER — Encounter: Payer: Self-pay | Admitting: Ophthalmology

## 2023-01-18 ENCOUNTER — Ambulatory Visit: Payer: Medicare HMO | Admitting: Anesthesiology

## 2023-01-18 ENCOUNTER — Ambulatory Visit: Payer: Self-pay | Admitting: Anesthesiology

## 2023-01-18 ENCOUNTER — Ambulatory Visit
Admission: RE | Admit: 2023-01-18 | Discharge: 2023-01-18 | Disposition: A | Discharge status: Home / Self Care | Payer: Medicare HMO | Attending: Ophthalmology | Admitting: Ophthalmology

## 2023-01-18 ENCOUNTER — Encounter
Admission: RE | Disposition: A | Discharge status: Home / Self Care | Payer: Self-pay | Source: Home / Self Care | Attending: Ophthalmology

## 2023-01-18 ENCOUNTER — Other Ambulatory Visit: Payer: Self-pay

## 2023-01-18 DIAGNOSIS — G4733 Obstructive sleep apnea (adult) (pediatric): Secondary | ICD-10-CM | POA: Diagnosis not present

## 2023-01-18 DIAGNOSIS — M797 Fibromyalgia: Secondary | ICD-10-CM | POA: Diagnosis not present

## 2023-01-18 DIAGNOSIS — I10 Essential (primary) hypertension: Secondary | ICD-10-CM | POA: Diagnosis not present

## 2023-01-18 DIAGNOSIS — E039 Hypothyroidism, unspecified: Secondary | ICD-10-CM | POA: Insufficient documentation

## 2023-01-18 DIAGNOSIS — H2512 Age-related nuclear cataract, left eye: Secondary | ICD-10-CM | POA: Insufficient documentation

## 2023-01-18 DIAGNOSIS — K219 Gastro-esophageal reflux disease without esophagitis: Secondary | ICD-10-CM | POA: Insufficient documentation

## 2023-01-18 DIAGNOSIS — E1136 Type 2 diabetes mellitus with diabetic cataract: Secondary | ICD-10-CM | POA: Diagnosis not present

## 2023-01-18 HISTORY — DX: Mild cognitive impairment of uncertain or unknown etiology: G31.84

## 2023-01-18 HISTORY — PX: CATARACT EXTRACTION W/PHACO: SHX586

## 2023-01-18 SURGERY — PHACOEMULSIFICATION, CATARACT, WITH IOL INSERTION
Anesthesia: Monitor Anesthesia Care | Site: Eye | Laterality: Left

## 2023-01-18 MED ORDER — SIGHTPATH DOSE#1 BSS IO SOLN: INTRAOCULAR | Status: DC | PRN

## 2023-01-18 MED ORDER — FENTANYL CITRATE (PF) 100 MCG/2ML IJ SOLN
INTRAMUSCULAR | Status: AC
  Filled 2023-01-18: qty 2

## 2023-01-18 MED ORDER — MIDAZOLAM HCL 2 MG/2ML IJ SOLN
INTRAMUSCULAR | Status: AC
  Filled 2023-01-18: qty 2

## 2023-01-18 MED ORDER — SIGHTPATH DOSE#1 BSS IO SOLN
INTRAOCULAR | Status: DC | PRN
  Administered 2023-01-18: 15 mL

## 2023-01-18 MED ORDER — MOXIFLOXACIN HCL 0.5 % OP SOLN
OPHTHALMIC | Status: DC | PRN
  Administered 2023-01-18: .2 mL via OPHTHALMIC

## 2023-01-18 MED ORDER — SIGHTPATH DOSE#1 NA CHONDROIT SULF-NA HYALURON 40-17 MG/ML IO SOLN
INTRAOCULAR | Status: DC | PRN
  Administered 2023-01-18: 1 mL via INTRAOCULAR

## 2023-01-18 MED ORDER — BRIMONIDINE TARTRATE-TIMOLOL 0.2-0.5 % OP SOLN
OPHTHALMIC | Status: DC | PRN
  Administered 2023-01-18: 1 [drp] via OPHTHALMIC

## 2023-01-18 MED ORDER — MIDAZOLAM HCL 2 MG/2ML IJ SOLN
INTRAMUSCULAR | Status: DC | PRN
  Administered 2023-01-18: 2 mg via INTRAVENOUS

## 2023-01-18 MED ORDER — TETRACAINE HCL 0.5 % OP SOLN
OPHTHALMIC | Status: AC
  Filled 2023-01-18: qty 4

## 2023-01-18 MED ORDER — ARMC OPHTHALMIC DILATING DROPS
OPHTHALMIC | Status: AC
  Filled 2023-01-18: qty 0.5

## 2023-01-18 MED ORDER — ARMC OPHTHALMIC DILATING DROPS
1.0000 | OPHTHALMIC | Status: DC | PRN
  Administered 2023-01-18 (×3): 1 via OPHTHALMIC

## 2023-01-18 MED ORDER — SIGHTPATH DOSE#1 BSS IO SOLN
INTRAOCULAR | Status: DC | PRN
  Administered 2023-01-18: 43 mL via OPHTHALMIC

## 2023-01-18 MED ORDER — FENTANYL CITRATE (PF) 100 MCG/2ML IJ SOLN
INTRAMUSCULAR | Status: DC | PRN
  Administered 2023-01-18: 50 ug via INTRAVENOUS

## 2023-01-18 MED ORDER — TETRACAINE HCL 0.5 % OP SOLN
1.0000 [drp] | OPHTHALMIC | Status: DC | PRN
  Administered 2023-01-18 (×3): 1 [drp] via OPHTHALMIC

## 2023-01-18 SURGICAL SUPPLY — 18 items
ANGLE REVERSE CUT SHRT 25GA (CUTTER) ×1
CANNULA ANT/CHMB 27G (MISCELLANEOUS) IMPLANT
CANNULA ANT/CHMB 27GA (MISCELLANEOUS)
CATARACT SUITE SIGHTPATH (MISCELLANEOUS) ×1
CYSTOTOME ANGL RVRS SHRT 25G (CUTTER) ×1 IMPLANT
FEE CATARACT SUITE SIGHTPATH (MISCELLANEOUS) ×1 IMPLANT
GLOVE BIOGEL PI IND STRL 8 (GLOVE) ×1 IMPLANT
GLOVE SURG LX STRL 8.0 MICRO (GLOVE) ×1 IMPLANT
LENS CLAREON VIVITY TORIC 18.5 ×1 IMPLANT
LENS CLRN VIVITY TORIC 3 18.5 ×1 IMPLANT
LENS IOL CLRN VT TRC 3 18.5 IMPLANT
NDL FILTER BLUNT 18X1 1/2 (NEEDLE) ×1 IMPLANT
NEEDLE FILTER BLUNT 18X1 1/2 (NEEDLE) ×1
PACK VIT ANT 23G (MISCELLANEOUS) IMPLANT
RING MALYGIN (MISCELLANEOUS) IMPLANT
SUT ETHILON 10-0 CS-B-6CS-B-6 (SUTURE)
SUTURE EHLN 10-0 CS-B-6CS-B-6 (SUTURE) IMPLANT
SYR 3ML LL SCALE MARK (SYRINGE) ×1 IMPLANT

## 2023-01-18 NOTE — H&P (Signed)
[facility]   Primary Care Physician:  Alba Cory, MD Ophthalmologist: Dr. Druscilla Brownie  Pre-Procedure History & Physical: HPI:  Sarah Gordon is a 72 y.o. female here for cataract surgery.   Past Medical History:  Diagnosis Date   Allergy    Anxiety '95   Asthma    Cataract    Depression '95   Esophageal reflux    Gouty arthropathy, unspecified    Herpes simplex without mention of complication    Irritable bowel syndrome    Localized osteoarthrosis not specified whether primary or secondary, unspecified site    Migraine, unspecified, without mention of intractable migraine without mention of status migrainosus    Mild cognitive impairment    Mixed hyperlipidemia    Myalgia and myositis, unspecified    Obstructive sleep apnea (adult) (pediatric)    Palpitations    Sleep apnea    No CPAP   Type II or unspecified type diabetes mellitus without mention of complication, not stated as uncontrolled    Diet controlled   Umbilical hernia without mention of obstruction or gangrene    Unspecified essential hypertension    Unspecified hypothyroidism     Past Surgical History:  Procedure Laterality Date   BREAST BIOPSY Left    benign   COLONOSCOPY     Dr Thurmond Butts   COLONOSCOPY WITH PROPOFOL N/A 11/19/2015   Procedure: COLONOSCOPY WITH PROPOFOL;  Surgeon: Kieth Brightly, MD;  Location: [facility] ENDOSCOPY;  Service: Endoscopy;  Laterality: N/A;   COLONOSCOPY WITH PROPOFOL N/A 06/18/2021   Procedure: COLONOSCOPY WITH PROPOFOL;  Surgeon: Toney Reil, MD;  Location: [facility] ENDOSCOPY;  Service: Gastroenterology;  Laterality: N/A;   ESOPHAGOGASTRODUODENOSCOPY N/A 08/20/2014   Procedure: ESOPHAGOGASTRODUODENOSCOPY (EGD);  Surgeon: Kieth Brightly, MD;  Location: [facility] ENDOSCOPY;  Service: Endoscopy;  Laterality: N/A;   ESOPHAGOGASTRODUODENOSCOPY (EGD) WITH PROPOFOL N/A 06/18/2021   Procedure: ESOPHAGOGASTRODUODENOSCOPY (EGD) WITH PROPOFOL;  Surgeon: Toney Reil,  MD;  Location: [facility] ENDOSCOPY;  Service: Gastroenterology;  Laterality: N/A;   FOOT SURGERY Left    heel spur   HERNIA REPAIR     umbilical   NASAL SINUS SURGERY  1980s   UMBILICAL HERNIA REPAIR      Prior to Admission medications   Medication Sig Start Date End Date Taking? Authorizing Provider  albuterol (VENTOLIN HFA) 108 (90 Base) MCG/ACT inhaler Inhale 2 puffs into the lungs every 6 (six) hours as needed for wheezing or shortness of breath. 05/23/20  Yes Sowles, Danna Hefty, MD  baclofen (LIORESAL) 10 MG tablet Take 10 mg by mouth 3 (three) times daily as needed for muscle spasms.   Yes [provider]  buPROPion (WELLBUTRIN XL) 150 MG 24 hr tablet Take 1 tablet (150 mg total) by mouth in the morning. 01/04/23  Yes Sowles, Danna Hefty, MD  DULoxetine (CYMBALTA) 60 MG capsule Take 1 capsule (60 mg total) by mouth daily. 01/04/23  Yes Sowles, Danna Hefty, MD  ezetimibe (ZETIA) 10 MG tablet Take 1 tablet (10 mg total) by mouth daily. 01/04/23  Yes Sowles, Danna Hefty, MD  folic acid (FOLVITE) 1 MG tablet Take 1 tablet (1 mg total) by mouth daily. 01/04/23  Yes Sowles, Danna Hefty, MD  levothyroxine (SYNTHROID) 100 MCG tablet TAKE 1 TABLET BY MOUTH DAILY 01/04/23  Yes Sowles, Danna Hefty, MD  metoprolol succinate (TOPROL-XL) 25 MG 24 hr tablet Take 1 tablet (25 mg total) by mouth daily. 01/04/23  Yes Sowles, Danna Hefty, MD  montelukast (SINGULAIR) 10 MG tablet Take 1 tablet (10 mg total) by mouth at  bedtime. 01/04/23  Yes Sowles, Danna Hefty, MD  Multiple Vitamin (MULTIVITAMIN) tablet Take 1 tablet by mouth daily.   Yes [provider]  olmesartan-hydrochlorothiazide (BENICAR HCT) 40-12.5 MG tablet Take 1 tablet by mouth daily. 01/04/23  Yes Sowles, Danna Hefty, MD  omeprazole (PRILOSEC) 20 MG capsule Take 1 capsule (20 mg total) by mouth 2 (two) times daily before a meal. 01/04/23  Yes Sowles, Danna Hefty, MD  QUEtiapine (SEROQUEL) 25 MG tablet Take 1 tablet (25 mg total) by mouth at bedtime. 01/04/23  Yes Sowles,  Danna Hefty, MD  rOPINIRole (REQUIP) 0.5 MG tablet Take 1 tablet (0.5 mg total) by mouth at bedtime. 01/04/23  Yes Sowles, Danna Hefty, MD  tiZANidine (ZANAFLEX) 4 MG tablet Take 1 tablet (4 mg total) by mouth at bedtime as needed for muscle spasms. 01/04/23  Yes Sowles, Danna Hefty, MD  acetaminophen (TYLENOL) 650 MG CR tablet Take 1,300 mg by mouth 2 (two) times daily.    [provider]  ALPRAZolam Prudy Feeler) 0.5 MG tablet Take 1 tab one hour before procedure and 1 tab when you arrive in office 01/21/23   Georgiana Spinner, NP  ALPRAZolam Prudy Feeler) 0.5 MG tablet Take 1 tab one hour before procedure and 1 tab when you arrive in office 02/21/23   Georgiana Spinner, NP  Evolocumab (REPATHA) 140 MG/ML SOSY Inject 140 mg into the skin every 14 (fourteen) days. Patient not taking: Reported on 01/17/2023 09/01/22   Alba Cory, MD  hydrOXYzine (ATARAX) 10 MG tablet Take 1 tablet (10 mg total) by mouth 3 (three) times daily as needed. 01/21/22   Alba Cory, MD  Fluticasone-Salmeterol (ADVAIR DISKUS) 250-50 MCG/DOSE AEPB Inhale 1 puff into the lungs 2 (two) times daily. 04/11/19 07/22/19  Alba Cory, MD    Allergies as of 01/04/2023 - Review Complete 01/04/2023  Allergen Reaction Noted   Vascepa [icosapent ethyl] Hives and Swelling 07/22/2014   Aspir-81 [aspirin] Other (See Comments) 07/22/2014   Gabapentin Other (See Comments) 05/28/2021   Metformin and related  09/01/2022   Mounjaro [tirzepatide] Diarrhea 06/21/2022   Statins Other (See Comments) 09/01/2015   Topamax [topiramate] Other (See Comments) 12/28/2012   Voltaren [diclofenac sodium] Hives and Other (See Comments) 12/28/2012   Lipitor [atorvastatin]  02/04/2015    Family History  Problem Relation Age of Onset   Hypertension Mother    Hyperlipidemia Mother    Diabetes Mother    Heart disease Mother    Stroke Mother    Varicose Veins Mother    Alcohol abuse Father    Lupus Sister    Arthritis Sister    Depression Sister    Obesity  Sister    Asthma Sister    Depression Sister    Hypertension Sister    Miscarriages / Stillbirths Sister    Varicose Veins Sister    Depression Sister    Arthritis Sister    Heart disease Sister    Hyperlipidemia Sister    Hypertension Sister    Miscarriages / [place] Sister    Varicose Veins Sister    Miscarriages / Stillbirths Sister    Arthritis Sister    Depression Sister    Heart disease Sister    Hyperlipidemia Sister    Hypertension Sister    Heart disease Brother 81       CABG x 3    Heart attack Brother 25   Hyperlipidemia Brother    Hypertension Brother    Diabetes Brother    Hypertension Brother    Obesity Brother  Asthma Brother    Depression Brother    Hyperlipidemia Brother    Alcohol abuse Brother    Heart disease Brother    Stroke Brother    Varicose Veins Brother    Early death Brother    Stroke Brother    Arthritis Brother    Depression Brother    Hyperlipidemia Brother    Hypertension Brother    Stroke Maternal Grandfather    Breast cancer Neg Hx     Social History   Socioeconomic History   Marital status: Married    Spouse name: Harrell Gave    Number of children: 1   Years of education: Not on file   Highest education level: Some college, no degree  Occupational History    Comment: works part time at [employer]  Tobacco Use   Smoking status: Never   Smokeless tobacco: Never  Vaping Use   Vaping status: Never Used  Substance and Sexual Activity   Alcohol use: No   Drug use: No   Sexual activity: Not Currently    Birth control/protection: Post-menopausal  Other Topics Concern   Not on file  Social History Narrative   Not on file   Social Determinants of Health   Financial Resource Strain: Low Risk  (01/03/2023)   Overall Financial Resource Strain (CARDIA)    Difficulty of Paying Living Expenses: Not very hard  Food Insecurity: No Food Insecurity (01/03/2023)   Hunger Vital Sign    Worried About Running Out of Food in the Last  Year: Never true    Ran Out of Food in the Last Year: Never true  Transportation Needs: No Transportation Needs (01/03/2023)   PRAPARE - Administrator, Civil Service (Medical): No    Lack of Transportation (Non-Medical): No  Physical Activity: Insufficiently Active (01/03/2023)   Exercise Vital Sign    Days of Exercise per Week: 1 day    Minutes of Exercise per Session: 10 min  Stress: Stress Concern Present (01/03/2023)   [employer] of Occupational Health - Occupational Stress Questionnaire    Feeling of Stress : Rather much  Social Connections: Socially Integrated (01/03/2023)   Social Connection and Isolation Panel [NHANES]    Frequency of Communication with Friends and Family: More than three times a week    Frequency of Social Gatherings with Friends and Family: Three times a week    Attends Religious Services: More than 4 times per year    Active Member of Clubs or Organizations: No    Attends Banker Meetings: More than 4 times per year    Marital Status: Married  Catering manager Violence: Not At Risk (06/04/2022)   Humiliation, Afraid, Rape, and Kick questionnaire    Fear of Current or Ex-Partner: No    Emotionally Abused: No    Physically Abused: No    Sexually Abused: No    Review of Systems: See HPI, otherwise negative ROS  Physical Exam: BP 139/77   Pulse 79   Temp (!) 97.2 F (36.2 C) (Temporal)   Resp 16   Ht 5\' 6"  (1.676 m)   Wt 124.7 kg   SpO2 99%   BMI 44.39 kg/m  General:   Alert, cooperative in NAD Head:  Normocephalic and atraumatic. Respiratory:  Normal work of breathing. Cardiovascular:  RRR  Impression/Plan: Sarah Gordon is here for cataract surgery.  Risks, benefits, limitations, and alternatives regarding cataract surgery have been reviewed with the patient.  Questions have been answered.  All  parties agreeable.   Galen Manila, MD  01/18/2023, 8:19 AM

## 2023-01-18 NOTE — Op Note (Signed)
PREOPERATIVE DIAGNOSIS:  Nuclear sclerotic cataract of the left eye.   POSTOPERATIVE DIAGNOSIS:  Nuclear sclerotic cataract of the left eye.   OPERATIVE PROCEDURE: Procedure(s): CATARACT EXTRACTION PHACO AND INTRAOCULAR LENS PLACEMENT (IOC) LEFT DIABETIC  CLAREON VIVITY TORIC LENS   SURGEON:  Galen Manila, MD.   ANESTHESIA: 1.      Managed anesthesia care. 2.     0.32ml os Shugarcaine was instilled following the paracentesis [email or web address]   COMPLICATIONS:  None.   TECHNIQUE:   Stop and chop    DESCRIPTION OF PROCEDURE:  The patient was examined and consented in the preoperative holding area where the aforementioned topical anesthesia was applied to the left eye.  The patient was brought back to the Operating Room where he was sat upright on the gurney and given a target to fixate upon while the eye was marked at the 3:00 and 9:00 position.  The patient was then reclined on the operating table.  The eye was prepped and draped in the usual sterile ophthalmic fashion and a lid speculum was placed. A paracentesis was created with the side port blade and the anterior chamber was filled with viscoelastic. A near clear corneal incision was performed with the steel keratome. A continuous curvilinear capsulorrhexis was performed with a cystotome followed by the capsulorrhexis forceps. Hydrodissection and hydrodelineation were carried out with BSS on a blunt cannula. The lens was removed in a stop and chop technique and the remaining cortical material was removed with the irrigation-aspiration handpiece. The eye was inflated with viscoelastic and the ZCT lens was placed in the eye and rotated to within a few degrees of the predetermined orientation.  The remaining viscoelastic was removed from the eye.  The Sinskey hook was used to rotate the toric lens into its final resting place at 154 degrees.  0.1 ml of Vigamox was placed in the anterior chamber. The eye was inflated to a physiologic pressure and  found to be watertight.  The eye was dressed with Vigamox.and Combigan. The patient was given protective glasses to wear throughout the day and a shield with which to sleep tonight. The patient was also given drops with which to begin a drop regimen today and will follow-up with me in one day. Implant Name Type Inv. Item Serial No. Manufacturer Lot No. LRB No. Used Action  LENS CLAREON VIVITY TORIC 18.5 - [id number]  LENS CLAREON VIVITY TORIC 18.5 [id number] SIGHTPATH  Left 1 Implanted   Procedure(s) with comments: CATARACT EXTRACTION PHACO AND INTRAOCULAR LENS PLACEMENT (IOC) LEFT DIABETIC  CLAREON VIVITY TORIC LENS (Left) - 4.47 0:31.9  Electronically signed: Galen Manila 10/29/20248:45 AM

## 2023-01-18 NOTE — Anesthesia Postprocedure Evaluation (Signed)
Anesthesia Post Note  Patient: Sarah Gordon  Procedure(s) Performed: CATARACT EXTRACTION PHACO AND INTRAOCULAR LENS PLACEMENT (IOC) LEFT DIABETIC  CLAREON VIVITY TORIC LENS (Left: Eye)  Patient location during evaluation: PACU Anesthesia Type: MAC Level of consciousness: awake and alert Pain management: pain level controlled Vital Signs Assessment: post-procedure vital signs reviewed and stable Respiratory status: spontaneous breathing, nonlabored ventilation, respiratory function stable and patient connected to nasal cannula oxygen Cardiovascular status: stable and blood pressure returned to baseline Postop Assessment: no apparent nausea or vomiting Anesthetic complications: no   No notable events documented.   Last Vitals:  Vitals:   01/18/23 0847 01/18/23 0852  BP: 119/88 122/83  Pulse: 84 75  Resp: 18 14  Temp: (!) 36.4 C   SpO2: 97% 96%    Last Pain:  Vitals:   01/18/23 0852  TempSrc:   PainSc: 0-No pain                 Marisue Humble

## 2023-01-18 NOTE — Transfer of Care (Signed)
Immediate Anesthesia Transfer of Care Note  Patient: Sarah Gordon  Procedure(s) Performed: CATARACT EXTRACTION PHACO AND INTRAOCULAR LENS PLACEMENT (IOC) LEFT DIABETIC  CLAREON VIVITY TORIC LENS (Left: Eye)  Patient Location: PACU  Anesthesia Type: MAC  Level of Consciousness: awake, alert  and patient cooperative  Airway and Oxygen Therapy: Patient Spontanous Breathing and Patient connected to supplemental oxygen  Post-op Assessment: Post-op Vital signs reviewed, Patient's Cardiovascular Status Stable, Respiratory Function Stable, Patent Airway and No signs of Nausea or vomiting  Post-op Vital Signs: Reviewed and stable  Complications: No notable events documented.

## 2023-01-19 ENCOUNTER — Encounter: Payer: Self-pay | Admitting: Ophthalmology

## 2023-01-19 DIAGNOSIS — H2511 Age-related nuclear cataract, right eye: Secondary | ICD-10-CM | POA: Diagnosis not present

## 2023-01-19 NOTE — Anesthesia Preprocedure Evaluation (Addendum)
Anesthesia Evaluation  Patient identified by MRN, date of birth, ID band Patient awake    Reviewed: Allergy & Precautions, H&P , NPO status , Patient's Chart, lab work & pertinent test results  Airway Mallampati: III  TM Distance: <3 FB Neck ROM: Full    Dental no notable dental hx. (+) Chipped   Pulmonary neg pulmonary ROS, asthma , sleep apnea    Pulmonary exam normal breath sounds clear to auscultation       Cardiovascular hypertension, negative cardio ROS Normal cardiovascular exam Rhythm:Regular Rate:Normal     Neuro/Psych  Headaches PSYCHIATRIC DISORDERS Anxiety Depression     Neuromuscular disease negative neurological ROS  negative psych ROS   GI/Hepatic negative GI ROS, Neg liver ROS,GERD  ,,  Endo/Other  negative endocrine ROSdiabetesHypothyroidism    Renal/GU negative Renal ROS  negative genitourinary   Musculoskeletal negative musculoskeletal ROS (+) Arthritis ,  Fibromyalgia -  Abdominal   Peds negative pediatric ROS (+)  Hematology negative hematology ROS (+)   Anesthesia Other Findings   Obstructive sleep apnea (adult) (pediatric) Myalgia and myositis, unspecified Type II or unspecified type diabetes mellitus without mention of complication, not stated as uncontrolled  Irritable bowel syndrome Unspecified hypothyroidism  Mixed hyperlipidemia Unspecified essential hypertension Esophageal reflux Herpes simplex without mention of complication Localized osteoarthrosis not specified whether primary or secondary, unspecified site Migraine, unspecified, without mention of intractable migraine without mention of status migrainosus Gouty arthropathy, unspecified Umbilical hernia without mention of obstruction or gangrene  Palpitations Asthma  Allergy Cataract   Depression Anxiety  Sleep apnea Mild cognitive impairment    Previous cataract surgery 01-18-23   Reproductive/Obstetrics negative OB  ROS                             Anesthesia Physical Anesthesia Plan  ASA: 3  Anesthesia Plan: MAC   Post-op Pain Management:    Induction: Intravenous  PONV Risk Score and Plan:   Airway Management Planned: Natural Airway and Nasal Cannula  Additional Equipment:   Intra-op Plan:   Post-operative Plan:   Informed Consent: I have reviewed the patients History and Physical, chart, labs and discussed the procedure including the risks, benefits and alternatives for the proposed anesthesia with the patient or authorized representative who has indicated his/her understanding and acceptance.     Dental Advisory Given  Plan Discussed with: Anesthesiologist, CRNA and Surgeon  Anesthesia Plan Comments: (Patient consented for risks of anesthesia including but not limited to:  - adverse reactions to medications - damage to eyes, teeth, lips or other oral mucosa - nerve damage due to positioning  - sore throat or hoarseness - Damage to heart, brain, nerves, lungs, other parts of body or loss of life  Patient voiced understanding and assent.)        Anesthesia Quick Evaluation

## 2023-01-24 LAB — HELIX MOLECULAR SCREEN: Genetic Analysis Overall Interpretation: NEGATIVE

## 2023-01-24 LAB — GENECONNECT MOLECULAR SCREEN

## 2023-01-26 ENCOUNTER — Telehealth (INDEPENDENT_AMBULATORY_CARE_PROVIDER_SITE_OTHER): Payer: Self-pay

## 2023-01-26 NOTE — Progress Notes (Signed)
    MRN : [id number]  Sarah Gordon is a 72 y.o. ([birth year]) female who presents with chief complaint of No chief complaint on file. .    The patient's right lower extremity was sterilely prepped and draped.  The ultrasound machine was used to visualize the right great saphenous vein throughout its course.  A segment just above the knee was selected for access.  The saphenous vein was accessed without difficulty using ultrasound guidance with a micropuncture needle.   An 0.018  wire was placed beyond the saphenofemoral junction through the sheath and the microneedle was removed.  The 65 cm sheath was then placed over the wire and the wire and dilator were removed.  The laser fiber was placed through the sheath and its tip was placed approximately 2 cm below the saphenofemoral junction.  Tumescent anesthesia was then created with a dilute lidocaine solution.  Laser energy was then delivered with constant withdrawal of the sheath and laser fiber.  Approximately 1143 Joules of energy were delivered over a length of 22 cm.  Sterile dressings were placed.  The patient tolerated the procedure well without complications.

## 2023-01-26 NOTE — Telephone Encounter (Signed)
Patient left a message requesting for Xanax 0.5mg  to sent to [facility] for laser ablation schedule for tomorrow. I left prescription on pharmacy voicemail and patient was notified.

## 2023-01-27 ENCOUNTER — Ambulatory Visit (INDEPENDENT_AMBULATORY_CARE_PROVIDER_SITE_OTHER): Payer: Medicare HMO | Admitting: Vascular Surgery

## 2023-01-27 ENCOUNTER — Encounter (INDEPENDENT_AMBULATORY_CARE_PROVIDER_SITE_OTHER): Payer: Self-pay | Admitting: Vascular Surgery

## 2023-01-27 VITALS — BP 126/78 | HR 73 | Resp 16 | Wt 279.8 lb

## 2023-01-27 DIAGNOSIS — I8311 Varicose veins of right lower extremity with inflammation: Secondary | ICD-10-CM | POA: Diagnosis not present

## 2023-01-27 DIAGNOSIS — I8310 Varicose veins of unspecified lower extremity with inflammation: Secondary | ICD-10-CM

## 2023-01-30 ENCOUNTER — Encounter (INDEPENDENT_AMBULATORY_CARE_PROVIDER_SITE_OTHER): Payer: Self-pay | Admitting: Vascular Surgery

## 2023-01-31 ENCOUNTER — Other Ambulatory Visit (INDEPENDENT_AMBULATORY_CARE_PROVIDER_SITE_OTHER): Payer: Self-pay | Admitting: Vascular Surgery

## 2023-01-31 DIAGNOSIS — I8310 Varicose veins of unspecified lower extremity with inflammation: Secondary | ICD-10-CM

## 2023-01-31 NOTE — Discharge Instructions (Signed)

## 2023-02-01 ENCOUNTER — Ambulatory Visit: Payer: Medicare HMO | Admitting: Anesthesiology

## 2023-02-01 ENCOUNTER — Other Ambulatory Visit: Payer: Self-pay

## 2023-02-01 ENCOUNTER — Encounter: Payer: Self-pay | Admitting: Ophthalmology

## 2023-02-01 ENCOUNTER — Encounter
Admission: RE | Disposition: A | Discharge status: Home / Self Care | Payer: Self-pay | Source: Home / Self Care | Attending: Ophthalmology

## 2023-02-01 ENCOUNTER — Ambulatory Visit
Admission: RE | Admit: 2023-02-01 | Discharge: 2023-02-01 | Disposition: A | Discharge status: Home / Self Care | Payer: Medicare HMO | Attending: Ophthalmology | Admitting: Ophthalmology

## 2023-02-01 DIAGNOSIS — I10 Essential (primary) hypertension: Secondary | ICD-10-CM | POA: Insufficient documentation

## 2023-02-01 DIAGNOSIS — G4733 Obstructive sleep apnea (adult) (pediatric): Secondary | ICD-10-CM | POA: Diagnosis not present

## 2023-02-01 DIAGNOSIS — E1136 Type 2 diabetes mellitus with diabetic cataract: Secondary | ICD-10-CM | POA: Diagnosis not present

## 2023-02-01 DIAGNOSIS — F419 Anxiety disorder, unspecified: Secondary | ICD-10-CM | POA: Diagnosis not present

## 2023-02-01 DIAGNOSIS — F32A Depression, unspecified: Secondary | ICD-10-CM | POA: Diagnosis not present

## 2023-02-01 DIAGNOSIS — J45909 Unspecified asthma, uncomplicated: Secondary | ICD-10-CM | POA: Insufficient documentation

## 2023-02-01 DIAGNOSIS — M797 Fibromyalgia: Secondary | ICD-10-CM | POA: Diagnosis not present

## 2023-02-01 DIAGNOSIS — K219 Gastro-esophageal reflux disease without esophagitis: Secondary | ICD-10-CM | POA: Diagnosis not present

## 2023-02-01 DIAGNOSIS — E039 Hypothyroidism, unspecified: Secondary | ICD-10-CM | POA: Insufficient documentation

## 2023-02-01 DIAGNOSIS — E1140 Type 2 diabetes mellitus with diabetic neuropathy, unspecified: Secondary | ICD-10-CM | POA: Diagnosis not present

## 2023-02-01 DIAGNOSIS — H2511 Age-related nuclear cataract, right eye: Secondary | ICD-10-CM | POA: Insufficient documentation

## 2023-02-01 HISTORY — PX: CATARACT EXTRACTION W/PHACO: SHX586

## 2023-02-01 SURGERY — PHACOEMULSIFICATION, CATARACT, WITH IOL INSERTION
Anesthesia: Monitor Anesthesia Care | Laterality: Right

## 2023-02-01 MED ORDER — MIDAZOLAM HCL 2 MG/2ML IJ SOLN
INTRAMUSCULAR | Status: DC | PRN
  Administered 2023-02-01: 2 mg via INTRAVENOUS

## 2023-02-01 MED ORDER — ARMC OPHTHALMIC DILATING DROPS
OPHTHALMIC | Status: AC
  Filled 2023-02-01: qty 0.5

## 2023-02-01 MED ORDER — SIGHTPATH DOSE#1 BSS IO SOLN
INTRAOCULAR | Status: DC | PRN
  Administered 2023-02-01: 2 mL

## 2023-02-01 MED ORDER — FENTANYL CITRATE (PF) 100 MCG/2ML IJ SOLN
INTRAMUSCULAR | Status: DC | PRN
  Administered 2023-02-01 (×2): 50 ug via INTRAVENOUS

## 2023-02-01 MED ORDER — MIDAZOLAM HCL 2 MG/2ML IJ SOLN
INTRAMUSCULAR | Status: AC
  Filled 2023-02-01: qty 2

## 2023-02-01 MED ORDER — TETRACAINE HCL 0.5 % OP SOLN
OPHTHALMIC | Status: AC
Start: 2023-02-01 — End: ?
  Filled 2023-02-01: qty 4

## 2023-02-01 MED ORDER — TETRACAINE HCL 0.5 % OP SOLN
1.0000 [drp] | OPHTHALMIC | Status: DC | PRN
  Administered 2023-02-01 (×3): 1 [drp] via OPHTHALMIC

## 2023-02-01 MED ORDER — SIGHTPATH DOSE#1 BSS IO SOLN
INTRAOCULAR | Status: DC | PRN
  Administered 2023-02-01: 15 mL via INTRAOCULAR

## 2023-02-01 MED ORDER — SIGHTPATH DOSE#1 NA CHONDROIT SULF-NA HYALURON 40-17 MG/ML IO SOLN
INTRAOCULAR | Status: DC | PRN
  Administered 2023-02-01: 1 mL via INTRAOCULAR

## 2023-02-01 MED ORDER — SIGHTPATH DOSE#1 BSS IO SOLN
INTRAOCULAR | Status: DC | PRN
  Administered 2023-02-01: 64 mL via OPHTHALMIC

## 2023-02-01 MED ORDER — BRIMONIDINE TARTRATE-TIMOLOL 0.2-0.5 % OP SOLN
OPHTHALMIC | Status: DC | PRN
  Administered 2023-02-01: 1 [drp] via OPHTHALMIC

## 2023-02-01 MED ORDER — FENTANYL CITRATE (PF) 100 MCG/2ML IJ SOLN
INTRAMUSCULAR | Status: AC
  Filled 2023-02-01: qty 2

## 2023-02-01 MED ORDER — ARMC OPHTHALMIC DILATING DROPS
1.0000 | OPHTHALMIC | Status: DC | PRN
  Administered 2023-02-01 (×3): 1 via OPHTHALMIC

## 2023-02-01 MED ORDER — MOXIFLOXACIN HCL 0.5 % OP SOLN
OPHTHALMIC | Status: DC | PRN
  Administered 2023-02-01: .2 mL via OPHTHALMIC

## 2023-02-01 SURGICAL SUPPLY — 12 items
ANGLE REVERSE CUT SHRT 25GA (CUTTER) ×1
CATARACT SUITE SIGHTPATH (MISCELLANEOUS) ×1
CYSTOTOME ANGL RVRS SHRT 25G (CUTTER) ×1 IMPLANT
FEE CATARACT SUITE SIGHTPATH (MISCELLANEOUS) ×1 IMPLANT
GLOVE BIOGEL PI IND STRL 8 (GLOVE) ×1 IMPLANT
GLOVE SURG LX STRL 8.0 MICRO (GLOVE) ×1 IMPLANT
LENS CLAREON VIVITY TORIC 18.5 ×1 IMPLANT
LENS CLRN VIVITY TORIC 3 18.5 ×1 IMPLANT
LENS IOL CLRN VT TRC 3 18.5 IMPLANT
NDL FILTER BLUNT 18X1 1/2 (NEEDLE) ×1 IMPLANT
NEEDLE FILTER BLUNT 18X1 1/2 (NEEDLE) ×1
SYR 3ML LL SCALE MARK (SYRINGE) ×1 IMPLANT

## 2023-02-01 NOTE — Anesthesia Postprocedure Evaluation (Signed)
Anesthesia Post Note  Patient: Sarah Gordon  Procedure(s) Performed: CATARACT EXTRACTION PHACO AND INTRAOCULAR LENS PLACEMENT (IOC) RIGHT DIABETIC  CLAREON VIVITY TORIC LENS 3.68 00:28.9 (Right)  Patient location during evaluation: PACU Anesthesia Type: MAC Level of consciousness: awake and alert Pain management: pain level controlled Vital Signs Assessment: post-procedure vital signs reviewed and stable Respiratory status: spontaneous breathing, nonlabored ventilation, respiratory function stable and patient connected to nasal cannula oxygen Cardiovascular status: stable and blood pressure returned to baseline Postop Assessment: no apparent nausea or vomiting Anesthetic complications: no   No notable events documented.   Last Vitals:  Vitals:   02/01/23 0746 02/01/23 0752  BP: 121/88   Pulse: 82   Resp: 20   Temp: 36.6 C   SpO2: 96% 95%    Last Pain:  Vitals:   02/01/23 0752  TempSrc:   PainSc: 0-No pain                 Marisue Humble

## 2023-02-01 NOTE — Op Note (Signed)
PREOPERATIVE DIAGNOSIS:  Nuclear sclerotic cataract of the right eye.   POSTOPERATIVE DIAGNOSIS:  Nuclear sclerotic cataract of the right eye.   OPERATIVE PROCEDURE: Procedure(s): CATARACT EXTRACTION PHACO AND INTRAOCULAR LENS PLACEMENT (IOC) RIGHT DIABETIC  CLAREON VIVITY TORIC LENS 3.68 00:28.9   SURGEON:  Sarah Manila, MD.   ANESTHESIA: 1.      Managed anesthesia care. 2.     0.34ml of Shugarcaine was instilled following the paracentesis  Anesthesiologist: Marisue Humble, MD CRNA: Barbette Hair, CRNA  COMPLICATIONS:  None.   TECHNIQUE:   Stop and chop    DESCRIPTION OF PROCEDURE:  The patient was examined and consented in the preoperative holding area where the aforementioned topical anesthesia was applied to the right eye.  The patient was brought back to the Operating Room where he was sat upright on the gurney and given a target to fixate upon while the eye was marked at the 3:00 and 9:00 position.  The patient was then reclined on the operating table.  The eye was prepped and draped in the usual sterile ophthalmic fashion and a lid speculum was placed. A paracentesis was created with the side port blade and the anterior chamber was filled with viscoelastic. A near clear corneal incision was performed with the steel keratome. A continuous curvilinear capsulorrhexis was performed with a cystotome followed by the capsulorrhexis forceps. Hydrodissection and hydrodelineation were carried out with BSS on a blunt cannula. The lens was removed in a stop and chop technique and the remaining cortical material was removed with the irrigation-aspiration handpiece. The eye was inflated with viscoelastic and the ZCT  lens  was placed in the eye and rotated to within a few degrees of the predetermined orientation.  The remaining viscoelastic was removed from the eye.  The Sinskey hook was used to rotate the toric lens into its final resting place at 030 degrees.  0. The eye was inflated to a  physiologic pressure and found to be watertight. 0.62ml of Vigamox was placed in the anterior chamber.  The eye was dressed with Vigamox and Combigan. . The patient was given protective glasses to wear throughout the day and a shield with which to sleep tonight. The patient was also given drops with which to begin a drop regimen today and will follow-up with me in one day. Implant Name Type Inv. Item Serial No. Manufacturer Lot No. LRB No. Used Action  LENS CLAREON VIVITY TORIC 18.5 - [id number]  LENS CLAREON VIVITY TORIC 18.5 [id number] [employer]  Right 1 Implanted   Procedure(s): CATARACT EXTRACTION PHACO AND INTRAOCULAR LENS PLACEMENT (IOC) RIGHT DIABETIC  CLAREON VIVITY TORIC LENS 3.68 00:28.9 (Right)  Electronically signed: Galen Gordon 02/01/2023 7:46 AM

## 2023-02-01 NOTE — H&P (Signed)
[facility]   Primary Care Physician:  Alba Cory, MD Ophthalmologist: Dr. Druscilla Brownie  Pre-Procedure History & Physical: HPI:  AMBERLI SALT is a 72 y.o. female here for cataract surgery.   Past Medical History:  Diagnosis Date   Allergy    Anxiety '95   Asthma    Cataract    Depression '95   Esophageal reflux    Gouty arthropathy, unspecified    Herpes simplex without mention of complication    Irritable bowel syndrome    Localized osteoarthrosis not specified whether primary or secondary, unspecified site    Migraine, unspecified, without mention of intractable migraine without mention of status migrainosus    Mild cognitive impairment    Mixed hyperlipidemia    Myalgia and myositis, unspecified    Obstructive sleep apnea (adult) (pediatric)    Palpitations    Sleep apnea    No CPAP   Type II or unspecified type diabetes mellitus without mention of complication, not stated as uncontrolled    Diet controlled   Umbilical hernia without mention of obstruction or gangrene    Unspecified essential hypertension    Unspecified hypothyroidism     Past Surgical History:  Procedure Laterality Date   BREAST BIOPSY Left    benign   CATARACT EXTRACTION W/PHACO Left 01/18/2023   Procedure: CATARACT EXTRACTION PHACO AND INTRAOCULAR LENS PLACEMENT (IOC) LEFT DIABETIC  CLAREON VIVITY TORIC LENS;  Surgeon: Galen Manila, MD;  Location: [facility];  Service: Ophthalmology;  Laterality: Left;  4.47 0:31.9   COLONOSCOPY     Dr Thurmond Butts   COLONOSCOPY WITH PROPOFOL N/A 11/19/2015   Procedure: COLONOSCOPY WITH PROPOFOL;  Surgeon: Kieth Brightly, MD;  Location: [facility] ENDOSCOPY;  Service: Endoscopy;  Laterality: N/A;   COLONOSCOPY WITH PROPOFOL N/A 06/18/2021   Procedure: COLONOSCOPY WITH PROPOFOL;  Surgeon: Toney Reil, MD;  Location: [facility] ENDOSCOPY;  Service: Gastroenterology;  Laterality: N/A;   ESOPHAGOGASTRODUODENOSCOPY N/A 08/20/2014   Procedure:  ESOPHAGOGASTRODUODENOSCOPY (EGD);  Surgeon: Kieth Brightly, MD;  Location: [facility] ENDOSCOPY;  Service: Endoscopy;  Laterality: N/A;   ESOPHAGOGASTRODUODENOSCOPY (EGD) WITH PROPOFOL N/A 06/18/2021   Procedure: ESOPHAGOGASTRODUODENOSCOPY (EGD) WITH PROPOFOL;  Surgeon: Toney Reil, MD;  Location: [facility] ENDOSCOPY;  Service: Gastroenterology;  Laterality: N/A;   FOOT SURGERY Left    heel spur   HERNIA REPAIR     umbilical   NASAL SINUS SURGERY  1980s   UMBILICAL HERNIA REPAIR      Prior to Admission medications   Medication Sig Start Date End Date Taking? Authorizing Provider  acetaminophen (TYLENOL) 650 MG CR tablet Take 1,300 mg by mouth 2 (two) times daily.   Yes [provider]  albuterol (VENTOLIN HFA) 108 (90 Base) MCG/ACT inhaler Inhale 2 puffs into the lungs every 6 (six) hours as needed for wheezing or shortness of breath. 05/23/20  Yes Sowles, Danna Hefty, MD  ALPRAZolam Prudy Feeler) 0.5 MG tablet Take 1 tab one hour before procedure and 1 tab when you arrive in office 01/21/23  Yes Georgiana Spinner, NP  baclofen (LIORESAL) 10 MG tablet Take 10 mg by mouth 3 (three) times daily as needed for muscle spasms.   Yes [provider]  buPROPion (WELLBUTRIN XL) 150 MG 24 hr tablet Take 1 tablet (150 mg total) by mouth in the morning. 01/04/23  Yes Sowles, Danna Hefty, MD  DULoxetine (CYMBALTA) 60 MG capsule Take 1 capsule (60 mg total) by mouth daily. 01/04/23  Yes Sowles, Danna Hefty, MD  ezetimibe (ZETIA) 10 MG tablet  Take 1 tablet (10 mg total) by mouth daily. 01/04/23  Yes Sowles, Danna Hefty, MD  folic acid (FOLVITE) 1 MG tablet Take 1 tablet (1 mg total) by mouth daily. 01/04/23  Yes Sowles, Danna Hefty, MD  hydrOXYzine (ATARAX) 10 MG tablet Take 1 tablet (10 mg total) by mouth 3 (three) times daily as needed. 01/21/22  Yes Sowles, Danna Hefty, MD  levothyroxine (SYNTHROID) 100 MCG tablet TAKE 1 TABLET BY MOUTH DAILY 01/04/23  Yes Sowles, Danna Hefty, MD  metoprolol succinate (TOPROL-XL) 25 MG 24  hr tablet Take 1 tablet (25 mg total) by mouth daily. 01/04/23  Yes Sowles, Danna Hefty, MD  montelukast (SINGULAIR) 10 MG tablet Take 1 tablet (10 mg total) by mouth at bedtime. 01/04/23  Yes Sowles, Danna Hefty, MD  Multiple Vitamin (MULTIVITAMIN) tablet Take 1 tablet by mouth daily.   Yes [provider]  olmesartan-hydrochlorothiazide (BENICAR HCT) 40-12.5 MG tablet Take 1 tablet by mouth daily. 01/04/23  Yes Sowles, Danna Hefty, MD  omeprazole (PRILOSEC) 20 MG capsule Take 1 capsule (20 mg total) by mouth 2 (two) times daily before a meal. 01/04/23  Yes Sowles, Danna Hefty, MD  QUEtiapine (SEROQUEL) 25 MG tablet Take 1 tablet (25 mg total) by mouth at bedtime. 01/04/23  Yes Sowles, Danna Hefty, MD  rOPINIRole (REQUIP) 0.5 MG tablet Take 1 tablet (0.5 mg total) by mouth at bedtime. 01/04/23  Yes Sowles, Danna Hefty, MD  tiZANidine (ZANAFLEX) 4 MG tablet Take 1 tablet (4 mg total) by mouth at bedtime as needed for muscle spasms. 01/04/23  Yes Sowles, Danna Hefty, MD  ALPRAZolam Prudy Feeler) 0.5 MG tablet Take 1 tab one hour before procedure and 1 tab when you arrive in office 02/21/23   Georgiana Spinner, NP  Evolocumab (REPATHA) 140 MG/ML SOSY Inject 140 mg into the skin every 14 (fourteen) days. Patient not taking: Reported on 01/17/2023 09/01/22   Alba Cory, MD  Fluticasone-Salmeterol (ADVAIR DISKUS) 250-50 MCG/DOSE AEPB Inhale 1 puff into the lungs 2 (two) times daily. 04/11/19 07/22/19  Alba Cory, MD    Allergies as of 01/04/2023 - Review Complete 01/04/2023  Allergen Reaction Noted   Vascepa [icosapent ethyl] Hives and Swelling 07/22/2014   Aspir-81 [aspirin] Other (See Comments) 07/22/2014   Gabapentin Other (See Comments) 05/28/2021   Metformin and related  09/01/2022   Mounjaro [tirzepatide] Diarrhea 06/21/2022   Statins Other (See Comments) 09/01/2015   Topamax [topiramate] Other (See Comments) 12/28/2012   Voltaren [diclofenac sodium] Hives and Other (See Comments) 12/28/2012   Lipitor  [atorvastatin]  02/04/2015    Family History  Problem Relation Age of Onset   Hypertension Mother    Hyperlipidemia Mother    Diabetes Mother    Heart disease Mother    Stroke Mother    Varicose Veins Mother    Alcohol abuse Father    Lupus Sister    Arthritis Sister    Depression Sister    Obesity Sister    Asthma Sister    Depression Sister    Hypertension Sister    Miscarriages / Stillbirths Sister    Varicose Veins Sister    Depression Sister    Arthritis Sister    Heart disease Sister    Hyperlipidemia Sister    Hypertension Sister    Miscarriages / [place] Sister    Varicose Veins Sister    Miscarriages / Stillbirths Sister    Arthritis Sister    Depression Sister    Heart disease Sister    Hyperlipidemia Sister    Hypertension Sister    Heart disease Brother  55       CABG x 3    Heart attack Brother 55   Hyperlipidemia Brother    Hypertension Brother    Diabetes Brother    Hypertension Brother    Obesity Brother    Asthma Brother    Depression Brother    Hyperlipidemia Brother    Alcohol abuse Brother    Heart disease Brother    Stroke Brother    Varicose Veins Brother    Early death Brother    Stroke Brother    Arthritis Brother    Depression Brother    Hyperlipidemia Brother    Hypertension Brother    Stroke Maternal Grandfather    Breast cancer Neg Hx     Social History   Socioeconomic History   Marital status: Married    Spouse name: Harrell Gave    Number of children: 1   Years of education: Not on file   Highest education level: Some college, no degree  Occupational History    Comment: works part time at [employer]  Tobacco Use   Smoking status: Never   Smokeless tobacco: Never  Vaping Use   Vaping status: Never Used  Substance and Sexual Activity   Alcohol use: No   Drug use: No   Sexual activity: Not Currently    Birth control/protection: Post-menopausal  Other Topics Concern   Not on file  Social History Narrative   Not on  file   Social Determinants of Health   Financial Resource Strain: Low Risk  (01/03/2023)   Overall Financial Resource Strain (CARDIA)    Difficulty of Paying Living Expenses: Not very hard  Food Insecurity: No Food Insecurity (01/03/2023)   Hunger Vital Sign    Worried About Running Out of Food in the Last Year: Never true    Ran Out of Food in the Last Year: Never true  Transportation Needs: No Transportation Needs (01/03/2023)   PRAPARE - Administrator, Civil Service (Medical): No    Lack of Transportation (Non-Medical): No  Physical Activity: Insufficiently Active (01/03/2023)   Exercise Vital Sign    Days of Exercise per Week: 1 day    Minutes of Exercise per Session: 10 min  Stress: Stress Concern Present (01/03/2023)   [employer] of Occupational Health - Occupational Stress Questionnaire    Feeling of Stress : Rather much  Social Connections: Socially Integrated (01/03/2023)   Social Connection and Isolation Panel [NHANES]    Frequency of Communication with Friends and Family: More than three times a week    Frequency of Social Gatherings with Friends and Family: Three times a week    Attends Religious Services: More than 4 times per year    Active Member of Clubs or Organizations: No    Attends Banker Meetings: More than 4 times per year    Marital Status: Married  Catering manager Violence: Not At Risk (06/04/2022)   Humiliation, Afraid, Rape, and Kick questionnaire    Fear of Current or Ex-Partner: No    Emotionally Abused: No    Physically Abused: No    Sexually Abused: No    Review of Systems: See HPI, otherwise negative ROS  Physical Exam: BP 122/78   Pulse 82   Temp (!) 97.1 F (36.2 C) (Temporal)   Resp 12   Ht 5\' 6"  (1.676 m)   Wt 125.6 kg   SpO2 96%   BMI 44.71 kg/m  General:   Alert, cooperative in NAD Head:  Normocephalic and atraumatic. Respiratory:  Normal work of breathing. Cardiovascular:   RRR  Impression/Plan: SENIYA GOUKER is here for cataract surgery.  Risks, benefits, limitations, and alternatives regarding cataract surgery have been reviewed with the patient.  Questions have been answered.  All parties agreeable.   Galen Manila, MD  02/01/2023, 7:15 AM

## 2023-02-01 NOTE — Transfer of Care (Signed)
Immediate Anesthesia Transfer of Care Note  Patient: Sarah Gordon  Procedure(s) Performed: CATARACT EXTRACTION PHACO AND INTRAOCULAR LENS PLACEMENT (IOC) RIGHT DIABETIC  CLAREON VIVITY TORIC LENS 3.68 00:28.9 (Right)  Patient Location: PACU  Anesthesia Type: MAC  Level of Consciousness: awake, alert  and patient cooperative  Airway and Oxygen Therapy: Patient Spontanous Breathing and Patient connected to supplemental oxygen  Post-op Assessment: Post-op Vital signs reviewed, Patient's Cardiovascular Status Stable, Respiratory Function Stable, Patent Airway and No signs of Nausea or vomiting  Post-op Vital Signs: Reviewed and stable  Complications: No notable events documented.

## 2023-02-03 ENCOUNTER — Ambulatory Visit (INDEPENDENT_AMBULATORY_CARE_PROVIDER_SITE_OTHER): Payer: Medicare HMO

## 2023-02-03 ENCOUNTER — Encounter: Payer: Self-pay | Admitting: Ophthalmology

## 2023-02-03 DIAGNOSIS — I8310 Varicose veins of unspecified lower extremity with inflammation: Secondary | ICD-10-CM

## 2023-02-03 DIAGNOSIS — I8311 Varicose veins of right lower extremity with inflammation: Secondary | ICD-10-CM

## 2023-02-04 ENCOUNTER — Encounter (INDEPENDENT_AMBULATORY_CARE_PROVIDER_SITE_OTHER): Payer: Self-pay | Admitting: Vascular Surgery

## 2023-02-09 ENCOUNTER — Telehealth (INDEPENDENT_AMBULATORY_CARE_PROVIDER_SITE_OTHER): Payer: Self-pay

## 2023-02-09 NOTE — Telephone Encounter (Signed)
Patient left a message concern with the veins being more raised and tender to the touch since laser. Patient had a right laser ablation on 01/27/23. Patient was advise that soreness and pain is to expected after procedure. Also she should take tylenol or ibuprofen 600 to 800 mg for pain relief.Please Advise

## 2023-02-09 NOTE — Telephone Encounter (Signed)
It is completely normal and expected for it to be raised post procedure.  The vein is angry and inflammed and this can last through to her follow up (about 4 weeks).  She should continue to use compression with ibuprofen and elevation as necessary

## 2023-02-09 NOTE — Telephone Encounter (Signed)
Patient notified with medical recommendations and verbalized understanding 

## 2023-02-20 ENCOUNTER — Other Ambulatory Visit: Payer: Self-pay | Admitting: Family Medicine

## 2023-02-20 DIAGNOSIS — E039 Hypothyroidism, unspecified: Secondary | ICD-10-CM

## 2023-02-20 DIAGNOSIS — I10 Essential (primary) hypertension: Secondary | ICD-10-CM

## 2023-02-20 DIAGNOSIS — R252 Cramp and spasm: Secondary | ICD-10-CM

## 2023-02-23 ENCOUNTER — Telehealth (INDEPENDENT_AMBULATORY_CARE_PROVIDER_SITE_OTHER): Payer: Self-pay

## 2023-02-23 NOTE — Telephone Encounter (Signed)
She can stop and we will see how it is when she follows up on Monday with GS

## 2023-02-23 NOTE — Telephone Encounter (Signed)
Message given

## 2023-02-23 NOTE — Telephone Encounter (Signed)
Sarah Gordon called requesting to stop wearing the compressions stockings now. She says she has severe pain from the side of her knee radiating down to her heal.   Patient had a laser done with Dr.Schneir on 01/27/23  See previous phone encounters

## 2023-02-28 ENCOUNTER — Encounter (INDEPENDENT_AMBULATORY_CARE_PROVIDER_SITE_OTHER): Payer: Self-pay | Admitting: Vascular Surgery

## 2023-02-28 ENCOUNTER — Ambulatory Visit (INDEPENDENT_AMBULATORY_CARE_PROVIDER_SITE_OTHER): Payer: Medicare HMO | Admitting: Vascular Surgery

## 2023-02-28 VITALS — BP 124/80 | HR 73 | Resp 18 | Ht 66.0 in | Wt 277.0 lb

## 2023-02-28 DIAGNOSIS — I8312 Varicose veins of left lower extremity with inflammation: Secondary | ICD-10-CM | POA: Diagnosis not present

## 2023-02-28 DIAGNOSIS — I8310 Varicose veins of unspecified lower extremity with inflammation: Secondary | ICD-10-CM

## 2023-02-28 NOTE — Progress Notes (Signed)
    MRN : [id number]  Sarah Gordon is a 72 y.o. (Nov 22, 1950) female who presents with chief complaint of No chief complaint on file. .    The patient's left lower extremity was sterilely prepped and draped.  The ultrasound machine was used to visualize the left great saphenous vein throughout its course.  A segment below the knee was selected for access.  The saphenous vein was accessed without difficulty using ultrasound guidance with a micropuncture needle.   An 0.018  wire was placed beyond the saphenofemoral junction through the sheath and the microneedle was removed.  The 65 cm sheath was then placed over the wire and the wire and dilator were removed.  The laser fiber was placed through the sheath and its tip was placed approximately 2 cm below the saphenofemoral junction.  Tumescent anesthesia was then created with a dilute lidocaine solution.  Laser energy was then delivered with constant withdrawal of the sheath and laser fiber.  Approximately 1992 Joules of energy were delivered over a length of 41 cm.  Sterile dressings were placed.  The patient tolerated the procedure well without complications.

## 2023-03-07 ENCOUNTER — Other Ambulatory Visit (INDEPENDENT_AMBULATORY_CARE_PROVIDER_SITE_OTHER): Payer: Self-pay | Admitting: Vascular Surgery

## 2023-03-07 ENCOUNTER — Ambulatory Visit (INDEPENDENT_AMBULATORY_CARE_PROVIDER_SITE_OTHER): Payer: Medicare HMO

## 2023-03-07 DIAGNOSIS — I8310 Varicose veins of unspecified lower extremity with inflammation: Secondary | ICD-10-CM

## 2023-03-07 DIAGNOSIS — I8312 Varicose veins of left lower extremity with inflammation: Secondary | ICD-10-CM

## 2023-03-25 ENCOUNTER — Ambulatory Visit (INDEPENDENT_AMBULATORY_CARE_PROVIDER_SITE_OTHER): Payer: Medicare HMO | Admitting: Physician Assistant

## 2023-03-25 ENCOUNTER — Encounter: Payer: Self-pay | Admitting: Physician Assistant

## 2023-03-25 VITALS — BP 118/70 | HR 95 | Temp 97.9°F | Resp 16 | Ht 65.0 in | Wt 279.0 lb

## 2023-03-25 DIAGNOSIS — J069 Acute upper respiratory infection, unspecified: Secondary | ICD-10-CM

## 2023-03-25 DIAGNOSIS — J4521 Mild intermittent asthma with (acute) exacerbation: Secondary | ICD-10-CM

## 2023-03-25 MED ORDER — METHYLPREDNISOLONE 4 MG PO TBPK: ORAL_TABLET | ORAL | 0 refills | Status: DC

## 2023-03-25 MED ORDER — BENZONATATE 100 MG PO CAPS: 100.0000 mg | ORAL_CAPSULE | Freq: Three times a day (TID) | ORAL | 0 refills | Status: DC | PRN

## 2023-03-25 NOTE — Progress Notes (Signed)
 Acute Office Visit   Patient: Sarah Gordon   DOB: 11-10-1950   73 y.o. Female  MRN: [id number] Visit Date: 03/25/2023  Today's healthcare provider: Rocky BRAVO Kayleeann Huxford, PA-C  Introduced myself to the patient as a PA-C and provided education on APPs in clinical practice.    Chief Complaint  Patient presents with   Cough    Productive, x4 days. Took OTC NyQuil/DayQuil   Nasal Congestion   Subjective    HPI HPI     Cough    Additional comments: Productive, x4 days. Took OTC NyQuil/DayQuil      Last edited by Dann Kirsch, CMA on 03/25/2023  2:47 PM.        Onset Duration: started about 5 days ago  Associated symptoms: reports she has been having productive coughing, mild SOB and wheezing  She thinks she is getting a bit better   Intervention: she has been taking Dayquil and Nyquil   She has not used her rescue inhaler (she has hx of mild, intermittent asthma)   Recent sick contacts: She denies known sick contacts  Recent travel: she went to [facility] over the holidays and stayed with extended family  COVID testing at home: she has not tested   Result: NA   Medications: Outpatient Medications Prior to Visit  Medication Sig   acetaminophen  (TYLENOL ) 650 MG CR tablet Take 1,300 mg by mouth 2 (two) times daily.   albuterol  (VENTOLIN  HFA) 108 (90 Base) MCG/ACT inhaler Inhale 2 puffs into the lungs every 6 (six) hours as needed for wheezing or shortness of breath.   baclofen  (LIORESAL ) 10 MG tablet Take 10 mg by mouth 3 (three) times daily as needed for muscle spasms.   buPROPion  (WELLBUTRIN  XL) 150 MG 24 hr tablet Take 1 tablet (150 mg total) by mouth in the morning.   DULoxetine  (CYMBALTA ) 60 MG capsule Take 1 capsule (60 mg total) by mouth daily.   Evolocumab  (REPATHA ) 140 MG/ML SOSY Inject 140 mg into the skin every 14 (fourteen) days.   ezetimibe  (ZETIA ) 10 MG tablet Take 1 tablet (10 mg total) by mouth daily.   folic acid  (FOLVITE ) 1 MG tablet Take 1  tablet (1 mg total) by mouth daily.   hydrOXYzine  (ATARAX ) 10 MG tablet Take 1 tablet (10 mg total) by mouth 3 (three) times daily as needed.   levothyroxine  (SYNTHROID ) 100 MCG tablet TAKE 1 TABLET BY MOUTH DAILY   metoprolol  succinate (TOPROL -XL) 25 MG 24 hr tablet Take 1 tablet (25 mg total) by mouth daily.   montelukast  (SINGULAIR ) 10 MG tablet Take 1 tablet (10 mg total) by mouth at bedtime.   Multiple Vitamin (MULTIVITAMIN) tablet Take 1 tablet by mouth daily.   olmesartan -hydrochlorothiazide  (BENICAR  HCT) 40-12.5 MG tablet TAKE 1 TABLET BY MOUTH DAILY   omeprazole  (PRILOSEC) 20 MG capsule Take 1 capsule (20 mg total) by mouth 2 (two) times daily before a meal.   QUEtiapine  (SEROQUEL ) 25 MG tablet Take 1 tablet (25 mg total) by mouth at bedtime.   rOPINIRole  (REQUIP ) 0.5 MG tablet TAKE 1 TABLET BY MOUTH AT BEDTIME   tiZANidine  (ZANAFLEX ) 4 MG tablet Take 1 tablet (4 mg total) by mouth at bedtime as needed for muscle spasms.   ALPRAZolam  (XANAX ) 0.5 MG tablet Take 1 tab one hour before procedure and 1 tab when you arrive in office   ALPRAZolam  (XANAX ) 0.5 MG tablet Take 1 tab one hour before procedure and 1 tab when  you arrive in office   No facility-administered medications prior to visit.    Review of Systems  Constitutional:  Negative for chills and fever.  HENT:  Positive for congestion and rhinorrhea. Negative for ear pain, postnasal drip, sinus pressure, sinus pain and sore throat.   Respiratory:  Positive for cough. Negative for chest tightness, shortness of breath and wheezing.   Gastrointestinal:  Negative for diarrhea, nausea and vomiting.  Musculoskeletal:  Negative for myalgias.  Neurological:  Positive for headaches. Negative for dizziness and light-headedness.        Objective    BP 118/70   Pulse 95   Temp 97.9 F (36.6 C) (Oral)   Resp 16   Ht 5' 5 (1.651 m)   Wt 279 lb (126.6 kg)   SpO2 98%   BMI 46.43 kg/m     Physical Exam Vitals reviewed.   Constitutional:      General: She is awake.     Appearance: Normal appearance. She is well-developed and well-groomed.  HENT:     Head: Normocephalic and atraumatic.     Right Ear: Hearing, tympanic membrane and ear canal normal.     Left Ear: Hearing and ear canal normal. Tympanic membrane is erythematous.     Mouth/Throat:     Lips: Pink.     Mouth: Mucous membranes are moist.     Pharynx: Oropharynx is clear. Uvula midline. No pharyngeal swelling, oropharyngeal exudate, posterior oropharyngeal erythema, uvula swelling or postnasal drip.  Neck:     Comments: Tenderness along submandibular lymph nodes  Cardiovascular:     Rate and Rhythm: Normal rate and regular rhythm.     Heart sounds: Normal heart sounds. Heart sounds not distant.     No friction rub. No gallop.  Pulmonary:     Effort: Pulmonary effort is normal.     Breath sounds: Normal breath sounds. No decreased air movement. No decreased breath sounds, wheezing, rhonchi or rales.  Musculoskeletal:     Cervical back: Normal range of motion and neck supple. Normal range of motion.     Right lower leg: No edema.     Left lower leg: No edema.  Lymphadenopathy:     Head:     Right side of head: No submental, submandibular or preauricular adenopathy.     Left side of head: No submental, submandibular or preauricular adenopathy.     Cervical:     Right cervical: No superficial cervical adenopathy.    Left cervical: No superficial cervical adenopathy.     Upper Body:     Right upper body: No supraclavicular adenopathy.     Left upper body: No supraclavicular adenopathy.  Neurological:     General: No focal deficit present.     Mental Status: She is alert and oriented to person, place, and time.     GCS: GCS eye subscore is 4. GCS verbal subscore is 5. GCS motor subscore is 6.  Psychiatric:        Behavior: Behavior is cooperative.       No results found for any visits on 03/25/23.  Assessment & Plan      No  follow-ups on file.      Problem List Items Addressed This Visit       Respiratory   Asthma, intermittent   Relevant Medications   benzonatate  (TESSALON  PERLES) 100 MG capsule   methylPREDNISolone  (MEDROL  DOSEPAK) 4 MG TBPK tablet   Other Visit Diagnoses       Viral  upper respiratory tract infection    -  Primary   Relevant Medications   benzonatate  (TESSALON  PERLES) 100 MG capsule   methylPREDNISolone  (MEDROL  DOSEPAK) 4 MG TBPK tablet     Visit with patient indicates symptoms comprised of congestion, rhinorrhea, productive coughing, SOB for the past 5 days congruent with acute URI that is likely viral in nature  Patient is outside therapeutic window for antivirals- no flu or COVID testing performed today.  Due to nature and duration of symptoms recommended treatment regimen is symptomatic relief and follow up if needed Given previous hx of asthma- will send in Prednisone  taper to assist with SOB and coughing along with Tessalon  pearls  Discussed with patient the various viral and bacterial etiologies of current illness and appropriate course of treatment Discussed OTC medication options for multisymptom relief such as Dayquil/Nyquil, Theraflu, AlkaSeltzer, etc. Discussed return precautions if symptoms are not improving or worsen over next 5-7 days.     No follow-ups on file.   I, Lajeana Strough E Natalie Leclaire, PA-C, have reviewed all documentation for this visit. The documentation on 03/25/23 for the exam, diagnosis, procedures, and orders are all accurate and complete.   [place], MHS, PA-C [facility]

## 2023-03-25 NOTE — Patient Instructions (Signed)
 Based on your described symptoms and the duration of symptoms it is likely that you have a viral upper respiratory infection (often called a cold)  Symptoms can last for 3-10 days with lingering cough and intermittent symptoms lasting weeks after that.  The goal of treatment at this time is to reduce your symptoms and discomfort   I recommend using Robitussin and Mucinex (regular formulations, nothing with decongestants or DM)  You can also use Tylenol  for body aches and fever reduction  I have sent in a script for a Medrol  steroid  taper to be taken in the morning with breakfast per the instructions on the container Remember that steroids can cause sleeplessness, irritability, increased hunger and elevated glucose levels so be mindful of these side effects. They should lessen as you progress to the lower doses of the taper.  I also recommend adding an antihistamine to your daily regimen This includes medications like Claritin, Allegra, Zyrtec- the generics of these work very well and are usually less expensive I recommend using Flonase  nasal spray - 2 puffs twice per day to help with your nasal congestion The antihistamines and Flonase  can take a few weeks to provide significant relief from allergy symptoms but should start to provide some benefit soon. You can use a humidifier at night to help with preventing nasal dryness and irritation   If your symptoms do not improve or become worse in the next 5-7 days please make an apt at the office so we can see you  Go to the ER if you begin to have more serious symptoms such as shortness of breath, trouble breathing, loss of consciousness, swelling around the eyes, high fever, severe lasting headaches, vision changes or neck pain/stiffness.

## 2023-03-27 NOTE — Progress Notes (Addendum)
 MRN : [id number]  Sarah Gordon is a 73 y.o. (30-Mar-1950) female who presents with chief complaint of varicose veins hurt.  History of Present Illness:   The patient returns to the office for followup status post laser ablation of the right great saphenous vein on 01/27/2023 and the left great saphenous vein on 02/28/2023. The patient notes multiple residual varicosities bilaterally which continued to hurt with dependent positions and remained tender to palpation. The patient's swelling is unchanged from preoperative status. The patient continues to wear graduated compression stockings on a daily basis but these are not eliminating the pain and discomfort. The patient continues to use over-the-counter anti-inflammatory medications to treat the pain and related symptoms but this has not given the patient relief. The patient notes the pain in the lower extremities is causing problems with daily exercise, problems at work and even with household activities such as preparing meals and doing dishes.  The patient is otherwise done well and there have been no complications related to the laser procedure or interval changes in the patient's overall   Venous ultrasound post laser shows successful laser ablation of the both great saphenous veins, no DVT identified.  No outpatient medications have been marked as taking for the 03/28/23 encounter (Appointment) with Jama, Cordella MATSU, MD.    Past Medical History:  Diagnosis Date   Allergy    Anxiety '95   Asthma    Cataract    Depression '95   Esophageal reflux    Gouty arthropathy, unspecified    Herpes simplex without mention of complication    Irritable bowel syndrome    Localized osteoarthrosis not specified whether primary or secondary, unspecified site    Migraine, unspecified, without mention of intractable migraine without mention of status migrainosus    Mild cognitive impairment    Mixed hyperlipidemia    Myalgia and  myositis, unspecified    Obstructive sleep apnea (adult) (pediatric)    Palpitations    Sleep apnea    No CPAP   Type II or unspecified type diabetes mellitus without mention of complication, not stated as uncontrolled    Diet controlled   Umbilical hernia without mention of obstruction or gangrene    Unspecified essential hypertension    Unspecified hypothyroidism     Past Surgical History:  Procedure Laterality Date   BREAST BIOPSY Left    benign   CATARACT EXTRACTION W/PHACO Left 01/18/2023   Procedure: CATARACT EXTRACTION PHACO AND INTRAOCULAR LENS PLACEMENT (IOC) LEFT DIABETIC  CLAREON VIVITY TORIC LENS;  Surgeon: Jaye Fallow, MD;  Location: [facility];  Service: Ophthalmology;  Laterality: Left;  4.47 0:31.9   CATARACT EXTRACTION W/PHACO Right 02/01/2023   Procedure: CATARACT EXTRACTION PHACO AND INTRAOCULAR LENS PLACEMENT (IOC) RIGHT DIABETIC  CLAREON VIVITY TORIC LENS 3.68 00:28.9;  Surgeon: Jaye Fallow, MD;  Location: [facility];  Service: Ophthalmology;  Laterality: Right;   COLONOSCOPY     Dr Desiderio   COLONOSCOPY WITH PROPOFOL  N/A 11/19/2015   Procedure: COLONOSCOPY WITH PROPOFOL ;  Surgeon: Louanne MATSU Muse, MD;  Location: [facility];  Service: Endoscopy;  Laterality: N/A;   COLONOSCOPY WITH PROPOFOL  N/A 06/18/2021   Procedure: COLONOSCOPY WITH PROPOFOL ;  Surgeon: Unk Corinn Skiff, MD;  Location: [facility];  Service: Gastroenterology;  Laterality: N/A;   ESOPHAGOGASTRODUODENOSCOPY N/A 08/20/2014   Procedure: ESOPHAGOGASTRODUODENOSCOPY (EGD);  Surgeon: Louanne MATSU Muse, MD;  Location: [facility]COPY;  Service: Endoscopy;  Laterality:  N/A;   ESOPHAGOGASTRODUODENOSCOPY (EGD) WITH PROPOFOL  N/A 06/18/2021   Procedure: ESOPHAGOGASTRODUODENOSCOPY (EGD) WITH PROPOFOL ;  Surgeon: Unk Corinn Skiff, MD;  Location: [facility];  Service: Gastroenterology;  Laterality: N/A;   FOOT SURGERY Left    heel spur   HERNIA REPAIR      umbilical   NASAL SINUS SURGERY  1980s   UMBILICAL HERNIA REPAIR      Social History Social History   Tobacco Use   Smoking status: Never   Smokeless tobacco: Never  Vaping Use   Vaping status: Never Used  Substance Use Topics   Alcohol use: No   Drug use: No    Family History Family History  Problem Relation Age of Onset   Hypertension Mother    Hyperlipidemia Mother    Diabetes Mother    Heart disease Mother    Stroke Mother    Varicose Veins Mother    Alcohol abuse Father    Lupus Sister    Arthritis Sister    Depression Sister    Obesity Sister    Asthma Sister    Depression Sister    Hypertension Sister    Miscarriages / Stillbirths Sister    Varicose Veins Sister    Depression Sister    Arthritis Sister    Heart disease Sister    Hyperlipidemia Sister    Hypertension Sister    Miscarriages / Stillbirths Sister    Varicose Veins Sister    Miscarriages / Stillbirths Sister    Arthritis Sister    Depression Sister    Heart disease Sister    Hyperlipidemia Sister    Hypertension Sister    Heart disease Brother 39       CABG x 3    Heart attack Brother 98   Hyperlipidemia Brother    Hypertension Brother    Diabetes Brother    Hypertension Brother    Obesity Brother    Asthma Brother    Depression Brother    Hyperlipidemia Brother    Alcohol abuse Brother    Heart disease Brother    Stroke Brother    Varicose Veins Brother    Early death Brother    Stroke Brother    Arthritis Brother    Depression Brother    Hyperlipidemia Brother    Hypertension Brother    Stroke Maternal Grandfather    Breast cancer Neg Hx     Allergies  Allergen Reactions   Vascepa [Icosapent Ethyl (Epa Ethyl Ester) (Fish)] Hives and Swelling   Aspir-81 [Aspirin] Other (See Comments)    Coagulation disorder   Gabapentin  Other (See Comments)    Cognitive/memory issues   Metformin And Related     indigestion   Mounjaro  [Tirzepatide ] Diarrhea    Epigastric pain     Peanut-Containing Drug Products     Tongue goes numb from peanuts   Statins Other (See Comments)   Topamax [Topiramate] Other (See Comments)    Cognitive issues    Voltaren [Diclofenac Sodium] Hives and Other (See Comments)   Lipitor [Atorvastatin]     Joint pain     REVIEW OF SYSTEMS (Negative unless checked)  Constitutional: [] Weight loss  [] Fever  [] Chills Cardiac: [] Chest pain   [] Chest pressure   [] Palpitations   [] Shortness of breath when laying flat   [] Shortness of breath with exertion. Vascular:  [] Pain in legs with walking   [x] Pain in legs with standing  [] History of DVT   [] Phlebitis   [] Swelling in legs   [x] Varicose veins   []   Non-healing ulcers Pulmonary:   [] Uses home oxygen   [] Productive cough   [] Hemoptysis   [] Wheeze  [] COPD   [] Asthma Neurologic:  [] Dizziness   [] Seizures   [] History of stroke   [] History of TIA  [] Aphasia   [] Vissual changes   [] Weakness or numbness in arm   [] Weakness or numbness in leg Musculoskeletal:   [] Joint swelling   [] Joint pain   [] Low back pain Hematologic:  [] Easy bruising  [] Easy bleeding   [] Hypercoagulable state   [] Anemic Gastrointestinal:  [] Diarrhea   [] Vomiting  [] Gastroesophageal reflux/heartburn   [] Difficulty swallowing. Genitourinary:  [] Chronic kidney disease   [] Difficult urination  [] Frequent urination   [] Blood in urine Skin:  [] Rashes   [] Ulcers  Psychological:  [] History of anxiety   []  History of major depression.  Physical Examination  There were no vitals filed for this visit. There is no height or weight on file to calculate BMI. Gen: WD/WN, NAD Head: Sarah Gordon, No temporalis wasting.  Ear/Nose/Throat: Hearing grossly intact, nares w/o erythema or drainage, pinna without lesions Eyes: PER, EOMI, sclera nonicteric.  Neck: Supple, no gross masses.  No JVD.  Pulmonary:  Good air movement, no audible wheezing, no use of accessory muscles.  Cardiac: RRR, precordium not hyperdynamic. Vascular:  Large varicosities  present, greater than 10 mm bilaterally.  Veins are tender to palpation  Mild venous stasis changes to the legs bilaterally.  Trace soft pitting edema CEAP C3sEpAsPr Vessel Right Left  Radial Palpable Palpable  Gastrointestinal: soft, non-distended. No guarding/no peritoneal signs.  Musculoskeletal: M/S 5/5 throughout.  No deformity.  Neurologic: CN 2-12 intact. Pain and light touch intact in extremities.  Symmetrical.  Speech is fluent. Motor exam as listed above. Psychiatric: Judgment intact, Mood & affect appropriate for pt's clinical situation. Dermatologic: Venous rashes no ulcers noted.  No changes consistent with cellulitis. Lymph : No lichenification or skin changes of chronic lymphedema.  CBC Lab Results  Component Value Date   WBC 4.2 06/15/2022   HGB 11.8 06/15/2022   HCT 35.5 06/15/2022   MCV 94.7 06/15/2022   PLT 232 06/15/2022    BMET    Component Value Date/Time   NA 138 10/08/2022 0847   NA 144 09/11/2014 0948   K 4.9 10/08/2022 0847   CL 105 10/08/2022 0847   CO2 25 10/08/2022 0847   GLUCOSE 136 (H) 10/08/2022 0847   BUN 30 (H) 10/08/2022 0847   BUN 21 09/11/2014 0948   CREATININE 1.09 (H) 10/08/2022 0847   CALCIUM  9.0 10/08/2022 0847   GFRNONAA 57 (L) 02/20/2022 1258   GFRNONAA 70 05/23/2020 1020   GFRAA 81 05/23/2020 1020   CrCl cannot be calculated (Patient's most recent lab result is older than the maximum 21 days allowed.).  COAG No results found for: INR, PROTIME  Radiology VAS US  LOWER EXTREMITY VENOUS POST ABLATION Result Date: 03/10/2023  Lower Venous Reflux Study Patient Name:  Sarah Gordon  Date of Exam:   03/07/2023 Medical Rec #: [id number]      Accession #:    [id number] Date of Birth: 06-26-1950      Patient Gender: F Patient Age:   37 years Exam Location:  [facility] Procedure:      VAS US  LOWER EXTREMITY VENOUS POST ABLATION Referring Phys: CORDELLA Marabelle Cushman  --------------------------------------------------------------------------------  Indications: Post Lt GSV Ablation Imaging.  Performing Technologist: Leafy Gibes RVS  Examination Guidelines: A complete evaluation includes B-mode imaging, spectral Doppler, color Doppler, and power Doppler as needed of all  accessible portions of each vessel. Bilateral testing is considered an integral part of a complete examination. Limited examinations for reoccurring indications may be performed as noted. The reflux portion of the exam is performed with the patient in reverse Trendelenburg. Significant venous reflux is defined as >500 ms in the superficial venous system, and >1 second in the deep venous system.  +--------------+--------+------+----------+------------+-----------------------+ LEFT          Reflux  Reflux  Reflux  Diameter cmsComments                              No       Yes     Time                                       +--------------+--------+------+----------+------------+-----------------------+ CFV           no                                                          +--------------+--------+------+----------+------------+-----------------------+ FV prox       no                                                          +--------------+--------+------+----------+------------+-----------------------+ FV mid        no                                                          +--------------+--------+------+----------+------------+-----------------------+ FV dist       no                                                          +--------------+--------+------+----------+------------+-----------------------+ Popliteal     no                                                          +--------------+--------+------+----------+------------+-----------------------+ GSV at SFJ    no                                                           +--------------+--------+------+----------+------------+-----------------------+ GSV prox thigh  prior                                                                     ablation/stripping      +--------------+--------+------+----------+------------+-----------------------+ GSV mid thigh                                     prior                                                                     ablation/stripping      +--------------+--------+------+----------+------------+-----------------------+ GSV dist thigh                                    prior                                                                     ablation/stripping      +--------------+--------+------+----------+------------+-----------------------+ GSV at knee                                       prior                                                                     ablation/stripping      +--------------+--------+------+----------+------------+-----------------------+   Summary: Left: - No evidence of deep vein thrombosis seen in the left lower extremity, from the common femoral through the popliteal veins. - No evidence of superficial venous thrombosis in the left lower extremity. - There is no evidence of venous reflux seen in the left lower extremity. - No flow seen in the Left GSV via Ablation from knee level to approximately 1.52 cm from the SFJ.  *See table(s) above for measurements and observations. Electronically signed by Cordella Shawl MD on 03/10/2023 at 7:26:36 AM.    Final      Assessment/Plan 1. Varicose veins with inflammation (Primary) Recommend:  The patient has had successful ablation of the previously incompetent saphenous venous system but still has persistent symptoms of pain and swelling that are having a negative impact on daily life and daily activities.  CEAP C3sEpAsPr  Patient should undergo injection  sclerotherapy to treat the residual varicosities of the bilateral lower extremities.  The risks, benefits and alternative therapies were reviewed in detail with the patient.  All  questions were answered.  The patient agrees to proceed with sclerotherapy at their convenience.  The patient will continue wearing the graduated compression stockings and using the over-the-counter pain medications to treat her symptoms.     2. Essential hypertension Continue antihypertensive medications as already ordered, these medications have been reviewed and there are no changes at this time.  3. Gastroesophageal reflux disease, unspecified whether esophagitis present Continue PPI as already ordered, this medication has been reviewed and there are no changes at this time.  Avoidence of caffeine and alcohol  Moderate elevation of the head of the bed     Cordella Shawl, MD  03/27/2023 12:46 PM

## 2023-03-28 ENCOUNTER — Ambulatory Visit (INDEPENDENT_AMBULATORY_CARE_PROVIDER_SITE_OTHER): Payer: Medicare HMO | Admitting: Vascular Surgery

## 2023-03-28 ENCOUNTER — Encounter (INDEPENDENT_AMBULATORY_CARE_PROVIDER_SITE_OTHER): Payer: Self-pay | Admitting: Vascular Surgery

## 2023-03-28 VITALS — BP 123/75 | HR 76 | Resp 16 | Wt 276.6 lb

## 2023-03-28 DIAGNOSIS — I10 Essential (primary) hypertension: Secondary | ICD-10-CM

## 2023-03-28 DIAGNOSIS — K219 Gastro-esophageal reflux disease without esophagitis: Secondary | ICD-10-CM | POA: Diagnosis not present

## 2023-03-28 DIAGNOSIS — I8310 Varicose veins of unspecified lower extremity with inflammation: Secondary | ICD-10-CM

## 2023-03-31 ENCOUNTER — Other Ambulatory Visit: Payer: Self-pay | Admitting: Family Medicine

## 2023-03-31 ENCOUNTER — Encounter: Payer: Self-pay | Admitting: Family Medicine

## 2023-03-31 ENCOUNTER — Ambulatory Visit (INDEPENDENT_AMBULATORY_CARE_PROVIDER_SITE_OTHER): Payer: Medicare HMO | Admitting: Family Medicine

## 2023-03-31 VITALS — BP 142/96 | HR 86 | Resp 16 | Ht 65.0 in | Wt 273.4 lb

## 2023-03-31 DIAGNOSIS — M545 Low back pain, unspecified: Secondary | ICD-10-CM

## 2023-03-31 DIAGNOSIS — F3340 Major depressive disorder, recurrent, in remission, unspecified: Secondary | ICD-10-CM

## 2023-03-31 MED ORDER — LIDOCAINE 5 % EX PTCH: 1.0000 | MEDICATED_PATCH | CUTANEOUS | 0 refills | Status: DC

## 2023-03-31 NOTE — Progress Notes (Signed)
 Name: Sarah Gordon   MRN: [id number]    DOB: 08/30/1950   Date:03/31/2023       Progress Note  Subjective  Chief Complaint  Chief Complaint  Patient presents with   Back Pain    Low back started yesterday, pt helped at church with some food donations unsure if pulled a muscle or hurt her back while doing it. Pain is right above coccyx bone    HPI Discussed the use of AI scribe software for clinical note transcription with the patient, who gave verbal consent to proceed.  History of Present Illness       Discussed the use of AI scribe software for clinical note transcription with the patient, who gave verbal consent to proceed.  History of Present Illness   The patient, with a history of degenerative disc disease and sciatica, presents with a new onset of localized right  lower back pain that started the previous day. The pain was triggered by a period of standing and sorting donations at a food pantry, although the patient denies lifting heavy objects. The pain is described as being in a specific spot in the right  lower back, without radiation to the buttocks or down the leg. The patient has been managing the pain with tramadol  and a heating pad, which provides some relief until she starts moving around. She is allergic to Diclofenac  The patient also has a history of restless leg syndrome and muscle spasms, for which she takes tizanidine  at night and baclofen  as needed during the day. She recently completed a Medrol  Dose pack for an unspecified condition. The patient denies any new medications and reports no other changes in her health.           Patient Active Problem List   Diagnosis Date Noted   Background diabetic retinopathy (HCC) 01/03/2023   Varicose veins with inflammation 07/16/2022   Cognitive complaints 10/27/2021   Polyp of colon    Lumbar radiculopathy 11/06/2020   Ventricular flutter (HCC) 12/06/2018   MDD (major depressive disorder), recurrent episode, mild (HCC)  09/04/2018   DDD (degenerative disc disease), cervical 01/23/2016   Degenerative joint disease of left acromioclavicular joint 01/23/2016   Recurrent oral herpes simplex infection 09/01/2015   Irritable bowel syndrome with diarrhea 08/07/2015   Statin intolerance 02/28/2015   Asthma, intermittent 09/10/2014   Controlled type 2 diabetes with neuropathy (HCC) 09/10/2014   Fibromyalgia 09/10/2014   Obstructive sleep apnea 09/10/2014   Hyperlipidemia 12/29/2012   Essential hypertension 12/29/2012   Morbid obesity (HCC) 12/29/2012   Arthritis due to gout 12/13/2007   Acid reflux 10/24/2006   Adult hypothyroidism 10/24/2006   Localized osteoarthrosis 10/24/2006    Past Surgical History:  Procedure Laterality Date   BREAST BIOPSY Left    benign   CATARACT EXTRACTION W/PHACO Left 01/18/2023   Procedure: CATARACT EXTRACTION PHACO AND INTRAOCULAR LENS PLACEMENT (IOC) LEFT DIABETIC  CLAREON VIVITY TORIC LENS;  Surgeon: Jaye Fallow, MD;  Location: [facility];  Service: Ophthalmology;  Laterality: Left;  4.47 0:31.9   CATARACT EXTRACTION W/PHACO Right 02/01/2023   Procedure: CATARACT EXTRACTION PHACO AND INTRAOCULAR LENS PLACEMENT (IOC) RIGHT DIABETIC  CLAREON VIVITY TORIC LENS 3.68 00:28.9;  Surgeon: Jaye Fallow, MD;  Location: [facility];  Service: Ophthalmology;  Laterality: Right;   COLONOSCOPY     Dr Desiderio   COLONOSCOPY WITH PROPOFOL  N/A 11/19/2015   Procedure: COLONOSCOPY WITH PROPOFOL ;  Surgeon: Louanne KANDICE Muse, MD;  Location: [facility];  Service: Endoscopy;  Laterality: N/A;   COLONOSCOPY WITH PROPOFOL  N/A 06/18/2021   Procedure: COLONOSCOPY WITH PROPOFOL ;  Surgeon: Unk Corinn Skiff, MD;  Location: [facility];  Service: Gastroenterology;  Laterality: N/A;   ESOPHAGOGASTRODUODENOSCOPY N/A 08/20/2014   Procedure: ESOPHAGOGASTRODUODENOSCOPY (EGD);  Surgeon: Louanne KANDICE Muse, MD;  Location: [facility] END[facility]COPY;  Service: Endoscopy;  Laterality:  N/A;   ESOPHAGOGASTRODUODENOSCOPY (EGD) WITH PROPOFOL  N/A 06/18/2021   Procedure: ESOPHAGOGASTRODUODENOSCOPY (EGD) WITH PROPOFOL ;  Surgeon: Unk Corinn Skiff, MD;  Location: [facility];  Service: Gastroenterology;  Laterality: N/A;   FOOT SURGERY Left    heel spur   HERNIA REPAIR     umbilical   NASAL SINUS SURGERY  1980s   UMBILICAL HERNIA REPAIR      Family History  Problem Relation Age of Onset   Hypertension Mother    Hyperlipidemia Mother    Diabetes Mother    Heart disease Mother    Stroke Mother    Varicose Veins Mother    Alcohol abuse Father    Lupus Sister    Arthritis Sister    Depression Sister    Obesity Sister    Asthma Sister    Depression Sister    Hypertension Sister    Miscarriages / Stillbirths Sister    Varicose Veins Sister    Depression Sister    Arthritis Sister    Heart disease Sister    Hyperlipidemia Sister    Hypertension Sister    Miscarriages / Stillbirths Sister    Varicose Veins Sister    Miscarriages / Stillbirths Sister    Arthritis Sister    Depression Sister    Heart disease Sister    Hyperlipidemia Sister    Hypertension Sister    Heart disease Brother 90       CABG x 3    Heart attack Brother 31   Hyperlipidemia Brother    Hypertension Brother    Diabetes Brother    Hypertension Brother    Obesity Brother    Asthma Brother    Depression Brother    Hyperlipidemia Brother    Alcohol abuse Brother    Heart disease Brother    Stroke Brother    Varicose Veins Brother    Early death Brother    Stroke Brother    Arthritis Brother    Depression Brother    Hyperlipidemia Brother    Hypertension Brother    Stroke Maternal Grandfather    Breast cancer Neg Hx     Social History   Tobacco Use   Smoking status: Never   Smokeless tobacco: Never  Substance Use Topics   Alcohol use: No     Current Outpatient Medications:    acetaminophen  (TYLENOL ) 650 MG CR tablet, Take 1,300 mg by mouth 2 (two) times daily., Disp:  , Rfl:    albuterol  (VENTOLIN  HFA) 108 (90 Base) MCG/ACT inhaler, Inhale 2 puffs into the lungs every 6 (six) hours as needed for wheezing or shortness of breath., Disp: 1 each, Rfl: 2   baclofen  (LIORESAL ) 10 MG tablet, Take 10 mg by mouth 3 (three) times daily as needed for muscle spasms., Disp: , Rfl:    benzonatate  (TESSALON  PERLES) 100 MG capsule, Take 1 capsule (100 mg total) by mouth 3 (three) times daily as needed for cough., Disp: 20 capsule, Rfl: 0   buPROPion  (WELLBUTRIN  XL) 150 MG 24 hr tablet, Take 1 tablet (150 mg total) by mouth in the morning., Disp: 90 tablet, Rfl: 0   DULoxetine  (CYMBALTA ) 60 MG capsule, Take 1  capsule (60 mg total) by mouth daily., Disp: 90 capsule, Rfl: 0   Evolocumab  (REPATHA ) 140 MG/ML SOSY, Inject 140 mg into the skin every 14 (fourteen) days., Disp: 6.3 mL, Rfl: 2   ezetimibe  (ZETIA ) 10 MG tablet, Take 1 tablet (10 mg total) by mouth daily., Disp: 90 tablet, Rfl: 1   folic acid  (FOLVITE ) 1 MG tablet, Take 1 tablet (1 mg total) by mouth daily., Disp: 90 tablet, Rfl: 1   hydrOXYzine  (ATARAX ) 10 MG tablet, Take 1 tablet (10 mg total) by mouth 3 (three) times daily as needed., Disp: 30 tablet, Rfl: 0   levothyroxine  (SYNTHROID ) 100 MCG tablet, TAKE 1 TABLET BY MOUTH DAILY, Disp: 90 tablet, Rfl: 0   methylPREDNISolone  (MEDROL  DOSEPAK) 4 MG TBPK tablet, Please follow instruction on dose pack., Disp: 21 each, Rfl: 0   metoprolol  succinate (TOPROL -XL) 25 MG 24 hr tablet, Take 1 tablet (25 mg total) by mouth daily., Disp: 90 tablet, Rfl: 1   montelukast  (SINGULAIR ) 10 MG tablet, Take 1 tablet (10 mg total) by mouth at bedtime., Disp: 90 tablet, Rfl: 0   Multiple Vitamin (MULTIVITAMIN) tablet, Take 1 tablet by mouth daily., Disp: , Rfl:    olmesartan -hydrochlorothiazide  (BENICAR  HCT) 40-12.5 MG tablet, TAKE 1 TABLET BY MOUTH DAILY, Disp: 90 tablet, Rfl: 0   omeprazole  (PRILOSEC) 20 MG capsule, Take 1 capsule (20 mg total) by mouth 2 (two) times daily before a meal.,  Disp: 180 capsule, Rfl: 1   QUEtiapine  (SEROQUEL ) 25 MG tablet, Take 1 tablet (25 mg total) by mouth at bedtime., Disp: 90 tablet, Rfl: 0   rOPINIRole  (REQUIP ) 0.5 MG tablet, TAKE 1 TABLET BY MOUTH AT BEDTIME, Disp: 90 tablet, Rfl: 1   tiZANidine  (ZANAFLEX ) 4 MG tablet, Take 1 tablet (4 mg total) by mouth at bedtime as needed for muscle spasms., Disp: 90 tablet, Rfl: 1   ALPRAZolam  (XANAX ) 0.5 MG tablet, Take 1 tab one hour before procedure and 1 tab when you arrive in office, Disp: 2 tablet, Rfl: 0   ALPRAZolam  (XANAX ) 0.5 MG tablet, Take 1 tab one hour before procedure and 1 tab when you arrive in office, Disp: 2 tablet, Rfl: 0  Allergies  Allergen Reactions   Vascepa [Icosapent Ethyl (Epa Ethyl Ester) (Fish)] Hives and Swelling   Aspir-81 [Aspirin] Other (See Comments)    Coagulation disorder   Gabapentin  Other (See Comments)    Cognitive/memory issues   Metformin And Related     indigestion   Mounjaro  [Tirzepatide ] Diarrhea    Epigastric pain    Peanut-Containing Drug Products     Tongue goes numb from peanuts   Statins Other (See Comments)   Topamax [Topiramate] Other (See Comments)    Cognitive issues    Voltaren [Diclofenac Sodium] Hives and Other (See Comments)   Lipitor [Atorvastatin]     Joint pain    I personally reviewed active problem list, medication list, allergies with the patient/caregiver today.   ROS  Ten systems reviewed and is negative except as mentioned in HPI    Objective  Vitals:   03/31/23 1459 03/31/23 1511  BP: (!) 146/100 (!) 142/96  Pulse: 86   Resp: 16   SpO2: 96%   Weight: 273 lb 6.4 oz (124 kg)   Height: 5' 5 (1.651 m)     Body mass index is 45.5 kg/m.  Physical Exam  Constitutional: Patient appears well-developed and well-nourished. Obese  No distress.  HEENT: head atraumatic, normocephalic, pupils equal and reactive to light, neck supple Cardiovascular:  Normal rate, regular rhythm and normal heart sounds.  No murmur heard. No  BLE edema. Pulmonary/Chest: Effort normal and breath sounds normal. No respiratory distress. Abdominal: Soft.  There is no tenderness. Muscular skeletal: pain during palpation of right lower back, negative straight leg raise t Psychiatric: Patient has a normal mood and affect. behavior is normal. Judgment and thought content normal.   Recent Results (from the past 2160 hours)  POCT HgB A1C     Status: Abnormal   Collection Time: 01/04/23 10:12 AM  Result Value Ref Range   Hemoglobin A1C 6.3 (A) 4.0 - 5.6 %   HbA1c POC (<> result, manual entry)     HbA1c, POC (prediabetic range)     HbA1c, POC (controlled diabetic range)    [facility] Molecular Screen- Blood ([facility])     Status: None   Collection Time: 01/13/23  1:07 PM  Result Value Ref Range   Genetic Analysis Overall Interpretation Negative    Genetic Disease Assessed      [facility] Tier One Population Screen is a screening test that analyzes 11 genes related to hereditary breast and ovarian cancer (HBOC) syndrome, Lynch syndrome, and familial hypercholesterolemia. This test only reports clinically significant pathogenic and  likely pathogenic variants but does not report variants of uncertain significance (VUS). In addition, analysis of the PMS2 gene excludes exons 11-15, which overlap with a known pseudogene (PMS2CL).    Genetic Analysis Report      No pathogenic or likely pathogenic variants were detected in the genes analyzed by this test.Genetic test results should be interpreted in the context of an individual's personal medical and family history. Alteration to medical management is NOT  recommended based solely on this result. Clinical correlation is advised.Additional Considerations- This is a screening test; individuals may still carry pathogenic or likely pathogenic variant(s) in the tested genes that are not detected by this test.-  For individuals at risk for these or other related conditions based on factors  including personal or family history, diagnostic testing is recommended.- The absence of pathogenic or likely pathogenic variant(s) in the analyzed genes, while reassuring,  does not eliminate the possibility of a hereditary condition; there are other variants and genes associated with heart disease and hereditary cancer that are not included in this test.    Genes Tested See Notes     Comment: APOB, BRCA1, BRCA2, EPCAM, LDLR, LDLRAP1, PCSK9, PMS2, MLH1, MSH2, MSH6   Disclaimer See Notes     Comment: This test was developed and validated by [facility] This test has not been cleared or approved by the [place]  Food and Drug Administration (FDA). The [facility] laboratory is accredited by the College of American Pathologists (CAP) and certified under  the Clinical Laboratory Improvement Amendments (CLIA #: [id number]) to perform high-complexity clinical tests. This test is used for clinical purposes. It should not be regarded as investigational or for research.    Sequencing Location See Notes     Comment: Sequencing done at [facility] [address], [address], [place] [address] (CLIA# [id number])   Interpretation Methods and Limitations See Notes     Comment: Extracted DNA is enriched for targeted regions and then sequenced using the [facility] Exome+ (R) assay on an Illumina DNA sequencing system. Data is then aligned to a modified version of GRCh38 and all genes are analyzed using the MANE transcript and MANE  Plus Clinical transcript, when available. Small variant calling is completed using a customized version  of [employer]'s DNAseq software, augmented by a proprietary small variant caller for difficult variants. Copy number variants (CNVs) are then called  using a proprietary bioinformatics pipeline based on depth analysis with a comparison to similarly sequenced samples. Analysis of the PMS2 gene is limited to exons 1-10. The interpretation and reporting of variants in APOB, PCSK9, and  LDLR is specific to  familial hypercholesterolemia; variants associated with hypobetalipoproteinemia are not included. Interpretation is based upon guidelines published by the [employer]) and the Association for  Molecular  Pathology (AMP) or their modification by [employer] when available. Interpretation is limited to the transcripts indicated on the report and +/- 10 bp into intronic regions, except as noted below. [facility] variant classifications  include pathogenic, likely pathogenic, variant of uncertain significance (VUS), likely benign, and benign. Only variants classified as pathogenic and likely pathogenic are included in the report. All reported variants are confirmed through secondary  manual inspection of DNA sequence data or orthogonal testing. Risk estimations and management guidelines included in this report are based on analysis of primary literature and recommendations of applicable professional societies, and should be regarded  as approximations.Based on validation studies, this assay delivers > 99% sensitivity and specificity for single nucleotide variants and insertions and deletions (indels) up to 20 bp. Larger indels and complex variants are a lso reported but sensitivity  may be reduced. Based on validation studies, this assay delivers > 99% sensitivity to multi-exon CNVs and > 90% sensitivity to single-exon CNVs. This test may not detect variants in challenging regions (such as short tandem repeats, homopolymer runs, and  segment duplications), sub-exonic CNVs, chromosomal aneuploidy, or variants in the presence of mosaicism. Phasing will be attempted and reported, when possible. Structural rearrangements such as inversions, translocations, and gene conversions are not  tested in this assay unless explicitly indicated. Additionally, deep intronic, promoter, and enhancer regions may not be covered. It is important  to note that this is a screening test and cannot detect all disease-causing variants. A negative result does  not guarantee the absence of a rare, undetectable variant in the genes analyzed; consider using a diagnostic test if there is significant personal and/or family history of one of the conditions analyze d by this test. Any potential incidental findings  outside of these genes and conditions will not be identified, nor reported. The results of a genetic test may be influenced by various factors, including bone marrow transplantation, blood transfusions, or in rare cases, hematolymphoid neoplasms.Gene  Specific Notes:APOB: analysis is limited to c.10580G>A and c.10579C>T; BRCA1: sequencing analysis extends to CDS +/-20 bp; BRCA2: sequencing analysis extends to CDS +/-20 bp. EPCAM: analysis is limited to CNV of exons 8-9; MLH1: analysis includes CNV of  the promoter; PMS2: analysis is limited to exons 1-10.Donnice JINNY Kemp, PhD, [email or web address]     Diabetic Foot Exam:     PHQ2/9:    03/31/2023    2:52 PM 01/04/2023   10:16 AM 10/08/2022    8:34 AM 09/01/2022    8:25 AM 07/19/2022    8:36 AM  Depression screen PHQ 2/9  Decreased Interest 0 1 0 1 1  Down, Depressed, Hopeless 0 1 0 1 1  PHQ - 2 Score 0 2 0 2 2  Altered sleeping 0 2 0 1 1  Tired, decreased energy 0 2 0 1 1  Change in appetite 0 1 0 0 0  Feeling bad or failure about yourself  0 0  0 0 0  Trouble concentrating 0 0 0 0 0  Moving slowly or fidgety/restless 0 0 0 0 0  Suicidal thoughts 0 0 0 0 0  PHQ-9 Score 0 7 0 4 4  Difficult doing work/chores Not difficult at all Somewhat difficult Not difficult at all  Somewhat difficult    phq 9 is negative   Fall Risk:    03/31/2023    2:52 PM 01/04/2023   10:10 AM 10/08/2022    8:31 AM 09/01/2022    8:25 AM 07/19/2022    8:36 AM  Fall Risk   Falls in the past year? 0 0 1 1 1   Number falls in past yr: 0  1 0 1  Injury with Fall? 0  0 1 1  Risk for fall due to : No  Fall Risks Impaired balance/gait  Impaired mobility   Follow up Falls prevention discussed;Education provided;Falls evaluation completed Falls prevention discussed;Education provided;Falls evaluation completed  Falls prevention discussed      Assessment & Plan      Acute Low Back Pain New onset localized right  lower back pain after lifting. No radicular symptoms. History of degenerative disc disease and sciatica. Pain not relieved with Tramadol  and heat. -Apply Lidoderm   patch to the affected area, 12 hours on and 12 hours off. -Continue Baclofen  and Tizanidine  as needed for muscle spasms. -Continue heat application and gentle movements, avoid prolonged sitting, standing, or walking. -Check cost of Lidoderm  patch with insurance and consider GoodRx if not covered.  Hypertension Elevated blood pressure likely secondary to pain. -Monitor blood pressure at home, reassess at next visit.  Follow-up in 1 month for routine care and reassessment of back pain and blood pressure.

## 2023-04-21 ENCOUNTER — Other Ambulatory Visit: Payer: Self-pay | Admitting: Family Medicine

## 2023-04-21 DIAGNOSIS — F3340 Major depressive disorder, recurrent, in remission, unspecified: Secondary | ICD-10-CM

## 2023-04-25 DIAGNOSIS — F339 Major depressive disorder, recurrent, unspecified: Secondary | ICD-10-CM | POA: Diagnosis not present

## 2023-04-25 DIAGNOSIS — G4733 Obstructive sleep apnea (adult) (pediatric): Secondary | ICD-10-CM | POA: Diagnosis not present

## 2023-04-25 DIAGNOSIS — Z820 Family history of epilepsy and other diseases of the nervous system: Secondary | ICD-10-CM | POA: Diagnosis not present

## 2023-04-25 DIAGNOSIS — G479 Sleep disorder, unspecified: Secondary | ICD-10-CM | POA: Diagnosis not present

## 2023-04-25 DIAGNOSIS — G2581 Restless legs syndrome: Secondary | ICD-10-CM | POA: Diagnosis not present

## 2023-04-25 DIAGNOSIS — G3184 Mild cognitive impairment, so stated: Secondary | ICD-10-CM | POA: Diagnosis not present

## 2023-05-09 ENCOUNTER — Ambulatory Visit (INDEPENDENT_AMBULATORY_CARE_PROVIDER_SITE_OTHER): Payer: Medicare HMO | Admitting: Family Medicine

## 2023-05-09 ENCOUNTER — Encounter: Payer: Self-pay | Admitting: Family Medicine

## 2023-05-09 VITALS — BP 124/76 | HR 89 | Temp 98.0°F | Resp 16 | Ht 65.0 in | Wt 281.9 lb

## 2023-05-09 DIAGNOSIS — E1140 Type 2 diabetes mellitus with diabetic neuropathy, unspecified: Secondary | ICD-10-CM

## 2023-05-09 DIAGNOSIS — E669 Obesity, unspecified: Secondary | ICD-10-CM

## 2023-05-09 DIAGNOSIS — J4520 Mild intermittent asthma, uncomplicated: Secondary | ICD-10-CM | POA: Diagnosis not present

## 2023-05-09 DIAGNOSIS — K219 Gastro-esophageal reflux disease without esophagitis: Secondary | ICD-10-CM

## 2023-05-09 DIAGNOSIS — E1169 Type 2 diabetes mellitus with other specified complication: Secondary | ICD-10-CM | POA: Diagnosis not present

## 2023-05-09 DIAGNOSIS — G4733 Obstructive sleep apnea (adult) (pediatric): Secondary | ICD-10-CM | POA: Diagnosis not present

## 2023-05-09 DIAGNOSIS — F3340 Major depressive disorder, recurrent, in remission, unspecified: Secondary | ICD-10-CM

## 2023-05-09 DIAGNOSIS — G3184 Mild cognitive impairment, so stated: Secondary | ICD-10-CM

## 2023-05-09 DIAGNOSIS — E1159 Type 2 diabetes mellitus with other circulatory complications: Secondary | ICD-10-CM

## 2023-05-09 DIAGNOSIS — E538 Deficiency of other specified B group vitamins: Secondary | ICD-10-CM | POA: Diagnosis not present

## 2023-05-09 DIAGNOSIS — I152 Hypertension secondary to endocrine disorders: Secondary | ICD-10-CM

## 2023-05-09 DIAGNOSIS — G2581 Restless legs syndrome: Secondary | ICD-10-CM

## 2023-05-09 DIAGNOSIS — E039 Hypothyroidism, unspecified: Secondary | ICD-10-CM

## 2023-05-09 LAB — POCT GLYCOSYLATED HEMOGLOBIN (HGB A1C): Hemoglobin A1C: 6.4 % — AB (ref 4.0–5.6)

## 2023-05-09 MED ORDER — MONTELUKAST SODIUM 10 MG PO TABS: 10.0000 mg | ORAL_TABLET | Freq: Every day | ORAL | 1 refills | Status: DC

## 2023-05-09 MED ORDER — DULOXETINE HCL 60 MG PO CPEP: 60.0000 mg | ORAL_CAPSULE | Freq: Every day | ORAL | 1 refills | Status: DC

## 2023-05-09 MED ORDER — METOPROLOL SUCCINATE ER 25 MG PO TB24: 25.0000 mg | ORAL_TABLET | Freq: Every day | ORAL | 1 refills | Status: DC

## 2023-05-09 MED ORDER — ROPINIROLE HCL 1 MG PO TABS: 1.0000 mg | ORAL_TABLET | Freq: Every day | ORAL | 0 refills | Status: DC

## 2023-05-09 MED ORDER — BUPROPION HCL ER (XL) 150 MG PO TB24: 150.0000 mg | ORAL_TABLET | Freq: Every morning | ORAL | 1 refills | Status: DC

## 2023-05-09 MED ORDER — LEVOTHYROXINE SODIUM 100 MCG PO TABS: ORAL_TABLET | ORAL | 1 refills | Status: DC

## 2023-05-09 MED ORDER — QUETIAPINE FUMARATE 25 MG PO TABS: 25.0000 mg | ORAL_TABLET | Freq: Every day | ORAL | 1 refills | Status: DC

## 2023-05-09 MED ORDER — OLMESARTAN MEDOXOMIL-HCTZ 40-12.5 MG PO TABS: 1.0000 | ORAL_TABLET | Freq: Every day | ORAL | 1 refills | Status: DC

## 2023-05-09 NOTE — Progress Notes (Signed)
Name: Sarah Gordon   MRN: [id number]    DOB: 01-18-1951   Date:05/09/2023       Progress Note  Subjective  Chief Complaint  Chief Complaint  Patient presents with   Medical Management of Chronic Issues   HPI   Mild Cognitive Impairment: seen by Dr. Sherryll Burger, had MRI and went to eye doctor and eye pressure normal. She is going to have repeat CPAP and also was referred to Memory clinic at [facility]   IMPRESSION:  1. Partially empty sella which can be a normal variant but can also  be seen in the setting of intracranial hypertension. Otherwise  normal appearance of the brain.  2. Small left mastoid effusion.    HTN: taking Olmasartan hctz and Toprol XL , bp is at goal.  Denies chest pain or palpitation    GERD: she stopped FODMAP diet, doing well now, taking omeprazole BID She saw GI and evaluated for bloating, she is feeling well now    Ventricular flutter: seen by cardiologist, currently on metoprolol and is doing well, no palpitation   MDD: she is taking Seroquel to sleep, Duloxetine and Wellbutrin. She states still has emotions and stress but tolerating it well. Phq 9 is 5    DMII with dyslipidemia/HTN/neuropathy and background retinopathy: she was doing well on diet only, A1C has been at goal.. Denies polyphagia, polydipsia or polyuria She has a history of neuropathy, very mild symptoms. She is on Zetia , states forgetting to use Repatha - advised to try on the 1st and 14 th of each month advised her to try again.  She is unable to tolerate statin therapy.    Sicca: going on for over one year but getting progressively worse, she also has joint aches . Sjogren's test negative advised to discuss with dentist, she is trying to find a dentist    OA: knees and feet, also has FMS, she states her legs are doing better, except for when radiates down her legs.She uses a walker when she walks longer distances or when back is flaring Stable    Asthma: She has occasional dry cough, SOB and wheezing a  couple of times a month . She stopped using Flovent and doing okay at this time . She still has singulair and ventolin at home , doing well   Morbid Obesity: BMI above 40, her weight is stable at home. Discussed portion control   Hypothyroidism: she is taking medication daily, denies hair loss, she has chronic dry skin and chronic constipation Last TSH was at goal . Continue medication  Patient Active Problem List   Diagnosis Date Noted   Background diabetic retinopathy (HCC) 01/03/2023   Varicose veins with inflammation 07/16/2022   Cognitive complaints 10/27/2021   Polyp of colon    Lumbar radiculopathy 11/06/2020   Ventricular flutter (HCC) 12/06/2018   MDD (major depressive disorder), recurrent episode, mild (HCC) 09/04/2018   DDD (degenerative disc disease), cervical 01/23/2016   Degenerative joint disease of left acromioclavicular joint 01/23/2016   Recurrent oral herpes simplex infection 09/01/2015   Irritable bowel syndrome with diarrhea 08/07/2015   Statin intolerance 02/28/2015   Asthma, intermittent 09/10/2014   Controlled type 2 diabetes with neuropathy (HCC) 09/10/2014   Fibromyalgia 09/10/2014   Obstructive sleep apnea 09/10/2014   Hyperlipidemia 12/29/2012   Essential hypertension 12/29/2012   Morbid obesity (HCC) 12/29/2012   Arthritis due to gout 12/13/2007   Acid reflux 10/24/2006   Adult hypothyroidism 10/24/2006   Localized osteoarthrosis 10/24/2006  Past Surgical History:  Procedure Laterality Date   BREAST BIOPSY Left    benign   CATARACT EXTRACTION W/PHACO Left 01/18/2023   Procedure: CATARACT EXTRACTION PHACO AND INTRAOCULAR LENS PLACEMENT (IOC) LEFT DIABETIC  CLAREON VIVITY TORIC LENS;  Surgeon: Galen Manila, MD;  Location: [facility];  Service: Ophthalmology;  Laterality: Left;  4.47 0:31.9   CATARACT EXTRACTION W/PHACO Right 02/01/2023   Procedure: CATARACT EXTRACTION PHACO AND INTRAOCULAR LENS PLACEMENT (IOC) RIGHT DIABETIC  CLAREON  VIVITY TORIC LENS 3.68 00:28.9;  Surgeon: Galen Manila, MD;  Location[facility];  Service: Ophthalmology;  Laterality: Right;   COLONOSCOPY     Dr Thurmond Butts   COLONOSCOPY WITH PROPOFOL N/A 11/19/2015   Procedure: COLONOSCOPY WITH PROPOFOL;  Surgeon: Kieth Brightly, MD;  Location: [facility] END[facility]COPY;  Service: Endoscopy;  Laterality: N/A;   COLONOSCOPY WITH PROPOFOL N/A 06/18/2021   Procedure: COLONOSCOPY WITH PROPOFOL;  Surgeon: Toney Reil, MD;  Location: [facility]COPY;  Service: Gastroenterology;  Laterality: N/A;   ESOPHAGOGASTRODUODENOSCOPY N/A 08/20/2014   Procedure: ESOPHAGOGASTRODUODENOSCOPY (EGD);  Surgeon: Kieth Brightly, MD;  Location[facility];  Service: Endoscopy;  Laterality: N/A;   ESOPHAGOGASTRODUODENOSCOPY (EGD) WITH PROPOFOL N/A 06/18/2021   Procedure: ESOPHAGOGASTRODUODENOSCOPY (EGD) WITH PROPOFOL;  Surgeon: Toney Reil, MD;  Location: [facility] ENDOSCOPY;  Service: Gastroenterology;  Laterality: N/A;   FOOT SURGERY Left    heel spur   HERNIA REPAIR     umbilical   NASAL SINUS SURGERY  1980s   UMBILICAL HERNIA REPAIR      Family History  Problem Relation Age of Onset   Hypertension Mother    Hyperlipidemia Mother    Diabetes Mother    Heart disease Mother    Stroke Mother    Varicose Veins Mother    Alcohol abuse Father    Lupus Sister    Arthritis Sister    Depression Sister    Obesity Sister    Asthma Sister    Depression Sister    Hypertension Sister    Miscarriages / Stillbirths Sister    Varicose Veins Sister    Depression Sister    Arthritis Sister    Heart disease Sister    Hyperlipidemia Sister    Hypertension Sister    Miscarriages / [place] Sister    Varicose Veins Sister    Miscarriages / Stillbirths Sister    Arthritis Sister    Depression Sister    Heart disease Sister    Hyperlipidemia Sister    Hypertension Sister    Heart disease Brother 23       CABG x 3    Heart attack Brother 25    Hyperlipidemia Brother    Hypertension Brother    Diabetes Brother    Hypertension Brother    Obesity Brother    Asthma Brother    Depression Brother    Hyperlipidemia Brother    Alcohol abuse Brother    Heart disease Brother    Stroke Brother    Varicose Veins Brother    Early death Brother    Stroke Brother    Arthritis Brother    Depression Brother    Hyperlipidemia Brother    Hypertension Brother    Stroke Maternal Grandfather    Breast cancer Neg Hx     Social History   Tobacco Use   Smoking status: Never   Smokeless tobacco: Never  Substance Use Topics   Alcohol use: No     Current Outpatient Medications:    acetaminophen (TYLENOL)  650 MG CR tablet, Take 1,300 mg by mouth 2 (two) times daily., Disp: , Rfl:    albuterol (VENTOLIN HFA) 108 (90 Base) MCG/ACT inhaler, Inhale 2 puffs into the lungs every 6 (six) hours as needed for wheezing or shortness of breath., Disp: 1 each, Rfl: 2   ezetimibe (ZETIA) 10 MG tablet, Take 1 tablet (10 mg total) by mouth daily., Disp: 90 tablet, Rfl: 1   folic acid (FOLVITE) 1 MG tablet, Take 1 tablet (1 mg total) by mouth daily., Disp: 90 tablet, Rfl: 1   hydrOXYzine (ATARAX) 10 MG tablet, Take 1 tablet (10 mg total) by mouth 3 (three) times daily as needed., Disp: 30 tablet, Rfl: 0   lidocaine (LIDODERM) 5 %, Place 1 patch onto the skin daily. Remove & Discard patch within 12 hours or as directed by MD, Disp: 30 patch, Rfl: 0   Multiple Vitamin (MULTIVITAMIN) tablet, Take 1 tablet by mouth daily., Disp: , Rfl:    omeprazole (PRILOSEC) 20 MG capsule, Take 1 capsule (20 mg total) by mouth 2 (two) times daily before a meal., Disp: 180 capsule, Rfl: 1   tiZANidine (ZANAFLEX) 4 MG tablet, Take 1 tablet (4 mg total) by mouth at bedtime as needed for muscle spasms., Disp: 90 tablet, Rfl: 1   buPROPion (WELLBUTRIN XL) 150 MG 24 hr tablet, Take 1 tablet (150 mg total) by mouth every morning., Disp: 90 tablet, Rfl: 1   DULoxetine (CYMBALTA) 60 MG  capsule, Take 1 capsule (60 mg total) by mouth daily., Disp: 90 capsule, Rfl: 1   Evolocumab (REPATHA) 140 MG/ML SOSY, Inject 140 mg into the skin every 14 (fourteen) days., Disp: 6.3 mL, Rfl: 2   levothyroxine (SYNTHROID) 100 MCG tablet, TAKE 1 TABLET BY MOUTH DAILY, Disp: 90 tablet, Rfl: 1   metoprolol succinate (TOPROL-XL) 25 MG 24 hr tablet, Take 1 tablet (25 mg total) by mouth daily., Disp: 90 tablet, Rfl: 1   montelukast (SINGULAIR) 10 MG tablet, Take 1 tablet (10 mg total) by mouth at bedtime., Disp: 90 tablet, Rfl: 1   olmesartan-hydrochlorothiazide (BENICAR HCT) 40-12.5 MG tablet, Take 1 tablet by mouth daily., Disp: 90 tablet, Rfl: 1   QUEtiapine (SEROQUEL) 25 MG tablet, Take 1 tablet (25 mg total) by mouth at bedtime., Disp: 90 tablet, Rfl: 1   rOPINIRole (REQUIP) 1 MG tablet, Take 1 tablet (1 mg total) by mouth at bedtime., Disp: 90 tablet, Rfl: 0  Allergies  Allergen Reactions   Vascepa [Icosapent Ethyl (Epa Ethyl Ester) (Fish)] Hives and Swelling   Aspir-81 [Aspirin] Other (See Comments)    Coagulation disorder   Gabapentin Other (See Comments)    Cognitive/memory issues   Metformin And Related     indigestion   Mounjaro [Tirzepatide] Diarrhea    Epigastric pain    Peanut-Containing Drug Products     Tongue goes numb from peanuts   Statins Other (See Comments)   Topamax [Topiramate] Other (See Comments)    Cognitive issues    Voltaren [Diclofenac Sodium] Hives and Other (See Comments)   Lipitor [Atorvastatin]     Joint pain    I personally reviewed active problem list, medication list, allergies, family history with the patient/caregiver today.   ROS  Ten systems reviewed and is negative except as mentioned in HPI    Objective  Vitals:   05/09/23 0846  BP: 124/76  Pulse: 89  Resp: 16  Temp: 98 F (36.7 C)  TempSrc: Oral  SpO2: 99%  Weight: 281 lb 14.4 oz (127.9  kg)  Height: 5\' 5"  (1.651 m)    Body mass index is 46.91 kg/m.  Physical  Exam  Constitutional: Patient appears well-developed and well-nourished. Obese  No distress.  HEENT: head atraumatic, normocephalic, pupils equal and reactive to light, neck supple Cardiovascular: Normal rate, regular rhythm and normal heart sounds.  No murmur heard. No BLE edema. Pulmonary/Chest: Effort normal and breath sounds normal. No respiratory distress. Abdominal: Soft.  There is no tenderness. Psychiatric: Patient has a normal mood and affect. behavior is normal. Judgment and thought content normal.   Recent Results (from the past 2160 hours)  POCT glycosylated hemoglobin (Hb A1C)     Status: Abnormal   Collection Time: 05/09/23  8:50 AM  Result Value Ref Range   Hemoglobin A1C 6.4 (A) 4.0 - 5.6 %   HbA1c POC (<> result, manual entry)     HbA1c, POC (prediabetic range)     HbA1c, POC (controlled diabetic range)      Diabetic Foot Exam:  Diabetic foot exam was performed with the following findings:   No deformities, ulcerations, or other skin breakdown Normal sensation of 10g monofilament Intact posterior tibialis and dorsalis pedis pulses      PHQ2/9:    05/09/2023    8:47 AM 03/31/2023    2:52 PM 01/04/2023   10:16 AM 10/08/2022    8:34 AM 09/01/2022    8:25 AM  Depression screen PHQ 2/9  Decreased Interest 1 0 1 0 1  Down, Depressed, Hopeless 1 0 1 0 1  PHQ - 2 Score 2 0 2 0 2  Altered sleeping 1 0 2 0 1  Tired, decreased energy 1 0 2 0 1  Change in appetite 0 0 1 0 0  Feeling bad or failure about yourself  0 0 0 0 0  Trouble concentrating 1 0 0 0 0  Moving slowly or fidgety/restless 0 0 0 0 0  Suicidal thoughts 0 0 0 0 0  PHQ-9 Score 5 0 7 0 4  Difficult doing work/chores Somewhat difficult Not difficult at all Somewhat difficult Not difficult at all     phq 9 is positive  Fall Risk:    05/09/2023    8:46 AM 03/31/2023    2:52 PM 01/04/2023   10:10 AM 10/08/2022    8:31 AM 09/01/2022    8:25 AM  Fall Risk   Falls in the past year? 1 0 0 1 1  Number  falls in past yr: 0 0  1 0  Injury with Fall? 0 0  0 1  Risk for fall due to : Impaired balance/gait No Fall Risks Impaired balance/gait  Impaired mobility  Follow up Falls prevention discussed;Education provided;Falls evaluation completed Falls prevention discussed;Education provided;Falls evaluation completed Falls prevention discussed;Education provided;Falls evaluation completed  Falls prevention discussed     Assessment & Plan  1. Controlled type 2 diabetes with neuropathy (HCC) (Primary)  - POCT glycosylated hemoglobin (Hb A1C) - HM Diabetes Foot Exam  2. MDD (recurrent major depressive disorder) in remission (HCC)  - QUEtiapine (SEROQUEL) 25 MG tablet; Take 1 tablet (25 mg total) by mouth at bedtime.  Dispense: 90 tablet; Refill: 1 - DULoxetine (CYMBALTA) 60 MG capsule; Take 1 capsule (60 mg total) by mouth daily.  Dispense: 90 capsule; Refill: 1 - buPROPion (WELLBUTRIN XL) 150 MG 24 hr tablet; Take 1 tablet (150 mg total) by mouth every morning.  Dispense: 90 tablet; Refill: 1  3. Dyslipidemia associated with type 2 diabetes mellitus (HCC)  -  olmesartan-hydrochlorothiazide (BENICAR HCT) 40-12.5 MG tablet; Take 1 tablet by mouth daily.  Dispense: 90 tablet; Refill: 1 - metoprolol succinate (TOPROL-XL) 25 MG 24 hr tablet; Take 1 tablet (25 mg total) by mouth daily.  Dispense: 90 tablet; Refill: 1  4. Morbid obesity (HCC)  Discussed with the patient the risk posed by an increased BMI. Discussed importance of portion control, calorie counting and at least 150 minutes of physical activity weekly. Avoid sweet beverages and drink more water. Eat at least 6 servings of fruit and vegetables daily    5. RLS (restless legs syndrome)  - rOPINIRole (REQUIP) 1 MG tablet; Take 1 tablet (1 mg total) by mouth at bedtime.  Dispense: 90 tablet; Refill: 0  6. Obesity, diabetes, and hypertension syndrome (HCC)  Continue medications, needs to lose weight   7. Gastroesophageal reflux disease  without esophagitis  Stable on PPI  8. Mild intermittent asthma without complication  - montelukast (SINGULAIR) 10 MG tablet; Take 1 tablet (10 mg total) by mouth at bedtime.  Dispense: 90 tablet; Refill: 1  9. Adult hypothyroidism  - levothyroxine (SYNTHROID) 100 MCG tablet; TAKE 1 TABLET BY MOUTH DAILY  Dispense: 90 tablet; Refill: 1  10. Low folic acid  Continue supplementation   11. OSA (obstructive sleep apnea)  Going to have a repeat study ordered by neurologist   12. MCI (mild cognitive impairment)  Seeing neurologist, and was referred to [facility] clinic

## 2023-05-17 ENCOUNTER — Emergency Department: Payer: Medicare HMO

## 2023-05-17 ENCOUNTER — Other Ambulatory Visit: Payer: Self-pay

## 2023-05-17 ENCOUNTER — Encounter: Payer: Self-pay | Admitting: Emergency Medicine

## 2023-05-17 ENCOUNTER — Emergency Department
Admission: EM | Admit: 2023-05-17 | Discharge: 2023-05-17 | Disposition: A | Discharge status: Home / Self Care | Payer: Medicare HMO | Attending: Student in an Organized Health Care Education/Training Program | Admitting: Student in an Organized Health Care Education/Training Program

## 2023-05-17 DIAGNOSIS — R109 Unspecified abdominal pain: Secondary | ICD-10-CM | POA: Diagnosis not present

## 2023-05-17 DIAGNOSIS — R1084 Generalized abdominal pain: Secondary | ICD-10-CM | POA: Insufficient documentation

## 2023-05-17 LAB — COMPREHENSIVE METABOLIC PANEL
ALT: 21 U/L (ref 0–44)
AST: 23 U/L (ref 15–41)
Albumin: 3.7 g/dL (ref 3.5–5.0)
Alkaline Phosphatase: 77 U/L (ref 38–126)
Anion gap: 7 (ref 5–15)
BUN: 31 mg/dL — ABNORMAL HIGH (ref 8–23)
CO2: 25 mmol/L (ref 22–32)
Calcium: 8.9 mg/dL (ref 8.9–10.3)
Chloride: 102 mmol/L (ref 98–111)
Creatinine, Ser: 1.04 mg/dL — ABNORMAL HIGH (ref 0.44–1.00)
GFR, Estimated: 58 mL/min — ABNORMAL LOW (ref >60)
Glucose, Bld: 154 mg/dL — ABNORMAL HIGH (ref 70–99)
Potassium: 4.3 mmol/L (ref 3.5–5.1)
Sodium: 134 mmol/L — ABNORMAL LOW (ref 135–145)
Total Bilirubin: 0.3 mg/dL (ref 0.0–1.2)
Total Protein: 7.4 g/dL (ref 6.5–8.1)

## 2023-05-17 LAB — URINALYSIS, ROUTINE W REFLEX MICROSCOPIC
Bilirubin Urine: NEGATIVE
Glucose, UA: NEGATIVE mg/dL
Hgb urine dipstick: NEGATIVE
Ketones, ur: NEGATIVE mg/dL
Leukocytes,Ua: NEGATIVE
Nitrite: NEGATIVE
Protein, ur: NEGATIVE mg/dL
Specific Gravity, Urine: 1.033 — ABNORMAL HIGH (ref 1.005–1.030)
pH: 5 (ref 5.0–8.0)

## 2023-05-17 LAB — CBC
HCT: 37.6 % (ref 36.0–46.0)
Hemoglobin: 12.6 g/dL (ref 12.0–15.0)
MCH: 32.4 pg (ref 26.0–34.0)
MCHC: 33.5 g/dL (ref 30.0–36.0)
MCV: 96.7 fL (ref 80.0–100.0)
Platelets: 328 10*3/uL (ref 150–400)
RBC: 3.89 MIL/uL (ref 3.87–5.11)
RDW: 13.1 % (ref 11.5–15.5)
WBC: 11.1 10*3/uL — ABNORMAL HIGH (ref 4.0–10.5)
nRBC: 0 % (ref 0.0–0.2)

## 2023-05-17 LAB — LIPASE, BLOOD: Lipase: 35 U/L (ref 11–51)

## 2023-05-17 MED ORDER — IOHEXOL 300 MG/ML  SOLN
100.0000 mL | Freq: Once | INTRAMUSCULAR | Status: AC | PRN
  Administered 2023-05-17: 100 mL via INTRAVENOUS

## 2023-05-17 MED ORDER — SODIUM CHLORIDE 0.9 % IV BOLUS
500.0000 mL | Freq: Once | INTRAVENOUS | Status: AC
  Administered 2023-05-17: 500 mL via INTRAVENOUS

## 2023-05-17 MED ORDER — ONDANSETRON HCL 4 MG/2ML IJ SOLN
4.0000 mg | Freq: Once | INTRAMUSCULAR | Status: AC
  Administered 2023-05-17: 4 mg via INTRAVENOUS
  Filled 2023-05-17: qty 2

## 2023-05-17 MED ORDER — MORPHINE SULFATE (PF) 4 MG/ML IV SOLN
4.0000 mg | INTRAVENOUS | Status: DC | PRN
  Administered 2023-05-17: 4 mg via INTRAVENOUS
  Filled 2023-05-17: qty 1

## 2023-05-17 NOTE — ED Triage Notes (Signed)
 Patient to ED via POV for abd pain since last PM. States soreness in ribs with nausea and dry heaving. Last BM yesterday.

## 2023-05-17 NOTE — ED Provider Notes (Signed)
 [facility] Provider Note    Event Date/Time   First MD Initiated Contact with Patient 05/17/23 [phone]     (approximate)   History   Abdominal Pain   HPI  Sarah Gordon is a 73 y.o. female who presents to the ER for evaluation of generalized abdominal pain distention and pain in her lower ribs.  Has had some nausea and dry heaves.  Denies any diarrhea.  Has had an episode like this in the past but it was self-limiting and resolved without intervention.  Has had periumbilical hernia repair remotely.  Denies any flank pain no back pain.     Physical Exam   Triage Vital Signs: ED Triage Vitals  Encounter Vitals Group     BP 05/17/23 0739 (!) 167/93     Systolic BP Percentile --      Diastolic BP Percentile --      Pulse Rate 05/17/23 0739 66     Resp 05/17/23 0739 18     Temp 05/17/23 0739 98.1 F (36.7 C)     Temp Source 05/17/23 0739 Oral     SpO2 05/17/23 0739 98 %     Weight 05/17/23 0740 275 lb (124.7 kg)     Height 05/17/23 0740 5\' 6"  (1.676 m)     Head Circumference --      Peak Flow --      Pain Score 05/17/23 0740 10     Pain Loc --      Pain Education --      Exclude from Growth Chart --     Most recent vital signs: Vitals:   05/17/23 1000 05/17/23 1030  BP: (!) 151/74 (!) 152/88  Pulse: 65 78  Resp: 18 18  Temp:    SpO2: 97% 91%     Constitutional: Alert  Eyes: Conjunctivae are normal.  Head: Atraumatic. Nose: No congestion/rhinnorhea. Mouth/Throat: Mucous membranes are moist.   Neck: Painless ROM.  Cardiovascular:   Good peripheral circulation. Respiratory: Normal respiratory effort.  No retractions.  Gastrointestinal: Soft and nontender.  Musculoskeletal:  no deformity Neurologic:  MAE spontaneously. No gross focal neurologic deficits are appreciated.  Skin:  Skin is warm, dry and intact. No rash noted. Psychiatric: Mood and affect are normal. Speech and behavior are normal.    ED Results / Procedures / Treatments    Labs (all labs ordered are listed, but only abnormal results are displayed) Labs Reviewed  COMPREHENSIVE METABOLIC PANEL - Abnormal; Notable for the following components:      Result Value   Sodium 134 (*)    Glucose, Bld 154 (*)    BUN 31 (*)    Creatinine, Ser 1.04 (*)    GFR, Estimated 58 (*)    All other components within normal limits  CBC - Abnormal; Notable for the following components:   WBC 11.1 (*)    All other components within normal limits  URINALYSIS, ROUTINE W REFLEX MICROSCOPIC - Abnormal; Notable for the following components:   Color, Urine YELLOW (*)    APPearance CLEAR (*)    Specific Gravity, Urine 1.033 (*)    All other components within normal limits  LIPASE, BLOOD      RADIOLOGY Please see ED Course for my review and interpretation.  I personally reviewed all radiographic images ordered to evaluate for the above acute complaints and reviewed radiology reports and findings.  These findings were personally discussed with the patient.  Please see medical record for radiology report.  PROCEDURES:  Critical Care performed:   Procedures   MEDICATIONS ORDERED IN ED: Medications  morphine (PF) 4 MG/ML injection 4 mg (4 mg Intravenous Given 05/17/23 0822)  ondansetron (ZOFRAN) injection 4 mg (4 mg Intravenous Given 05/17/23 0821)  sodium chloride 0.9 % bolus 500 mL (0 mLs Intravenous Stopped 05/17/23 1030)  iohexol (OMNIPAQUE) 300 MG/ML solution 100 mL (100 mLs Intravenous Contrast Given 05/17/23 0908)     IMPRESSION / MDM / ASSESSMENT AND PLAN / ED COURSE  I reviewed the triage vital signs and the nursing notes.                              Differential diagnosis includes, but is not limited to, pancreatitis, SBO, colitis, diverticulitis, gastritis, mass  Patient presenting to the ER for evaluation of symptoms as described above.  Based on symptoms, risk factors and considered above differential, this presenting complaint could reflect a  potentially life-threatening illness therefore the patient will be placed on continuous pulse oximetry and telemetry for monitoring.  Laboratory evaluation will be sent to evaluate for the above complaints.  CT imaging will be ordered for the above differential.    Clinical Course as of 05/17/23 1149  Tue May 17, 2023  1017 CT imaging of the abdomen and pelvis on my review and interpretation without evidence of obstruction.  Will await formal radiology report. [PR]  1148 CT imaging is reassuring.  UA without any sign of infection.  Other than borderline leukocytosis patient with a reassuring workup.  Do believe she stable and appropriate for outpatient follow-up. [PR]    Clinical Course User Index [PR] Willy Eddy, MD     FINAL CLINICAL IMPRESSION(S) / ED DIAGNOSES   Final diagnoses:  Generalized abdominal pain     Rx / DC Orders   ED Discharge Orders     None        Note:  This document was prepared using Dragon voice recognition software and may include unintentional dictation errors.    Willy Eddy, MD 05/17/23 1149

## 2023-05-18 ENCOUNTER — Telehealth: Payer: Self-pay

## 2023-05-18 NOTE — Transitions of Care (Post Inpatient/ED Visit) (Signed)
   05/18/2023  Name: Sarah Gordon MRN: [id number] DOB: 18-Feb-1951  Today's TOC FU Call Status: Today's TOC FU Call Status:: Unsuccessful Call (1st Attempt) Unsuccessful Call (1st Attempt) Date: 05/18/23  Attempted to reach the patient regarding the most recent Inpatient/ED visit.  Follow Up Plan: Additional outreach attempts will be made to reach the patient to complete the Transitions of Care (Post Inpatient/ED visit) call.   Signature Agnes Lawrence, CMA (AAMA)  [facility] [phone]

## 2023-05-19 ENCOUNTER — Encounter: Payer: Self-pay | Admitting: Family Medicine

## 2023-05-19 ENCOUNTER — Ambulatory Visit (INDEPENDENT_AMBULATORY_CARE_PROVIDER_SITE_OTHER): Payer: Medicare HMO | Admitting: Family Medicine

## 2023-05-19 ENCOUNTER — Ambulatory Visit
Admission: RE | Admit: 2023-05-19 | Discharge: 2023-05-19 | Disposition: A | Discharge status: Home / Self Care | Payer: Medicare HMO | Source: Ambulatory Visit | Attending: Family Medicine | Admitting: Family Medicine

## 2023-05-19 VITALS — BP 110/70 | HR 99 | Resp 16 | Ht 66.0 in | Wt 277.7 lb

## 2023-05-19 DIAGNOSIS — R1010 Upper abdominal pain, unspecified: Secondary | ICD-10-CM | POA: Diagnosis not present

## 2023-05-19 DIAGNOSIS — R1013 Epigastric pain: Secondary | ICD-10-CM | POA: Insufficient documentation

## 2023-05-19 DIAGNOSIS — K760 Fatty (change of) liver, not elsewhere classified: Secondary | ICD-10-CM | POA: Diagnosis not present

## 2023-05-19 DIAGNOSIS — K8020 Calculus of gallbladder without cholecystitis without obstruction: Secondary | ICD-10-CM | POA: Diagnosis not present

## 2023-05-19 DIAGNOSIS — K828 Other specified diseases of gallbladder: Secondary | ICD-10-CM | POA: Diagnosis not present

## 2023-05-19 DIAGNOSIS — R109 Unspecified abdominal pain: Secondary | ICD-10-CM | POA: Diagnosis not present

## 2023-05-19 MED ORDER — HYOSCYAMINE SULFATE 0.125 MG SL SUBL: 0.1250 mg | SUBLINGUAL_TABLET | SUBLINGUAL | 0 refills | Status: DC | PRN

## 2023-05-19 MED ORDER — ONDANSETRON HCL 4 MG PO TABS: 4.0000 mg | ORAL_TABLET | Freq: Three times a day (TID) | ORAL | 0 refills | Status: AC | PRN

## 2023-05-19 NOTE — Addendum Note (Signed)
 Addended by: Forde Radon on: 05/19/2023 08:48 AM   Modules accepted: Orders

## 2023-05-19 NOTE — Progress Notes (Signed)
 Name: Sarah Gordon   MRN: [id number]    DOB: Sep 28, 1950   Date:05/19/2023       Progress Note  Subjective  Chief Complaint  Chief Complaint  Patient presents with   Abdominal Pain    Went to ER, had a really bad episode last night not any better, pain all over abdomen   Discussed the use of AI scribe software for clinical note transcription with the patient, who gave verbal consent to proceed.  History of Present Illness   [name] "Sarah Gordon" is a 73 year old female who presents with severe abdominal pain.  She presents with severe abdominal pain that began two days ago. The pain is sharp and located primarily in the upper abdomen, particularly around the rib cage, with a sensation as if her ribs might 'pop'. It worsens with deep breaths and is constant, though it comes in waves. The pain is not related to meals, as it occurs even at night, but also triggered by smells and eating.  She went to [facility] on 05/17/2023 for evaluation of the abdominal pain, the emergency room, she received morphine, which provided temporary relief. However, the pain returned the following night. She has experienced dry heaves but no vomiting, diarrhea, or changes in bowel movements. She reports having had three to four small bowel movements over the past few days. She denies passing gas and notes that food makes her queasy and worsens the pain.  Her past medical history includes a previous H. pylori infection, and she is currently taking omeprazole 20 mg twice daily. She has not had an ultrasound of her gallbladder before, and there is a mention of a possible small gallstone on CT done at [facility] two days ago.  She also reports a history of gluten sensitivity, but notes that this episode is different from her typical gluten reactions, which usually involve gas and burping.  No vomiting, diarrhea, changes in bowel movements, or excessive gas. She reports dry heaves and queasiness with food. The pain is sharp, constant,  and worsens with deep breaths.   Reviewed labs done at [facility], BUN elevated, GFR dropped , also had a low GFR on previous labs done in our office. Discussed CKI stage IIIa      Patient Active Problem List   Diagnosis Date Noted   Background diabetic retinopathy (HCC) 01/03/2023   Varicose veins with inflammation 07/16/2022   Cognitive complaints 10/27/2021   Polyp of colon    Lumbar radiculopathy 11/06/2020   Ventricular flutter (HCC) 12/06/2018   MDD (major depressive disorder), recurrent episode, mild (HCC) 09/04/2018   DDD (degenerative disc disease), cervical 01/23/2016   Degenerative joint disease of left acromioclavicular joint 01/23/2016   Recurrent oral herpes simplex infection 09/01/2015   Irritable bowel syndrome with diarrhea 08/07/2015   Statin intolerance 02/28/2015   Asthma, intermittent 09/10/2014   Controlled type 2 diabetes with neuropathy (HCC) 09/10/2014   Fibromyalgia 09/10/2014   Obstructive sleep apnea 09/10/2014   Hyperlipidemia 12/29/2012   Essential hypertension 12/29/2012   Morbid obesity (HCC) 12/29/2012   Arthritis due to gout 12/13/2007   Acid reflux 10/24/2006   Adult hypothyroidism 10/24/2006   Localized osteoarthrosis 10/24/2006    Past Surgical History:  Procedure Laterality Date   BREAST BIOPSY Left    benign   CATARACT EXTRACTION W/PHACO Left 01/18/2023   Procedure: CATARACT EXTRACTION PHACO AND INTRAOCULAR LENS PLACEMENT (IOC) LEFT DIABETIC  CLAREON VIVITY TORIC LENS;  Surgeon: Galen Manila, MD;  Location: [facility];  Service: Ophthalmology;  Laterality: Left;  4.47 0:31.9   CATARACT EXTRACTION W/PHACO Right 02/01/2023   Procedure: CATARACT EXTRACTION PHACO AND INTRAOCULAR LENS PLACEMENT (IOC) RIGHT DIABETIC  CLAREON VIVITY TORIC LENS 3.68 00:28.9;  Surgeon: Galen Manila, MD;  Location: [facility];  Service: Ophthalmology;  Laterality: Right;   COLONOSCOPY     Dr Thurmond Butts   COLONOSCOPY WITH PROPOFOL N/A 11/19/2015    Procedure: COLONOSCOPY WITH PROPOFOL;  Surgeon: Kieth Brightly, MD;  Location: [facility] ENDOSCOPY;  Service: Endoscopy;  Laterality: N/A;   COLONOSCOPY WITH PROPOFOL N/A 06/18/2021   Procedure: COLONOSCOPY WITH PROPOFOL;  Surgeon: Toney Reil, MD;  Location: [facility]COPY;  Service: Gastroenterology;  Laterality: N/A;   ESOPHAGOGASTRODUODENOSCOPY N/A 08/20/2014   Procedure: ESOPHAGOGASTRODUODENOSCOPY (EGD);  Surgeon: Kieth Brightly, MD;  Location: [facility] ENDOSCOPY;  Service: Endoscopy;  Laterality: N/A;   ESOPHAGOGASTRODUODENOSCOPY (EGD) WITH PROPOFOL N/A 06/18/2021   Procedure: ESOPHAGOGASTRODUODENOSCOPY (EGD) WITH PROPOFOL;  Surgeon: Toney Reil, MD;  Location: [facility] ENDOSCOPY;  Service: Gastroenterology;  Laterality: N/A;   FOOT SURGERY Left    heel spur   HERNIA REPAIR     umbilical   NASAL SINUS SURGERY  1980s   UMBILICAL HERNIA REPAIR      Family History  Problem Relation Age of Onset   Hypertension Mother    Hyperlipidemia Mother    Diabetes Mother    Heart disease Mother    Stroke Mother    Varicose Veins Mother    Alcohol abuse Father    Lupus Sister    Arthritis Sister    Depression Sister    Obesity Sister    Asthma Sister    Depression Sister    Hypertension Sister    Miscarriages / Stillbirths Sister    Varicose Veins Sister    Depression Sister    Arthritis Sister    Heart disease Sister    Hyperlipidemia Sister    Hypertension Sister    Miscarriages / Stillbirths Sister    Varicose Veins Sister    Miscarriages / Stillbirths Sister    Arthritis Sister    Depression Sister    Heart disease Sister    Hyperlipidemia Sister    Hypertension Sister    Heart disease Brother 32       CABG x 3    Heart attack Brother 88   Hyperlipidemia Brother    Hypertension Brother    Diabetes Brother    Hypertension Brother    Obesity Brother    Asthma Brother    Depression Brother    Hyperlipidemia Brother    Alcohol abuse Brother    Heart  disease Brother    Stroke Brother    Varicose Veins Brother    Early death Brother    Stroke Brother    Arthritis Brother    Depression Brother    Hyperlipidemia Brother    Hypertension Brother    Stroke Maternal Grandfather    Breast cancer Neg Hx     Social History   Tobacco Use   Smoking status: Never   Smokeless tobacco: Never  Substance Use Topics   Alcohol use: No     Current Outpatient Medications:    acetaminophen (TYLENOL) 650 MG CR tablet, Take 1,300 mg by mouth 2 (two) times daily., Disp: , Rfl:    albuterol (VENTOLIN HFA) 108 (90 Base) MCG/ACT inhaler, Inhale 2 puffs into the lungs every 6 (six) hours as needed for wheezing or shortness of breath., Disp: 1 each, Rfl: 2  buPROPion (WELLBUTRIN XL) 150 MG 24 hr tablet, Take 1 tablet (150 mg total) by mouth every morning., Disp: 90 tablet, Rfl: 1   DULoxetine (CYMBALTA) 60 MG capsule, Take 1 capsule (60 mg total) by mouth daily., Disp: 90 capsule, Rfl: 1   Evolocumab (REPATHA) 140 MG/ML SOSY, Inject 140 mg into the skin every 14 (fourteen) days., Disp: 6.3 mL, Rfl: 2   ezetimibe (ZETIA) 10 MG tablet, Take 1 tablet (10 mg total) by mouth daily., Disp: 90 tablet, Rfl: 1   folic acid (FOLVITE) 1 MG tablet, Take 1 tablet (1 mg total) by mouth daily., Disp: 90 tablet, Rfl: 1   hydrOXYzine (ATARAX) 10 MG tablet, Take 1 tablet (10 mg total) by mouth 3 (three) times daily as needed., Disp: 30 tablet, Rfl: 0   levothyroxine (SYNTHROID) 100 MCG tablet, TAKE 1 TABLET BY MOUTH DAILY, Disp: 90 tablet, Rfl: 1   lidocaine (LIDODERM) 5 %, Place 1 patch onto the skin daily. Remove & Discard patch within 12 hours or as directed by MD, Disp: 30 patch, Rfl: 0   metoprolol succinate (TOPROL-XL) 25 MG 24 hr tablet, Take 1 tablet (25 mg total) by mouth daily., Disp: 90 tablet, Rfl: 1   montelukast (SINGULAIR) 10 MG tablet, Take 1 tablet (10 mg total) by mouth at bedtime., Disp: 90 tablet, Rfl: 1   Multiple Vitamin (MULTIVITAMIN) tablet, Take 1  tablet by mouth daily., Disp: , Rfl:    olmesartan-hydrochlorothiazide (BENICAR HCT) 40-12.5 MG tablet, Take 1 tablet by mouth daily., Disp: 90 tablet, Rfl: 1   omeprazole (PRILOSEC) 20 MG capsule, Take 1 capsule (20 mg total) by mouth 2 (two) times daily before a meal., Disp: 180 capsule, Rfl: 1   QUEtiapine (SEROQUEL) 25 MG tablet, Take 1 tablet (25 mg total) by mouth at bedtime., Disp: 90 tablet, Rfl: 1   rOPINIRole (REQUIP) 1 MG tablet, Take 1 tablet (1 mg total) by mouth at bedtime., Disp: 90 tablet, Rfl: 0   tiZANidine (ZANAFLEX) 4 MG tablet, Take 1 tablet (4 mg total) by mouth at bedtime as needed for muscle spasms., Disp: 90 tablet, Rfl: 1  Allergies  Allergen Reactions   Vascepa [Icosapent Ethyl (Epa Ethyl Ester) (Fish)] Hives and Swelling   Aspir-81 [Aspirin] Other (See Comments)    Coagulation disorder   Gabapentin Other (See Comments)    Cognitive/memory issues   Metformin And Related     indigestion   Mounjaro [Tirzepatide] Diarrhea    Epigastric pain    Peanut-Containing Drug Products     Tongue goes numb from peanuts   Statins Other (See Comments)   Topamax [Topiramate] Other (See Comments)    Cognitive issues    Voltaren [Diclofenac Sodium] Hives and Other (See Comments)   Lipitor [Atorvastatin]     Joint pain    I personally reviewed active problem list, medication list, allergies, family history with the patient/caregiver today.   ROS  Ten systems reviewed and is negative except as mentioned in HPI    Objective  Vitals:   05/19/23 0754  BP: 110/70  Pulse: 99  Resp: 16  SpO2: 98%  Weight: 277 lb 11.2 oz (126 kg)  Height: 5\' 6"  (1.676 m)    Body mass index is 44.82 kg/m.  Physical Exam  Constitutional: Patient appears well-developed and well-nourished. Obese  No distress.  HEENT: head atraumatic, normocephalic, pupils equal and reactive to light, neck supple Cardiovascular: Normal rate, regular rhythm and normal heart sounds.  No murmur heard. No  BLE edema. Pulmonary/Chest:  Effort normal and breath sounds normal. No respiratory distress. Abdominal: Soft.  There is upper abdominal tenderness, voluntary guarding, normal bowel sounds, no masses felt Psychiatric: Patient has a normal mood and affect. behavior is normal. Judgment and thought content normal.   Recent Results (from the past 2160 hours)  POCT glycosylated hemoglobin (Hb A1C)     Status: Abnormal   Collection Time: 05/09/23  8:50 AM  Result Value Ref Range   Hemoglobin A1C 6.4 (A) 4.0 - 5.6 %   HbA1c POC (<> result, manual entry)     HbA1c, POC (prediabetic range)     HbA1c, POC (controlled diabetic range)    Lipase, blood     Status: None   Collection Time: 05/17/23  7:38 AM  Result Value Ref Range   Lipase 35 11 - 51 U/L    Comment: Performed at [facility], [address] [place], [place] [address]  Comprehensive metabolic panel     Status: Abnormal   Collection Time: 05/17/23  7:38 AM  Result Value Ref Range   Sodium 134 (L) 135 - 145 mmol/L   Potassium 4.3 3.5 - 5.1 mmol/L   Chloride 102 98 - 111 mmol/L   CO2 25 22 - 32 mmol/L   Glucose, Bld 154 (H) 70 - 99 mg/dL    Comment: Glucose reference range applies only to samples taken after fasting for at least 8 hours.   BUN 31 (H) 8 - 23 mg/dL   Creatinine, Ser 9.52 (H) 0.44 - 1.00 mg/dL   Calcium 8.9 8.9 - 84.1 mg/dL   Total Protein 7.4 6.5 - 8.1 g/dL   Albumin 3.7 3.5 - 5.0 g/dL   AST 23 15 - 41 U/L   ALT 21 0 - 44 U/L   Alkaline Phosphatase 77 38 - 126 U/L   Total Bilirubin 0.3 0.0 - 1.2 mg/dL   GFR, Estimated 58 (L) >60 mL/min    Comment: (NOTE) Calculated using the CKD-EPI Creatinine Equation (2021)    Anion gap 7 5 - 15    Comment: Performed at [facility], [address] [place], [place] [address]  CBC     Status: Abnormal   Collection Time: 05/17/23  7:38 AM  Result Value Ref Range   WBC 11.1 (H) 4.0 - 10.5 K/uL   RBC 3.89 3.87 - 5.11 MIL/uL   Hemoglobin 12.6 12.0 - 15.0  g/dL   HCT 10.2 72.5 - 36.6 %   MCV 96.7 80.0 - 100.0 fL   MCH 32.4 26.0 - 34.0 pg   MCHC 33.5 30.0 - 36.0 g/dL   RDW 44.0 34.7 - 42.5 %   Platelets 328 150 - 400 K/uL   nRBC 0.0 0.0 - 0.2 %    Comment: Performed at [facility], [address] [place], [place] [address]  Urinalysis, Routine w reflex microscopic -Urine, Clean Catch     Status: Abnormal   Collection Time: 05/17/23 11:19 AM  Result Value Ref Range   Color, Urine YELLOW (A) YELLOW   APPearance CLEAR (A) CLEAR   Specific Gravity, Urine 1.033 (H) 1.005 - 1.030   pH 5.0 5.0 - 8.0   Glucose, UA NEGATIVE NEGATIVE mg/dL   Hgb urine dipstick NEGATIVE NEGATIVE   Bilirubin Urine NEGATIVE NEGATIVE   Ketones, ur NEGATIVE NEGATIVE mg/dL   Protein, ur NEGATIVE NEGATIVE mg/dL   Nitrite NEGATIVE NEGATIVE   Leukocytes,Ua NEGATIVE NEGATIVE    Comment: Performed at [facility], [address] [place],  [place] [address]    Diabetic Foot Exam:     PHQ2/9:    05/09/2023    8:47 AM 03/31/2023    2:52 PM 01/04/2023   10:16 AM 10/08/2022    8:34 AM 09/01/2022    8:25 AM  Depression screen PHQ 2/9  Decreased Interest 1 0 1 0 1  Down, Depressed, Hopeless 1 0 1 0 1  PHQ - 2 Score 2 0 2 0 2  Altered sleeping 1 0 2 0 1  Tired, decreased energy 1 0 2 0 1  Change in appetite 0 0 1 0 0  Feeling bad or failure about yourself  0 0 0 0 0  Trouble concentrating 1 0 0 0 0  Moving slowly or fidgety/restless 0 0 0 0 0  Suicidal thoughts 0 0 0 0 0  PHQ-9 Score 5 0 7 0 4  Difficult doing work/chores Somewhat difficult Not difficult at all Somewhat difficult Not difficult at all     phq 9 is positive  Fall Risk:    05/09/2023    8:46 AM 03/31/2023    2:52 PM 01/04/2023   10:10 AM 10/08/2022    8:31 AM 09/01/2022    8:25 AM  Fall Risk   Falls in the past year? 1 0 0 1 1  Number falls in past yr: 0 0  1 0  Injury with Fall? 0 0  0 1  Risk for fall due to : Impaired balance/gait No Fall Risks Impaired balance/gait   Impaired mobility  Follow up Falls prevention discussed;Education provided;Falls evaluation completed Falls prevention discussed;Education provided;Falls evaluation completed Falls prevention discussed;Education provided;Falls evaluation completed  Falls prevention discussed     Assessment and Plan    Upper Abdominal Pain Severe, sharp, constant pain in the upper abdomen, exacerbated by deep breaths and eating. Marland Kitchen Recent ER visit with CT scan showed no significant findings. Mild leukocytosis noted. -Order right upper quadrant ultrasound to evaluate for gallstones/stat. -Order H. Pylori stool test  to evaluate for possible recurrence of infection, she takes PPI and explained risk of being false negative and if other evaluation negative she will need to see GI. -Check CBC and comprehensive metabolic panel to monitor kidney function and white blood cell count. -Prescribe Levsin (hyoscyamine) for possible gut spasms. -Prescribe Zofran for nausea. -Continue omeprazole 20mg  twice daily. -Advise patient to use Maalox as needed for additional relief. -Schedule follow-up appointment in 1 week to review results and assess response to treatment.  Chronic Kidney Disease Stage 3 GFR below 60 noted on two separate labs -Monitor kidney function with comprehensive metabolic panel.  Dehydration Mild dehydration based on elevated BUN, she states trying to stay hydrated, discussed sips of fluids throughout the day -Advise patient to maintain adequate fluid intake.   -follow up in one week

## 2023-05-20 ENCOUNTER — Other Ambulatory Visit: Payer: Self-pay | Admitting: Family Medicine

## 2023-05-20 ENCOUNTER — Encounter: Payer: Self-pay | Admitting: Family Medicine

## 2023-05-20 DIAGNOSIS — R1010 Upper abdominal pain, unspecified: Secondary | ICD-10-CM | POA: Diagnosis not present

## 2023-05-20 DIAGNOSIS — R1013 Epigastric pain: Secondary | ICD-10-CM | POA: Diagnosis not present

## 2023-05-20 DIAGNOSIS — D72825 Bandemia: Secondary | ICD-10-CM

## 2023-05-20 LAB — COMPLETE METABOLIC PANEL WITH GFR
AG Ratio: 1.4 (calc) (ref 1.0–2.5)
ALT: 22 U/L (ref 6–29)
AST: 19 U/L (ref 10–35)
Albumin: 3.7 g/dL (ref 3.6–5.1)
Alkaline phosphatase (APISO): 79 U/L (ref 37–153)
BUN/Creatinine Ratio: 19 (calc) (ref 6–22)
BUN: 20 mg/dL (ref 7–25)
CO2: 27 mmol/L (ref 20–32)
Calcium: 8.7 mg/dL (ref 8.6–10.4)
Chloride: 99 mmol/L (ref 98–110)
Creat: 1.06 mg/dL — ABNORMAL HIGH (ref 0.60–1.00)
Globulin: 2.6 g/dL (ref 1.9–3.7)
Glucose, Bld: 151 mg/dL — ABNORMAL HIGH (ref 65–99)
Potassium: 4.6 mmol/L (ref 3.5–5.3)
Sodium: 134 mmol/L — ABNORMAL LOW (ref 135–146)
Total Bilirubin: 0.8 mg/dL (ref 0.2–1.2)
Total Protein: 6.3 g/dL (ref 6.1–8.1)
eGFR: 57 mL/min/{1.73_m2} — ABNORMAL LOW (ref >=60)

## 2023-05-20 LAB — CBC WITH DIFFERENTIAL/PLATELET
Absolute Lymphocytes: 736 {cells}/uL — ABNORMAL LOW (ref 850–3900)
Absolute Monocytes: 805 {cells}/uL (ref 200–950)
Basophils Absolute: 35 {cells}/uL (ref 0–200)
Basophils Relative: 0.3 %
Eosinophils Absolute: 173 {cells}/uL (ref 15–500)
Eosinophils Relative: 1.5 %
HCT: 37.2 % (ref 35.0–45.0)
Hemoglobin: 12.4 g/dL (ref 11.7–15.5)
MCH: 31.9 pg (ref 27.0–33.0)
MCHC: 33.3 g/dL (ref 32.0–36.0)
MCV: 95.6 fL (ref 80.0–100.0)
MPV: 9.8 fL (ref 7.5–12.5)
Monocytes Relative: 7 %
Neutro Abs: 9752 {cells}/uL — ABNORMAL HIGH (ref 1500–7800)
Neutrophils Relative %: 84.8 %
Platelets: 326 10*3/uL (ref 140–400)
RBC: 3.89 10*6/uL (ref 3.80–5.10)
RDW: 13.1 % (ref 11.0–15.0)
Total Lymphocyte: 6.4 %
WBC: 11.5 10*3/uL — ABNORMAL HIGH (ref 3.8–10.8)

## 2023-05-21 ENCOUNTER — Encounter: Payer: Self-pay | Admitting: Family Medicine

## 2023-05-21 ENCOUNTER — Other Ambulatory Visit
Admission: RE | Admit: 2023-05-21 | Discharge: 2023-05-21 | Disposition: A | Discharge status: Home / Self Care | Source: Home / Self Care | Attending: Family Medicine | Admitting: Family Medicine

## 2023-05-21 DIAGNOSIS — D72825 Bandemia: Secondary | ICD-10-CM | POA: Insufficient documentation

## 2023-05-21 DIAGNOSIS — R1010 Upper abdominal pain, unspecified: Secondary | ICD-10-CM | POA: Insufficient documentation

## 2023-05-21 LAB — CBC WITH DIFFERENTIAL/PLATELET
Abs Immature Granulocytes: 0.08 10*3/uL — ABNORMAL HIGH (ref 0.00–0.07)
Basophils Absolute: 0 10*3/uL (ref 0.0–0.1)
Basophils Relative: 1 %
Eosinophils Absolute: 0.3 10*3/uL (ref 0.0–0.5)
Eosinophils Relative: 3 %
HCT: 33 % — ABNORMAL LOW (ref 36.0–46.0)
Hemoglobin: 11.1 g/dL — ABNORMAL LOW (ref 12.0–15.0)
Immature Granulocytes: 1 %
Lymphocytes Relative: 9 %
Lymphs Abs: 0.8 10*3/uL (ref 0.7–4.0)
MCH: 32.3 pg (ref 26.0–34.0)
MCHC: 33.6 g/dL (ref 30.0–36.0)
MCV: 95.9 fL (ref 80.0–100.0)
Monocytes Absolute: 0.5 10*3/uL (ref 0.1–1.0)
Monocytes Relative: 6 %
Neutro Abs: 6.9 10*3/uL (ref 1.7–7.7)
Neutrophils Relative %: 80 %
Platelets: 309 10*3/uL (ref 150–400)
RBC: 3.44 MIL/uL — ABNORMAL LOW (ref 3.87–5.11)
RDW: 13.2 % (ref 11.5–15.5)
WBC: 8.5 10*3/uL (ref 4.0–10.5)
nRBC: 0 % (ref 0.0–0.2)

## 2023-05-23 ENCOUNTER — Encounter: Payer: Self-pay | Admitting: Surgery

## 2023-05-23 ENCOUNTER — Telehealth: Payer: Self-pay | Admitting: Surgery

## 2023-05-23 ENCOUNTER — Ambulatory Visit: Admitting: Family Medicine

## 2023-05-23 ENCOUNTER — Ambulatory Visit (INDEPENDENT_AMBULATORY_CARE_PROVIDER_SITE_OTHER): Admitting: Surgery

## 2023-05-23 VITALS — BP 93/63 | HR 88 | Temp 98.2°F | Ht 66.0 in | Wt 272.0 lb

## 2023-05-23 DIAGNOSIS — K810 Acute cholecystitis: Secondary | ICD-10-CM | POA: Diagnosis not present

## 2023-05-23 DIAGNOSIS — K8000 Calculus of gallbladder with acute cholecystitis without obstruction: Secondary | ICD-10-CM

## 2023-05-23 LAB — HELICOBACTER PYLORI  SPECIAL ANTIGEN
MICRO NUMBER:: 16145200
SPECIMEN QUALITY: ADEQUATE

## 2023-05-23 NOTE — H&P (View-Only) (Signed)
 Patient ID: Sarah Gordon, female   DOB: [birth year], 73 y.o.   MRN: [id number]  HPI Sarah Gordon is a 73 y.o. female seen in consultation at the request of Dr. Carlynn Purl, case discussed with her in detail. He endorses 1 week history of intermittent abdominal pain located in the right upper quadrant.  Episodes are moderate to severe, for several hours.  They seem to be exacerbated by heavy meals.  She did go to the emergency room where a CT scan formed personally reviewed showing distended gallbladder.  She did have an ultrasound as well that I have reviewed personally showing evidence of distended gallbladder with stones.  Normal common bile over the weekend she has.  This morning she feels okay. Does have significant history including a BMI of 43, diabetes, prior helical hernia repair.  No strokes or heart attacks.  She is able to without significant shortness of breath or chest pain.  She does have a feeling history significant for GB disease, with multiple siblings requiring cholecystectomy She does have history of memory loss cognitive impairment.  She is currently competent and is able to carry a normal conversation.  Husband is with her  HPI  Past Medical History:  Diagnosis Date   Allergy    Anxiety '95   Asthma    Cataract    Depression '95   Esophageal reflux    Gouty arthropathy, unspecified    Herpes simplex without mention of complication    Irritable bowel syndrome    Localized osteoarthrosis not specified whether primary or secondary, unspecified site    Migraine, unspecified, without mention of intractable migraine without mention of status migrainosus    Mild cognitive impairment    Mixed hyperlipidemia    Myalgia and myositis, unspecified    Obstructive sleep apnea (adult) (pediatric)    Palpitations    Sleep apnea    No CPAP   Type II or unspecified type diabetes mellitus without mention of complication, not stated as uncontrolled    Diet controlled   Umbilical hernia  without mention of obstruction or gangrene    Unspecified essential hypertension    Unspecified hypothyroidism     Past Surgical History:  Procedure Laterality Date   BREAST BIOPSY Left    benign   CATARACT EXTRACTION W/PHACO Left 01/18/2023   Procedure: CATARACT EXTRACTION PHACO AND INTRAOCULAR LENS PLACEMENT (IOC) LEFT DIABETIC  CLAREON VIVITY TORIC LENS;  Surgeon: Galen Manila, MD;  Location: [facility];  Service: Ophthalmology;  Laterality: Left;  4.47 0:31.9   CATARACT EXTRACTION W/PHACO Right 02/01/2023   Procedure: CATARACT EXTRACTION PHACO AND INTRAOCULAR LENS PLACEMENT (IOC) RIGHT DIABETIC  CLAREON VIVITY TORIC LENS 3.68 00:28.9;  Surgeon: Galen Manila, MD;  Location: [facility];  Service: Ophthalmology;  Laterality: Right;   COLONOSCOPY     Dr Thurmond Butts   COLONOSCOPY WITH PROPOFOL N/A 11/19/2015   Procedure: COLONOSCOPY WITH PROPOFOL;  Surgeon: Kieth Brightly, MD;  Location: [facility] END[facility]COPY;  Service: Endoscopy;  Laterality: N/A;   COLONOSCOPY WITH PROPOFOL N/A 06/18/2021   Procedure: COLONOSCOPY WITH PROPOFOL;  Surgeon: Toney Reil, MD;  Location: [facility] ENDOSCOPY;  Service: Gastroenterology;  Laterality: N/A;   ESOPHAGOGASTRODUODENOSCOPY N/A 08/20/2014   Procedure: ESOPHAGOGASTRODUODENOSCOPY (EGD);  Surgeon: Kieth Brightly, MD;  Location: [facility] ENDOSCOPY;  Service: Endoscopy;  Laterality: N/A;   ESOPHAGOGASTRODUODENOSCOPY (EGD) WITH PROPOFOL N/A 06/18/2021   Procedure: ESOPHAGOGASTRODUODENOSCOPY (EGD) WITH PROPOFOL;  Surgeon: Toney Reil, MD;  Location: [facility] ENDOSCOPY;  Service: Gastroenterology;  Laterality: N/A;  FOOT SURGERY Left    heel spur   HERNIA REPAIR     umbilical   NASAL SINUS SURGERY  1980s   UMBILICAL HERNIA REPAIR      Family History  Problem Relation Age of Onset   Hypertension Mother    Hyperlipidemia Mother    Diabetes Mother    Heart disease Mother    Stroke Mother    Varicose Veins Mother     Alcohol abuse Father    Lupus Sister    Arthritis Sister    Depression Sister    Obesity Sister    Asthma Sister    Depression Sister    Hypertension Sister    Miscarriages / Stillbirths Sister    Varicose Veins Sister    Depression Sister    Arthritis Sister    Heart disease Sister    Hyperlipidemia Sister    Hypertension Sister    Miscarriages / Stillbirths Sister    Varicose Veins Sister    Miscarriages / Stillbirths Sister    Arthritis Sister    Depression Sister    Heart disease Sister    Hyperlipidemia Sister    Hypertension Sister    Heart disease Brother 22       CABG x 3    Heart attack Brother 36   Hyperlipidemia Brother    Hypertension Brother    Diabetes Brother    Hypertension Brother    Obesity Brother    Asthma Brother    Depression Brother    Hyperlipidemia Brother    Alcohol abuse Brother    Heart disease Brother    Stroke Brother    Varicose Veins Brother    Early death Brother    Stroke Brother    Arthritis Brother    Depression Brother    Hyperlipidemia Brother    Hypertension Brother    Stroke Maternal Grandfather    Breast cancer Neg Hx     Social History Social History   Tobacco Use   Smoking status: Never    Passive exposure: Never   Smokeless tobacco: Never  Vaping Use   Vaping status: Never Used  Substance Use Topics   Alcohol use: No   Drug use: No    Allergies  Allergen Reactions   Vascepa [Icosapent Ethyl (Epa Ethyl Ester) (Fish)] Hives and Swelling   Aspir-81 [Aspirin] Other (See Comments)    Coagulation disorder   Gabapentin Other (See Comments)    Cognitive/memory issues   Metformin And Related     indigestion   Mounjaro [Tirzepatide] Diarrhea    Epigastric pain    Peanut-Containing Drug Products     Tongue goes numb from peanuts   Statins Other (See Comments)   Topamax [Topiramate] Other (See Comments)    Cognitive issues    Voltaren [Diclofenac Sodium] Hives and Other (See Comments)   Lipitor  [Atorvastatin]     Joint pain    Current Outpatient Medications  Medication Sig Dispense Refill   acetaminophen (TYLENOL) 650 MG CR tablet Take 1,300 mg by mouth 2 (two) times daily.     albuterol (VENTOLIN HFA) 108 (90 Base) MCG/ACT inhaler Inhale 2 puffs into the lungs every 6 (six) hours as needed for wheezing or shortness of breath. 1 each 2   buPROPion (WELLBUTRIN XL) 150 MG 24 hr tablet Take 1 tablet (150 mg total) by mouth every morning. 90 tablet 1   DULoxetine (CYMBALTA) 60 MG capsule Take 1 capsule (60 mg total) by mouth daily. 90 capsule 1  ezetimibe (ZETIA) 10 MG tablet Take 1 tablet (10 mg total) by mouth daily. 90 tablet 1   folic acid (FOLVITE) 1 MG tablet Take 1 tablet (1 mg total) by mouth daily. 90 tablet 1   hydrOXYzine (ATARAX) 10 MG tablet Take 1 tablet (10 mg total) by mouth 3 (three) times daily as needed. 30 tablet 0   hyoscyamine (LEVSIN/SL) 0.125 MG SL tablet Place 1 tablet (0.125 mg total) under the tongue every 4 (four) hours as needed. 40 tablet 0   levothyroxine (SYNTHROID) 100 MCG tablet TAKE 1 TABLET BY MOUTH DAILY 90 tablet 1   metoprolol succinate (TOPROL-XL) 25 MG 24 hr tablet Take 1 tablet (25 mg total) by mouth daily. 90 tablet 1   montelukast (SINGULAIR) 10 MG tablet Take 1 tablet (10 mg total) by mouth at bedtime. 90 tablet 1   Multiple Vitamin (MULTIVITAMIN) tablet Take 1 tablet by mouth daily.     olmesartan-hydrochlorothiazide (BENICAR HCT) 40-12.5 MG tablet Take 1 tablet by mouth daily. 90 tablet 1   omeprazole (PRILOSEC) 20 MG capsule Take 1 capsule (20 mg total) by mouth 2 (two) times daily before a meal. 180 capsule 1   ondansetron (ZOFRAN) 4 MG tablet Take 1 tablet (4 mg total) by mouth every 8 (eight) hours as needed for nausea or vomiting. 20 tablet 0   QUEtiapine (SEROQUEL) 25 MG tablet Take 1 tablet (25 mg total) by mouth at bedtime. 90 tablet 1   rOPINIRole (REQUIP) 1 MG tablet Take 1 tablet (1 mg total) by mouth at bedtime. 90 tablet 0    tiZANidine (ZANAFLEX) 4 MG tablet Take 1 tablet (4 mg total) by mouth at bedtime as needed for muscle spasms. 90 tablet 1   Evolocumab (REPATHA) 140 MG/ML SOSY Inject 140 mg into the skin every 14 (fourteen) days. (Patient not taking: Reported on 05/23/2023) 6.3 mL 2   No current facility-administered medications for this visit.     Review of Systems Full ROS  was asked and was negative except for the information on the HPI  Physical Exam Blood pressure 93/63, pulse 88, temperature 98.2 F (36.8 C), height 5\' 6"  (1.676 m), weight 272 lb (123.4 kg), SpO2 98%. CONSTITUTIONAL: NAD. EYES: Pupils are equal, round, Sclera are non-icteric. EARS, NOSE, MOUTH AND THROAT: The oropharynx is clear. The oral mucosa is pink and moist. Hearing is intact to voice. LYMPH NODES:  Lymph nodes in the neck are normal. RESPIRATORY:  Lungs are clear. There is normal respiratory effort, with equal breath sounds bilaterally, and without pathologic use of accessory muscles. CARDIOVASCULAR: Heart is regular without murmurs, gallops, or rubs. GI: The abdomen is  soft, TTP RUQ w + Murphy sign There are no palpable masses. There is no hepatosplenomegaly. There are normal bowel sounds GU: Rectal deferred.   MUSCULOSKELETAL: Normal muscle strength and tone. No cyanosis or edema.   SKIN: Turgor is good and there are no pathologic skin lesions or ulcers. NEUROLOGIC: Motor and sensation is grossly normal. Cranial nerves are grossly intact. PSYCH:  Oriented to person, place and time. Affect is normal.  Data Reviewed  I have personally reviewed the patient's imaging, laboratory findings and medical records.    Assessment/Plan 73 year old female with classic signs and symptoms consistent with acute cholecystitis.  Discussed with patient detail about her disease process. I Definitely recommend prompt cholecystectomy ,I think we will be able to do it tomorrow. I do not necessarily thinks she need hospitalization but d/w her if  her conditions worsens she  may need to be admitted. She understands. The risks, benefits, complications, treatment options, and expected outcomes were discussed with the patient. The possibilities of bleeding, recurrent infection, finding a normal gallbladder, perforation of viscus organs, damage to surrounding structures, bile leak, abscess formation, needing a drain placed, the need for additional procedures, reaction to medication, pulmonary aspiration,  failure to diagnose a condition, the possible need to convert to an open procedure, and creating a complication requiring transfusion or operation were discussed with the patient. The patient and/or family concurred with the proposed plan, giving informed consent.  Please note that I have sent 60 minutes on this encounter including personally studies, records, coordinating care, placing orders, counseling the patient and performing documentation.  Sterling Big, MD FACS General Surgeon 05/23/2023, 10:22 AM

## 2023-05-23 NOTE — Progress Notes (Signed)
 Patient ID: Sarah Gordon, female   DOB: [birth year], 73 y.o.   MRN: [id number]  HPI Sarah Gordon is a 73 y.o. female seen in consultation at the request of Dr. Carlynn Purl, case discussed with her in detail. He endorses 1 week history of intermittent abdominal pain located in the right upper quadrant.  Episodes are moderate to severe, for several hours.  They seem to be exacerbated by heavy meals.  She did go to the emergency room where a CT scan formed personally reviewed showing distended gallbladder.  She did have an ultrasound as well that I have reviewed personally showing evidence of distended gallbladder with stones.  Normal common bile over the weekend she has.  This morning she feels okay. Does have significant history including a BMI of 43, diabetes, prior helical hernia repair.  No strokes or heart attacks.  She is able to without significant shortness of breath or chest pain.  She does have a feeling history significant for GB disease, with multiple siblings requiring cholecystectomy She does have history of memory loss cognitive impairment.  She is currently competent and is able to carry a normal conversation.  Husband is with her  HPI  Past Medical History:  Diagnosis Date   Allergy    Anxiety '95   Asthma    Cataract    Depression '95   Esophageal reflux    Gouty arthropathy, unspecified    Herpes simplex without mention of complication    Irritable bowel syndrome    Localized osteoarthrosis not specified whether primary or secondary, unspecified site    Migraine, unspecified, without mention of intractable migraine without mention of status migrainosus    Mild cognitive impairment    Mixed hyperlipidemia    Myalgia and myositis, unspecified    Obstructive sleep apnea (adult) (pediatric)    Palpitations    Sleep apnea    No CPAP   Type II or unspecified type diabetes mellitus without mention of complication, not stated as uncontrolled    Diet controlled   Umbilical hernia  without mention of obstruction or gangrene    Unspecified essential hypertension    Unspecified hypothyroidism     Past Surgical History:  Procedure Laterality Date   BREAST BIOPSY Left    benign   CATARACT EXTRACTION W/PHACO Left 01/18/2023   Procedure: CATARACT EXTRACTION PHACO AND INTRAOCULAR LENS PLACEMENT (IOC) LEFT DIABETIC  CLAREON VIVITY TORIC LENS;  Surgeon: Galen Manila, MD;  Location: [facility];  Service: Ophthalmology;  Laterality: Left;  4.47 0:31.9   CATARACT EXTRACTION W/PHACO Right 02/01/2023   Procedure: CATARACT EXTRACTION PHACO AND INTRAOCULAR LENS PLACEMENT (IOC) RIGHT DIABETIC  CLAREON VIVITY TORIC LENS 3.68 00:28.9;  Surgeon: Galen Manila, MD;  Location: [facility];  Service: Ophthalmology;  Laterality: Right;   COLONOSCOPY     Dr Thurmond Butts   COLONOSCOPY WITH PROPOFOL N/A 11/19/2015   Procedure: COLONOSCOPY WITH PROPOFOL;  Surgeon: Kieth Brightly, MD;  Location: [facility] ENDOSCOPY;  Service: Endoscopy;  Laterality: N/A;   COLONOSCOPY WITH PROPOFOL N/A 06/18/2021   Procedure: COLONOSCOPY WITH PROPOFOL;  Surgeon: Toney Reil, MD;  Location: [facility] END[facility];  Service: Gastroenterology;  Laterality: N/A;   ESOPHAGOGASTRODUODENOSCOPY N/A 08/20/2014   Procedure: ESOPHAGOGASTRODUODENOSCOPY (EGD);  Surgeon: Kieth Brightly, MD;  Location: [facility] END[facility]COPY;  Service: Endoscopy;  Laterality: N/A;   ESOPHAGOGASTRODUODENOSCOPY (EGD) WITH PROPOFOL N/A 06/18/2021   Procedure: ESOPHAGOGASTRODUODENOSCOPY (EGD) WITH PROPOFOL;  Surgeon: Toney Reil, MD;  Location: [facility] ENDOSCOPY;  Service: Gastroenterology;  Laterality: N/A;  FOOT SURGERY Left    heel spur   HERNIA REPAIR     umbilical   NASAL SINUS SURGERY  1980s   UMBILICAL HERNIA REPAIR      Family History  Problem Relation Age of Onset   Hypertension Mother    Hyperlipidemia Mother    Diabetes Mother    Heart disease Mother    Stroke Mother    Varicose Veins Mother     Alcohol abuse Father    Lupus Sister    Arthritis Sister    Depression Sister    Obesity Sister    Asthma Sister    Depression Sister    Hypertension Sister    Miscarriages / Stillbirths Sister    Varicose Veins Sister    Depression Sister    Arthritis Sister    Heart disease Sister    Hyperlipidemia Sister    Hypertension Sister    Miscarriages / Stillbirths Sister    Varicose Veins Sister    Miscarriages / Stillbirths Sister    Arthritis Sister    Depression Sister    Heart disease Sister    Hyperlipidemia Sister    Hypertension Sister    Heart disease Brother 22       CABG x 3    Heart attack Brother 36   Hyperlipidemia Brother    Hypertension Brother    Diabetes Brother    Hypertension Brother    Obesity Brother    Asthma Brother    Depression Brother    Hyperlipidemia Brother    Alcohol abuse Brother    Heart disease Brother    Stroke Brother    Varicose Veins Brother    Early death Brother    Stroke Brother    Arthritis Brother    Depression Brother    Hyperlipidemia Brother    Hypertension Brother    Stroke Maternal Grandfather    Breast cancer Neg Hx     Social History Social History   Tobacco Use   Smoking status: Never    Passive exposure: Never   Smokeless tobacco: Never  Vaping Use   Vaping status: Never Used  Substance Use Topics   Alcohol use: No   Drug use: No    Allergies  Allergen Reactions   Vascepa [Icosapent Ethyl (Epa Ethyl Ester) (Fish)] Hives and Swelling   Aspir-81 [Aspirin] Other (See Comments)    Coagulation disorder   Gabapentin Other (See Comments)    Cognitive/memory issues   Metformin And Related     indigestion   Mounjaro [Tirzepatide] Diarrhea    Epigastric pain    Peanut-Containing Drug Products     Tongue goes numb from peanuts   Statins Other (See Comments)   Topamax [Topiramate] Other (See Comments)    Cognitive issues    Voltaren [Diclofenac Sodium] Hives and Other (See Comments)   Lipitor  [Atorvastatin]     Joint pain    Current Outpatient Medications  Medication Sig Dispense Refill   acetaminophen (TYLENOL) 650 MG CR tablet Take 1,300 mg by mouth 2 (two) times daily.     albuterol (VENTOLIN HFA) 108 (90 Base) MCG/ACT inhaler Inhale 2 puffs into the lungs every 6 (six) hours as needed for wheezing or shortness of breath. 1 each 2   buPROPion (WELLBUTRIN XL) 150 MG 24 hr tablet Take 1 tablet (150 mg total) by mouth every morning. 90 tablet 1   DULoxetine (CYMBALTA) 60 MG capsule Take 1 capsule (60 mg total) by mouth daily. 90 capsule 1  ezetimibe (ZETIA) 10 MG tablet Take 1 tablet (10 mg total) by mouth daily. 90 tablet 1   folic acid (FOLVITE) 1 MG tablet Take 1 tablet (1 mg total) by mouth daily. 90 tablet 1   hydrOXYzine (ATARAX) 10 MG tablet Take 1 tablet (10 mg total) by mouth 3 (three) times daily as needed. 30 tablet 0   hyoscyamine (LEVSIN/SL) 0.125 MG SL tablet Place 1 tablet (0.125 mg total) under the tongue every 4 (four) hours as needed. 40 tablet 0   levothyroxine (SYNTHROID) 100 MCG tablet TAKE 1 TABLET BY MOUTH DAILY 90 tablet 1   metoprolol succinate (TOPROL-XL) 25 MG 24 hr tablet Take 1 tablet (25 mg total) by mouth daily. 90 tablet 1   montelukast (SINGULAIR) 10 MG tablet Take 1 tablet (10 mg total) by mouth at bedtime. 90 tablet 1   Multiple Vitamin (MULTIVITAMIN) tablet Take 1 tablet by mouth daily.     olmesartan-hydrochlorothiazide (BENICAR HCT) 40-12.5 MG tablet Take 1 tablet by mouth daily. 90 tablet 1   omeprazole (PRILOSEC) 20 MG capsule Take 1 capsule (20 mg total) by mouth 2 (two) times daily before a meal. 180 capsule 1   ondansetron (ZOFRAN) 4 MG tablet Take 1 tablet (4 mg total) by mouth every 8 (eight) hours as needed for nausea or vomiting. 20 tablet 0   QUEtiapine (SEROQUEL) 25 MG tablet Take 1 tablet (25 mg total) by mouth at bedtime. 90 tablet 1   rOPINIRole (REQUIP) 1 MG tablet Take 1 tablet (1 mg total) by mouth at bedtime. 90 tablet 0    tiZANidine (ZANAFLEX) 4 MG tablet Take 1 tablet (4 mg total) by mouth at bedtime as needed for muscle spasms. 90 tablet 1   Evolocumab (REPATHA) 140 MG/ML SOSY Inject 140 mg into the skin every 14 (fourteen) days. (Patient not taking: Reported on 05/23/2023) 6.3 mL 2   No current facility-administered medications for this visit.     Review of Systems Full ROS  was asked and was negative except for the information on the HPI  Physical Exam Blood pressure 93/63, pulse 88, temperature 98.2 F (36.8 C), height 5\' 6"  (1.676 m), weight 272 lb (123.4 kg), SpO2 98%. CONSTITUTIONAL: NAD. EYES: Pupils are equal, round, Sclera are non-icteric. EARS, NOSE, MOUTH AND THROAT: The oropharynx is clear. The oral mucosa is pink and moist. Hearing is intact to voice. LYMPH NODES:  Lymph nodes in the neck are normal. RESPIRATORY:  Lungs are clear. There is normal respiratory effort, with equal breath sounds bilaterally, and without pathologic use of accessory muscles. CARDIOVASCULAR: Heart is regular without murmurs, gallops, or rubs. GI: The abdomen is  soft, TTP RUQ w + Murphy sign There are no palpable masses. There is no hepatosplenomegaly. There are normal bowel sounds GU: Rectal deferred.   MUSCULOSKELETAL: Normal muscle strength and tone. No cyanosis or edema.   SKIN: Turgor is good and there are no pathologic skin lesions or ulcers. NEUROLOGIC: Motor and sensation is grossly normal. Cranial nerves are grossly intact. PSYCH:  Oriented to person, place and time. Affect is normal.  Data Reviewed  I have personally reviewed the patient's imaging, laboratory findings and medical records.    Assessment/Plan 73 year old female with classic signs and symptoms consistent with acute cholecystitis.  Discussed with patient detail about her disease process. I Definitely recommend prompt cholecystectomy ,I think we will be able to do it tomorrow. I do not necessarily thinks she need hospitalization but d/w her if  her conditions worsens she  may need to be admitted. She understands. The risks, benefits, complications, treatment options, and expected outcomes were discussed with the patient. The possibilities of bleeding, recurrent infection, finding a normal gallbladder, perforation of viscus organs, damage to surrounding structures, bile leak, abscess formation, needing a drain placed, the need for additional procedures, reaction to medication, pulmonary aspiration,  failure to diagnose a condition, the possible need to convert to an open procedure, and creating a complication requiring transfusion or operation were discussed with the patient. The patient and/or family concurred with the proposed plan, giving informed consent.  Please note that I have sent 60 minutes on this encounter including personally studies, records, coordinating care, placing orders, counseling the patient and performing documentation.  Sterling Big, MD FACS General Surgeon 05/23/2023, 10:22 AM

## 2023-05-23 NOTE — Telephone Encounter (Signed)
 Patient has been advised of Pre-Admission date/time, and Surgery date at [facility].  Surgery Date: 05/24/23 Preadmission Testing Date: 05/24/23 Patient to arrive 2 hrs early, arrive at 12:00 at [facility].    Patient reminded to be NPO after midnight.

## 2023-05-23 NOTE — Patient Instructions (Addendum)
 Do Not Eat anything after Midnight tonight and nothing to drink in the morning either. You may drink Pedialyte for fluid intake.   You have requested to have your gallbladder removed. This will be done at [facility] with Dr. Everlene Farrier.  You will most likely be out of work 1-2 weeks for this surgery.  If you have FMLA or disability paperwork that needs filled out you may drop this off at our office or this can be faxed to [phone].  You will return after your post-op appointment with a lifting restriction for approximately 4 more weeks.  You will be able to eat anything you would like to following surgery. But, start by eating a bland diet and advance this as tolerated. The Gallbladder diet is below, please go as closely by this diet as possible prior to surgery to avoid any further attacks.  Please see the (blue)pre-care form that you have been given today. Our surgery scheduler will call you to verify surgery date and to go over information.   If you have any questions, please call our office.  Laparoscopic Cholecystectomy Laparoscopic cholecystectomy is surgery to remove the gallbladder. The gallbladder is located in the upper right part of the abdomen, behind the liver. It is a storage sac for bile, which is produced in the liver. Bile aids in the digestion and absorption of fats. Cholecystectomy is often done for inflammation of the gallbladder (cholecystitis). This condition is usually caused by a buildup of gallstones (cholelithiasis) in the gallbladder. Gallstones can block the flow of bile, and that can result in inflammation and pain. In severe cases, emergency surgery may be required. If emergency surgery is not required, you will have time to prepare for the procedure. Laparoscopic surgery is an alternative to open surgery. Laparoscopic surgery has a shorter recovery time. Your common bile duct may also need to be examined during the procedure. If stones are found in the common  bile duct, they may be removed. LET [facility] CARE PROVIDER KNOW ABOUT: Any allergies you have. All medicines you are taking, including vitamins, herbs, eye drops, creams, and over-the-counter medicines. Previous problems you or members of your family have had with the use of anesthetics. Any blood disorders you have. Previous surgeries you have had.  Any medical conditions you have. RISKS AND COMPLICATIONS Generally, this is a safe procedure. However, problems may occur, including: Infection. Bleeding. Allergic reactions to medicines. Damage to other structures or organs. A stone remaining in the common bile duct. A bile leak from the cyst duct that is clipped when your gallbladder is removed. The need to convert to open surgery, which requires a larger incision in the abdomen. This may be necessary if your surgeon thinks that it is not safe to continue with a laparoscopic procedure. BEFORE THE PROCEDURE Ask your health care provider about: Changing or stopping your regular medicines. This is especially important if you are taking diabetes medicines or blood thinners. Taking medicines such as aspirin and ibuprofen. These medicines can thin your blood. Do not take these medicines before your procedure if your health care provider instructs you not to. Follow instructions from your health care provider about eating or drinking restrictions. Let your health care provider know if you develop a cold or an infection before surgery. Plan to have someone take you home after the procedure. Ask your health care provider how your surgical site will be marked or identified. You may be given antibiotic medicine to help prevent infection. PROCEDURE To  reduce your risk of infection: Your health care team will wash or sanitize their hands. Your skin will be washed with soap. An IV tube may be inserted into one of your veins. You will be given a medicine to make you fall asleep (general  anesthetic). A breathing tube will be placed in your mouth. The surgeon will make several small cuts (incisions) in your abdomen. A thin, lighted tube (laparoscope) that has a tiny camera on the end will be inserted through one of the small incisions. The camera on the laparoscope will send a picture to a TV screen (monitor) in the operating room. This will give the surgeon a good view inside your abdomen. A gas will be pumped into your abdomen. This will expand your abdomen to give the surgeon more room to perform the surgery. Other tools that are needed for the procedure will be inserted through the other incisions. The gallbladder will be removed through one of the incisions. After your gallbladder has been removed, the incisions will be closed with stitches (sutures), staples, or skin glue. Your incisions may be covered with a bandage (dressing). The procedure may vary among health care providers and hospitals. AFTER THE PROCEDURE Your blood pressure, heart rate, breathing rate, and blood oxygen level will be monitored often until the medicines you were given have worn off. You will be given medicines as needed to control your pain.   This information is not intended to replace advice given to you by your health care provider. Make sure you discuss any questions you have with your health care provider.   Document Released: 03/08/2005 Document Revised: 11/27/2014 Document Reviewed: 10/18/2012 Elsevier Interactive Patient Education 2016 [employer] Inc[employer]   Low-Fat Diet for Gallbladder Conditions A low-fat diet can be helpful if you have pancreatitis or a gallbladder condition. With these conditions, your pancreas and gallbladder have trouble digesting fats. A healthy eating plan with less fat will help rest your pancreas and gallbladder and reduce your symptoms. WHAT DO I NEED TO KNOW ABOUT THIS DIET? Eat a low-fat diet. Reduce your fat intake to less than 20-30% of your total daily calories.  This is less than 50-60 g of fat per day. Remember that you need some fat in your diet. Ask your dietician what your daily goal should be. Choose nonfat and low-fat healthy foods. Look for the words "nonfat," "low fat," or "fat free." As a guide, look on the label and choose foods with less than 3 g of fat per serving. Eat only one serving. Avoid alcohol. Do not smoke. If you need help quitting, talk with your health care provider. Eat small frequent meals instead of three large heavy meals. WHAT FOODS CAN I EAT? Grains Include healthy grains and starches such as potatoes, wheat bread, fiber-rich cereal, and brown rice. Choose whole grain options whenever possible. In adults, whole grains should account for 45-65% of your daily calories.  Fruits and Vegetables Eat plenty of fruits and vegetables. Fresh fruits and vegetables add fiber to your diet. Meats and Other Protein Sources Eat lean meat such as chicken and pork. Trim any fat off of meat before cooking it. Eggs, fish, and beans are other sources of protein. In adults, these foods should account for 10-35% of your daily calories. Dairy Choose low-fat milk and dairy options. Dairy includes fat and protein, as well as calcium.  Fats and Oils Limit high-fat foods such as fried foods, sweets, baked goods, sugary drinks.  Other Creamy sauces and condiments, such  as mayonnaise, can add extra fat. Think about whether or not you need to use them, or use smaller amounts or low fat options. WHAT FOODS ARE NOT RECOMMENDED? High fat foods, such as: [employer]. Ice cream. [place] toast. Sweet rolls. Pizza. Cheese bread. Foods covered with batter, butter, creamy sauces, or cheese. Fried foods. Sugary drinks and desserts. Foods that cause gas or bloating   This information is not intended to replace advice given to you by your health care provider. Make sure you discuss any questions you have with your health care provider.   Document  Released: 03/13/2013 Document Reviewed: 03/13/2013 Elsevier Interactive Patient Education [employer]

## 2023-05-24 ENCOUNTER — Other Ambulatory Visit: Payer: Self-pay

## 2023-05-24 ENCOUNTER — Ambulatory Visit: Admitting: Anesthesiology

## 2023-05-24 ENCOUNTER — Encounter: Payer: Self-pay | Admitting: Surgery

## 2023-05-24 ENCOUNTER — Inpatient Hospital Stay
Admission: RE | Admit: 2023-05-24 | Discharge: 2023-05-26 | DRG: 418 | Disposition: A | Discharge status: Home / Self Care | Attending: Surgery | Admitting: Surgery

## 2023-05-24 ENCOUNTER — Encounter
Admission: RE | Disposition: A | Discharge status: Home / Self Care | Payer: Self-pay | Source: Home / Self Care | Attending: Surgery

## 2023-05-24 DIAGNOSIS — K8000 Calculus of gallbladder with acute cholecystitis without obstruction: Secondary | ICD-10-CM

## 2023-05-24 DIAGNOSIS — Z8249 Family history of ischemic heart disease and other diseases of the circulatory system: Secondary | ICD-10-CM

## 2023-05-24 DIAGNOSIS — Z8261 Family history of arthritis: Secondary | ICD-10-CM

## 2023-05-24 DIAGNOSIS — N179 Acute kidney failure, unspecified: Secondary | ICD-10-CM | POA: Diagnosis present

## 2023-05-24 DIAGNOSIS — E039 Hypothyroidism, unspecified: Secondary | ICD-10-CM | POA: Diagnosis present

## 2023-05-24 DIAGNOSIS — K828 Other specified diseases of gallbladder: Secondary | ICD-10-CM | POA: Diagnosis present

## 2023-05-24 DIAGNOSIS — E782 Mixed hyperlipidemia: Secondary | ICD-10-CM | POA: Diagnosis present

## 2023-05-24 DIAGNOSIS — Z8269 Family history of other diseases of the musculoskeletal system and connective tissue: Secondary | ICD-10-CM | POA: Diagnosis not present

## 2023-05-24 DIAGNOSIS — Z91048 Other nonmedicinal substance allergy status: Secondary | ICD-10-CM | POA: Diagnosis not present

## 2023-05-24 DIAGNOSIS — E871 Hypo-osmolality and hyponatremia: Secondary | ICD-10-CM | POA: Diagnosis present

## 2023-05-24 DIAGNOSIS — Z6841 Body Mass Index (BMI) 40.0 and over, adult: Secondary | ICD-10-CM

## 2023-05-24 DIAGNOSIS — E119 Type 2 diabetes mellitus without complications: Secondary | ICD-10-CM | POA: Diagnosis present

## 2023-05-24 DIAGNOSIS — Z91010 Allergy to peanuts: Secondary | ICD-10-CM

## 2023-05-24 DIAGNOSIS — I10 Essential (primary) hypertension: Secondary | ICD-10-CM | POA: Diagnosis present

## 2023-05-24 DIAGNOSIS — Z83438 Family history of other disorder of lipoprotein metabolism and other lipidemia: Secondary | ICD-10-CM

## 2023-05-24 DIAGNOSIS — K219 Gastro-esophageal reflux disease without esophagitis: Secondary | ICD-10-CM | POA: Diagnosis present

## 2023-05-24 DIAGNOSIS — J45909 Unspecified asthma, uncomplicated: Secondary | ICD-10-CM | POA: Diagnosis present

## 2023-05-24 DIAGNOSIS — Z825 Family history of asthma and other chronic lower respiratory diseases: Secondary | ICD-10-CM | POA: Diagnosis not present

## 2023-05-24 DIAGNOSIS — Z818 Family history of other mental and behavioral disorders: Secondary | ICD-10-CM | POA: Diagnosis not present

## 2023-05-24 DIAGNOSIS — Z888 Allergy status to other drugs, medicaments and biological substances status: Secondary | ICD-10-CM | POA: Diagnosis not present

## 2023-05-24 DIAGNOSIS — Z79890 Hormone replacement therapy (postmenopausal): Secondary | ICD-10-CM | POA: Diagnosis not present

## 2023-05-24 DIAGNOSIS — Z811 Family history of alcohol abuse and dependence: Secondary | ICD-10-CM | POA: Diagnosis not present

## 2023-05-24 DIAGNOSIS — Z823 Family history of stroke: Secondary | ICD-10-CM

## 2023-05-24 DIAGNOSIS — E66813 Obesity, class 3: Secondary | ICD-10-CM | POA: Diagnosis present

## 2023-05-24 DIAGNOSIS — K82A1 Gangrene of gallbladder in cholecystitis: Secondary | ICD-10-CM | POA: Diagnosis present

## 2023-05-24 DIAGNOSIS — Z886 Allergy status to analgesic agent status: Secondary | ICD-10-CM | POA: Diagnosis not present

## 2023-05-24 DIAGNOSIS — Z833 Family history of diabetes mellitus: Secondary | ICD-10-CM

## 2023-05-24 DIAGNOSIS — F419 Anxiety disorder, unspecified: Secondary | ICD-10-CM | POA: Diagnosis present

## 2023-05-24 DIAGNOSIS — E1140 Type 2 diabetes mellitus with diabetic neuropathy, unspecified: Principal | ICD-10-CM

## 2023-05-24 DIAGNOSIS — K810 Acute cholecystitis: Secondary | ICD-10-CM | POA: Diagnosis present

## 2023-05-24 DIAGNOSIS — Z79899 Other long term (current) drug therapy: Secondary | ICD-10-CM

## 2023-05-24 LAB — GLUCOSE, CAPILLARY
Glucose-Capillary: 170 mg/dL — ABNORMAL HIGH (ref 70–99)
Glucose-Capillary: 187 mg/dL — ABNORMAL HIGH (ref 70–99)

## 2023-05-24 SURGERY — CHOLECYSTECTOMY, ROBOT-ASSISTED, LAPAROSCOPIC
Anesthesia: General

## 2023-05-24 MED ORDER — INSULIN ASPART 100 UNIT/ML IJ SOLN
6.0000 [IU] | Freq: Three times a day (TID) | INTRAMUSCULAR | Status: DC
  Administered 2023-05-25 – 2023-05-26 (×4): 6 [IU] via SUBCUTANEOUS
  Filled 2023-05-24 (×4): qty 1

## 2023-05-24 MED ORDER — HYDRALAZINE HCL 20 MG/ML IJ SOLN: 10.0000 mg | INTRAMUSCULAR | Status: DC | PRN

## 2023-05-24 MED ORDER — METOPROLOL SUCCINATE ER 25 MG PO TB24
25.0000 mg | ORAL_TABLET | Freq: Every day | ORAL | Status: DC
  Administered 2023-05-24: 25 mg via ORAL
  Filled 2023-05-24: qty 1

## 2023-05-24 MED ORDER — ONDANSETRON HCL 4 MG/2ML IJ SOLN
INTRAMUSCULAR | Status: DC | PRN
  Administered 2023-05-24: 4 mg via INTRAVENOUS

## 2023-05-24 MED ORDER — INDOCYANINE GREEN 25 MG IV SOLR
1.2500 mg | Freq: Once | INTRAVENOUS | Status: AC
  Administered 2023-05-24: 1.25 mg via INTRAVENOUS

## 2023-05-24 MED ORDER — ORAL CARE MOUTH RINSE: 15.0000 mL | OROMUCOSAL | Status: DC | PRN

## 2023-05-24 MED ORDER — ALBUMIN HUMAN 5 % IV SOLN: INTRAVENOUS | Status: DC | PRN

## 2023-05-24 MED ORDER — ACETAMINOPHEN 500 MG PO TABS
1000.0000 mg | ORAL_TABLET | ORAL | Status: AC
  Administered 2023-05-24: 1000 mg via ORAL

## 2023-05-24 MED ORDER — OXYCODONE HCL 5 MG/5ML PO SOLN: 5.0000 mg | Freq: Once | ORAL | Status: AC | PRN

## 2023-05-24 MED ORDER — BUPROPION HCL ER (XL) 150 MG PO TB24
150.0000 mg | ORAL_TABLET | Freq: Every morning | ORAL | Status: DC
  Administered 2023-05-25 – 2023-05-26 (×2): 150 mg via ORAL
  Filled 2023-05-24 (×2): qty 1

## 2023-05-24 MED ORDER — CHLORHEXIDINE GLUCONATE CLOTH 2 % EX PADS: 6.0000 | MEDICATED_PAD | Freq: Once | CUTANEOUS | Status: DC

## 2023-05-24 MED ORDER — CHLORHEXIDINE GLUCONATE CLOTH 2 % EX PADS
6.0000 | MEDICATED_PAD | Freq: Once | CUTANEOUS | Status: AC
  Administered 2023-05-24: 6 via TOPICAL

## 2023-05-24 MED ORDER — PIPERACILLIN-TAZOBACTAM 3.375 G IVPB
INTRAVENOUS | Status: AC
  Filled 2023-05-24: qty 50

## 2023-05-24 MED ORDER — SUGAMMADEX SODIUM 200 MG/2ML IV SOLN
INTRAVENOUS | Status: AC
  Filled 2023-05-24: qty 2

## 2023-05-24 MED ORDER — DIPHENHYDRAMINE HCL 12.5 MG/5ML PO ELIX
12.5000 mg | ORAL_SOLUTION | Freq: Four times a day (QID) | ORAL | Status: DC | PRN
  Administered 2023-05-25: 12.5 mg via ORAL
  Filled 2023-05-24: qty 5

## 2023-05-24 MED ORDER — PROPOFOL 10 MG/ML IV BOLUS
INTRAVENOUS | Status: AC
  Filled 2023-05-24: qty 20

## 2023-05-24 MED ORDER — LIDOCAINE HCL (CARDIAC) PF 100 MG/5ML IV SOSY
PREFILLED_SYRINGE | INTRAVENOUS | Status: DC | PRN
  Administered 2023-05-24: 70 mg via INTRAVENOUS

## 2023-05-24 MED ORDER — DEXMEDETOMIDINE HCL IN NACL 80 MCG/20ML IV SOLN
INTRAVENOUS | Status: DC | PRN
  Administered 2023-05-24: 4 ug via INTRAVENOUS
  Administered 2023-05-24: 8 ug via INTRAVENOUS

## 2023-05-24 MED ORDER — ALBUMIN HUMAN 5 % IV SOLN
INTRAVENOUS | Status: AC
  Filled 2023-05-24: qty 250

## 2023-05-24 MED ORDER — ONDANSETRON 4 MG PO TBDP: 4.0000 mg | ORAL_TABLET | Freq: Four times a day (QID) | ORAL | Status: DC | PRN

## 2023-05-24 MED ORDER — PHENYLEPHRINE 80 MCG/ML (10ML) SYRINGE FOR IV PUSH (FOR BLOOD PRESSURE SUPPORT)
PREFILLED_SYRINGE | INTRAVENOUS | Status: AC
  Filled 2023-05-24: qty 10

## 2023-05-24 MED ORDER — LIDOCAINE HCL (PF) 2 % IJ SOLN
INTRAMUSCULAR | Status: AC
  Filled 2023-05-24: qty 5

## 2023-05-24 MED ORDER — QUETIAPINE FUMARATE 25 MG PO TABS
25.0000 mg | ORAL_TABLET | Freq: Every day | ORAL | Status: DC
  Administered 2023-05-24 – 2023-05-25 (×2): 25 mg via ORAL
  Filled 2023-05-24 (×2): qty 1

## 2023-05-24 MED ORDER — SODIUM CHLORIDE 0.9 % IR SOLN
Status: DC | PRN
  Administered 2023-05-24: 700 mL

## 2023-05-24 MED ORDER — PIPERACILLIN-TAZOBACTAM 3.375 G IVPB 30 MIN
3.3750 g | Freq: Once | INTRAVENOUS | Status: AC
  Administered 2023-05-24: 3.375 g via INTRAVENOUS

## 2023-05-24 MED ORDER — ONDANSETRON HCL 4 MG/2ML IJ SOLN: 4.0000 mg | Freq: Four times a day (QID) | INTRAMUSCULAR | Status: DC | PRN

## 2023-05-24 MED ORDER — LACTATED RINGERS IV SOLN: INTRAVENOUS | Status: DC | PRN

## 2023-05-24 MED ORDER — DEXAMETHASONE SODIUM PHOSPHATE 10 MG/ML IJ SOLN
INTRAMUSCULAR | Status: AC
  Filled 2023-05-24: qty 1

## 2023-05-24 MED ORDER — BUPIVACAINE-EPINEPHRINE (PF) 0.25% -1:200000 IJ SOLN
INTRAMUSCULAR | Status: AC
  Filled 2023-05-24: qty 30

## 2023-05-24 MED ORDER — ACETAMINOPHEN 500 MG PO TABS
1000.0000 mg | ORAL_TABLET | Freq: Four times a day (QID) | ORAL | Status: DC
Start: 2023-05-24 — End: 2023-05-26
  Administered 2023-05-24 – 2023-05-26 (×6): 1000 mg via ORAL
  Filled 2023-05-24 (×7): qty 2

## 2023-05-24 MED ORDER — PANTOPRAZOLE SODIUM 40 MG PO TBEC
40.0000 mg | DELAYED_RELEASE_TABLET | Freq: Every day | ORAL | Status: DC
  Administered 2023-05-24 – 2023-05-26 (×3): 40 mg via ORAL
  Filled 2023-05-24 (×3): qty 1

## 2023-05-24 MED ORDER — FENTANYL CITRATE (PF) 100 MCG/2ML IJ SOLN
25.0000 ug | INTRAMUSCULAR | Status: DC | PRN
  Administered 2023-05-24: 50 ug via INTRAVENOUS
  Administered 2023-05-24 (×2): 25 ug via INTRAVENOUS

## 2023-05-24 MED ORDER — INSULIN ASPART 100 UNIT/ML IJ SOLN: 0.0000 [IU] | Freq: Three times a day (TID) | INTRAMUSCULAR | Status: DC

## 2023-05-24 MED ORDER — CHLORHEXIDINE GLUCONATE 0.12 % MT SOLN
15.0000 mL | Freq: Once | OROMUCOSAL | Status: AC
  Administered 2023-05-24: 15 mL via OROMUCOSAL

## 2023-05-24 MED ORDER — ACETAMINOPHEN 500 MG PO TABS
ORAL_TABLET | ORAL | Status: AC
  Filled 2023-05-24: qty 2

## 2023-05-24 MED ORDER — SODIUM CHLORIDE 0.9 % IV SOLN: INTRAVENOUS | Status: DC

## 2023-05-24 MED ORDER — PROPOFOL 10 MG/ML IV BOLUS
INTRAVENOUS | Status: DC | PRN
  Administered 2023-05-24: 170 mg via INTRAVENOUS
  Administered 2023-05-24: 30 mg via INTRAVENOUS

## 2023-05-24 MED ORDER — OXYCODONE HCL 5 MG PO TABS
ORAL_TABLET | ORAL | Status: AC
  Filled 2023-05-24: qty 1

## 2023-05-24 MED ORDER — DIPHENHYDRAMINE HCL 50 MG/ML IJ SOLN: 12.5000 mg | Freq: Four times a day (QID) | INTRAMUSCULAR | Status: DC | PRN

## 2023-05-24 MED ORDER — FENTANYL CITRATE (PF) 100 MCG/2ML IJ SOLN
INTRAMUSCULAR | Status: AC
  Filled 2023-05-24: qty 2

## 2023-05-24 MED ORDER — ROCURONIUM BROMIDE 10 MG/ML (PF) SYRINGE
PREFILLED_SYRINGE | INTRAVENOUS | Status: AC
  Filled 2023-05-24: qty 10

## 2023-05-24 MED ORDER — BUPIVACAINE LIPOSOME 1.3 % IJ SUSP
INTRAMUSCULAR | Status: AC
  Filled 2023-05-24: qty 20

## 2023-05-24 MED ORDER — INSULIN ASPART 100 UNIT/ML IJ SOLN: 0.0000 [IU] | Freq: Every day | INTRAMUSCULAR | Status: DC

## 2023-05-24 MED ORDER — CHLORHEXIDINE GLUCONATE 0.12 % MT SOLN
OROMUCOSAL | Status: AC
  Filled 2023-05-24: qty 15

## 2023-05-24 MED ORDER — ROPINIROLE HCL 1 MG PO TABS
1.0000 mg | ORAL_TABLET | Freq: Every day | ORAL | Status: DC
  Administered 2023-05-24 – 2023-05-25 (×2): 1 mg via ORAL
  Filled 2023-05-24 (×2): qty 1

## 2023-05-24 MED ORDER — MONTELUKAST SODIUM 10 MG PO TABS
10.0000 mg | ORAL_TABLET | Freq: Every day | ORAL | Status: DC
Start: 2023-05-24 — End: 2023-05-26
  Administered 2023-05-24 – 2023-05-25 (×2): 10 mg via ORAL
  Filled 2023-05-24 (×2): qty 1

## 2023-05-24 MED ORDER — MORPHINE SULFATE (PF) 2 MG/ML IV SOLN: 2.0000 mg | INTRAVENOUS | Status: DC | PRN

## 2023-05-24 MED ORDER — TIZANIDINE HCL 4 MG PO TABS
4.0000 mg | ORAL_TABLET | Freq: Every evening | ORAL | Status: DC | PRN
  Administered 2023-05-24: 4 mg via ORAL
  Filled 2023-05-24: qty 1

## 2023-05-24 MED ORDER — CEFAZOLIN SODIUM-DEXTROSE 3-4 GM/150ML-% IV SOLN
3.0000 g | INTRAVENOUS | Status: AC
  Administered 2023-05-24: 3 g via INTRAVENOUS
  Filled 2023-05-24: qty 150

## 2023-05-24 MED ORDER — SODIUM CHLORIDE 0.9 % IV SOLN: INTRAVENOUS | Status: AC

## 2023-05-24 MED ORDER — ROCURONIUM BROMIDE 100 MG/10ML IV SOLN
INTRAVENOUS | Status: DC | PRN
  Administered 2023-05-24: 20 mg via INTRAVENOUS
  Administered 2023-05-24: 50 mg via INTRAVENOUS

## 2023-05-24 MED ORDER — DEXAMETHASONE SODIUM PHOSPHATE 10 MG/ML IJ SOLN
INTRAMUSCULAR | Status: DC | PRN
  Administered 2023-05-24: 4 mg via INTRAVENOUS

## 2023-05-24 MED ORDER — OXYCODONE HCL 5 MG PO TABS
5.0000 mg | ORAL_TABLET | ORAL | Status: DC | PRN
  Administered 2023-05-24 – 2023-05-25 (×4): 5 mg via ORAL
  Filled 2023-05-24 (×4): qty 1

## 2023-05-24 MED ORDER — PIPERACILLIN-TAZOBACTAM 3.375 G IVPB
3.3750 g | Freq: Three times a day (TID) | INTRAVENOUS | Status: DC
  Filled 2023-05-24: qty 50

## 2023-05-24 MED ORDER — ENOXAPARIN SODIUM 60 MG/0.6ML IJ SOSY
60.0000 mg | PREFILLED_SYRINGE | INTRAMUSCULAR | Status: DC
  Administered 2023-05-24 – 2023-05-25 (×2): 60 mg via SUBCUTANEOUS
  Filled 2023-05-24 (×2): qty 0.6

## 2023-05-24 MED ORDER — PIPERACILLIN-TAZOBACTAM 3.375 G IVPB
3.3750 g | Freq: Three times a day (TID) | INTRAVENOUS | Status: DC
  Administered 2023-05-24 – 2023-05-26 (×5): 3.375 g via INTRAVENOUS
  Filled 2023-05-24 (×4): qty 50

## 2023-05-24 MED ORDER — PHENYLEPHRINE 80 MCG/ML (10ML) SYRINGE FOR IV PUSH (FOR BLOOD PRESSURE SUPPORT)
PREFILLED_SYRINGE | INTRAVENOUS | Status: DC | PRN
  Administered 2023-05-24: 80 ug via INTRAVENOUS
  Administered 2023-05-24: 160 ug via INTRAVENOUS

## 2023-05-24 MED ORDER — ONDANSETRON HCL 4 MG/2ML IJ SOLN
INTRAMUSCULAR | Status: AC
  Filled 2023-05-24: qty 2

## 2023-05-24 MED ORDER — FENTANYL CITRATE (PF) 100 MCG/2ML IJ SOLN
INTRAMUSCULAR | Status: DC | PRN
  Administered 2023-05-24 (×2): 50 ug via INTRAVENOUS
  Administered 2023-05-24: 25 ug via INTRAVENOUS
  Administered 2023-05-24: 50 ug via INTRAVENOUS
  Administered 2023-05-24: 25 ug via INTRAVENOUS

## 2023-05-24 MED ORDER — SUGAMMADEX SODIUM 200 MG/2ML IV SOLN
INTRAVENOUS | Status: DC | PRN
  Administered 2023-05-24: 200 mg via INTRAVENOUS

## 2023-05-24 MED ORDER — BUPIVACAINE LIPOSOME 1.3 % IJ SUSP
INTRAMUSCULAR | Status: DC | PRN
  Administered 2023-05-24: 20 mL

## 2023-05-24 MED ORDER — INDOCYANINE GREEN 25 MG IV SOLR
INTRAVENOUS | Status: AC
  Filled 2023-05-24: qty 10

## 2023-05-24 MED ORDER — BUPIVACAINE-EPINEPHRINE (PF) 0.25% -1:200000 IJ SOLN
INTRAMUSCULAR | Status: DC | PRN
  Administered 2023-05-24: 30 mL

## 2023-05-24 MED ORDER — OXYCODONE HCL 5 MG PO TABS
5.0000 mg | ORAL_TABLET | Freq: Once | ORAL | Status: AC | PRN
  Administered 2023-05-24: 5 mg via ORAL

## 2023-05-24 SURGICAL SUPPLY — 47 items
CANNULA REDUCER 12-8 DVNC XI (CANNULA) ×1 IMPLANT
CATH REDDICK CHOLANGI 4FR 50CM (CATHETERS) IMPLANT
CAUTERY HOOK MNPLR 1.6 DVNC XI (INSTRUMENTS) ×1 IMPLANT
CLIP LIGATING HEMO O LOK GREEN (MISCELLANEOUS) ×1 IMPLANT
DERMABOND ADVANCED .7 DNX12 (GAUZE/BANDAGES/DRESSINGS) ×1 IMPLANT
DRAIN CHANNEL 19F RND (DRAIN) IMPLANT
DRAPE ARM DVNC X/XI (DISPOSABLE) ×4 IMPLANT
DRAPE COLUMN DVNC XI (DISPOSABLE) ×1 IMPLANT
ELECT REM PT RETURN 9FT ADLT (ELECTROSURGICAL) ×1 IMPLANT
ELECTRODE REM PT RTRN 9FT ADLT (ELECTROSURGICAL) ×1 IMPLANT
EVACUATOR SILICONE 100CC (DRAIN) IMPLANT
FORCEPS BPLR R/ABLATION 8 DVNC (INSTRUMENTS) ×1 IMPLANT
FORCEPS PROGRASP DVNC XI (FORCEP) ×1 IMPLANT
GLOVE BIO SURGEON STRL SZ7 (GLOVE) ×2 IMPLANT
GOWN STRL REUS W/ TWL LRG LVL3 (GOWN DISPOSABLE) ×4 IMPLANT
IRRIGATION STRYKERFLOW (MISCELLANEOUS) IMPLANT
IRRIGATOR STRYKERFLOW (MISCELLANEOUS) ×1 IMPLANT
IV CATH ANGIO 12GX3 LT BLUE (NEEDLE) IMPLANT
KIT PINK PAD W/HEAD ARE REST (MISCELLANEOUS) ×1 IMPLANT
KIT PINK PAD W/HEAD ARM REST (MISCELLANEOUS) ×1 IMPLANT
LABEL OR SOLS (LABEL) ×1 IMPLANT
MANIFOLD NEPTUNE II (INSTRUMENTS) ×1 IMPLANT
NDL HYPO 22X1.5 SAFETY MO (MISCELLANEOUS) ×1 IMPLANT
NEEDLE HYPO 22X1.5 SAFETY MO (MISCELLANEOUS) ×1 IMPLANT
NS IRRIG 500ML POUR BTL (IV SOLUTION) ×1 IMPLANT
OBTURATOR OPTICAL STND 8 DVNC (TROCAR) ×1 IMPLANT
OBTURATOR OPTICALSTD 8 DVNC (TROCAR) ×1 IMPLANT
PACK LAP CHOLECYSTECTOMY (MISCELLANEOUS) ×1 IMPLANT
SEAL UNIV 5-12 XI (MISCELLANEOUS) ×4 IMPLANT
SET TUBE SMOKE EVAC HIGH FLOW (TUBING) ×1 IMPLANT
SOL ELECTROSURG ANTI STICK (MISCELLANEOUS) ×1 IMPLANT
SOLUTION ELECTROSURG ANTI STCK (MISCELLANEOUS) ×1 IMPLANT
SPIKE FLUID TRANSFER (MISCELLANEOUS) ×1 IMPLANT
SPONGE DRAIN TRACH 4X4 STRL 2S (GAUZE/BANDAGES/DRESSINGS) IMPLANT
SPONGE T-LAP 18X18 ~~LOC~~+RFID (SPONGE) ×1 IMPLANT
SPONGE T-LAP 4X18 ~~LOC~~+RFID (SPONGE) IMPLANT
STOPCOCK 3WAY MALE LL (IV SETS) IMPLANT
SUT ETHILON 3-0 FS-10 30 BLK (SUTURE) ×1 IMPLANT
SUT MNCRL AB 4-0 PS2 18 (SUTURE) ×1 IMPLANT
SUT VICRYL 0 UR6 27IN ABS (SUTURE) ×2 IMPLANT
SUTURE EHLN 3-0 FS-10 30 BLK (SUTURE) IMPLANT
SYR 20ML LL LF (SYRINGE) IMPLANT
SYS BAG RETRIEVAL 10MM (BASKET) ×1 IMPLANT
SYSTEM BAG RETRIEVAL 10MM (BASKET) ×1 IMPLANT
TRAP FLUID SMOKE EVACUATOR (MISCELLANEOUS) ×1 IMPLANT
WATER STERILE IRR 3000ML UROMA (IV SOLUTION) IMPLANT
WATER STERILE IRR 500ML POUR (IV SOLUTION) ×1 IMPLANT

## 2023-05-24 NOTE — Anesthesia Preprocedure Evaluation (Signed)
 Anesthesia Evaluation  Patient identified by MRN, date of birth, ID band Patient awake    Reviewed: Allergy & Precautions, NPO status , Patient's Chart, lab work & pertinent test results  History of Anesthesia Complications Negative for: history of anesthetic complications  Airway Mallampati: III  TM Distance: <3 FB Neck ROM: full    Dental  (+) Chipped, Poor Dentition, Missing   Pulmonary neg shortness of breath, asthma , sleep apnea    Pulmonary exam normal        Cardiovascular Exercise Tolerance: Good hypertension, (-) angina Normal cardiovascular exam     Neuro/Psych  Headaches  Neuromuscular disease    GI/Hepatic Neg liver ROS,GERD  Controlled,,  Endo/Other  diabetes, Type 2Hypothyroidism  Class 3 obesity  Renal/GU      Musculoskeletal   Abdominal   Peds  Hematology negative hematology ROS (+)   Anesthesia Other Findings Past Medical History: No date: Allergy '95: Anxiety No date: Asthma No date: Cataract '95: Depression No date: Esophageal reflux No date: Gouty arthropathy, unspecified No date: Herpes simplex without mention of complication No date: Irritable bowel syndrome No date: Localized osteoarthrosis not specified whether primary or  secondary, unspecified site No date: Migraine, unspecified, without mention of intractable  migraine without mention of status migrainosus No date: Mild cognitive impairment No date: Mixed hyperlipidemia No date: Myalgia and myositis, unspecified No date: Obstructive sleep apnea (adult) (pediatric) No date: Palpitations No date: Sleep apnea     Comment:  No CPAP No date: Type II or unspecified type diabetes mellitus without  mention of complication, not stated as uncontrolled     Comment:  Diet controlled No date: Umbilical hernia without mention of obstruction or gangrene No date: Unspecified essential hypertension No date: Unspecified  hypothyroidism  Past Surgical History: No date: BREAST BIOPSY; Left     Comment:  benign 01/18/2023: CATARACT EXTRACTION W/PHACO; Left     Comment:  Procedure: CATARACT EXTRACTION PHACO AND INTRAOCULAR               LENS PLACEMENT (IOC) LEFT DIABETIC  CLAREON VIVITY TORIC               LENS;  Surgeon: Galen Manila, MD;  Location: [facility];  Service: Ophthalmology;  Laterality: Left;              4.47 0:31.9 02/01/2023: CATARACT EXTRACTION W/PHACO; Right     Comment:  Procedure: CATARACT EXTRACTION PHACO AND INTRAOCULAR               LENS PLACEMENT (IOC) RIGHT DIABETIC  CLAREON VIVITY TORIC              LENS 3.68 00:28.9;  Surgeon: Galen Manila, MD;                Location: [facility];  Service: Ophthalmology;                Laterality: Right; No date: COLONOSCOPY     Comment:  Dr Thurmond Butts 11/19/2015: COLONOSCOPY WITH PROPOFOL; N/A     Comment:  Procedure: COLONOSCOPY WITH PROPOFOL;  Surgeon:               Kieth Brightly, MD;  Location: [facility] ENDOSCOPY;                Service: Endoscopy;  Laterality: N/A; 06/18/2021: COLONOSCOPY WITH PROPOFOL; N/A     Comment:  Procedure:  COLONOSCOPY WITH PROPOFOL;  Surgeon: Toney Reil, MD;  Location: [facility] ENDOSCOPY;  Service:               Gastroenterology;  Laterality: N/A; 08/20/2014: ESOPHAGOGASTRODUODENOSCOPY; N/A     Comment:  Procedure: ESOPHAGOGASTRODUODENOSCOPY (EGD);  Surgeon:               Kieth Brightly, MD;  Location: [facility] ENDOSCOPY;                Service: Endoscopy;  Laterality: N/A; 06/18/2021: ESOPHAGOGASTRODUODENOSCOPY (EGD) WITH PROPOFOL; N/A     Comment:  Procedure: ESOPHAGOGASTRODUODENOSCOPY (EGD) WITH               PROPOFOL;  Surgeon: Toney Reil, MD;  Location:               [facility] ENDOSCOPY;  Service: Gastroenterology;  Laterality:               N/A; No date: FOOT SURGERY; Left     Comment:  heel spur No date: HERNIA REPAIR     Comment:   umbilical 1980s: NASAL SINUS SURGERY No date: UMBILICAL HERNIA REPAIR  BMI    Body Mass Index: 44.39 kg/m      Reproductive/Obstetrics negative OB ROS                             Anesthesia Physical Anesthesia Plan  ASA: 3  Anesthesia Plan: General ETT   Post-op Pain Management:    Induction: Intravenous  PONV Risk Score and Plan: Ondansetron, Dexamethasone, Midazolam and Treatment may vary due to age or medical condition  Airway Management Planned: Oral ETT and Video Laryngoscope Planned  Additional Equipment:   Intra-op Plan:   Post-operative Plan: Extubation in OR  Informed Consent: I have reviewed the patients History and Physical, chart, labs and discussed the procedure including the risks, benefits and alternatives for the proposed anesthesia with the patient or authorized representative who has indicated his/her understanding and acceptance.     Dental Advisory Given  Plan Discussed with: Anesthesiologist, CRNA and Surgeon  Anesthesia Plan Comments: (Patient consented for risks of anesthesia including but not limited to:  - adverse reactions to medications - damage to eyes, teeth, lips or other oral mucosa - nerve damage due to positioning  - sore throat or hoarseness - Damage to heart, brain, nerves, lungs, other parts of body or loss of life  Patient voiced understanding and assent.)       Anesthesia Quick Evaluation

## 2023-05-24 NOTE — Op Note (Addendum)
 Robotic assisted laparoscopic Cholecystectomy placement of 19 blake drain  Pre-operative Diagnosis: Acute cholecystitis  Post-operative Diagnosis: Severe cholecystitis with gangrenous changes  Procedure:  Robotic assisted laparoscopic Cholecystectomy  Surgeon: Sterling Big, MD FACS  Anesthesia: Gen. with endotracheal tube  Findings: Severe acute Gangrenous Cholecystitis  Serosa of the GB necrotic and perforated by manipulating  Very difficult case due to severe inflammatory response around the GB, BMI 44, prior hernia repair w mesh. All these factors added significant additional time to the operation for the safe completion of the procedure  Estimated Blood Loss:100 cc       Specimens: Gallbladder           Complications: none   Procedure Details  The patient was seen again in the Holding Room. The benefits, complications, treatment options, and expected outcomes were discussed with the patient. The risks of bleeding, infection, recurrence of symptoms, failure to resolve symptoms, bile duct damage, bile duct leak, retained common bile duct stone, bowel injury, any of which could require further surgery and/or ERCP, stent, or papillotomy were reviewed with the patient. The likelihood of improving the patient's symptoms with return to their baseline status is good.  The patient and/or family concurred with the proposed plan, giving informed consent.  The patient was taken to Operating Room, identified  and the procedure verified as Laparoscopic Cholecystectomy.  A Time Out was held and the above information confirmed.  Prior to the induction of general anesthesia, antibiotic prophylaxis was administered. VTE prophylaxis was in place. General endotracheal anesthesia was then administered and tolerated well. After the induction, the abdomen was prepped with Chloraprep and draped in the sterile fashion. The patient was positioned in the supine position.  Cut down technique was used to enter  the abdominal cavity and a Hasson trochar was placed after two vicryl stitches were anchored to the fascia. Pneumoperitoneum was then created with CO2 and tolerated well without any adverse changes in the patient's vital signs.   Periumbilical mesh encounter with adhesions from the omentum to the abd wall. Under direct visualization adhesions were lyses with cautery and finger dissection.    Three 8-mm ports were placed under direct vision. All skin incisions  were infiltrated with a local anesthetic agent before making the incision and placing the trocars.  WE inspected the bowel and there was no evidence of injuries.  The patient was positioned  in reverse Trendelenburg, robot was brought to the surgical field and docked in the standard fashion.  We made sure all the instrumentation was kept indirect view at all times and that there were no collision between the arms. I scrubbed out and went to the console. The Gb was covered by the omentum, adhesions lyses with hook cautery. The GB was identified with severe inflammation and necrosis of the wall. Upon gentle manipulation there was perforation due to the disease process. There was also seropurulent fluid around the gb that was aspirated The the fundus grasped and retracted cephalad. Adhesions were lysed bluntly. The infundibulum was grasped and retracted laterally, exposing the peritoneum overlying the triangle of Calot.  Please note that the inflammation was severe I was able to dissect the fundus of the GB, given inflammation I opened the anterior wall and followed the infundibulum until I reached for the Cystic duct junction. The cystic duct was  identified and bluntly dissected.   Artery and duct were double clipped and divided.  Using ICG cholangiography we visualize the cystic duct and CBD evidence of bile injuries.  The gallbladder was taken from the gallbladder fossa in a retrograde fashion with the electrocautery.  Hemostasis was achieved  with the electrocautery. nspection of the right upper quadrant was performed. No bleeding, bile duct injury or leak, or bowel injury was noted. The wound was irrigated profusely with saline and blake drain placed in the GB bed.  Robotic instruments and robotic arms were undocked in the standard fashion.  I scrubbed back in.  The gallbladder was removed and placed in an Endocatch bag.   Pneumoperitoneum was released.  The periumbilical port site was closed with interrumpted 0 Vicryl sutures. 4-0 subcuticular Monocryl was used to close the skin. Dermabond was  applied.  The patient was then extubated and brought to the recovery room in stable condition. Sponge, lap, and needle counts were correct at closure and at the conclusion of the case.  Given presence of active severe infection I added zosyn and plan to admit the pt for enteral antibiotics resuscitation and clinical monitoring.      Sterling Big, MD, FACS

## 2023-05-24 NOTE — Interval H&P Note (Signed)
 History and Physical Interval Note:  05/24/2023 1:16 PM  Sarah Gordon  has presented today for surgery, with the diagnosis of acute cholecystitis.  The various methods of treatment have been discussed with the patient and family. After consideration of risks, benefits and other options for treatment, the patient has consented to  Procedure(s): XI ROBOTIC ASSISTED LAPAROSCOPIC CHOLECYSTECTOMY (N/A) INDOCYANINE GREEN FLUORESCENCE IMAGING (ICG) (N/A) as a surgical intervention.  The patient's history has been reviewed, patient examined, no change in status, stable for surgery.  I have reviewed the patient's chart and labs.  Questions were answered to the patient's satisfaction.     Sarah Gordon

## 2023-05-24 NOTE — Anesthesia Procedure Notes (Addendum)
 Procedure Name: Intubation Date/Time: 05/24/2023 2:06 PM  Performed by: Otho Perl, CRNAPre-anesthesia Checklist: Patient identified, Patient being monitored, Timeout performed, Emergency Drugs available and Suction available Patient Re-evaluated:Patient Re-evaluated prior to induction Oxygen Delivery Method: Circle system utilized Preoxygenation: Pre-oxygenation with 100% oxygen Induction Type: IV induction Ventilation: Mask ventilation without difficulty Laryngoscope Size: 3 and McGrath Grade View: Grade I Tube type: Oral Tube size: 6.5 mm Number of attempts: 1 Airway Equipment and Method: Stylet Placement Confirmation: ETT inserted through vocal cords under direct vision, positive ETCO2 and breath sounds checked- equal and bilateral Secured at: 20 cm Tube secured with: Tape Dental Injury: Teeth and Oropharynx as per pre-operative assessment

## 2023-05-24 NOTE — Transfer of Care (Signed)
 Immediate Anesthesia Transfer of Care Note  Patient: Sarah Gordon  Procedure(s) Performed: XI ROBOTIC ASSISTED LAPAROSCOPIC CHOLECYSTECTOMY INDOCYANINE GREEN FLUORESCENCE IMAGING (ICG)  Patient Location: PACU  Anesthesia Type:General  Level of Consciousness: awake  Airway & Oxygen Therapy: Patient Spontanous Breathing and Patient connected to face mask oxygen  Post-op Assessment: Report given to RN and Post -op Vital signs reviewed and stable  Post vital signs: Reviewed and stable  Last Vitals:  Vitals Value Taken Time  BP 124/74 05/24/23 1556  Temp    Pulse 85 05/24/23 1601  Resp 15 05/24/23 1601  SpO2 96 % 05/24/23 1601  Vitals shown include unfiled device data.  Last Pain:  Vitals:   05/24/23 1315  TempSrc: Oral  PainSc: 0-No pain         Complications: No notable events documented.

## 2023-05-25 LAB — COMPREHENSIVE METABOLIC PANEL
ALT: 55 U/L — ABNORMAL HIGH (ref 0–44)
AST: 100 U/L — ABNORMAL HIGH (ref 15–41)
Albumin: 2.7 g/dL — ABNORMAL LOW (ref 3.5–5.0)
Alkaline Phosphatase: 71 U/L (ref 38–126)
Anion gap: 9 (ref 5–15)
BUN: 29 mg/dL — ABNORMAL HIGH (ref 8–23)
CO2: 20 mmol/L — ABNORMAL LOW (ref 22–32)
Calcium: 8.1 mg/dL — ABNORMAL LOW (ref 8.9–10.3)
Chloride: 102 mmol/L (ref 98–111)
Creatinine, Ser: 1.49 mg/dL — ABNORMAL HIGH (ref 0.44–1.00)
GFR, Estimated: 38 mL/min — ABNORMAL LOW (ref >60)
Glucose, Bld: 148 mg/dL — ABNORMAL HIGH (ref 70–99)
Potassium: 4.2 mmol/L (ref 3.5–5.1)
Sodium: 131 mmol/L — ABNORMAL LOW (ref 135–145)
Total Bilirubin: 0.4 mg/dL (ref 0.0–1.2)
Total Protein: 6.3 g/dL — ABNORMAL LOW (ref 6.5–8.1)

## 2023-05-25 LAB — CBC
HCT: 28.7 % — ABNORMAL LOW (ref 36.0–46.0)
Hemoglobin: 9.7 g/dL — ABNORMAL LOW (ref 12.0–15.0)
MCH: 31.7 pg (ref 26.0–34.0)
MCHC: 33.8 g/dL (ref 30.0–36.0)
MCV: 93.8 fL (ref 80.0–100.0)
Platelets: 336 10*3/uL (ref 150–400)
RBC: 3.06 MIL/uL — ABNORMAL LOW (ref 3.87–5.11)
RDW: 12.8 % (ref 11.5–15.5)
WBC: 10 10*3/uL (ref 4.0–10.5)
nRBC: 0 % (ref 0.0–0.2)

## 2023-05-25 LAB — GLUCOSE, CAPILLARY
Glucose-Capillary: 119 mg/dL — ABNORMAL HIGH (ref 70–99)
Glucose-Capillary: 94 mg/dL (ref 70–99)
Glucose-Capillary: 84 mg/dL (ref 70–99)
Glucose-Capillary: 94 mg/dL (ref 70–99)
Glucose-Capillary: 100 mg/dL — ABNORMAL HIGH (ref 70–99)

## 2023-05-25 LAB — HEMOGLOBIN A1C
Hgb A1c MFr Bld: 6.5 % — ABNORMAL HIGH (ref 4.8–5.6)
Mean Plasma Glucose: 139.85 mg/dL

## 2023-05-25 LAB — MAGNESIUM: Magnesium: 1.9 mg/dL (ref 1.7–2.4)

## 2023-05-25 LAB — HIV ANTIBODY (ROUTINE TESTING W REFLEX): HIV Screen 4th Generation wRfx: NONREACTIVE

## 2023-05-25 MED ORDER — ALBUMIN HUMAN 25 % IV SOLN
INTRAVENOUS | Status: AC
  Filled 2023-05-25: qty 50

## 2023-05-25 MED ORDER — ALBUMIN NICU 25% IV SOLUTION
INTRAVENOUS | Status: AC
  Filled 2023-05-25: qty 100

## 2023-05-25 MED ORDER — ACETAMINOPHEN 500 MG PO TABS
ORAL_TABLET | ORAL | Status: AC
  Filled 2023-05-25: qty 2

## 2023-05-25 MED ORDER — ALBUMIN HUMAN 25 % IV SOLN
25.0000 g | Freq: Once | INTRAVENOUS | Status: AC
Start: 2023-05-25 — End: 2023-05-25
  Administered 2023-05-25: 25 g via INTRAVENOUS

## 2023-05-25 MED ORDER — PIPERACILLIN-TAZOBACTAM 3.375 G IVPB
INTRAVENOUS | Status: AC
  Filled 2023-05-25: qty 50

## 2023-05-25 NOTE — Anesthesia Postprocedure Evaluation (Signed)
 Anesthesia Post Note  Patient: Sarah Gordon  Procedure(s) Performed: XI ROBOTIC ASSISTED LAPAROSCOPIC CHOLECYSTECTOMY INDOCYANINE GREEN FLUORESCENCE IMAGING (ICG)  Patient location during evaluation: PACU Anesthesia Type: General Level of consciousness: awake and alert Pain management: pain level controlled Vital Signs Assessment: post-procedure vital signs reviewed and stable Respiratory status: spontaneous breathing, nonlabored ventilation, respiratory function stable and patient connected to nasal cannula oxygen Cardiovascular status: blood pressure returned to baseline and stable Postop Assessment: no apparent nausea or vomiting Anesthetic complications: no   No notable events documented.   Last Vitals:  Vitals:   05/24/23 1916 05/25/23 0054  BP: 128/73 98/64  Pulse: 84 62  Resp: 18 15  Temp: 36.7 C 36.6 C  SpO2: 96% 95%    Last Pain:  Vitals:   05/25/23 0054  TempSrc: Oral  PainSc: 0-No pain                 Lenard Simmer

## 2023-05-25 NOTE — Plan of Care (Signed)
  Problem: Metabolic: Goal: Ability to maintain appropriate glucose levels will improve Outcome: Progressing   Problem: Activity: Goal: Risk for activity intolerance will decrease Outcome: Progressing   Problem: Coping: Goal: Level of anxiety will decrease Outcome: Progressing   Problem: Elimination: Goal: Will not experience complications related to urinary retention Outcome: Progressing

## 2023-05-25 NOTE — Progress Notes (Signed)
 [facility] SURGICAL PROGRESS NOTE  Hospital Day(s): 1.   Post op day(s): 1 Day Post-Op.   Interval History:  Patient seen and examined No acute events or new complaints overnight.  Patient reports abdominal soreness expectedly; otherwise doing well  No fever, chills, nausea, emesis WBC this AM normal at 10.0K Hgb to 9.7 Slight AKI with sCr - 1.49; UO - unmeasured Hyponatremia to 131 Mild LFT changes secondary to cholecystectomy Bilirubin normal at 0.4 Drain 70 ccs; serosanguinous - leaking around this  She is on Zosyn Carb modified diet - tolerating   Vital signs in last 24 hours: [min-max] current  Temp:  [97.6 F (36.4 C)-98.6 F (37 C)] 97.7 F (36.5 C) (03/05 0706) Pulse Rate:  [54-122] 54 (03/05 0706) Resp:  [9-25] 17 (03/05 0706) BP: (98-128)/(58-101) 121/65 (03/05 0706) SpO2:  [88 %-99 %] 99 % (03/05 0706) Weight:  [124.7 kg] 124.7 kg (03/04 1315)     Height: 5\' 6"  (167.6 cm) Weight: 124.7 kg BMI (Calculated): 44.41   Intake/Output last 2 shifts:  03/04 0701 - 03/05 0700 In: 2566.7 [P.O.:180; I.V.:1636.7; IV Piggyback:750] Out: 95 [Drains:70; Blood:25]   Physical Exam:  Constitutional: alert, cooperative and no distress  Respiratory: breathing non-labored at rest  Cardiovascular: regular rate and sinus rhythm  Gastrointestinal: Soft, incisional soreness, non-distended, no rebound/guarding. Surgical drain in right lateral abdomen; there is drainage on previous dress which I changed; output serosanguinous  Integumentary: Laparoscopic incisions are CDI with dermabond, no erythema or drainage   Labs:     Latest Ref Rng & Units 05/25/2023    5:31 AM 05/21/2023   10:33 AM 05/19/2023    8:47 AM  CBC  WBC 4.0 - 10.5 K/uL 10.0  8.5  11.5   Hemoglobin 12.0 - 15.0 g/dL 9.7  16.1  09.6   Hematocrit 36.0 - 46.0 % 28.7  33.0  37.2   Platelets 150 - 400 K/uL 336  309  326       Latest Ref Rng & Units 05/25/2023    5:31 AM 05/19/2023    8:47 AM 05/17/2023     7:38 AM  CMP  Glucose 70 - 99 mg/dL 045  409  811   BUN 8 - 23 mg/dL 29  20  31    Creatinine 0.44 - 1.00 mg/dL 9.14  7.82  9.56   Sodium 135 - 145 mmol/L 131  134  134   Potassium 3.5 - 5.1 mmol/L 4.2  4.6  4.3   Chloride 98 - 111 mmol/L 102  99  102   CO2 22 - 32 mmol/L 20  27  25    Calcium 8.9 - 10.3 mg/dL 8.1  8.7  8.9   Total Protein 6.5 - 8.1 g/dL 6.3  6.3  7.4   Total Bilirubin 0.0 - 1.2 mg/dL 0.4  0.8  0.3   Alkaline Phos 38 - 126 U/L 71   77   AST 15 - 41 U/L 100  19  23   ALT 0 - 44 U/L 55  22  21      Imaging studies: No new pertinent imaging studies   Assessment/Plan:  73 y.o. female with AKI otherwise doing well 1 Day Post-Op s/p robotic assisted laparoscopic cholecystectomy for acute gangrenous cholecystitis.   - Okay to continue diet as tolerated - Albumin ordered - Continue IV Abx (Zosyn); will need PO for home - Continue surgical drain; monitor and record output as needed - change dressing as needed   -  Monitor abdominal examination; on-going bowel function   - Pain control prn; antiemetics prn   - Monitor renal function; morning labs   - SSI - Mobilize as tolerated    - Discharge Planning: Overall doing well, slight AKI. I do think it is reasonable to observe another 24 hours given degree of cholecystitis and comorbid conditions, which she is agreeable with. Will plan for DC tomorrow (03/06).   All of the above findings and recommendations were discussed with the patient, patient's family (husband at bedside), and the medical team, and all of patient's and family's questions were answered to their expressed satisfaction.  -- Lynden Oxford, PA-C [facility] 05/25/2023, 7:42 AM M-F: 7am - 4pm

## 2023-05-25 NOTE — Plan of Care (Signed)
  Problem: Education: Goal: Ability to describe self-care measures that may prevent or decrease complications (Diabetes Survival Skills Education) will improve Outcome: Progressing   Problem: Coping: Goal: Ability to adjust to condition or change in health will improve Outcome: Progressing   Problem: Nutritional: Goal: Maintenance of adequate nutrition will improve Outcome: Progressing   Problem: Skin Integrity: Goal: Risk for impaired skin integrity will decrease Outcome: Progressing   Problem: Tissue Perfusion: Goal: Adequacy of tissue perfusion will improve Outcome: Progressing   Problem: Clinical Measurements: Goal: Ability to maintain clinical measurements within normal limits will improve Outcome: Progressing

## 2023-05-26 LAB — CBC
HCT: 28.2 % — ABNORMAL LOW (ref 36.0–46.0)
Hemoglobin: 9.7 g/dL — ABNORMAL LOW (ref 12.0–15.0)
MCH: 31.8 pg (ref 26.0–34.0)
MCHC: 34.4 g/dL (ref 30.0–36.0)
MCV: 92.5 fL (ref 80.0–100.0)
Platelets: 338 10*3/uL (ref 150–400)
RBC: 3.05 MIL/uL — ABNORMAL LOW (ref 3.87–5.11)
RDW: 12.9 % (ref 11.5–15.5)
WBC: 7 10*3/uL (ref 4.0–10.5)
nRBC: 0 % (ref 0.0–0.2)

## 2023-05-26 LAB — BASIC METABOLIC PANEL
Anion gap: 9 (ref 5–15)
BUN: 24 mg/dL — ABNORMAL HIGH (ref 8–23)
CO2: 20 mmol/L — ABNORMAL LOW (ref 22–32)
Calcium: 7.9 mg/dL — ABNORMAL LOW (ref 8.9–10.3)
Chloride: 101 mmol/L (ref 98–111)
Creatinine, Ser: 1.37 mg/dL — ABNORMAL HIGH (ref 0.44–1.00)
GFR, Estimated: 42 mL/min — ABNORMAL LOW (ref >60)
Glucose, Bld: 130 mg/dL — ABNORMAL HIGH (ref 70–99)
Potassium: 3.4 mmol/L — ABNORMAL LOW (ref 3.5–5.1)
Sodium: 130 mmol/L — ABNORMAL LOW (ref 135–145)

## 2023-05-26 LAB — GLUCOSE, CAPILLARY
Glucose-Capillary: 110 mg/dL — ABNORMAL HIGH (ref 70–99)
Glucose-Capillary: 88 mg/dL (ref 70–99)

## 2023-05-26 LAB — SURGICAL PATHOLOGY

## 2023-05-26 MED ORDER — POTASSIUM CHLORIDE CRYS ER 20 MEQ PO TBCR
20.0000 meq | EXTENDED_RELEASE_TABLET | Freq: Two times a day (BID) | ORAL | Status: DC
  Administered 2023-05-26: 20 meq via ORAL
  Filled 2023-05-26: qty 1

## 2023-05-26 MED ORDER — OXYCODONE HCL 5 MG PO TABS: 5.0000 mg | ORAL_TABLET | Freq: Four times a day (QID) | ORAL | 0 refills | Status: DC | PRN

## 2023-05-26 MED ORDER — AMOXICILLIN-POT CLAVULANATE 875-125 MG PO TABS: 1.0000 | ORAL_TABLET | Freq: Two times a day (BID) | ORAL | 0 refills | Status: AC

## 2023-05-26 NOTE — Discharge Summary (Signed)
 [facility] SUR[facility]ICAL DISCHARGE SUMMARY  Patient ID: Sarah Gordon MRN: [id number] DOB/AGE: 1951-01-22 73 y.o.  Admit date: 05/24/2023 Discharge date: 05/26/2023  Discharge Diagnoses Patient Active Problem List   Diagnosis Date Noted   Calculus of gallbladder with acute cholecystitis without obstruction 05/24/2023   Acute cholecystitis 05/24/2023    Consultants None  Procedures 05/24/2023: Robotic Assisted Laparoscopic Cholecystectomy   HPI: Sarah Gordon is a 73 y.o. female with acute cholecystitis who presents to [facility] on 03/04 for robotic assisted laparoscopic cholecystectomy with [facility] Course: Informed consent was obtained and documented, and patient underwent robotic assisted laparoscopic cholecystectomy (Dr Everlene Farrier, 05/24/2023).  Post-operatively, patient did well. She had slight AKI which improved with resuscitation.She was kept ~36 hours post-operatively given gangrenous nature of her gallbladder and comorbid conditions. Advancement of patient's diet and ambulation were well-tolerated. The remainder of patient's hospital course was essentially unremarkable, and discharge planning was initiated accordingly with patient safely able to be discharged home with appropriate discharge instructions, antibiotics (Augmentin x5 days to complete 7 total), pain control, and outpatient follow-up after all of her and her family's questions were answered to their expressed satisfaction.   Discharge Condition: Good   Physical Examination:  Constitutional: alert, cooperative and no distress  Respiratory: breathing non-labored at rest  Cardiovascular: regular rate and sinus rhythm  Gastrointestinal: Soft, incisional soreness, non-distended, no rebound/guarding. Surgical drain in right lateral abdomen; output serosanguinous  Integumentary: Laparoscopic incisions are CDI with dermabond, no erythema or drainage    Allergies as of 05/26/2023       Reactions   Vascepa  [icosapent Ethyl (epa Ethyl Ester) (fish)] Hives, Swelling   Aspir-81 [aspirin] Other (See Comments)   Coagulation disorder, causes bruising    Gabapentin Other (See Comments)   Cognitive/memory issues   Metformin And Related    indigestion   Mounjaro [tirzepatide] Diarrhea   Epigastric pain    Peanut-containing Drug Products    Tongue goes numb from peanuts   Statins Other (See Comments)   Joint pain   Topamax [topiramate] Other (See Comments)   Cognitive issues    Voltaren [diclofenac Sodium] Hives, Other (See Comments)   Lipitor [atorvastatin]    Joint pain        Medication List     TAKE these medications    acetaminophen 500 MG tablet Commonly known as: TYLENOL Take 1,000 mg by mouth every 6 (six) hours as needed for moderate pain (pain score 4-6).   albuterol 108 (90 Base) MCG/ACT inhaler Commonly known as: VENTOLIN HFA Inhale 2 puffs into the lungs every 6 (six) hours as needed for wheezing or shortness of breath.   amoxicillin-clavulanate 875-125 MG tablet Commonly known as: AUGMENTIN Take 1 tablet by mouth 2 (two) times daily for 5 days.   buPROPion 150 MG 24 hr tablet Commonly known as: WELLBUTRIN XL Take 1 tablet (150 mg total) by mouth every morning.   DULoxetine 60 MG capsule Commonly known as: CYMBALTA Take 1 capsule (60 mg total) by mouth daily.   ezetimibe 10 MG tablet Commonly known as: ZETIA Take 1 tablet (10 mg total) by mouth daily.   folic acid 800 MCG tablet Commonly known as: FOLVITE Take 800 mcg by mouth daily.   hydrOXYzine 10 MG tablet Commonly known as: ATARAX Take 10 mg by mouth 3 (three) times daily as needed for anxiety.   hyoscyamine 0.125 MG SL tablet Commonly known as: Levsin/SL Place 1 tablet (0.125 mg total) under the tongue every  4 (four) hours as needed.   levothyroxine 100 MCG tablet Commonly known as: SYNTHROID TAKE 1 TABLET BY MOUTH DAILY   metoprolol succinate 25 MG 24 hr tablet Commonly known as:  TOPROL-XL Take 1 tablet (25 mg total) by mouth daily.   montelukast 10 MG tablet Commonly known as: SINGULAIR Take 1 tablet (10 mg total) by mouth at bedtime.   multivitamin tablet Take 1 tablet by mouth daily.   olmesartan-hydrochlorothiazide 40-12.5 MG tablet Commonly known as: BENICAR HCT Take 1 tablet by mouth daily.   omeprazole 20 MG capsule Commonly known as: PRILOSEC Take 1 capsule (20 mg total) by mouth 2 (two) times daily before a meal.   ondansetron 4 MG tablet Commonly known as: Zofran Take 1 tablet (4 mg total) by mouth every 8 (eight) hours as needed for nausea or vomiting.   oxyCODONE 5 MG immediate release tablet Commonly known as: Oxy IR/ROXICODONE Take 1 tablet (5 mg total) by mouth every 6 (six) hours as needed for breakthrough pain.   QUEtiapine 25 MG tablet Commonly known as: SEROQUEL Take 1 tablet (25 mg total) by mouth at bedtime.   Repatha 140 MG/ML Sosy Generic drug: Evolocumab Inject 140 mg into the skin every 14 (fourteen) days.   rOPINIRole 1 MG tablet Commonly known as: REQUIP Take 1 tablet (1 mg total) by mouth at bedtime.   SYSTANE OP Place 1 drop into both eyes daily as needed (dry eyes).   tiZANidine 4 MG tablet Commonly known as: Zanaflex Take 1 tablet (4 mg total) by mouth at bedtime as needed for muscle spasms.          Follow-up Information     Donovan Kail, PA-C. Go on 06/01/2023.   Specialty: Physician Assistant Why: Go to appointment on 03/12 at 215 PM Contact information: [address] [place] [address] [phone]                  Time spent on discharge management including discussion of hospital course, clinical condition, outpatient instructions, prescriptions, and follow up with the patient and members of the medical team: >30 minutes  -- Lynden Oxford , PA-C [facility]  05/26/2023, 8:39 AM [phone] M-F: 7am - 4pm

## 2023-05-26 NOTE — Progress Notes (Signed)
 Per PA Zach, hold pt's metoprolol. Pt's BP is 106/70 and HR is 77.

## 2023-05-26 NOTE — Discharge Instructions (Signed)
 In addition to included general post-operative instructions,  Diet: Resume home diet. Recommend avoiding or limiting fatty/greasy foods over the next few days/week. If you do eat these, you may (or may not) notice diarrhea. This is expected while your body adjusts to not having a gallbladder, and it typically resolves with time.    Activity: No heavy lifting >20 pounds (children, pets, laundry, garbage) or strenuous activity for 4 weeks, but light activity and walking are encouraged. Do not drive or drink alcohol if taking narcotic pain medications or having pain that might distract from driving.  Drain: Monitor and record drain output daily. Hand out given for this. Please bring this with you to your follow up appointment.   Wound care: If you can keep drain site covered and water proofed, you may shower/get incision wet with soapy water and pat dry (do not rub incisions), but no baths or submerging incision underwater until follow-up.   Medications: Resume all home medications. For mild to moderate pain: acetaminophen (Tylenol) or ibuprofen/naproxen (if no kidney disease). Combining Tylenol with alcohol can substantially increase your risk of causing liver disease. Narcotic pain medications, if prescribed, can be used for severe pain, though may cause nausea, constipation, and drowsiness. Do not combine Tylenol and Percocet (or similar) within a 6 hour period as Percocet (and similar) contain(s) Tylenol. If you do not need the narcotic pain medication, you do not need to fill the prescription.  Call office [phone] / [phone]) at any time if any questions, worsening pain, fevers/chills, bleeding, drainage from incision site, or other concerns.

## 2023-05-26 NOTE — Progress Notes (Signed)
 Pt dc with IV removed and instructions given. Pt educated on JP drain, how to empty, secure, and close it. Pt educated on how to chart drainage amount. Pt wheeled down to medical mall entrance by staff and pt's husband provided transportation.

## 2023-05-26 NOTE — Plan of Care (Signed)
  Problem: Education: Goal: Ability to describe self-care measures that may prevent or decrease complications (Diabetes Survival Skills Education) will improve Outcome: Progressing   Problem: Coping: Goal: Ability to adjust to condition or change in health will improve Outcome: Progressing   Problem: Metabolic: Goal: Ability to maintain appropriate glucose levels will improve Outcome: Progressing   Problem: Nutritional: Goal: Maintenance of adequate nutrition will improve Outcome: Progressing

## 2023-05-28 ENCOUNTER — Encounter: Payer: Self-pay | Admitting: Family Medicine

## 2023-06-01 ENCOUNTER — Ambulatory Visit (INDEPENDENT_AMBULATORY_CARE_PROVIDER_SITE_OTHER): Admitting: Physician Assistant

## 2023-06-01 ENCOUNTER — Encounter: Payer: Self-pay | Admitting: Physician Assistant

## 2023-06-01 VITALS — BP 109/76 | HR 91 | Temp 98.2°F | Ht 66.0 in | Wt 267.0 lb

## 2023-06-01 DIAGNOSIS — K810 Acute cholecystitis: Secondary | ICD-10-CM

## 2023-06-01 DIAGNOSIS — Z09 Encounter for follow-up examination after completed treatment for conditions other than malignant neoplasm: Secondary | ICD-10-CM

## 2023-06-01 DIAGNOSIS — K8000 Calculus of gallbladder with acute cholecystitis without obstruction: Secondary | ICD-10-CM

## 2023-06-01 NOTE — Progress Notes (Signed)
 [facility] POST-OP OFFICE VISIT  06/01/2023  HPI: Sarah Gordon is a 73 y.o. female 8 days s/p robotic assisted laparoscopic cholecystectomy for gangrenous cholecystitis with Dr Everlene Farrier   She is doing well considering Minimal soreness; mainly at drain site - has not needed narcotic pain medications in >3 days No fever, chills, nausea, emesis, bowel changes Drain with <10 ccs; serous Completed Abx Ambulating No other complaints   Vital signs: BP 109/76   Pulse 91   Temp 98.2 F (36.8 C)   Ht 5\' 6"  (1.676 m)   Wt 267 lb (121.1 kg)   SpO2 97%   BMI 43.09 kg/m    Physical Exam: Constitutional: Well appearing female, NAD Abdomen: Soft, non-tender, non-distended, no rebound/guarding. Surgical drain in right lateral abdomen; output serous (removed) Skin: Laparoscopic incisions are healing well, no erythema or drainage   Assessment/Plan: This is a 73 y.o. female 8 days s/p robotic assisted laparoscopic cholecystectomy for gangrenous cholecystitis with Dr Everlene Farrier    - Drain removed without issue; dressing placed  - Pain control prn  - Reviewed wound care recommendation  - Reviewed lifting restrictions; 4 weeks total  - Reviewed surgical pathology; Acute and necrotizing cholecystitis   - She can follow up on as needed basis; She understands to call with questions/concerns  -- Lynden Oxford, PA-C [facility] 06/01/2023, 2:42 PM M-F: 7am - 4pm

## 2023-06-01 NOTE — Patient Instructions (Addendum)
 We have removed your drain today. Keep the dressing in place until tomorrow evening. Change daily until it stops draining. This should take 3-4 days.   GENERAL POST-OPERATIVE PATIENT INSTRUCTIONS   WOUND CARE INSTRUCTIONS:  Keep a dry clean dressing on the wound if there is drainage. The initial bandage may be removed after 24 hours.  Once the wound has quit draining you may leave it open to air.  If clothing rubs against the wound or causes irritation and the wound is not draining you may cover it with a dry dressing during the daytime.  Try to keep the wound dry and avoid ointments on the wound unless directed to do so.  If the wound becomes bright red and painful or starts to drain infected material that is not clear, please contact your physician immediately.  If the wound is mildly pink and has a thick firm ridge underneath it, this is normal, and is referred to as a healing ridge.  This will resolve over the next 4-6 weeks.  BATHING: You may shower if you have been informed of this by your surgeon. However, Please do not submerge in a tub, hot tub, or pool until incisions are completely sealed or have been told by your surgeon that you may do so.  DIET:  You may eat any foods that you can tolerate.  It is a good idea to eat a high fiber diet and take in plenty of fluids to prevent constipation.  If you do become constipated you may want to take a mild laxative or take ducolax tablets on a daily basis until your bowel habits are regular.  Constipation can be very uncomfortable, along with straining, after recent surgery.  ACTIVITY:  You are encouraged to cough and deep breath or use your incentive spirometer if you were given one, every 15-30 minutes when awake.  This will help prevent respiratory complications and low grade fevers post-operatively if you had a general anesthetic.  You may want to hug a pillow when coughing and sneezing to add additional support to the surgical area, if you had  abdominal or chest surgery, which will decrease pain during these times.  You are encouraged to walk and engage in light activity for the next two weeks.  You should not lift more than 20 pounds for 6 weeks total after surgery as it could put you at increased risk for complications.  Twenty pounds is roughly equivalent to a plastic bag of groceries. At that time- Listen to your body when lifting, if you have pain when lifting, stop and then try again in a few days. Soreness after doing exercises or activities of daily living is normal as you get back in to your normal routine.  MEDICATIONS:  Try to take narcotic medications and anti-inflammatory medications, such as tylenol, ibuprofen, naprosyn, etc., with food.  This will minimize stomach upset from the medication.  Should you develop nausea and vomiting from the pain medication, or develop a rash, please discontinue the medication and contact your physician.  You should not drive, make important decisions, or operate machinery when taking narcotic pain medication.  SUNBLOCK Use sun block to incision area over the next year if this area will be exposed to sun. This helps decrease scarring and will allow you avoid a permanent darkened area over your incision.  QUESTIONS:  Please feel free to call our office if you have any questions, and we will be glad to assist you. [phone]

## 2023-06-04 DIAGNOSIS — Z4801 Encounter for change or removal of surgical wound dressing: Secondary | ICD-10-CM | POA: Insufficient documentation

## 2023-06-04 DIAGNOSIS — T8140XA Infection following a procedure, unspecified, initial encounter: Secondary | ICD-10-CM | POA: Insufficient documentation

## 2023-06-05 ENCOUNTER — Other Ambulatory Visit: Payer: Self-pay

## 2023-06-05 ENCOUNTER — Emergency Department
Admission: EM | Admit: 2023-06-05 | Discharge: 2023-06-05 | Disposition: A | Discharge status: Home / Self Care | Attending: Emergency Medicine | Admitting: Emergency Medicine

## 2023-06-05 DIAGNOSIS — T8140XA Infection following a procedure, unspecified, initial encounter: Secondary | ICD-10-CM

## 2023-06-05 DIAGNOSIS — Z5189 Encounter for other specified aftercare: Secondary | ICD-10-CM

## 2023-06-05 LAB — CBC WITH DIFFERENTIAL/PLATELET
Abs Immature Granulocytes: 0.04 10*3/uL (ref 0.00–0.07)
Basophils Absolute: 0.1 10*3/uL (ref 0.0–0.1)
Basophils Relative: 1 %
Eosinophils Absolute: 0.2 10*3/uL (ref 0.0–0.5)
Eosinophils Relative: 3 %
HCT: 30.6 % — ABNORMAL LOW (ref 36.0–46.0)
Hemoglobin: 10.1 g/dL — ABNORMAL LOW (ref 12.0–15.0)
Immature Granulocytes: 1 %
Lymphocytes Relative: 17 %
Lymphs Abs: 1.2 10*3/uL (ref 0.7–4.0)
MCH: 31.8 pg (ref 26.0–34.0)
MCHC: 33 g/dL (ref 30.0–36.0)
MCV: 96.2 fL (ref 80.0–100.0)
Monocytes Absolute: 0.3 10*3/uL (ref 0.1–1.0)
Monocytes Relative: 5 %
Neutro Abs: 5 10*3/uL (ref 1.7–7.7)
Neutrophils Relative %: 73 %
Platelets: 372 10*3/uL (ref 150–400)
RBC: 3.18 MIL/uL — ABNORMAL LOW (ref 3.87–5.11)
RDW: 13.1 % (ref 11.5–15.5)
WBC: 6.8 10*3/uL (ref 4.0–10.5)
nRBC: 0 % (ref 0.0–0.2)

## 2023-06-05 LAB — COMPREHENSIVE METABOLIC PANEL
ALT: 17 U/L (ref 0–44)
AST: 22 U/L (ref 15–41)
Albumin: 3.1 g/dL — ABNORMAL LOW (ref 3.5–5.0)
Alkaline Phosphatase: 76 U/L (ref 38–126)
Anion gap: 10 (ref 5–15)
BUN: 29 mg/dL — ABNORMAL HIGH (ref 8–23)
CO2: 21 mmol/L — ABNORMAL LOW (ref 22–32)
Calcium: 8.6 mg/dL — ABNORMAL LOW (ref 8.9–10.3)
Chloride: 105 mmol/L (ref 98–111)
Creatinine, Ser: 1.35 mg/dL — ABNORMAL HIGH (ref 0.44–1.00)
GFR, Estimated: 42 mL/min — ABNORMAL LOW (ref >60)
Glucose, Bld: 134 mg/dL — ABNORMAL HIGH (ref 70–99)
Potassium: 3.7 mmol/L (ref 3.5–5.1)
Sodium: 136 mmol/L (ref 135–145)
Total Bilirubin: 0.6 mg/dL (ref 0.0–1.2)
Total Protein: 6.6 g/dL (ref 6.5–8.1)

## 2023-06-05 LAB — LACTIC ACID, PLASMA: Lactic Acid, Venous: 1.1 mmol/L (ref 0.5–1.9)

## 2023-06-05 MED ORDER — CEPHALEXIN 500 MG PO CAPS: 500.0000 mg | ORAL_CAPSULE | Freq: Four times a day (QID) | ORAL | 0 refills | Status: AC

## 2023-06-05 MED ORDER — CEFAZOLIN SODIUM-DEXTROSE 2-4 GM/100ML-% IV SOLN
2.0000 g | Freq: Once | INTRAVENOUS | Status: AC
  Administered 2023-06-05: 2 g via INTRAVENOUS
  Filled 2023-06-05: qty 100

## 2023-06-05 NOTE — ED Triage Notes (Signed)
 Pt arrived POV for Post-op problems, cholecystectomy 3/4, now is having the glue come off her incisions, and swelling with redness. Some purulent drainage as well. Denies fevers. Some discomfort 4/10 pain, otherwise VSS, NAD noted.

## 2023-06-05 NOTE — ED Provider Notes (Signed)
 [facility] Provider Note    Event Date/Time   First MD Initiated Contact with Patient 06/05/23 0010     (approximate)   History   Post-op Problem   HPI  Sarah Gordon is a 73 y.o. female   Past medical history of cholecystectomy last week performed by Dr. Elenor Legato on 05/24/2023, uncomplicated who presents with redness surrounding her port site on the left lower abdomen.  The glue fell out just a day ago she noticed some redness and irritation to the skin only.  She has no belly pain.  She has no fevers or chills.  She has no other acute medical complaints.   External Medical Documents Reviewed: Discharge summary from hospital stay on 05/26/2023 documenting history of cholecystectomy      Physical Exam   Triage Vital Signs: ED Triage Vitals  Encounter Vitals Group     BP 06/05/23 0002 127/84     Systolic BP Percentile --      Diastolic BP Percentile --      Pulse Rate 06/05/23 0002 83     Resp 06/05/23 0002 20     Temp 06/05/23 0002 98.2 F (36.8 C)     Temp Source 06/05/23 0002 Oral     SpO2 06/05/23 0002 97 %     Weight 06/05/23 0003 264 lb (119.7 kg)     Height 06/05/23 0003 5\' 6"  (1.676 m)     Head Circumference --      Peak Flow --      Pain Score 06/05/23 0003 4     Pain Loc --      Pain Education --      Exclude from Growth Chart --     Most recent vital signs: Vitals:   06/05/23 0021 06/05/23 0030  BP:  130/79  Pulse:  83  Resp:    Temp:    SpO2: 97% 100%    General: Awake, no distress.  CV:  Good peripheral perfusion.  Resp:  Normal effort.  Abd:  No distention.  Other:  The other incision sites appear clean dry and intact, though the left abdominal site has redness warmth looks cellulitic.  Deep palpation of the rest of the abdomen elicits no tenderness guarding or rigidity.  She looks nontoxic comfortable pleasant woman in no acute distress with normal vital signs and afebrile.     ED Results / Procedures / Treatments    Labs (all labs ordered are listed, but only abnormal results are displayed) Labs Reviewed  CBC WITH DIFFERENTIAL/PLATELET - Abnormal; Notable for the following components:      Result Value   RBC 3.18 (*)    Hemoglobin 10.1 (*)    HCT 30.6 (*)    All other components within normal limits  COMPREHENSIVE METABOLIC PANEL  LACTIC ACID, PLASMA  LACTIC ACID, PLASMA     I ordered and reviewed the above labs they are notable for white blood cell count is normal  PROCEDURES:  Critical Care performed: No  Procedures   MEDICATIONS ORDERED IN ED: Medications  ceFAZolin (ANCEF) IVPB 2g/100 mL premix (has no administration in time range)    IMPRESSION / MDM / ASSESSMENT AND PLAN / ED COURSE  I reviewed the triage vital signs and the nursing notes.                                Patient's presentation is most consistent  with acute presentation with potential threat to life or bodily function.  Differential diagnosis includes, but is not limited to, cellulitis, abscess, intra-abdominal infection, sepsis less likely   The patient is on the cardiac monitor to evaluate for evidence of arrhythmia and/or significant heart rate changes.  MDM:    Cellulitic changes to the single port site of the left side and this nontoxic well-appearing patient with no leukocytosis fever doubt sepsis.  Doubt deeper space infection given no tenderness to palpation.  Will treat with IV antibiotic here get labs, check lactate, discharged with oral antibiotics and have her follow-up with surgeon.  Return precautions given.       FINAL CLINICAL IMPRESSION(S) / ED DIAGNOSES   Final diagnoses:  Postoperative infection, unspecified type, initial encounter  Visit for wound check     Rx / DC Orders   ED Discharge Orders     None        Note:  This document was prepared using Dragon voice recognition software and may include unintentional dictation errors.    Pilar Jarvis, MD 06/05/23  [phone]

## 2023-06-05 NOTE — Discharge Instructions (Signed)
 Take antibiotics as prescribed and call Dr. Hurman Horn office in the morning to schedule follow-up appointment.  Thank you for choosing [place] for your health care today!  Please see your primary doctor this week for a follow up appointment.   If you have any new, worsening, or unexpected symptoms call your doctor right away or come back to the emergency department for reevaluation.  It was my pleasure to care for you today.   Daneil Dan Modesto Charon, MD

## 2023-06-07 ENCOUNTER — Ambulatory Visit: Attending: Otolaryngology

## 2023-06-07 DIAGNOSIS — G4733 Obstructive sleep apnea (adult) (pediatric): Secondary | ICD-10-CM | POA: Insufficient documentation

## 2023-06-07 DIAGNOSIS — R942 Abnormal results of pulmonary function studies: Secondary | ICD-10-CM | POA: Diagnosis not present

## 2023-06-08 ENCOUNTER — Encounter: Payer: Self-pay | Admitting: Physician Assistant

## 2023-06-08 ENCOUNTER — Ambulatory Visit (INDEPENDENT_AMBULATORY_CARE_PROVIDER_SITE_OTHER): Admitting: Physician Assistant

## 2023-06-08 ENCOUNTER — Telehealth (INDEPENDENT_AMBULATORY_CARE_PROVIDER_SITE_OTHER): Payer: Self-pay | Admitting: Nurse Practitioner

## 2023-06-08 VITALS — BP 118/78 | HR 101 | Temp 98.0°F | Ht 66.0 in | Wt 270.4 lb

## 2023-06-08 DIAGNOSIS — K810 Acute cholecystitis: Secondary | ICD-10-CM

## 2023-06-08 DIAGNOSIS — Z09 Encounter for follow-up examination after completed treatment for conditions other than malignant neoplasm: Secondary | ICD-10-CM

## 2023-06-08 DIAGNOSIS — K8000 Calculus of gallbladder with acute cholecystitis without obstruction: Secondary | ICD-10-CM

## 2023-06-08 NOTE — Telephone Encounter (Signed)
 LVM for pt TCB and schedule appt  bilateral sclero. auth # [id number] exp: 4.14.25 - 7.14.25 6 units total

## 2023-06-08 NOTE — Progress Notes (Signed)
 [facility] POST-OP OFFICE VISIT  06/08/2023  HPI: Sarah Gordon is a 73 y.o. female 15 days s/p robotic assisted laparoscopic cholecystectomy for gangrenous cholecystitis with Dr Everlene Farrier   She was seen on 03/12 for drain removal and had done well. She presented to the ED on 03/16 for concerns over one of her port sites and erythema. She was started on Keflex.   Today, she reports she is doing well. Her wound is still erythematous but improving. There is serous drainage present. She denied any abdominal pain, fever, chills, nausea, emesis. She continues to recover extremely well from her cholecystectomy. No issues with PO intake, no diarrhea.   Vital signs: BP 118/78   Pulse (!) 101   Temp 98 F (36.7 C) (Oral)   Ht 5\' 6"  (1.676 m)   Wt 270 lb 6.4 oz (122.7 kg)   SpO2 96%   BMI 43.64 kg/m    Physical Exam: Constitutional: Well appearing female, NAD Abdomen: Soft, non-tender, non-distended, no rebound/guarding Skin: Her left lateral port site appears to have dehisced on the superficial level, wound bed with fibrinous tissue, there is serous drainage present. Erythema appears to be improved from ED photos. There is no evidence of underlying abscess or necrosis. All of laparoscopic incisions are healing well, no erythema or drainage   Assessment/Plan: This is a 73 y.o. female 15 days s/p robotic assisted laparoscopic cholecystectomy for gangrenous cholecystitis with Dr Everlene Farrier   - Changes in wound appearance appear to be secondary to superficial dehiscence of the wound with serous drainage. There does not appear to be underlying abscess or necrosis. I do not think she warrants any additional imaging or intervention at this time. Agree with Abx and encouraged her to finish these as prescribed.   - Wound is in a skin fold on her abdomen and dressing with band-aids at home has been a challenge. I do think she will need to utilize gauze over top of this wound and in the skin fold  to ensure this stays clean and dry. Encouraged her to change daily and as needed. Okay to remove to shower   - I will see her again in ~2 weeks for recheck; She understands to call with questions/concerns in the interim  -- Lynden Oxford, PA-C [facility] 06/08/2023, 1:45 PM M-F: 7am - 4pm

## 2023-06-08 NOTE — Patient Instructions (Signed)
Sutured Wound Care Sutures are stitches that can be used to close wounds. Some stitches break down as they heal (absorbable). Other stitches need to be taken out by your doctor (nonabsorbable). Taking good care of your wound can help to prevent pain and infection. It can also help your wound heal more quickly. Follow instructions from your doctor about how to care for your sutured wound. Supplies needed: Soap and water. A clean, dry towel. Solution to clean your wound, if needed. A clean gauze or bandage (dressing), if needed. Antibiotic ointment, if told by your doctor. How to care for your sutured wound  Keep the wound fully dry for the first 24 hours or as long as told by your doctor. After 24-48 hours, you may shower or bathe as told by your doctor. Do not soak the wound or put the wound under water until the stitches have been taken out. After the first 24 hours, clean the wound once a day, or as often as your doctor tells you to. Take these steps: Wash and rinse the wound as told by your health care provider. Pat the wound dry with a clean towel. Do not rub the wound. After cleaning the wound, put a thin layer of antibiotic ointment on the wound as told by your doctor. This will help: Prevent infection. Keep the bandage from sticking to the wound. Follow instructions from your doctor about how to change your bandage. Make sure you: Wash your hands with soap and water for at least 20 seconds. If you cannot use soap and water, use hand sanitizer. Change your bandage at least once a day, or as often as told by your doctor. If your dressing gets wet or dirty, change it. Leavestitches in place for at least 2 weeks. If you have skin glue over your stitches, this should also stay in place for at least 2 weeks. Leave tape strips alone (if you have them) unless you are told to take them off. You may trim the edges of the tape strips if they curl up. Check your wound every day for signs of  infection. Watch for: Redness, swelling, or pain. Fluid or blood. New warmth, a rash, or hardness at the wound site. Pus or a bad smell. Have the stitches taken out as told by your doctor. Follow these instructions at home: Medicines Take or apply over-the-counter and prescription medicines only as told by your doctor. If you were prescribed an antibiotic medicine or ointment, take or apply it as told by your doctor. Do not stop using the antibiotic even if you start to feel better. General instructions Cover your wound with clothes or put sunscreen on when you are outside. Use a sunscreen of at least 30 SPF. Do not scratch or pick at your wound. Avoid stretching your wound. Raise the injured area above the level of your heart while you are sitting or lying down, if possible. Eat a diet that includes protein, vitamin A, and vitamin C. Doing this will help your wound heal. Drink enough fluid to keep your pee (urine) pale yellow. Keep all follow-up visits. Contact a doctor if: You were given a tetanus shot and you have any of the following at the site where the needle went in: Swelling. Very bad pain. Redness. Bleeding. Your wound breaks open. You see something coming out of your wound, such as wood or glass. You have any of these signs of infection in or around your wound: Redness, swelling, or pain. Fluid or blood.  Warmth. A new rash. Your wound feels hard. You have a fever. The skin near your wound changes color. You have pain that does not get better with medicine. You get numbness around the wound. Get help right away if: You have very bad swelling or more pain around your wound. You have pus or a bad smell coming from your wound. You have painful lumps near your wound or anywhere on your body. You have a red streak going away from your wound. The wound is on your hand or foot, and: Your fingers or toes look pale or blue. You cannot move a finger or toe as you used to  do. You have numbness that spreads down your hand, foot, fingers, or toes. Summary Sutures are stitches that are used to close wounds. Taking good care of your wound can help to prevent pain and infection. Keep the wound fully dry for the first 24 hours or for as long as told by your doctor. After 24-48 hours, you may shower or bathe as told by your doctor. This information is not intended to replace advice given to you by your health care provider. Make sure you discuss any questions you have with your health care provider. Document Revised: 07/14/2020 Document Reviewed: 07/14/2020 Elsevier Patient Education  2024 [employer].

## 2023-06-13 ENCOUNTER — Ambulatory Visit (INDEPENDENT_AMBULATORY_CARE_PROVIDER_SITE_OTHER): Admitting: Surgery

## 2023-06-13 VITALS — BP 119/79 | HR 80 | Temp 98.2°F | Ht 66.0 in | Wt 270.0 lb

## 2023-06-13 DIAGNOSIS — K810 Acute cholecystitis: Secondary | ICD-10-CM

## 2023-06-13 DIAGNOSIS — Z09 Encounter for follow-up examination after completed treatment for conditions other than malignant neoplasm: Secondary | ICD-10-CM

## 2023-06-13 NOTE — Patient Instructions (Signed)
 Minimally Invasive Cholecystectomy, Care After What can I expect after the procedure? After the procedure, it is common to: Have pain at the areas of surgery. You will be given medicines for pain. Vomit or feel like you may vomit. Feel fullness in the belly (bloating) or have pain in the shoulder. This comes from the gas that was used during the surgery. Follow these instructions at home: Medicines Take over-the-counter and prescription medicines only as told by your doctor. If you were prescribed an antibiotic medicine, take it as told by your doctor. Do not stop taking it even if you start to feel better. If told, take steps to prevent problems with pooping (constipation). You may need to: Drink enough fluid to keep your pee (urine) pale yellow. Take medicines. You will be told what medicines to take. Eat foods that are high in fiber. These include beans, whole grains, and fresh fruits and vegetables. Limit foods that are high in fat and sugar. These include fried or sweet foods. Ask your doctor if you should avoid driving or using machines while you are taking your medicine. Incision care  Follow instructions from your doctor about how to take care of your cuts from surgery (incisions). Make sure you: Wash your hands with soap and water for at least 20 seconds before and after you change your bandage (dressing). If you cannot use soap and water, use hand sanitizer. Change your bandage. Leave stitches (sutures) or skin glue in place for at least 2 weeks. Leave tape strips alone unless you are told to take them off. You may trim the edges of the tape strips if they curl up. Do not take baths, swim, or use a hot tub. Ask your doctor about taking showers or sponge baths. Check your incision area every day for signs of infection. Check for: More redness, swelling, or pain. Fluid or blood. Warmth. Pus or a bad smell. Activity Rest as told by your doctor. Do not do activities that require a  lot of effort. Get up to take short walks every 1 to 2 hours. Ask for help if you feel weak or unsteady. Do not lift anything that is heavier than 10 lb (4.5 kg), or the limit that you are told. Do not play contact sports until your doctor says it is okay. Do not return to work or school until your doctor says it is okay. Return to your normal activities when your doctor says that it is safe. General instructions If you were given a sedative during your procedure, do not drive or use machines until your doctor says that it is safe. A sedative is a medicine that helps you relax. Keep all follow-up visits. Contact a doctor if: You get a rash. You have more redness, swelling, or pain around your incisions. You have fluid or blood coming from your incisions. Your incisions feel warm to the touch. You have pus or a bad smell coming from your incisions. You have a fever. One or more of your incisions breaks open. Get help right away if: You have trouble breathing. You have chest pain. You have pain that is getting worse in your shoulders. You faint or feel dizzy when you stand. You have very bad pain in your belly (abdomen). You feel like you may vomit or you vomit, and this lasts for more than one day. You have leg pain. These symptoms may be an emergency. Get help right away. Call 911. Do not wait to see if the symptoms will  go away. Do not drive yourself to the hospital. Summary After your surgery, it is common to have pain at the areas of surgery. You may also vomit or feel fullness in the belly. Follow your doctor's instructions about medicine, activity restrictions, and caring for your surgery areas. Do not do activities that require a lot of effort. Contact a doctor if you have a fever or other signs of infection, such as more redness, swelling, or pain around your incisions. Get help right away if you have chest pain, increasing pain in the shoulders, or trouble breathing. This  information is not intended to replace advice given to you by your health care provider. Make sure you discuss any questions you have with your health care provider. Document Revised: 09/08/2020 Document Reviewed: 09/09/2020 Elsevier Patient Education  2024 [employer].

## 2023-06-16 ENCOUNTER — Ambulatory Visit: Payer: Medicare HMO

## 2023-06-16 VITALS — BP 119/79 | Ht 66.0 in | Wt 270.0 lb

## 2023-06-16 DIAGNOSIS — Z2821 Immunization not carried out because of patient refusal: Secondary | ICD-10-CM | POA: Diagnosis not present

## 2023-06-16 DIAGNOSIS — Z0000 Encounter for general adult medical examination without abnormal findings: Secondary | ICD-10-CM | POA: Diagnosis not present

## 2023-06-16 NOTE — Progress Notes (Signed)
 Because this visit was a virtual/telehealth visit,  certain criteria was not obtained, such a blood pressure, CBG if applicable, and timed get up and go. Any medications not marked as "taking" were not mentioned during the medication reconciliation part of the visit. Any vitals not documented were not able to be obtained due to this being a telehealth visit or patient was unable to self-report a recent blood pressure reading due to a lack of equipment at home via telehealth. Vitals that have been documented are verbally provided by the patient.   Subjective:   Sarah Gordon is a 73 y.o. who presents for a Medicare Wellness preventive visit.  Visit Complete: Virtual I connected with  Sarah Gordon on 06/16/23 by a audio enabled telemedicine application and verified that I am speaking with the correct person using two identifiers.  Patient Location: Home  Provider Location: Home Office  I discussed the limitations of evaluation and management by telemedicine. The patient expressed understanding and agreed to proceed.  Vital Signs: Because this visit was a virtual/telehealth visit, some criteria may be missing or patient reported. Any vitals not documented were not able to be obtained and vitals that have been documented are patient reported.  VideoDeclined- This patient declined Librarian, academic. Therefore the visit was completed with audio only.  Persons Participating in Visit: Patient.  AWV Questionnaire: No: Patient Medicare AWV questionnaire was not completed prior to this visit.  Cardiac Risk Factors include: advanced age (>68men, >57 women);diabetes mellitus;hypertension     Objective:    Today's Vitals   06/16/23 1006 06/16/23 1007  BP: 119/79   Weight: 270 lb (122.5 kg)   Height: 5\' 6"  (1.676 m)   PainSc:  0-No pain   Body mass index is 43.58 kg/m.     06/16/2023   10:06 AM 06/05/2023   12:04 AM 05/24/2023    1:11 PM 05/17/2023    7:41 AM  02/01/2023    6:36 AM 01/18/2023    7:54 AM 06/04/2022   11:06 AM  Advanced Directives  Does Patient Have a Medical Advance Directive? No No No No No No No  Would patient like information on creating a medical advance directive? No - Patient declined  No - Patient declined  Yes (MAU/Ambulatory/Procedural Areas - Information given) Yes (MAU/Ambulatory/Procedural Areas - Information given)     Current Medications (verified) Outpatient Encounter Medications as of 06/16/2023  Medication Sig   acetaminophen (TYLENOL) 500 MG tablet Take 1,000 mg by mouth every 6 (six) hours as needed for moderate pain (pain score 4-6).   albuterol (VENTOLIN HFA) 108 (90 Base) MCG/ACT inhaler Inhale 2 puffs into the lungs every 6 (six) hours as needed for wheezing or shortness of breath.   buPROPion (WELLBUTRIN XL) 150 MG 24 hr tablet Take 1 tablet (150 mg total) by mouth every morning.   DULoxetine (CYMBALTA) 60 MG capsule Take 1 capsule (60 mg total) by mouth daily.   Evolocumab (REPATHA) 140 MG/ML SOSY Inject 140 mg into the skin every 14 (fourteen) days.   ezetimibe (ZETIA) 10 MG tablet Take 1 tablet (10 mg total) by mouth daily.   folic acid (FOLVITE) 800 MCG tablet Take 800 mcg by mouth daily.   hydrOXYzine (ATARAX) 10 MG tablet Take 10 mg by mouth 3 (three) times daily as needed for anxiety.   hyoscyamine (LEVSIN/SL) 0.125 MG SL tablet Place 1 tablet (0.125 mg total) under the tongue every 4 (four) hours as needed.   levothyroxine (SYNTHROID) 100  MCG tablet TAKE 1 TABLET BY MOUTH DAILY   metoprolol succinate (TOPROL-XL) 25 MG 24 hr tablet Take 1 tablet (25 mg total) by mouth daily.   montelukast (SINGULAIR) 10 MG tablet Take 1 tablet (10 mg total) by mouth at bedtime.   Multiple Vitamin (MULTIVITAMIN) tablet Take 1 tablet by mouth daily.   olmesartan-hydrochlorothiazide (BENICAR HCT) 40-12.5 MG tablet Take 1 tablet by mouth daily.   omeprazole (PRILOSEC) 20 MG capsule Take 1 capsule (20 mg total) by mouth 2  (two) times daily before a meal.   ondansetron (ZOFRAN) 4 MG tablet Take 1 tablet (4 mg total) by mouth every 8 (eight) hours as needed for nausea or vomiting.   Polyethyl Glycol-Propyl Glycol (SYSTANE OP) Place 1 drop into both eyes daily as needed (dry eyes).   QUEtiapine (SEROQUEL) 25 MG tablet Take 1 tablet (25 mg total) by mouth at bedtime.   rOPINIRole (REQUIP) 1 MG tablet Take 1 tablet (1 mg total) by mouth at bedtime.   tiZANidine (ZANAFLEX) 4 MG tablet Take 1 tablet (4 mg total) by mouth at bedtime as needed for muscle spasms.   [DISCONTINUED] Fluticasone-Salmeterol (ADVAIR DISKUS) 250-50 MCG/DOSE AEPB Inhale 1 puff into the lungs 2 (two) times daily.   No facility-administered encounter medications on file as of 06/16/2023.    Allergies (verified) Vascepa [icosapent ethyl (epa ethyl ester) (fish)], Aspir-81 [aspirin], Gabapentin, Metformin and related, Mounjaro [tirzepatide], Peanut-containing drug products, Statins, Topamax [topiramate], Voltaren [diclofenac sodium], and Lipitor [atorvastatin]   History: Past Medical History:  Diagnosis Date   Allergy    Anxiety '95   Asthma    Cataract    Depression '95   Esophageal reflux    Gouty arthropathy, unspecified    Herpes simplex without mention of complication    Irritable bowel syndrome    Localized osteoarthrosis not specified whether primary or secondary, unspecified site    Migraine, unspecified, without mention of intractable migraine without mention of status migrainosus    Mild cognitive impairment    Mixed hyperlipidemia    Myalgia and myositis, unspecified    Obstructive sleep apnea (adult) (pediatric)    Palpitations    Sleep apnea    No CPAP   Type II or unspecified type diabetes mellitus without mention of complication, not stated as uncontrolled    Diet controlled   Umbilical hernia without mention of obstruction or gangrene    Unspecified essential hypertension    Unspecified hypothyroidism    Past  Surgical History:  Procedure Laterality Date   BREAST BIOPSY Left    benign   CATARACT EXTRACTION W/PHACO Left 01/18/2023   Procedure: CATARACT EXTRACTION PHACO AND INTRAOCULAR LENS PLACEMENT (IOC) LEFT DIABETIC  CLAREON VIVITY TORIC LENS;  Surgeon: Galen Manila, MD;  Location: [facility];  Service: Ophthalmology;  Laterality: Left;  4.47 0:31.9   CATARACT EXTRACTION W/PHACO Right 02/01/2023   Procedure: CATARACT EXTRACTION PHACO AND INTRAOCULAR LENS PLACEMENT (IOC) RIGHT DIABETIC  CLAREON VIVITY TORIC LENS 3.68 00:28.9;  Surgeon: Galen Manila, MD;  Location: [facility];  Service: Ophthalmology;  Laterality: Right;   CHOLECYSTECTOMY     COLONOSCOPY     Dr Thurmond Butts   COLONOSCOPY WITH PROPOFOL N/A 11/19/2015   Procedure: COLONOSCOPY WITH PROPOFOL;  Surgeon: Kieth Brightly, MD;  Location: [facility] ENDOSCOPY;  Service: Endoscopy;  Laterality: N/A;   COLONOSCOPY WITH PROPOFOL N/A 06/18/2021   Procedure: COLONOSCOPY WITH PROPOFOL;  Surgeon: Toney Reil, MD;  Location: [facility] ENDOSCOPY;  Service: Gastroenterology;  Laterality: N/A;  ESOPHAGOGASTRODUODENOSCOPY N/A 08/20/2014   Procedure: ESOPHAGOGASTRODUODENOSCOPY (EGD);  Surgeon: Kieth Brightly, MD;  Location: [facility];  Service: Endoscopy;  Laterality: N/A;   ESOPHAGOGASTRODUODENOSCOPY (EGD) WITH PROPOFOL N/A 06/18/2021   Procedure: ESOPHAGOGASTRODUODENOSCOPY (EGD) WITH PROPOFOL;  Surgeon: Toney Reil, MD;  Location: [facility] ENDOSCOPY;  Service: Gastroenterology;  Laterality: N/A;   FOOT SURGERY Left    heel spur   HERNIA REPAIR     umbilical   NASAL SINUS SURGERY  1980s   UMBILICAL HERNIA REPAIR     Family History  Problem Relation Age of Onset   Hypertension Mother    Hyperlipidemia Mother    Diabetes Mother    Heart disease Mother    Stroke Mother    Varicose Veins Mother    Alcohol abuse Father    Lupus Sister    Arthritis Sister    Depression Sister    Obesity Sister    Asthma  Sister    Depression Sister    Hypertension Sister    Miscarriages / Stillbirths Sister    Varicose Veins Sister    Depression Sister    Arthritis Sister    Heart disease Sister    Hyperlipidemia Sister    Hypertension Sister    Miscarriages / Stillbirths Sister    Varicose Veins Sister    Miscarriages / Stillbirths Sister    Arthritis Sister    Depression Sister    Heart disease Sister    Hyperlipidemia Sister    Hypertension Sister    Heart disease Brother 70       CABG x 3    Heart attack Brother 83   Hyperlipidemia Brother    Hypertension Brother    Diabetes Brother    Hypertension Brother    Obesity Brother    Asthma Brother    Depression Brother    Hyperlipidemia Brother    Alcohol abuse Brother    Heart disease Brother    Stroke Brother    Varicose Veins Brother    Early death Brother    Stroke Brother    Arthritis Brother    Depression Brother    Hyperlipidemia Brother    Hypertension Brother    Stroke Maternal Grandfather    Breast cancer Neg Hx    Social History   Socioeconomic History   Marital status: Married    Spouse name: Harrell Gave    Number of children: 1   Years of education: Not on file   Highest education level: Some college, no degree  Occupational History    Comment: works part time at [employer]  Tobacco Use   Smoking status: Never    Passive exposure: Never   Smokeless tobacco: Never  Vaping Use   Vaping status: Never Used  Substance and Sexual Activity   Alcohol use: No   Drug use: No   Sexual activity: Not Currently    Birth control/protection: Post-menopausal  Other Topics Concern   Not on file  Social History Narrative   Not on file   Social Drivers of Health   Financial Resource Strain: Low Risk  (06/16/2023)   Overall Financial Resource Strain (CARDIA)    Difficulty of Paying Living Expenses: Not very hard  Food Insecurity: No Food Insecurity (06/16/2023)   Hunger Vital Sign    Worried About Running Out of Food in the Last  Year: Never true    Ran Out of Food in the Last Year: Never true  Transportation Needs: No Transportation Needs (06/16/2023)   PRAPARE - Transportation  Lack of Transportation (Medical): No    Lack of Transportation (Non-Medical): No  Recent Concern: Transportation Needs - Unmet Transportation Needs (05/08/2023)   PRAPARE - Transportation    Lack of Transportation (Medical): No    Lack of Transportation (Non-Medical): Yes  Physical Activity: Insufficiently Active (06/16/2023)   Exercise Vital Sign    Days of Exercise per Week: 2 days    Minutes of Exercise per Session: 10 min  Stress: No Stress Concern Present (06/16/2023)   [employer] of Occupational Health - Occupational Stress Questionnaire    Feeling of Stress : Only a little  Recent Concern: Stress - Stress Concern Present (05/08/2023)   [employer] of Occupational Health - Occupational Stress Questionnaire    Feeling of Stress : To some extent  Social Connections: Socially Integrated (06/16/2023)   Social Connection and Isolation Panel [NHANES]    Frequency of Communication with Friends and Family: More than three times a week    Frequency of Social Gatherings with Friends and Family: Three times a week    Attends Religious Services: More than 4 times per year    Active Member of Clubs or Organizations: Yes    Attends Engineer, structural: More than 4 times per year    Marital Status: Married    Tobacco Counseling Counseling given: Not Answered    Clinical Intake:  Pre-visit preparation completed: Yes  Pain : No/denies pain Pain Score: 0-No pain     BMI - recorded: 43.58 Nutritional Status: BMI > 30  Obese Nutritional Risks: None Diabetes: Yes CBG done?: No Did pt. bring in CBG monitor from home?: No  Lab Results  Component Value Date   HGBA1C 6.5 (H) 05/25/2023   HGBA1C 6.4 (A) 05/09/2023   HGBA1C 6.3 (A) 01/04/2023     What is the last grade level you completed in school?: some  college  Interpreter Needed?: No  Information entered by :: [employer]   Activities of Daily Living     06/16/2023   10:13 AM 05/24/2023    4:30 PM  In your present state of health, do you have any difficulty performing the following activities:  Hearing? 0   Vision? 0   Difficulty concentrating or making decisions? 0   Walking or climbing stairs? 0   Dressing or bathing? 0   Doing errands, shopping? 0 0  Preparing Food and eating ? N   Using the Toilet? N   In the past six months, have you accidently leaked urine? Y   Do you have problems with loss of bowel control? N   Managing your Medications? N   Managing your Finances? N   Housekeeping or managing your Housekeeping? N     Patient Care Team: Alba Cory, MD as PCP - General (Family Medicine) Pa, [facility] as Consulting Physician (Optometry)  Indicate any recent Medical Services you may have received from other than [facility] providers in the past year (date may be approximate).     Assessment:   This is a routine wellness examination for Sarah Gordon.  Hearing/Vision screen Hearing Screening - Comments:: No issues with hearing Vision Screening - Comments:: Patient has better vision after surgery  [facility]    Goals Addressed               This Visit's Progress     Patient Stated (pt-stated)        Patient would like energy back after surgery       Depression  Screen     06/16/2023   10:18 AM 05/09/2023    8:47 AM 03/31/2023    2:52 PM 01/04/2023   10:16 AM 10/08/2022    8:34 AM 09/01/2022    8:25 AM 07/19/2022    8:36 AM  PHQ 2/9 Scores  PHQ - 2 Score 0 2 0 2 0 2 2  PHQ- 9 Score 0 5 0 7 0 4 4    Fall Risk     06/16/2023   10:12 AM 05/23/2023    9:53 AM 05/09/2023    8:46 AM 03/31/2023    2:52 PM 01/04/2023   10:10 AM  Fall Risk   Falls in the past year? 1 0 1 0 0  Number falls in past yr: 0 0 0 0   Injury with Fall? 0 0 0 0   Risk for fall due to : Impaired balance/gait   Impaired balance/gait No Fall Risks Impaired balance/gait  Follow up Falls evaluation completed  Falls prevention discussed;Education provided;Falls evaluation completed Falls prevention discussed;Education provided;Falls evaluation completed Falls prevention discussed;Education provided;Falls evaluation completed    MEDICARE RISK AT HOME:  Medicare Risk at Home Any stairs in or around the home?: No If so, are there any without handrails?: No Home free of loose throw rugs in walkways, pet beds, electrical cords, etc?: Yes Adequate lighting in your home to reduce risk of falls?: Yes Life alert?: No Use of a cane, walker or w/c?: Yes (walker at home) Grab bars in the bathroom?: Yes Shower chair or bench in shower?: No Elevated toilet seat or a handicapped toilet?: No  TIMED UP AND GO:  Was the test performed?  No  Cognitive Function: 6CIT completed    10/27/2021    8:33 AM  MMSE - Mini Mental State Exam  Orientation to time 5  Orientation to Place 5  Registration 3  Attention/ Calculation 5  Recall 3  Language- name 2 objects 2  Language- repeat 1  Language- follow 3 step command 3  Language- read & follow direction 1  Write a sentence 1  Copy design 1  Total score 30        06/16/2023   10:09 AM 06/04/2022   11:08 AM 05/24/2019    9:40 AM  6CIT Screen  What Year? 0 points 0 points 0 points  What month? 0 points 0 points 0 points  What time? 0 points 0 points 0 points  Count back from 20 2 points 0 points 0 points  Months in reverse 0 points 0 points 0 points  Repeat phrase 0 points 0 points 0 points  Total Score 2 points 0 points 0 points    Immunizations Immunization History  Administered Date(s) Administered   Fluad Quad(high Dose 65+) 12/06/2018, 01/22/2020, 11/25/2020, 11/26/2022   Hepatitis B, ADULT 02/27/2018   Hepb-cpg 12/06/2018, 10/10/2020   Influenza, High Dose Seasonal PF 02/27/2018   Influenza-Unspecified 01/03/2022   PFIZER(Purple Top)SARS-COV-2  Vaccination 04/26/2019, 05/22/2019, 12/24/2019, 10/18/2020   Pfizer Covid-19 Vaccine Bivalent Booster 62yrs & up 10/09/2021   Pfizer(Comirnaty)Fall Seasonal Vaccine 12 years and older 11/26/2022   Pneumococcal Conjugate-13 07/30/2013   Pneumococcal Polysaccharide-23 10/12/2011, 02/27/2018   RSV,unspecified 01/01/2022   Respiratory Syncytial Virus Vaccine,Recomb Aduvanted(Arexvy) 01/02/2022   Tdap 10/12/2011   Zoster Recombinant(Shingrix) 10/18/2020, 10/09/2021   Zoster, Live 08/06/2010    Screening Tests Health Maintenance  Topic Date Due   COVID-19 Vaccine (7 - 2024-25 season) 05/26/2023   Diabetic kidney evaluation - Urine ACR  08/20/2023  HEMOGLOBIN A1C  11/25/2023   OPHTHALMOLOGY EXAM  12/30/2023   FOOT EXAM  05/08/2024   Diabetic kidney evaluation - eGFR measurement  06/04/2024   Medicare Annual Wellness (AWV)  06/15/2024   MAMMOGRAM  08/23/2024   Colonoscopy  06/19/2026   Pneumonia Vaccine 60+ Years old  Completed   INFLUENZA VACCINE  Completed   DEXA SCAN  Completed   Hepatitis C Screening  Completed   Zoster Vaccines- Shingrix  Completed   HPV VACCINES  Aged Out   DTaP/Tdap/Td  Discontinued    Health Maintenance  Health Maintenance Due  Topic Date Due   COVID-19 Vaccine (7 - 2024-25 season) 05/26/2023   Health Maintenance Items Addressed:  Additional Screening:  Vision Screening: Recommended annual ophthalmology exams for early detection of glaucoma and other disorders of the eye.  Dental Screening: Recommended annual dental exams for proper oral hygiene  Community Resource Referral / Chronic Care Management: CRR required this visit?  No   CCM required this visit?  No     Plan:     I have personally reviewed and noted the following in the patient's chart:   Medical and social history Use of alcohol, tobacco or illicit drugs  Current medications and supplements including opioid prescriptions. Patient is not currently taking opioid  prescriptions. Functional ability and status Nutritional status Physical activity Advanced directives List of other physicians Hospitalizations, surgeries, and ER visits in previous 12 months Vitals Screenings to include cognitive, depression, and falls Referrals and appointments  In addition, I have reviewed and discussed with patient certain preventive protocols, quality metrics, and best practice recommendations. A written personalized care plan for preventive services as well as general preventive health recommendations were provided to patient.     Rudi Heap, [place]   06/16/2023   After Visit Summary: (MyChart) Due to this being a telephonic visit, the after visit summary with patients personalized plan was offered to patient via MyChart   Notes: Nothing significant to report at this time.

## 2023-06-16 NOTE — Patient Instructions (Signed)
 Sarah Gordon , Thank you for taking time to come for your Medicare Wellness Visit. I appreciate your ongoing commitment to your health goals. Please review the following plan we discussed and let me know if I can assist you in the future.   Referrals/Orders/Follow-Ups/Clinician Recommendations:   This is a list of the screening recommended for you and due dates:  Health Maintenance  Topic Date Due   COVID-19 Vaccine (7 - 2024-25 season) 05/26/2023   Yearly kidney health urinalysis for diabetes  08/20/2023   Hemoglobin A1C  11/25/2023   Eye exam for diabetics  12/30/2023   Complete foot exam   05/08/2024   Yearly kidney function blood test for diabetes  06/04/2024   Medicare Annual Wellness Visit  06/15/2024   Mammogram  08/23/2024   Colon Cancer Screening  06/19/2026   Pneumonia Vaccine  Completed   Flu Shot  Completed   DEXA scan (bone density measurement)  Completed   Hepatitis C Screening  Completed   Zoster (Shingles) Vaccine  Completed   HPV Vaccine  Aged Out   DTaP/Tdap/Td vaccine  Discontinued    Advanced directives: (Declined) Advance directive discussed with you today. Even though you declined this today, please call our office should you change your mind, and we can give you the proper paperwork for you to fill out.  Next Medicare Annual Wellness Visit scheduled for next year: yes

## 2023-06-16 NOTE — Progress Notes (Signed)
 Sarah Gordon is 3 weeks out from robotic cholecystectomy.  She has done really well.  Developed some superficial dehiscence.  No fevers no chills.  She has improved dramatically  PE NAD  Abd: soft, nt incision healing well, Left lateral incision with superficial dehiscence without evidence of infection  A/P very well considering her disease process and body habitus.  Not unexpected there is a small dehiscence that should heal with time.  No evidence of infection no evidence of complications I did reassure the patient about benign findings

## 2023-06-22 ENCOUNTER — Encounter: Admitting: Physician Assistant

## 2023-06-24 ENCOUNTER — Encounter: Payer: Self-pay | Admitting: Family Medicine

## 2023-06-28 ENCOUNTER — Telehealth (INDEPENDENT_AMBULATORY_CARE_PROVIDER_SITE_OTHER): Payer: Self-pay | Admitting: Nurse Practitioner

## 2023-06-28 NOTE — Telephone Encounter (Signed)
 LVM for pt TCB and schedule appt  bilateral sclero. auth # [id number] exp: 4.14.25 - 7.14.25 6 units total

## 2023-06-29 ENCOUNTER — Encounter: Payer: Self-pay | Admitting: Family Medicine

## 2023-06-29 ENCOUNTER — Ambulatory Visit (INDEPENDENT_AMBULATORY_CARE_PROVIDER_SITE_OTHER): Admitting: Family Medicine

## 2023-06-29 VITALS — BP 120/74 | HR 92 | Resp 16 | Ht 66.0 in | Wt 271.8 lb

## 2023-06-29 DIAGNOSIS — Z9049 Acquired absence of other specified parts of digestive tract: Secondary | ICD-10-CM | POA: Diagnosis not present

## 2023-06-29 DIAGNOSIS — G2581 Restless legs syndrome: Secondary | ICD-10-CM

## 2023-06-29 MED ORDER — ROPINIROLE HCL 2 MG PO TABS: 2.0000 mg | ORAL_TABLET | Freq: Every day | ORAL | 0 refills | Status: DC

## 2023-06-29 NOTE — Progress Notes (Signed)
 Name: Sarah Gordon   MRN: [id number]    DOB: [birth year]   Date:06/29/2023       Progress Note  Subjective  Chief Complaint  Chief Complaint  Patient presents with   Spasms    Muscle spasms in both legs, on going for 2 months on/off   Discussed the use of AI scribe software for clinical note transcription with the patient, who gave verbal consent to proceed.  History of Present Illness LUMI WINSLETT "[name]" is a 73 year old female with restless leg syndrome who presents with worsening symptoms.  She experiences worsening symptoms of restless leg syndrome, characterized by a sensation of 'crawling' or muscle tightening in her legs, followed by involuntary jerking movements. These symptoms occur primarily at night, just before she falls asleep, and sometimes persist even after she moves her legs. She is currently taking ropinirole (Requip) at a dose of 1 mg and has tizanidine prescribed for muscle spasms, although it is not specifically for restless legs. Her symptoms have worsened since her recent gallbladder surgery.  Following her recent gallbladder surgery, she was noted to be slightly anemic post-operatively. Her symptoms of restless legs were present before the surgery but have become more pronounced since then. She does not experience symptoms during the day, and they primarily occur in the evening before sleep. She feels generally well post-surgery but has had a few episodes of stomach discomfort after eating, particularly when consuming heavy or greasy foods.  There is a non-healing area at the surgical site, previously treated with antibiotics for infection. The site is located in a crease, which may be contributing to delayed healing due to moisture.    Patient Active Problem List   Diagnosis Date Noted   Calculus of gallbladder with acute cholecystitis without obstruction 05/24/2023   Acute cholecystitis 05/24/2023   Background diabetic retinopathy (HCC) 01/03/2023   Varicose  veins with inflammation 07/16/2022   Cognitive complaints 10/27/2021   Polyp of colon    Lumbar radiculopathy 11/06/2020   Ventricular flutter (HCC) 12/06/2018   MDD (major depressive disorder), recurrent episode, mild (HCC) 09/04/2018   DDD (degenerative disc disease), cervical 01/23/2016   Degenerative joint disease of left acromioclavicular joint 01/23/2016   Recurrent oral herpes simplex infection 09/01/2015   Irritable bowel syndrome with diarrhea 08/07/2015   Statin intolerance 02/28/2015   Asthma, intermittent 09/10/2014   Controlled type 2 diabetes with neuropathy (HCC) 09/10/2014   Fibromyalgia 09/10/2014   Obstructive sleep apnea 09/10/2014   Hyperlipidemia 12/29/2012   Essential hypertension 12/29/2012   Morbid obesity (HCC) 12/29/2012   Arthritis due to gout 12/13/2007   Acid reflux 10/24/2006   Adult hypothyroidism 10/24/2006   Localized osteoarthrosis 10/24/2006    Past Surgical History:  Procedure Laterality Date   BREAST BIOPSY Left    benign   CATARACT EXTRACTION W/PHACO Left 01/18/2023   Procedure: CATARACT EXTRACTION PHACO AND INTRAOCULAR LENS PLACEMENT (IOC) LEFT DIABETIC  CLAREON VIVITY TORIC LENS;  Surgeon: Galen Manila, MD;  Location: [facility];  Service: Ophthalmology;  Laterality: Left;  4.47 0:31.9   CATARACT EXTRACTION W/PHACO Right 02/01/2023   Procedure: CATARACT EXTRACTION PHACO AND INTRAOCULAR LENS PLACEMENT (IOC) RIGHT DIABETIC  CLAREON VIVITY TORIC LENS 3.68 00:28.9;  Surgeon: Galen Manila, MD;  Location: [facility];  Service: Ophthalmology;  Laterality: Right;   CHOLECYSTECTOMY     COLONOSCOPY     Dr Thurmond Butts   COLONOSCOPY WITH PROPOFOL N/A 11/19/2015   Procedure: COLONOSCOPY WITH PROPOFOL;  Surgeon: Kieth Brightly,  MD;  Location: [facility] END[facility]COPY;  Service: Endoscopy;  Laterality: N/A;   COLONOSCOPY WITH PROPOFOL N/A 06/18/2021   Procedure: COLONOSCOPY WITH PROPOFOL;  Surgeon: Toney Reil, MD;  Location:  [facility] END[facility]COPY;  Service: Gastroenterology;  Laterality: N/A;   ESOPHAGOGASTRODUODENOSCOPY N/A 08/20/2014   Procedure: ESOPHAGOGASTRODUODENOSCOPY (EGD);  Surgeon: Kieth Brightly, MD;  Location: [facility]COPY;  Service: Endoscopy;  Laterality: N/A;   ESOPHAGOGASTRODUODENOSCOPY (EGD) WITH PROPOFOL N/A 06/18/2021   Procedure: ESOPHAGOGASTRODUODENOSCOPY (EGD) WITH PROPOFOL;  Surgeon: Toney Reil, MD;  Location: [facility] ENDOSCOPY;  Service: Gastroenterology;  Laterality: N/A;   FOOT SURGERY Left    heel spur   HERNIA REPAIR     umbilical   NASAL SINUS SURGERY  1980s   UMBILICAL HERNIA REPAIR      Family History  Problem Relation Age of Onset   Hypertension Mother    Hyperlipidemia Mother    Diabetes Mother    Heart disease Mother    Stroke Mother    Varicose Veins Mother    Alcohol abuse Father    Lupus Sister    Arthritis Sister    Depression Sister    Obesity Sister    Asthma Sister    Depression Sister    Hypertension Sister    Miscarriages / Stillbirths Sister    Varicose Veins Sister    Depression Sister    Arthritis Sister    Heart disease Sister    Hyperlipidemia Sister    Hypertension Sister    Miscarriages / Stillbirths Sister    Varicose Veins Sister    Miscarriages / Stillbirths Sister    Arthritis Sister    Depression Sister    Heart disease Sister    Hyperlipidemia Sister    Hypertension Sister    Heart disease Brother 53       CABG x 3    Heart attack Brother 47   Hyperlipidemia Brother    Hypertension Brother    Diabetes Brother    Hypertension Brother    Obesity Brother    Asthma Brother    Depression Brother    Hyperlipidemia Brother    Alcohol abuse Brother    Heart disease Brother    Stroke Brother    Varicose Veins Brother    Early death Brother    Stroke Brother    Arthritis Brother    Depression Brother    Hyperlipidemia Brother    Hypertension Brother    Stroke Maternal Grandfather    Breast cancer Neg Hx     Social  History   Tobacco Use   Smoking status: Never    Passive exposure: Never   Smokeless tobacco: Never  Substance Use Topics   Alcohol use: No     Current Outpatient Medications:    acetaminophen (TYLENOL) 500 MG tablet, Take 1,000 mg by mouth every 6 (six) hours as needed for moderate pain (pain score 4-6)., Disp: , Rfl:    albuterol (VENTOLIN HFA) 108 (90 Base) MCG/ACT inhaler, Inhale 2 puffs into the lungs every 6 (six) hours as needed for wheezing or shortness of breath., Disp: 1 each, Rfl: 2   buPROPion (WELLBUTRIN XL) 150 MG 24 hr tablet, Take 1 tablet (150 mg total) by mouth every morning., Disp: 90 tablet, Rfl: 1   DULoxetine (CYMBALTA) 60 MG capsule, Take 1 capsule (60 mg total) by mouth daily., Disp: 90 capsule, Rfl: 1   Evolocumab (REPATHA) 140 MG/ML SOSY, Inject 140 mg into the skin every 14 (fourteen) days., Disp: 6.3 mL, Rfl:  2   ezetimibe (ZETIA) 10 MG tablet, Take 1 tablet (10 mg total) by mouth daily., Disp: 90 tablet, Rfl: 1   folic acid (FOLVITE) 800 MCG tablet, Take 800 mcg by mouth daily., Disp: , Rfl:    hydrOXYzine (ATARAX) 10 MG tablet, Take 10 mg by mouth 3 (three) times daily as needed for anxiety., Disp: , Rfl:    levothyroxine (SYNTHROID) 100 MCG tablet, TAKE 1 TABLET BY MOUTH DAILY, Disp: 90 tablet, Rfl: 1   metoprolol succinate (TOPROL-XL) 25 MG 24 hr tablet, Take 1 tablet (25 mg total) by mouth daily., Disp: 90 tablet, Rfl: 1   montelukast (SINGULAIR) 10 MG tablet, Take 1 tablet (10 mg total) by mouth at bedtime., Disp: 90 tablet, Rfl: 1   Multiple Vitamin (MULTIVITAMIN) tablet, Take 1 tablet by mouth daily., Disp: , Rfl:    olmesartan-hydrochlorothiazide (BENICAR HCT) 40-12.5 MG tablet, Take 1 tablet by mouth daily., Disp: 90 tablet, Rfl: 1   omeprazole (PRILOSEC) 20 MG capsule, Take 1 capsule (20 mg total) by mouth 2 (two) times daily before a meal., Disp: 180 capsule, Rfl: 1   ondansetron (ZOFRAN) 4 MG tablet, Take 1 tablet (4 mg total) by mouth every 8  (eight) hours as needed for nausea or vomiting., Disp: 20 tablet, Rfl: 0   Polyethyl Glycol-Propyl Glycol (SYSTANE OP), Place 1 drop into both eyes daily as needed (dry eyes)., Disp: , Rfl:    QUEtiapine (SEROQUEL) 25 MG tablet, Take 1 tablet (25 mg total) by mouth at bedtime., Disp: 90 tablet, Rfl: 1   rOPINIRole (REQUIP) 1 MG tablet, Take 1 tablet (1 mg total) by mouth at bedtime., Disp: 90 tablet, Rfl: 0   tiZANidine (ZANAFLEX) 4 MG tablet, Take 1 tablet (4 mg total) by mouth at bedtime as needed for muscle spasms., Disp: 90 tablet, Rfl: 1   hyoscyamine (LEVSIN/SL) 0.125 MG SL tablet, Place 1 tablet (0.125 mg total) under the tongue every 4 (four) hours as needed., Disp: 40 tablet, Rfl: 0  Allergies  Allergen Reactions   Vascepa [Icosapent Ethyl (Epa Ethyl Ester) (Fish)] Hives and Swelling   Aspir-81 [Aspirin] Other (See Comments)    Coagulation disorder, causes bruising    Gabapentin Other (See Comments)    Cognitive/memory issues   Metformin And Related     indigestion   Mounjaro [Tirzepatide] Diarrhea    Epigastric pain    Peanut-Containing Drug Products     Tongue goes numb from peanuts   Statins Other (See Comments)    Joint pain   Topamax [Topiramate] Other (See Comments)    Cognitive issues    Voltaren [Diclofenac Sodium] Hives and Other (See Comments)   Lipitor [Atorvastatin]     Joint pain    I personally reviewed active problem list, medication list, allergies, family history with the patient/caregiver today.   ROS  Ten systems reviewed and is negative except as mentioned in HPI    Objective Physical Exam CONSTITUTIONAL: Patient appears well-developed and well-nourished. No distress. HEENT: Head atraumatic, normocephalic, neck supple. CARDIOVASCULAR: Normal rate, regular rhythm and normal heart sounds. No murmur heard. No BLE edema. PULMONARY: Effort normal and breath sounds normal. Lungs clear to auscultation. No respiratory distress. ABDOMINAL: There is no  tenderness or distention. MUSCULOSKELETAL: uses a walker to assist with gait,  PSYCHIATRIC: Patient has a normal mood and affect. Behavior is normal. Judgment and thought content normal.  Vitals:   06/29/23 1117  BP: 120/74  Pulse: 92  Resp: 16  SpO2: 97%  Weight: 271  lb 12.8 oz (123.3 kg)  Height: 5\' 6"  (1.676 m)    Body mass index is 43.87 kg/m.    Diabetic Foot Exam:     PHQ2/9:    06/16/2023   10:18 AM 05/09/2023    8:47 AM 03/31/2023    2:52 PM 01/04/2023   10:16 AM 10/08/2022    8:34 AM  Depression screen PHQ 2/9  Decreased Interest 0 1 0 1 0  Down, Depressed, Hopeless 0 1 0 1 0  PHQ - 2 Score 0 2 0 2 0  Altered sleeping 0 1 0 2 0  Tired, decreased energy 0 1 0 2 0  Change in appetite 0 0 0 1 0  Feeling bad or failure about yourself  0 0 0 0 0  Trouble concentrating 0 1 0 0 0  Moving slowly or fidgety/restless 0 0 0 0 0  Suicidal thoughts 0 0 0 0 0  PHQ-9 Score 0 5 0 7 0  Difficult doing work/chores Not difficult at all Somewhat difficult Not difficult at all Somewhat difficult Not difficult at all    phq 9 is negative  Fall Risk:    06/16/2023   10:12 AM 05/23/2023    9:53 AM 05/09/2023    8:46 AM 03/31/2023    2:52 PM 01/04/2023   10:10 AM  Fall Risk   Falls in the past year? 1 0 1 0 0  Number falls in past yr: 0 0 0 0   Injury with Fall? 0 0 0 0   Risk for fall due to : Impaired balance/gait  Impaired balance/gait No Fall Risks Impaired balance/gait  Follow up Falls evaluation completed  Falls prevention discussed;Education provided;Falls evaluation completed Falls prevention discussed;Education provided;Falls evaluation completed Falls prevention discussed;Education provided;Falls evaluation completed     Assessment & Plan Restless Leg Syndrome Symptoms worsened post-cholecystectomy. Anemia and low ferritin suspected contributors. Gabapentin not tolerated. - Order ferritin level and CBC. - Increase ropinirole dosage temporarily. - Evaluate iron  levels, consider supplementation if ferritin low.  Surgical Site Infection Non-healing area persists due to moisture. - Advise on wound care to reduce moisture and promote healing.  Post-Cholecystectomy Status Occasional stomach discomfort post-eating. Advised dietary changes to manage symptoms. - Advise to limit greasy foods to prevent digestive issues.

## 2023-06-30 ENCOUNTER — Encounter: Payer: Self-pay | Admitting: Family Medicine

## 2023-06-30 LAB — CBC WITH DIFFERENTIAL/PLATELET
Absolute Lymphocytes: 466 {cells}/uL — ABNORMAL LOW (ref 850–3900)
Absolute Monocytes: 157 {cells}/uL — ABNORMAL LOW (ref 200–950)
Basophils Absolute: 29 {cells}/uL (ref 0–200)
Basophils Relative: 0.6 %
Eosinophils Absolute: 88 {cells}/uL (ref 15–500)
Eosinophils Relative: 1.8 %
HCT: 36.1 % (ref 35.0–45.0)
Hemoglobin: 11.8 g/dL (ref 11.7–15.5)
MCH: 31.1 pg (ref 27.0–33.0)
MCHC: 32.7 g/dL (ref 32.0–36.0)
MCV: 95 fL (ref 80.0–100.0)
MPV: 10.3 fL (ref 7.5–12.5)
Monocytes Relative: 3.2 %
Neutro Abs: 4160 {cells}/uL (ref 1500–7800)
Neutrophils Relative %: 84.9 %
Platelets: 295 10*3/uL (ref 140–400)
RBC: 3.8 10*6/uL (ref 3.80–5.10)
RDW: 13.4 % (ref 11.0–15.0)
Total Lymphocyte: 9.5 %
WBC: 4.9 10*3/uL (ref 3.8–10.8)

## 2023-06-30 LAB — IRON,TIBC AND FERRITIN PANEL
%SAT: 36 % (ref 16–45)
Ferritin: 378 ng/mL — ABNORMAL HIGH (ref 16–288)
Iron: 102 ug/dL (ref 45–160)
TIBC: 285 ug/dL (ref 250–450)

## 2023-07-03 ENCOUNTER — Other Ambulatory Visit: Payer: Self-pay | Admitting: Family Medicine

## 2023-07-03 DIAGNOSIS — R252 Cramp and spasm: Secondary | ICD-10-CM

## 2023-07-11 ENCOUNTER — Encounter: Payer: Self-pay | Admitting: Family Medicine

## 2023-07-22 ENCOUNTER — Telehealth (INDEPENDENT_AMBULATORY_CARE_PROVIDER_SITE_OTHER): Payer: Self-pay

## 2023-07-22 NOTE — Telephone Encounter (Signed)
 Sarah Gordon called wanting to cancel her sclero appointment for 07/29/23. She is unsure when she would like to reschedule.

## 2023-07-29 ENCOUNTER — Ambulatory Visit (INDEPENDENT_AMBULATORY_CARE_PROVIDER_SITE_OTHER): Admitting: Nurse Practitioner

## 2023-08-03 ENCOUNTER — Other Ambulatory Visit: Payer: Self-pay | Admitting: Family Medicine

## 2023-08-03 DIAGNOSIS — G2581 Restless legs syndrome: Secondary | ICD-10-CM

## 2023-08-09 ENCOUNTER — Encounter (INDEPENDENT_AMBULATORY_CARE_PROVIDER_SITE_OTHER): Payer: Self-pay

## 2023-08-16 ENCOUNTER — Encounter: Payer: Self-pay | Admitting: Family Medicine

## 2023-08-16 ENCOUNTER — Other Ambulatory Visit: Payer: Self-pay | Admitting: Family Medicine

## 2023-08-16 MED ORDER — HYDROXYZINE HCL 10 MG PO TABS: 10.0000 mg | ORAL_TABLET | Freq: Three times a day (TID) | ORAL | 0 refills | Status: AC | PRN

## 2023-08-20 ENCOUNTER — Other Ambulatory Visit: Payer: Self-pay | Admitting: Family Medicine

## 2023-08-20 DIAGNOSIS — E1169 Type 2 diabetes mellitus with other specified complication: Secondary | ICD-10-CM

## 2023-08-22 ENCOUNTER — Telehealth: Payer: Self-pay | Admitting: Family Medicine

## 2023-08-22 DIAGNOSIS — H2511 Age-related nuclear cataract, right eye: Secondary | ICD-10-CM | POA: Diagnosis not present

## 2023-08-22 DIAGNOSIS — M3501 Sicca syndrome with keratoconjunctivitis: Secondary | ICD-10-CM | POA: Diagnosis not present

## 2023-08-22 DIAGNOSIS — Z0100 Encounter for examination of eyes and vision without abnormal findings: Secondary | ICD-10-CM | POA: Diagnosis not present

## 2023-08-22 DIAGNOSIS — E1169 Type 2 diabetes mellitus with other specified complication: Secondary | ICD-10-CM

## 2023-08-22 DIAGNOSIS — E113211 Type 2 diabetes mellitus with mild nonproliferative diabetic retinopathy with macular edema, right eye: Secondary | ICD-10-CM | POA: Diagnosis not present

## 2023-08-22 MED ORDER — EZETIMIBE 10 MG PO TABS
10.0000 mg | ORAL_TABLET | Freq: Every day | ORAL | 0 refills | Status: DC
Start: 2023-08-22 — End: 2023-11-28

## 2023-08-22 NOTE — Telephone Encounter (Signed)
 refilled

## 2023-08-22 NOTE — Telephone Encounter (Signed)
 ezetimibe (ZETIA) 10 MG tablet

## 2023-08-29 ENCOUNTER — Ambulatory Visit (INDEPENDENT_AMBULATORY_CARE_PROVIDER_SITE_OTHER): Admitting: Nurse Practitioner

## 2023-09-04 ENCOUNTER — Emergency Department

## 2023-09-04 ENCOUNTER — Other Ambulatory Visit: Payer: Self-pay

## 2023-09-04 ENCOUNTER — Observation Stay
Admission: EM | Admit: 2023-09-04 | Discharge: 2023-09-06 | Disposition: A | Discharge status: Home / Self Care | Attending: Family Medicine | Admitting: Family Medicine

## 2023-09-04 DIAGNOSIS — I959 Hypotension, unspecified: Secondary | ICD-10-CM | POA: Diagnosis not present

## 2023-09-04 DIAGNOSIS — N179 Acute kidney failure, unspecified: Principal | ICD-10-CM

## 2023-09-04 DIAGNOSIS — I129 Hypertensive chronic kidney disease with stage 1 through stage 4 chronic kidney disease, or unspecified chronic kidney disease: Secondary | ICD-10-CM | POA: Diagnosis not present

## 2023-09-04 DIAGNOSIS — Z6841 Body Mass Index (BMI) 40.0 and over, adult: Secondary | ICD-10-CM | POA: Insufficient documentation

## 2023-09-04 DIAGNOSIS — N1832 Chronic kidney disease, stage 3b: Secondary | ICD-10-CM | POA: Insufficient documentation

## 2023-09-04 DIAGNOSIS — J45909 Unspecified asthma, uncomplicated: Secondary | ICD-10-CM | POA: Diagnosis not present

## 2023-09-04 DIAGNOSIS — D649 Anemia, unspecified: Secondary | ICD-10-CM | POA: Diagnosis not present

## 2023-09-04 DIAGNOSIS — I6523 Occlusion and stenosis of bilateral carotid arteries: Secondary | ICD-10-CM | POA: Diagnosis not present

## 2023-09-04 DIAGNOSIS — E039 Hypothyroidism, unspecified: Secondary | ICD-10-CM | POA: Diagnosis not present

## 2023-09-04 DIAGNOSIS — F32A Depression, unspecified: Secondary | ICD-10-CM | POA: Diagnosis not present

## 2023-09-04 DIAGNOSIS — E785 Hyperlipidemia, unspecified: Secondary | ICD-10-CM | POA: Insufficient documentation

## 2023-09-04 DIAGNOSIS — E1122 Type 2 diabetes mellitus with diabetic chronic kidney disease: Secondary | ICD-10-CM | POA: Insufficient documentation

## 2023-09-04 DIAGNOSIS — Z91010 Allergy to peanuts: Secondary | ICD-10-CM | POA: Insufficient documentation

## 2023-09-04 DIAGNOSIS — R079 Chest pain, unspecified: Secondary | ICD-10-CM | POA: Diagnosis not present

## 2023-09-04 DIAGNOSIS — E669 Obesity, unspecified: Secondary | ICD-10-CM | POA: Diagnosis not present

## 2023-09-04 DIAGNOSIS — F419 Anxiety disorder, unspecified: Secondary | ICD-10-CM | POA: Diagnosis not present

## 2023-09-04 DIAGNOSIS — R55 Syncope and collapse: Secondary | ICD-10-CM | POA: Insufficient documentation

## 2023-09-04 DIAGNOSIS — R464 Slowness and poor responsiveness: Secondary | ICD-10-CM | POA: Diagnosis present

## 2023-09-04 DIAGNOSIS — R519 Headache, unspecified: Secondary | ICD-10-CM | POA: Diagnosis not present

## 2023-09-04 LAB — CBC
HCT: 34.3 % — ABNORMAL LOW (ref 36.0–46.0)
Hemoglobin: 11.5 g/dL — ABNORMAL LOW (ref 12.0–15.0)
MCH: 31.4 pg (ref 26.0–34.0)
MCHC: 33.5 g/dL (ref 30.0–36.0)
MCV: 93.7 fL (ref 80.0–100.0)
Platelets: 262 10*3/uL (ref 150–400)
RBC: 3.66 MIL/uL — ABNORMAL LOW (ref 3.87–5.11)
RDW: 13.2 % (ref 11.5–15.5)
WBC: 5.1 10*3/uL (ref 4.0–10.5)
nRBC: 0 % (ref 0.0–0.2)

## 2023-09-04 LAB — COMPREHENSIVE METABOLIC PANEL WITH GFR
ALT: 25 U/L (ref 0–44)
AST: 32 U/L (ref 15–41)
Albumin: 3.5 g/dL (ref 3.5–5.0)
Alkaline Phosphatase: 85 U/L (ref 38–126)
Anion gap: 7 (ref 5–15)
BUN: 56 mg/dL — ABNORMAL HIGH (ref 8–23)
CO2: 22 mmol/L (ref 22–32)
Calcium: 8.4 mg/dL — ABNORMAL LOW (ref 8.9–10.3)
Chloride: 105 mmol/L (ref 98–111)
Creatinine, Ser: 2.28 mg/dL — ABNORMAL HIGH (ref 0.44–1.00)
GFR, Estimated: 23 mL/min — ABNORMAL LOW (ref >60)
Glucose, Bld: 179 mg/dL — ABNORMAL HIGH (ref 70–99)
Potassium: 4.8 mmol/L (ref 3.5–5.1)
Sodium: 134 mmol/L — ABNORMAL LOW (ref 135–145)
Total Bilirubin: 0.7 mg/dL (ref 0.0–1.2)
Total Protein: 6.6 g/dL (ref 6.5–8.1)

## 2023-09-04 LAB — URINALYSIS, ROUTINE W REFLEX MICROSCOPIC
Bilirubin Urine: NEGATIVE
Glucose, UA: NEGATIVE mg/dL
Hgb urine dipstick: NEGATIVE
Ketones, ur: NEGATIVE mg/dL
Leukocytes,Ua: NEGATIVE
Nitrite: NEGATIVE
Protein, ur: NEGATIVE mg/dL
Specific Gravity, Urine: 1.011 (ref 1.005–1.030)
pH: 5 (ref 5.0–8.0)

## 2023-09-04 LAB — URIC ACID: Uric Acid, Serum: 14.6 mg/dL — ABNORMAL HIGH (ref 2.5–7.1)

## 2023-09-04 LAB — TROPONIN I (HIGH SENSITIVITY)
Troponin I (High Sensitivity): 5 ng/L (ref <18)
Troponin I (High Sensitivity): 5 ng/L (ref <18)

## 2023-09-04 LAB — CK: Total CK: 165 U/L (ref 38–234)

## 2023-09-04 LAB — TSH: TSH: 5 u[IU]/mL — ABNORMAL HIGH (ref 0.350–4.500)

## 2023-09-04 LAB — CORTISOL: Cortisol, Plasma: 4.1 ug/dL

## 2023-09-04 MED ORDER — HYDRALAZINE HCL 20 MG/ML IJ SOLN: 5.0000 mg | Freq: Four times a day (QID) | INTRAMUSCULAR | Status: DC | PRN

## 2023-09-04 MED ORDER — HEPARIN SODIUM (PORCINE) 5000 UNIT/ML IJ SOLN
5000.0000 [IU] | Freq: Two times a day (BID) | INTRAMUSCULAR | Status: DC
  Administered 2023-09-04 – 2023-09-06 (×4): 5000 [IU] via SUBCUTANEOUS
  Filled 2023-09-04 (×4): qty 1

## 2023-09-04 MED ORDER — METOPROLOL SUCCINATE ER 25 MG PO TB24
25.0000 mg | ORAL_TABLET | Freq: Every day | ORAL | Status: DC
  Administered 2023-09-05 – 2023-09-06 (×2): 25 mg via ORAL
  Filled 2023-09-04 (×2): qty 1

## 2023-09-04 MED ORDER — QUETIAPINE FUMARATE 25 MG PO TABS
25.0000 mg | ORAL_TABLET | Freq: Every day | ORAL | Status: DC
  Administered 2023-09-04 – 2023-09-05 (×2): 25 mg via ORAL
  Filled 2023-09-04 (×2): qty 1

## 2023-09-04 MED ORDER — SODIUM CHLORIDE 0.9 % IV SOLN: INTRAVENOUS | Status: AC

## 2023-09-04 MED ORDER — SODIUM CHLORIDE 0.9 % IV BOLUS
1000.0000 mL | Freq: Once | INTRAVENOUS | Status: AC
  Administered 2023-09-04: 1000 mL via INTRAVENOUS

## 2023-09-04 MED ORDER — BUPROPION HCL ER (XL) 150 MG PO TB24
150.0000 mg | ORAL_TABLET | Freq: Every morning | ORAL | Status: DC
  Administered 2023-09-05 – 2023-09-06 (×2): 150 mg via ORAL
  Filled 2023-09-04 (×2): qty 1

## 2023-09-04 MED ORDER — EZETIMIBE 10 MG PO TABS
10.0000 mg | ORAL_TABLET | Freq: Every day | ORAL | Status: DC
  Administered 2023-09-05 – 2023-09-06 (×2): 10 mg via ORAL
  Filled 2023-09-04 (×2): qty 1

## 2023-09-04 MED ORDER — PANTOPRAZOLE SODIUM 40 MG PO TBEC
40.0000 mg | DELAYED_RELEASE_TABLET | Freq: Every day | ORAL | Status: DC
  Administered 2023-09-05 – 2023-09-06 (×2): 40 mg via ORAL
  Filled 2023-09-04 (×2): qty 1

## 2023-09-04 MED ORDER — ROPINIROLE HCL 1 MG PO TABS
2.0000 mg | ORAL_TABLET | Freq: Every day | ORAL | Status: DC
  Administered 2023-09-04 – 2023-09-05 (×2): 2 mg via ORAL
  Filled 2023-09-04 (×2): qty 2

## 2023-09-04 MED ORDER — TIZANIDINE HCL 4 MG PO TABS
4.0000 mg | ORAL_TABLET | Freq: Three times a day (TID) | ORAL | Status: DC | PRN
  Administered 2023-09-04: 4 mg via ORAL
  Filled 2023-09-04 (×2): qty 1

## 2023-09-04 MED ORDER — HYDROXYZINE HCL 10 MG PO TABS: 10.0000 mg | ORAL_TABLET | Freq: Three times a day (TID) | ORAL | Status: DC | PRN

## 2023-09-04 MED ORDER — LEVOTHYROXINE SODIUM 50 MCG PO TABS
100.0000 ug | ORAL_TABLET | Freq: Every day | ORAL | Status: DC
  Administered 2023-09-05 – 2023-09-06 (×2): 100 ug via ORAL
  Filled 2023-09-04 (×2): qty 2

## 2023-09-04 MED ORDER — ALBUTEROL SULFATE (2.5 MG/3ML) 0.083% IN NEBU: 3.0000 mL | INHALATION_SOLUTION | Freq: Four times a day (QID) | RESPIRATORY_TRACT | Status: DC | PRN

## 2023-09-04 MED ORDER — MONTELUKAST SODIUM 10 MG PO TABS
10.0000 mg | ORAL_TABLET | Freq: Every day | ORAL | Status: DC
  Administered 2023-09-04 – 2023-09-05 (×2): 10 mg via ORAL
  Filled 2023-09-04 (×2): qty 1

## 2023-09-04 MED ORDER — ONDANSETRON HCL 4 MG PO TABS: 4.0000 mg | ORAL_TABLET | Freq: Three times a day (TID) | ORAL | Status: DC | PRN

## 2023-09-04 MED ORDER — DULOXETINE HCL 60 MG PO CPEP
60.0000 mg | ORAL_CAPSULE | Freq: Every day | ORAL | Status: DC
  Administered 2023-09-05 – 2023-09-06 (×2): 60 mg via ORAL
  Filled 2023-09-04: qty 2
  Filled 2023-09-04 (×2): qty 1
  Filled 2023-09-04: qty 2

## 2023-09-04 NOTE — H&P (Signed)
 History and Physical    Sarah Gordon WGN:[id number] DOB: [birth year] DOA: 09/04/2023  PCP: Sowles, [name], MD (Confirm with patient/family/NH records and if not entered, this has to be entered at [facility] point of entry) Patient coming from: Home  I have personally briefly reviewed patient's old medical records in [facility]  Chief Complaint: Lightheaded, unsteady gait, confusion.  HPI: Sarah Gordon is a 73 y.o. female with medical history significant of HTN, HLD, CKD stage IIIb, morbid obesity, hypothyroidism, anxiety/depression, chronic back pain, presented with multiple complaints including lightheadedness, confusion and unsteady gait.  Patient had a severe gastroenteritis earlier this week, symptoms including repeated nauseous vomiting and loose diarrhea and intermittent epigastric pain for 3 days with episode of chills but no fever.  Symptoms resolved 3 days ago however, patient has had decreased appetite and decreased p.o. intake since.  Meantime, she continues to take her medication including BP meds, and she reported that her urinary output has been remained the same and urine color has been light-colored.  This morning, she started to feel lightheadedness blurry vision when standing up and was noticed to have unsteady gait and brief confusion.  She had to hold something to avoid fall.  But denied any numbness weakness of any limbs no double vision no headache.  ED Course: Afebrile, blood pressure 80/70 O2 saturation 98% on room air.  CT head negative for acute findings , Blood work showed AKI on CKD BUN 56 creatinine 2.2 compared to baseline 1.3-1.4.  Patient was given IV bolus and started on maintenance IV fluid. Review of Systems: As per HPI otherwise 14 point review of systems negative.    Past Medical History:  Diagnosis Date   Allergy    Anxiety '95   Asthma    Cataract    Depression '95   Esophageal reflux    Gouty arthropathy, unspecified    Herpes simplex without  mention of complication    Irritable bowel syndrome    Localized osteoarthrosis not specified whether primary or secondary, unspecified site    Migraine, unspecified, without mention of intractable migraine without mention of status migrainosus    Mild cognitive impairment    Mixed hyperlipidemia    Myalgia and myositis, unspecified    Obstructive sleep apnea (adult) (pediatric)    Palpitations    Sleep apnea    No CPAP   Type II or unspecified type diabetes mellitus without mention of complication, not stated as uncontrolled    Diet controlled   Umbilical hernia without mention of obstruction or gangrene    Unspecified essential hypertension    Unspecified hypothyroidism     Past Surgical History:  Procedure Laterality Date   BREAST BIOPSY Left    benign   CATARACT EXTRACTION W/PHACO Left 01/18/2023   Procedure: CATARACT EXTRACTION PHACO AND INTRAOCULAR LENS PLACEMENT (IOC) LEFT DIABETIC  CLAREON VIVITY TORIC LENS;  Surgeon: Clair Crews, MD;  Location: [facility];  Service: Ophthalmology;  Laterality: Left;  4.47 0:31.9   CATARACT EXTRACTION W/PHACO Right 02/01/2023   Procedure: CATARACT EXTRACTION PHACO AND INTRAOCULAR LENS PLACEMENT (IOC) RIGHT DIABETIC  CLAREON VIVITY TORIC LENS 3.68 00:28.9;  Surgeon: Clair Crews, MD;  Location: [facility];  Service: Ophthalmology;  Laterality: Right;   CHOLECYSTECTOMY     COLONOSCOPY     Dr Harvest Lineman   COLONOSCOPY WITH PROPOFOL  N/A 11/19/2015   Procedure: COLONOSCOPY WITH PROPOFOL ;  Surgeon: Jerlean Mood, MD;  Location: [facility];  Service: Endoscopy;  Laterality: N/A;  COLONOSCOPY WITH PROPOFOL  N/A 06/18/2021   Procedure: COLONOSCOPY WITH PROPOFOL ;  Surgeon: Selena Daily, MD;  Location: [facility];  Service: Gastroenterology;  Laterality: N/A;   ESOPHAGOGASTRODUODENOSCOPY N/A 08/20/2014   Procedure: ESOPHAGOGASTRODUODENOSCOPY (EGD);  Surgeon: Jerlean Mood, MD;  Location: [facility]  ENDOSCOPY;  Service: Endoscopy;  Laterality: N/A;   ESOPHAGOGASTRODUODENOSCOPY (EGD) WITH PROPOFOL  N/A 06/18/2021   Procedure: ESOPHAGOGASTRODUODENOSCOPY (EGD) WITH PROPOFOL ;  Surgeon: Selena Daily, MD;  Location: [facility];  Service: Gastroenterology;  Laterality: N/A;   FOOT SURGERY Left    heel spur   HERNIA REPAIR     umbilical   NASAL SINUS SURGERY  1980s   UMBILICAL HERNIA REPAIR       reports that she has never smoked. She has never been exposed to tobacco smoke. She has never used smokeless tobacco. She reports that she does not drink alcohol and does not use drugs.  Allergies  Allergen Reactions   Vascepa [Icosapent Ethyl (Epa Ethyl Ester) (Fish)] Hives and Swelling   Aspir-81 [Aspirin] Other (See Comments)    Coagulation disorder, causes bruising    Gabapentin  Other (See Comments)    Cognitive/memory issues   Metformin And Related     indigestion   Mounjaro  [Tirzepatide ] Diarrhea    Epigastric pain    Peanut-Containing Drug Products     Tongue goes numb from peanuts   Statins Other (See Comments)    Joint pain   Topamax [Topiramate] Other (See Comments)    Cognitive issues    Voltaren [Diclofenac Sodium] Hives and Other (See Comments)   Lipitor [Atorvastatin]     Joint pain    Family History  Problem Relation Age of Onset   Hypertension Mother    Hyperlipidemia Mother    Diabetes Mother    Heart disease Mother    Stroke Mother    Varicose Veins Mother    Alcohol abuse Father    Lupus Sister    Arthritis Sister    Depression Sister    Obesity Sister    Asthma Sister    Depression Sister    Hypertension Sister    Miscarriages / Stillbirths Sister    Varicose Veins Sister    Depression Sister    Arthritis Sister    Heart disease Sister    Hyperlipidemia Sister    Hypertension Sister    Miscarriages / [place] Sister    Varicose Veins Sister    Miscarriages / Stillbirths Sister    Arthritis Sister    Depression Sister    Heart  disease Sister    Hyperlipidemia Sister    Hypertension Sister    Heart disease Brother 31       CABG x 3    Heart attack Brother 10   Hyperlipidemia Brother    Hypertension Brother    Diabetes Brother    Hypertension Brother    Obesity Brother    Asthma Brother    Depression Brother    Hyperlipidemia Brother    Alcohol abuse Brother    Heart disease Brother    Stroke Brother    Varicose Veins Brother    Early death Brother    Stroke Brother    Arthritis Brother    Depression Brother    Hyperlipidemia Brother    Hypertension Brother    Stroke Maternal Grandfather    Breast cancer Neg Hx     Prior to Admission medications   Medication Sig Start Date End Date Taking? Authorizing Provider  acetaminophen  (TYLENOL ) 500 MG tablet  Take 1,000 mg by mouth every 6 (six) hours as needed for moderate pain (pain score 4-6).    [provider]  albuterol  (VENTOLIN  HFA) 108 (90 Base) MCG/ACT inhaler Inhale 2 puffs into the lungs every 6 (six) hours as needed for wheezing or shortness of breath. 05/23/20   Sowles, [name], MD  buPROPion  (WELLBUTRIN  XL) 150 MG 24 hr tablet Take 1 tablet (150 mg total) by mouth every morning. 05/09/23   Sowles, [name], MD  DULoxetine  (CYMBALTA ) 60 MG capsule Take 1 capsule (60 mg total) by mouth daily. 05/09/23   Sowles, [name], MD  Evolocumab  (REPATHA ) 140 MG/ML SOSY Inject 140 mg into the skin every 14 (fourteen) days. 09/01/22   Sowles, [name], MD  ezetimibe  (ZETIA ) 10 MG tablet Take 1 tablet (10 mg total) by mouth daily. 08/22/23   Sowles, [name], MD  folic acid  (FOLVITE ) 800 MCG tablet Take 800 mcg by mouth daily.    [provider]  hydrOXYzine  (ATARAX ) 10 MG tablet Take 1 tablet (10 mg total) by mouth 3 (three) times daily as needed for anxiety. 08/16/23   Sowles, [name], MD  levothyroxine  (SYNTHROID ) 100 MCG tablet TAKE 1 TABLET BY MOUTH DAILY 05/09/23   Sowles, [name], MD  metoprolol  succinate (TOPROL -XL) 25 MG 24 hr tablet Take 1 tablet  (25 mg total) by mouth daily. 05/09/23   Sowles, [name], MD  montelukast  (SINGULAIR ) 10 MG tablet Take 1 tablet (10 mg total) by mouth at bedtime. 05/09/23   Sowles, [name], MD  Multiple Vitamin (MULTIVITAMIN) tablet Take 1 tablet by mouth daily.    [provider]  olmesartan -hydrochlorothiazide  (BENICAR  HCT) 40-12.5 MG tablet Take 1 tablet by mouth daily. 05/09/23   Sowles, [name], MD  omeprazole  (PRILOSEC) 20 MG capsule Take 1 capsule (20 mg total) by mouth 2 (two) times daily before a meal. 01/04/23   Ava Lei, Arvis Laura, MD  ondansetron  (ZOFRAN ) 4 MG tablet Take 1 tablet (4 mg total) by mouth every 8 (eight) hours as needed for nausea or vomiting. 05/19/23   Sowles, [name], MD  Polyethyl Glycol-Propyl Glycol (SYSTANE OP) Place 1 drop into both eyes daily as needed (dry eyes).    [provider]  QUEtiapine  (SEROQUEL ) 25 MG tablet Take 1 tablet (25 mg total) by mouth at bedtime. 05/09/23   Sowles, [name], MD  rOPINIRole  (REQUIP ) 2 MG tablet Take 1 tablet (2 mg total) by mouth at bedtime. 06/29/23   Sowles, [name], MD  tiZANidine  (ZANAFLEX ) 4 MG tablet TAKE 1 TABLET BY MOUTH EVERY NIGHT AT BEDTIME AS NEEDED FOR MUSCLE SPASMS 07/04/23   Sowles, [name], MD  Fluticasone -Salmeterol (ADVAIR DISKUS) 250-50 MCG/DOSE AEPB Inhale 1 puff into the lungs 2 (two) times daily. 04/11/19 07/22/19  Arleen Lacer, MD    Physical Exam: Vitals:   09/04/23 0849 09/04/23 1039 09/04/23 1100 09/04/23 1130  BP:  (!) 100/57 (!) 88/59 (!) 85/72  Pulse:  86 76 62  Resp:  16 16 16   Temp:      TempSrc:      SpO2:  98% 98% 99%  Weight: 124.7 kg     Height: 5' 5 (1.651 m)       Constitutional: NAD, calm, comfortable Vitals:   09/04/23 0849 09/04/23 1039 09/04/23 1100 09/04/23 1130  BP:  (!) 100/57 (!) 88/59 (!) 85/72  Pulse:  86 76 62  Resp:  16 16 16   Temp:      TempSrc:      SpO2:  98% 98% 99%  Weight: 124.7 kg  Height: 5' 5 (1.651 m)      Eyes: PERRL, lids and conjunctivae normal ENMT:  Mucous membranes are dry. Posterior pharynx clear of any exudate or lesions.Normal dentition.  Neck: normal, supple, no masses, no thyromegaly Respiratory: clear to auscultation bilaterally, no wheezing, no crackles. Normal respiratory effort. No accessory muscle use.  Cardiovascular: Regular rate and rhythm, no murmurs / rubs / gallops. No extremity edema. 2+ pedal pulses. No carotid bruits.  Abdomen: no tenderness, no masses palpated. No hepatosplenomegaly. Bowel sounds positive.  Musculoskeletal: no clubbing / cyanosis. No joint deformity upper and lower extremities. Good ROM, no contractures. Normal muscle tone.  Skin: no rashes, lesions, ulcers. No induration Neurologic: CN 2-12 grossly intact. Sensation intact, DTR normal. Strength 5/5 in all 4.  Psychiatric: Normal judgment and insight. Alert and oriented x 3. Normal mood.     Labs on Admission: I have personally reviewed following labs and imaging studies  CBC: Recent Labs  Lab 09/04/23 0851  WBC 5.1  HGB 11.5*  HCT 34.3*  MCV 93.7  PLT 262   Basic Metabolic Panel: Recent Labs  Lab 09/04/23 0851  NA 134*  K 4.8  CL 105  CO2 22  GLUCOSE 179*  BUN 56*  CREATININE 2.28*  CALCIUM  8.4*   GFR: Estimated Creatinine Clearance: 30.5 mL/min (A) (by C-G formula based on SCr of 2.28 mg/dL (H)). Liver Function Tests: Recent Labs  Lab 09/04/23 0851  AST 32  ALT 25  ALKPHOS 85  BILITOT 0.7  PROT 6.6  ALBUMIN  3.5   No results for input(s): LIPASE, AMYLASE in the last 168 hours. No results for input(s): AMMONIA in the last 168 hours. Coagulation Profile: No results for input(s): INR, PROTIME in the last 168 hours. Cardiac Enzymes: No results for input(s): CKTOTAL, CKMB, CKMBINDEX, TROPONINI in the last 168 hours. BNP (last 3 results) No results for input(s): PROBNP in the last 8760 hours. HbA1C: No results for input(s): HGBA1C in the last 72 hours. CBG: No results for input(s): GLUCAP in the  last 168 hours. Lipid Profile: No results for input(s): CHOL, HDL, LDLCALC, TRIG, CHOLHDL, LDLDIRECT in the last 72 hours. Thyroid  Function Tests: No results for input(s): TSH, T4TOTAL, FREET4, T3FREE, THYROIDAB in the last 72 hours. Anemia Panel: No results for input(s): VITAMINB12, FOLATE, FERRITIN, TIBC, IRON, RETICCTPCT in the last 72 hours. Urine analysis:    Component Value Date/Time   COLORURINE YELLOW (A) 05/17/2023 1119   APPEARANCEUR CLEAR (A) 05/17/2023 1119   LABSPEC 1.033 (H) 05/17/2023 1119   PHURINE 5.0 05/17/2023 1119   GLUCOSEU NEGATIVE 05/17/2023 1119   HGBUR NEGATIVE 05/17/2023 1119   BILIRUBINUR NEGATIVE 05/17/2023 1119   KETONESUR NEGATIVE 05/17/2023 1119   PROTEINUR NEGATIVE 05/17/2023 1119   NITRITE NEGATIVE 05/17/2023 1119   LEUKOCYTESUR NEGATIVE 05/17/2023 1119    Radiological Exams on Admission: CT HEAD WO CONTRAST ( ) Result Date: 09/04/2023 EXAM: CT HEAD WITHOUT CONTRAST 09/04/2023 09:31:00 AM TECHNIQUE: CT of the head was performed without the administration of intravenous contrast. Automated exposure control, iterative reconstruction, and/or weight based adjustment of the mA/kV was utilized to reduce the radiation dose to as low as reasonably achievable. COMPARISON: CT head without contrast 02/20/2022. MR head without contrast 02/20/2022. CLINICAL HISTORY: Headache, new onset (Age >= 51y). Pt states that when they woke up this morning they were fine, but after around 0700 they became sluggish, they are having trouble finding their words, and when they turn their head it takes a minute for their vision to catch  up. Pt states that they feel off balance which is not their normal. Pt is able to ambulate but taking slow steps in triage. FINDINGS: BRAIN AND VENTRICLES: There is no acute intracranial hemorrhage, mass effect or midline shift. No abnormal extra-axial fluid collection. The gray-white differentiation is maintained  without an acute infarct. There is no hydrocephalus. No hyperdense vessel is present. ORBITS: Bilateral lens replacements are noted. The globes and orbits are otherwise within normal limits. SINUSES: Chronic fluid or soft tissue is present in the lateral left ethmoid air cells. SOFT TISSUES AND SKULL: No acute abnormality of the visualized skull or soft tissues. VASCULATURE: Mild atherosclerotic changes are present within the cavernous internal carotid arteries. IMPRESSION: 1. No acute intracranial abnormality. Electronically signed by: Audree Leas MD 09/04/2023 09:49 AM EDT RP Workstation: [id number]   DG Chest 2 View Result Date: 09/04/2023 CLINICAL DATA:  CP EXAM: CHEST - 2 VIEW COMPARISON:  February 20, 2022 FINDINGS: The cardiomediastinal silhouette is unchanged in contour.Atherosclerotic calcifications. No pleural effusion. No pneumothorax. No acute pleuroparenchymal abnormality. Visualized abdomen is unremarkable. Multilevel degenerative changes of the thoracic spine. IMPRESSION: No acute cardiopulmonary abnormality. Electronically Signed   By: Clancy Crimes M.D.   On: 09/04/2023 09:31    EKG: Independently reviewed.  Sinus rhythm, no acute ST changes.  Assessment/Plan Principal Problem:   AKI (acute kidney injury) (HCC) Active Problems:   Near syncope  (please populate well all problems here in Problem List. (For example, if patient is on BP meds at home and you resume or decide to hold them, it is a problem that needs to be her. Same for CAD, COPD, HLD and so on)  AKI on CKD stage IIIb - Prerenal, likely secondary to poor oral intake and GI loss from earlier gastroenteritis process few days ago.  Clinically still appears to be volume contracted, will give 1 more dose of IV bolus and started on maintenance IV fluid - Check CK and uric acid level  Near syncope Hypotension - Secondary to volume contraction and ongoing use of BP meds including diuresis - Hold off all home BP  meds - Orthostatic vital signs -TSH and cortisol level  HLD - Outpatient Repatha  injection  Anxiety/depression - Mentation at baseline - Continue SSRI  DVT prophylaxis: Heparin subcu Code Status: Full code Family Communication: Daughter at bedside Disposition Plan: Expect less than 2 midnight hospitalization Consults called: None Admission status: Telemetry observation   [place] MD [facility] Pager [phone]  09/04/2023, 12:20 PM

## 2023-09-04 NOTE — ED Provider Notes (Signed)
 [facility] Provider Note    Event Date/Time   First MD Initiated Contact with Patient 09/04/23 [phone]     (approximate)   History   sluggish   HPI  Sarah SMET is a 73 y.o. female with history of cholecystectomy on 05/24/2023, diabetes, depression who comes in with concerns for feeling sluggish.  Patient reports that she went to bed around 11 PM and felt her normal self.  She reports when she first woke up this morning she did feel little sluggish but she noticed that throughout the day it was getting worse.  She reports just feeling really tired.  She reports having to sit down while she was at church due to this fatigue.  She states that she sees okay out of her eyes is just taking her some time to process what she is seeing but denies any blurred vision.  She reported to triage some sluggishness with walking as well.   Physical Exam   Triage Vital Signs: ED Triage Vitals  Encounter Vitals Group     BP 09/04/23 0847 (!) 93/56     Girls Systolic BP Percentile --      Girls Diastolic BP Percentile --      Boys Systolic BP Percentile --      Boys Diastolic BP Percentile --      Pulse Rate 09/04/23 0847 99     Resp 09/04/23 0847 16     Temp 09/04/23 0847 98.6 F (37 C)     Temp Source 09/04/23 0847 Oral     SpO2 09/04/23 0847 99 %     Weight 09/04/23 0849 275 lb (124.7 kg)     Height 09/04/23 0849 5' 5 (1.651 m)     Head Circumference --      Peak Flow --      Pain Score 09/04/23 0848 0     Pain Loc --      Pain Education --      Exclude from Growth Chart --     Most recent vital signs: Vitals:   09/04/23 0847  BP: (!) 93/56  Pulse: 99  Resp: 16  Temp: 98.6 F (37 C)  SpO2: 99%     General: Awake, no distress.  CV:  Good peripheral perfusion.  Resp:  Normal effort.  Abd:  No distention.  Soft and nontender Other:  Cranial nerves appear intact.  Equal strength in arms and legs   ED Results / Procedures / Treatments   Labs (all  labs ordered are listed, but only abnormal results are displayed) Labs Reviewed  COMPREHENSIVE METABOLIC PANEL WITH GFR - Abnormal; Notable for the following components:      Result Value   Sodium 134 (*)    Glucose, Bld 179 (*)    BUN 56 (*)    Creatinine, Ser 2.28 (*)    Calcium  8.4 (*)    GFR, Estimated 23 (*)    All other components within normal limits  CBC - Abnormal; Notable for the following components:   RBC 3.66 (*)    Hemoglobin 11.5 (*)    HCT 34.3 (*)    All other components within normal limits  URINALYSIS, ROUTINE W REFLEX MICROSCOPIC  CBG MONITORING, ED  TROPONIN I (HIGH SENSITIVITY)     EKG  My interpretation of EKG:  Normal sinus rate of 98 without any ST elevation or T wave inversions, normal intervals  RADIOLOGY I have reviewed the xray personally and interpreted no  evidence of any edema   PROCEDURES:  Critical Care performed: No  .1-3 Lead EKG Interpretation  Performed by: Lubertha Rush, MD Authorized by: Lubertha Rush, MD     Interpretation: normal     ECG rate:  90   ECG rate assessment: normal     Rhythm: sinus rhythm     Ectopy: none     Conduction: normal      MEDICATIONS ORDERED IN ED: Medications  sodium chloride  0.9 % bolus 1,000 mL (1,000 mLs Intravenous New Bag/Given 09/04/23 0946)     IMPRESSION / MDM / ASSESSMENT AND PLAN / ED COURSE  I reviewed the triage vital signs and the nursing notes.   Patient's presentation is most consistent with acute presentation with potential threat to life or bodily function.   Patient comes in with some fatigue and weakness.  Workup was done evaluate for Electra abnormalities, AKI, ACS, UTI no obvious neurodeficits on my examination out of the window for stroke code for TNK.  No evidence of LVO based upon examination however will get CT head to rule out any type of intracranial hemorrhage.  Patient initially hypotensive so I wonder if some of this could be for some dehydration.   Troponin  was negative, CBC shows hemoglobin around baseline.  CMP does show new AKI at 2.28  Repeat evaluation patient continues to have soft blood pressures.  She is on multiple blood pressure medications including metoprolol , olmesartan , hydrochlorothiazide  so I suspect some of this is related to the medicine that she took this morning.  Will give an additional liter of fluid given AKI and continued low blood pressures will discuss the hospital team for admission and patient felt more comfortable with that plan.  Patient does report that a few days ago she had some nausea vomiting diarrhea so I do suspect that this could be because of patient's AKI and the blood pressure medications could be causing her to have lower blood pressure   The patient is on the cardiac monitor to evaluate for evidence of arrhythmia and/or significant heart rate changes.      FINAL CLINICAL IMPRESSION(S) / ED DIAGNOSES   Final diagnoses:  AKI (acute kidney injury) (HCC)  Hypotension, unspecified hypotension type     Rx / DC Orders   ED Discharge Orders     None        Note:  This document was prepared using Dragon voice recognition software and may include unintentional dictation errors.   Lubertha Rush, MD 09/04/23 1201

## 2023-09-04 NOTE — ED Notes (Signed)
 Pt BP 80s/50s. MD aware and at bedside. Pt is alert and oriented, stating she feels tired but otherwise ok. Pt has received 1L fluids; does not yet have the urge to urinate.

## 2023-09-04 NOTE — ED Triage Notes (Signed)
 Pt states that when they woke up this morning they were fine, but after around 0700 they became sluggish, they are having trouble finding their words, and when they turn their head it takes a minute for their vision to catch up. Pt states that they feel off balance which is not their normal. Pt is able to ambulate but taking slow steps in triage.

## 2023-09-05 DIAGNOSIS — N179 Acute kidney failure, unspecified: Secondary | ICD-10-CM | POA: Diagnosis not present

## 2023-09-05 LAB — BASIC METABOLIC PANEL WITH GFR
Anion gap: 8 (ref 5–15)
BUN: 46 mg/dL — ABNORMAL HIGH (ref 8–23)
CO2: 19 mmol/L — ABNORMAL LOW (ref 22–32)
Calcium: 8 mg/dL — ABNORMAL LOW (ref 8.9–10.3)
Chloride: 113 mmol/L — ABNORMAL HIGH (ref 98–111)
Creatinine, Ser: 1.65 mg/dL — ABNORMAL HIGH (ref 0.44–1.00)
GFR, Estimated: 33 mL/min — ABNORMAL LOW (ref >60)
Glucose, Bld: 128 mg/dL — ABNORMAL HIGH (ref 70–99)
Potassium: 4.1 mmol/L (ref 3.5–5.1)
Sodium: 140 mmol/L (ref 135–145)

## 2023-09-05 LAB — CBC
HCT: 29.7 % — ABNORMAL LOW (ref 36.0–46.0)
Hemoglobin: 9.7 g/dL — ABNORMAL LOW (ref 12.0–15.0)
MCH: 30.7 pg (ref 26.0–34.0)
MCHC: 32.7 g/dL (ref 30.0–36.0)
MCV: 94 fL (ref 80.0–100.0)
Platelets: 218 10*3/uL (ref 150–400)
RBC: 3.16 MIL/uL — ABNORMAL LOW (ref 3.87–5.11)
RDW: 13.2 % (ref 11.5–15.5)
WBC: 4.5 10*3/uL (ref 4.0–10.5)
nRBC: 0 % (ref 0.0–0.2)

## 2023-09-05 NOTE — Plan of Care (Signed)
   Problem: Education: Goal: Knowledge of General Education information will improve Description: Including pain rating scale, medication(s)/side effects and non-pharmacologic comfort measures Outcome: Progressing   Problem: Health Behavior/Discharge Planning: Goal: Ability to manage health-related needs will improve Outcome: Progressing   Problem: Safety: Goal: Ability to remain free from injury will improve Outcome: Progressing

## 2023-09-05 NOTE — Progress Notes (Signed)
 PROGRESS NOTE    Sarah Gordon  QMV:[id number] DOB: 12-21-1950 DOA: 09/04/2023 PCP: Sowles, [name], MD  Chief Complaint  Patient presents with   sluggish    Hospital Course:  Sarah Gordon is a 73 year old female with hypertension, hyperlipidemia, CKD stage IIIb, morbid obesity, hypothyroidism, anxiety, depression, chronic back pain, who presented with lightheadedness, confusion and unsteady gait.  Patient reports she had gastroenteritis earlier this week with nausea, vomiting, loose stools.  She reports no further symptoms but she has had decreased appetite and decreased p.o. intake since.  On arrival blood pressure was 80/70, and blood work revealed AKI with a creatinine of 2.2 compared to her baseline of 1.3.  She was admitted for IV fluids  Subjective: This morning patient reports she is feeling much better.  No acute events overnight.  No acute complaints at this time.  She is currently tolerating her breakfast without issue   Objective: Vitals:   09/04/23 1439 09/04/23 2105 09/05/23 0400 09/05/23 1127  BP: (!) 116/57 (!) 116/59 (!) 157/104 123/69  Pulse: 63 69 68 72  Resp: 17   16  Temp: 98.4 F (36.9 C) 97.8 F (36.6 C) 97.8 F (36.6 C) 98.1 F (36.7 C)  TempSrc: Oral  Oral   SpO2: 100% 100% 95% 100%  Weight:      Height:        Intake/Output Summary (Last 24 hours) at 09/05/2023 1358 Last data filed at 09/05/2023 2841 Gross per 24 hour  Intake 2135.18 ml  Output --  Net 2135.18 ml   Filed Weights   09/04/23 0849  Weight: 124.7 kg    Examination: General exam: Appears calm and comfortable, NAD  Respiratory system: No work of breathing, symmetric chest wall expansion Cardiovascular system: S1 & S2 heard, RRR.  Gastrointestinal system: Abdomen is nondistended, soft and nontender.  Neuro: Alert and oriented. No focal neurological deficits. Extremities: Symmetric, expected ROM Skin: No rashes, lesions Psychiatry: Demonstrates appropriate judgement and insight.  Mood & affect appropriate for situation.   Assessment & Plan:  Principal Problem:   AKI (acute kidney injury) (HCC) Active Problems:   Near syncope   AKI on CKD stage IIIb - Prerenal secondary to GI losses from gastroenteritis - Gastroenteritis has resolved - Status post IV fluids, creatinine resolving now - Baseline creatinine 1.35, elevated to 2.28 on arrival.  Down to 1.65 this morning.  Expect will continue to downtrend. - Encourage p.o. intake  Near-syncope Hypotension - Secondary to dehydration as above - All symptoms resolved now - Orthostatic vitals: Negative - PT evals ordered  Hypothyroidism - TSH elevated above goal.  Will likely require increase in levothyroxine .  Will avoid acute changes given acute hospitalization and AKI on arrival - Recommend repeat TSH next week with PCP and further medication titration based on those results  Hyperlipidemia - Resume home meds at discharge  Anxiety Depression - Continue home dose meds  Hypertension - Blood pressure low on arrival, holding antihypertensives.  Resume as needed  Morbid obesity BMI 45 - Outpatient follow up for lifestyle modification and risk factor management  Normocytic anemia, chronic - Hemoglobin 11.5 on arrival, down to 9.7 today which I suspect is to her baseline now that patient has been appropriately volume resuscitated.  No acute bleeding - Outpatient follow-up   DVT prophylaxis: Heparin   Code Status: Full Code Disposition:  Likely DC home tomorrow if creatinine continues to improve  Consultants:    Procedures:    Antimicrobials:  Anti-infectives (From admission, onward)  None       Data Reviewed: I have personally reviewed following labs and imaging studies CBC: Recent Labs  Lab 09/04/23 0851 09/05/23 0241  WBC 5.1 4.5  HGB 11.5* 9.7*  HCT 34.3* 29.7*  MCV 93.7 94.0  PLT 262 218   Basic Metabolic Panel: Recent Labs  Lab 09/04/23 0851 09/05/23 0241  NA 134* 140   K 4.8 4.1  CL 105 113*  CO2 22 19*  GLUCOSE 179* 128*  BUN 56* 46*  CREATININE 2.28* 1.65*  CALCIUM  8.4* 8.0*   GFR: Estimated Creatinine Clearance: 42.1 mL/min (A) (by C-G formula based on SCr of 1.65 mg/dL (H)). Liver Function Tests: Recent Labs  Lab 09/04/23 0851  AST 32  ALT 25  ALKPHOS 85  BILITOT 0.7  PROT 6.6  ALBUMIN  3.5   CBG: No results for input(s): GLUCAP in the last 168 hours.  No results found for this or any previous visit (from the past 240 hours).   Radiology Studies: CT HEAD WO CONTRAST ( ) Result Date: 09/04/2023 EXAM: CT HEAD WITHOUT CONTRAST 09/04/2023 09:31:00 AM TECHNIQUE: CT of the head was performed without the administration of intravenous contrast. Automated exposure control, iterative reconstruction, and/or weight based adjustment of the mA/kV was utilized to reduce the radiation dose to as low as reasonably achievable. COMPARISON: CT head without contrast 02/20/2022. MR head without contrast 02/20/2022. CLINICAL HISTORY: Headache, new onset (Age >= 51y). Pt states that when they woke up this morning they were fine, but after around 0700 they became sluggish, they are having trouble finding their words, and when they turn their head it takes a minute for their vision to catch up. Pt states that they feel off balance which is not their normal. Pt is able to ambulate but taking slow steps in triage. FINDINGS: BRAIN AND VENTRICLES: There is no acute intracranial hemorrhage, mass effect or midline shift. No abnormal extra-axial fluid collection. The gray-white differentiation is maintained without an acute infarct. There is no hydrocephalus. No hyperdense vessel is present. ORBITS: Bilateral lens replacements are noted. The globes and orbits are otherwise within normal limits. SINUSES: Chronic fluid or soft tissue is present in the lateral left ethmoid air cells. SOFT TISSUES AND SKULL: No acute abnormality of the visualized skull or soft tissues.  VASCULATURE: Mild atherosclerotic changes are present within the cavernous internal carotid arteries. IMPRESSION: 1. No acute intracranial abnormality. Electronically signed by: Audree Leas MD 09/04/2023 09:49 AM EDT RP Workstation: [id number]   DG Chest 2 View Result Date: 09/04/2023 CLINICAL DATA:  CP EXAM: CHEST - 2 VIEW COMPARISON:  February 20, 2022 FINDINGS: The cardiomediastinal silhouette is unchanged in contour.Atherosclerotic calcifications. No pleural effusion. No pneumothorax. No acute pleuroparenchymal abnormality. Visualized abdomen is unremarkable. Multilevel degenerative changes of the thoracic spine. IMPRESSION: No acute cardiopulmonary abnormality. Electronically Signed   By: Clancy Crimes M.D.   On: 09/04/2023 09:31    Scheduled Meds:  buPROPion   150 mg Oral q morning   DULoxetine   60 mg Oral Daily   ezetimibe   10 mg Oral Daily   heparin  5,000 Units Subcutaneous Q12H   levothyroxine   100 mcg Oral Q0600   metoprolol  succinate  25 mg Oral Daily   montelukast   10 mg Oral QHS   pantoprazole   40 mg Oral Daily   QUEtiapine   25 mg Oral QHS   rOPINIRole   2 mg Oral QHS   Continuous Infusions:   LOS: 0 days  MDM: Patient is high risk for one or more organ  failure.  They necessitate ongoing hospitalization for continued IV therapies and subsequent lab monitoring. Total time spent interpreting labs and vitals, reviewing the medical record, coordinating care amongst consultants and care team members, directly assessing and discussing care with the patient and/or family: 55 min  Torey Reinard, DO [facility]  To contact the attending physician between 7A-7P please use Epic Chat. To contact the covering physician during after hours 7P-7A, please review Amion.  09/05/2023, 1:58 PM   *This document has been created with the assistance of dictation software. Please excuse typographical errors. *

## 2023-09-05 NOTE — Care Management Obs Status (Signed)
 MEDICARE OBSERVATION STATUS NOTIFICATION   Patient Details  Name: Sarah Gordon MRN: [id number] Date of Birth: Aug 13, 1950   Medicare Observation Status Notification Given:  Rudolph Cost, CMA 09/05/2023, 11:48 AM

## 2023-09-05 NOTE — Progress Notes (Signed)
 Per Dr Marquette Sites, dc tele monitoring

## 2023-09-05 NOTE — Plan of Care (Signed)
 The patient continues on sodium chloride  0.9% IVF @ 123ml/hr. IV team came and put a new IV to patient's L hand. The IV is flushing w/o difficulty or s/s of infiltrate.

## 2023-09-06 ENCOUNTER — Ambulatory Visit: Payer: Medicare HMO | Admitting: Family Medicine

## 2023-09-06 DIAGNOSIS — R55 Syncope and collapse: Secondary | ICD-10-CM | POA: Diagnosis not present

## 2023-09-06 DIAGNOSIS — N179 Acute kidney failure, unspecified: Secondary | ICD-10-CM | POA: Diagnosis not present

## 2023-09-06 LAB — COMPREHENSIVE METABOLIC PANEL WITH GFR
ALT: 320 U/L — ABNORMAL HIGH (ref 0–44)
AST: 511 U/L — ABNORMAL HIGH (ref 15–41)
Albumin: 3 g/dL — ABNORMAL LOW (ref 3.5–5.0)
Alkaline Phosphatase: 163 U/L — ABNORMAL HIGH (ref 38–126)
Anion gap: 7 (ref 5–15)
BUN: 30 mg/dL — ABNORMAL HIGH (ref 8–23)
CO2: 23 mmol/L (ref 22–32)
Calcium: 8.6 mg/dL — ABNORMAL LOW (ref 8.9–10.3)
Chloride: 110 mmol/L (ref 98–111)
Creatinine, Ser: 1.32 mg/dL — ABNORMAL HIGH (ref 0.44–1.00)
GFR, Estimated: 43 mL/min — ABNORMAL LOW (ref >60)
Glucose, Bld: 118 mg/dL — ABNORMAL HIGH (ref 70–99)
Potassium: 4.8 mmol/L (ref 3.5–5.1)
Sodium: 140 mmol/L (ref 135–145)
Total Bilirubin: 0.7 mg/dL (ref 0.0–1.2)
Total Protein: 5.8 g/dL — ABNORMAL LOW (ref 6.5–8.1)

## 2023-09-06 MED ORDER — ACETAMINOPHEN 325 MG PO TABS
650.0000 mg | ORAL_TABLET | Freq: Four times a day (QID) | ORAL | Status: DC | PRN
  Administered 2023-09-06: 650 mg via ORAL
  Filled 2023-09-06: qty 2

## 2023-09-06 NOTE — Evaluation (Signed)
 Physical Therapy Evaluation Patient Details Name: Sarah Gordon MRN: [id number] DOB: [birth year] Today's Date: 09/06/2023  History of Present Illness  Pt is a 73 y.o. female with medical history significant of HTN, HLD, CKD stage IIIb, morbid obesity, hypothyroidism, anxiety/depression, and chronic back pain who presented to the ED with multiple complaints including lightheadedness, confusion and unsteady gait. MD diagnoses includes AKI and near syncope.  Clinical Impression  Pt was pleasant and motivated to participate during the session and put forth good effort throughout. Pt was Mod I with ambulation with use of rollator and remained steady throughout. Pt was independent with ascending/descending 4 steps without the use of a handrail with no LOBs occurring. Pt reported no adverse symptoms during the session with SpO2 and HR WNL throughout on room air. The pt stated after today's session that she feels that she is at her baseline level of function. Will complete PT orders at this time but will reassess pt pending a change in status upon receipt of new PT orders.         If plan is discharge home, recommend the following: Assist for transportation   Can travel by private vehicle        Equipment Recommendations None recommended by PT  Recommendations for Other Services       Functional Status Assessment Patient has not had a recent decline in their functional status     Precautions / Restrictions Precautions Precautions: None Restrictions Weight Bearing Restrictions Per Provider Order: No      Mobility  Bed Mobility               General bed mobility comments: Not tested; pt received sitting EOB    Transfers Overall transfer level: Modified independent Equipment used: Rollator (4 wheels)               General transfer comment: Pt completed STS with rollator and no physical assistance. No LOBs or unsteadiness occured    Ambulation/Gait Ambulation/Gait  assistance: Modified independent (Device/Increase time) Gait Distance (Feet): 225 Feet Assistive device: Rollator (4 wheels) Gait Pattern/deviations: WFL(Within Functional Limits)       General Gait Details: Pt demonstrated upright posture with the use of rollator and no gait deviations during ambulation. Pt remained steady throughout and no safety concerns with use of rollator.  Stairs Stairs: Yes Stairs assistance: Independent Stair Management: No rails Number of Stairs: 4 General stair comments: Pt independent with ascending/descending 4 steps with no handrail. No unsteadiness or LOBs occured.  Wheelchair Mobility     Tilt Bed    Modified Rankin (Stroke Patients Only)       Balance Overall balance assessment: Independent                                           Pertinent Vitals/Pain Pain Assessment Pain Assessment: No/denies pain    Home Living Family/patient expects to be discharged to:: Private residence Living Arrangements: Spouse/significant other;Children Available Help at Discharge: Family;Available 24 hours/day Type of Home: House Home Access: Stairs to enter Entrance Stairs-Rails: None Entrance Stairs-Number of Steps: 1   Home Layout: One level Home Equipment: Rollator (4 wheels)      Prior Function Prior Level of Function : Independent/Modified Independent             Mobility Comments: Independent with mobility at home with no AD, independent with mobility in community  with rollator ADLs Comments: Independent with ADLs at home with no AD     Extremity/Trunk Assessment   Upper Extremity Assessment Upper Extremity Assessment: Overall WFL for tasks assessed    Lower Extremity Assessment Lower Extremity Assessment: Overall WFL for tasks assessed       Communication   Communication Communication: No apparent difficulties    Cognition Arousal: Alert Behavior During Therapy: WFL for tasks assessed/performed   PT -  Cognitive impairments: No apparent impairments                         Following commands: Intact       Cueing Cueing Techniques: Verbal cues     General Comments      Exercises     Assessment/Plan    PT Assessment Patient does not need any further PT services  PT Problem List         PT Treatment Interventions      PT Goals (Current goals can be found in the Care Plan section)  Acute Rehab PT Goals PT Goal Formulation: All assessment and education complete, DC therapy    Frequency       Co-evaluation               AM-PAC PT 6 Clicks Mobility  Outcome Measure Help needed turning from your back to your side while in a flat bed without using bedrails?: None Help needed moving from lying on your back to sitting on the side of a flat bed without using bedrails?: None Help needed moving to and from a bed to a chair (including a wheelchair)?: None Help needed standing up from a chair using your arms (e.g., wheelchair or bedside chair)?: None Help needed to walk in hospital room?: None Help needed climbing 3-5 steps with a railing? : None 6 Click Score: 24    End of Session Equipment Utilized During Treatment: Gait belt Activity Tolerance: Patient tolerated treatment well Patient left: in bed;with call bell/phone within reach Nurse Communication: Mobility status      Time: 0981-1914 PT Time Calculation (min) (ACUTE ONLY): 15 min   Charges:                 Rhoda Centers, SPT 09/06/23, 1:22 PM

## 2023-09-06 NOTE — TOC Transition Note (Signed)
 Transition of Care [facility]) - Discharge Note   Patient Details  Name: Sarah Gordon MRN: [id number] Date of Birth: [birth year]  Transition of Care [facility]) CM/SW Contact:  Crayton Docker, RN 09/06/2023, 5:20 PM   Clinical Narrative:      Discharge orders noted. Patient discharged. No identified needs. Private transportation arranged.    Patient Goals and CMS Choice    Home/self care  Discharge Placement     Home/self care        Discharge Plan and Services Additional resources added to the After Visit Summary for    Social Drivers of Health (SDOH) Interventions SDOH Screenings   Food Insecurity: No Food Insecurity (09/04/2023)  Housing: Low Risk  (09/04/2023)  Transportation Needs: No Transportation Needs (09/04/2023)  Utilities: Not At Risk (09/04/2023)  Alcohol Screen: Low Risk  (06/16/2023)  Depression (PHQ2-9): Low Risk  (06/16/2023)  Recent Concern: Depression (PHQ2-9) - Medium Risk (05/09/2023)  Financial Resource Strain: Low Risk  (06/16/2023)  Physical Activity: Insufficiently Active (06/16/2023)  Social Connections: Unknown (09/04/2023)  Stress: No Stress Concern Present (06/16/2023)  Recent Concern: Stress - Stress Concern Present (05/08/2023)  Tobacco Use: Low Risk  (09/04/2023)     Readmission Risk Interventions     No data to display

## 2023-09-06 NOTE — Discharge Summary (Signed)
 Physician Discharge Summary   Patient: Sarah Gordon MRN: [id number] DOB: 1950/10/02  Admit date:     09/04/2023  Discharge date: 09/06/23  Discharge Physician: Roise Cleaver   PCP: Sowles, [name], MD   Recommendations at discharge:   Follow-up with primary care physician to recheck kidney function, monitor blood pressure, and recheck TSH which was elevated during this admission.  Discharge Diagnoses: Principal Problem:   AKI (acute kidney injury) (HCC) Active Problems:   Near syncope  Resolved Problems:   * No resolved hospital problems. *  Hospital Course: Sarah Gordon is a 73 year old female with hypertension, hyperlipidemia, CKD stage IIIb, morbid obesity, hypothyroidism, anxiety, depression, chronic back pain, who presented with lightheadedness, confusion and unsteady gait.  Patient reports she had gastroenteritis earlier this week with nausea, vomiting, loose stools.  She reports no further symptoms but she has had decreased appetite and decreased p.o. intake since.  On arrival blood pressure was 80/70, and blood work revealed AKI with a creatinine of 2.2 compared to her baseline of 1.3.  She was admitted for IV fluids.  Gradually her blood pressure and kidney function returned to normal.  By reevaluation on 6/17 patient was anxious for discharge home.  She reported feeling back to her physiologic baseline.  Had no further episodes of diarrhea, nausea, vomiting while admitted.  She was tolerating p.o, and demonstrated no skilled therapy needs.  AKI on CKD stage IIIb - Prerenal secondary to GI losses from gastroenteritis - Gastroenteritis has resolved - Baseline creatinine 1.35, elevated to 2.28 on arrival.  Gradually downtrended towards normal. - Is on ARB/HCTZ at home.  Have held for now.  Follow-up with PCP to discuss when to resume  Near-syncope Hypotension - Secondary to dehydration as above - All symptoms resolved now - Orthostatic vitals: Negative - Skilled PT  needs   Hypothyroidism - TSH elevated above goal.  Will likely require increase in levothyroxine .  Will avoid acute changes given acute hospitalization and AKI on arrival - Recommend repeat TSH next week with PCP and further medication titration based on those results   Hyperlipidemia - Resume home meds at discharge   Anxiety Depression - Continue home dose meds   Hypertension - Blood pressure low on arrival so all antihypertensives were held.  Blood pressure has now resolved but unlikely to tolerate Benicar .  I recommended patient keep blood pressure log and follow-up with PCP to evaluate ongoing needs for antihypertensives.   Morbid obesity BMI 45 - Outpatient follow up for lifestyle modification and risk factor management   Normocytic anemia, chronic - Hemoglobin 11.5 on arrival, down to 9.7 which I suspect is nearer to her baseline now that patient has been appropriately volume resuscitated.  No acute bleeding - Outpatient follow-up      Consultants: n/a Procedures performed: n/a  Disposition: Home Diet recommendation:  Discharge Diet Orders (From admission, onward)     Start     Ordered   09/06/23 0000  Diet general        09/06/23 1209           Discharge Instructions     Call MD for:  difficulty breathing, headache or visual disturbances   Complete by: As directed    Call MD for:  persistant dizziness or light-headedness   Complete by: As directed    Call MD for:  persistant nausea and vomiting   Complete by: As directed    Call MD for:  severe uncontrolled pain   Complete by: As  directed    Call MD for:  temperature >100.4   Complete by: As directed    Diet general   Complete by: As directed    Discharge instructions   Complete by: As directed    While you were admitted we held your blood pressure medication BENICAR  which can be hard on the kidneys.  Your kidneys have returned towards normal now but your blood pressure does not seem to require this  medication.  Please keep a blood pressure log and follow-up with your primary care physician to discuss if you need to continue taking this medication.  You may find that your blood pressure is higher at home than it was in the hospital. Please follow-up with your primary care physician to recheck your kidney function in 1 week and ensure that it has returned to normal.   Increase activity slowly   Complete by: As directed      '   DISCHARGE MEDICATION: Allergies as of 09/06/2023       Reactions   Vascepa [icosapent Ethyl (epa Ethyl Ester) (fish)] Hives, Swelling   Aspir-81 [aspirin] Other (See Comments)   Coagulation disorder, causes bruising    Gabapentin  Other (See Comments)   Cognitive/memory issues   Metformin And Related    indigestion   Mounjaro  [tirzepatide ] Diarrhea   Epigastric pain    Peanut-containing Drug Products    Tongue goes numb from peanuts   Statins Other (See Comments)   Joint pain   Topamax [topiramate] Other (See Comments)   Cognitive issues    Voltaren [diclofenac Sodium] Hives, Other (See Comments)   Lipitor [atorvastatin]    Joint pain        Medication List     PAUSE taking these medications    olmesartan -hydrochlorothiazide  40-12.5 MG tablet Wait to take this until your doctor or other care provider tells you to start again. Commonly known as: BENICAR  HCT Take 1 tablet by mouth daily.   Repatha  140 MG/ML Sosy Wait to take this until your doctor or other care provider tells you to start again. Generic drug: Evolocumab  Inject 140 mg into the skin every 14 (fourteen) days.       STOP taking these medications    albuterol  108 (90 Base) MCG/ACT inhaler Commonly known as: VENTOLIN  HFA       TAKE these medications    acetaminophen  500 MG tablet Commonly known as: TYLENOL  Take 1,000 mg by mouth every 6 (six) hours as needed for moderate pain (pain score 4-6).   buPROPion  150 MG 24 hr tablet Commonly known as: WELLBUTRIN  XL Take 1  tablet (150 mg total) by mouth every morning.   DULoxetine  60 MG capsule Commonly known as: CYMBALTA  Take 1 capsule (60 mg total) by mouth daily.   ezetimibe  10 MG tablet Commonly known as: ZETIA  Take 1 tablet (10 mg total) by mouth daily.   folic acid  800 MCG tablet Commonly known as: FOLVITE  Take 800 mcg by mouth daily.   hydrOXYzine  10 MG tablet Commonly known as: ATARAX  Take 1 tablet (10 mg total) by mouth 3 (three) times daily as needed for anxiety.   levothyroxine  100 MCG tablet Commonly known as: SYNTHROID  TAKE 1 TABLET BY MOUTH DAILY   metoprolol  succinate 25 MG 24 hr tablet Commonly known as: TOPROL -XL Take 1 tablet (25 mg total) by mouth daily.   montelukast  10 MG tablet Commonly known as: SINGULAIR  Take 1 tablet (10 mg total) by mouth at bedtime.   multivitamin tablet Take 1 tablet  by mouth daily.   omeprazole  20 MG capsule Commonly known as: PRILOSEC Take 1 capsule (20 mg total) by mouth 2 (two) times daily before a meal.   ondansetron  4 MG tablet Commonly known as: Zofran  Take 1 tablet (4 mg total) by mouth every 8 (eight) hours as needed for nausea or vomiting.   QUEtiapine  25 MG tablet Commonly known as: SEROQUEL  Take 1 tablet (25 mg total) by mouth at bedtime.   rOPINIRole  2 MG tablet Commonly known as: REQUIP  Take 1 tablet (2 mg total) by mouth at bedtime.   SYSTANE OP Place 1 drop into both eyes daily as needed (dry eyes).   tiZANidine  4 MG tablet Commonly known as: ZANAFLEX  TAKE 1 TABLET BY MOUTH EVERY NIGHT AT BEDTIME AS NEEDED FOR MUSCLE SPASMS        Follow-up Information     Sowles, [name], MD Follow up.   Specialty: Family Medicine Why: Hospital follow up Contact information: [address] [place] [address] [phone]                Discharge Exam: Cleavon Curls Weights   09/04/23 0849  Weight: 124.7 kg   Constitutional:  Normal appearance. Non toxic-appearing.  HENT: Head Normocephalic and  atraumatic.  Mucous membranes are moist.  Eyes:  Extraocular intact. Conjunctivae normal. Pupils are equal, round, and reactive to light.  Cardiovascular: Rate and Rhythm: Normal rate and regular rhythm.  Pulmonary: Non labored, symmetric rise of chest wall.  Musculoskeletal:  Normal range of motion.  Skin: warm and dry. not jaundiced.  Neurological: No focal deficit present. alert. Oriented. Psychiatric: Mood and Affect congruent.   Condition at discharge: Stable  The results of significant diagnostics from this hospitalization (including imaging, microbiology, ancillary and laboratory) are listed below for reference.   Imaging Studies: CT HEAD WO CONTRAST ( ) Result Date: 09/04/2023 EXAM: CT HEAD WITHOUT CONTRAST 09/04/2023 09:31:00 AM TECHNIQUE: CT of the head was performed without the administration of intravenous contrast. Automated exposure control, iterative reconstruction, and/or weight based adjustment of the mA/kV was utilized to reduce the radiation dose to as low as reasonably achievable. COMPARISON: CT head without contrast 02/20/2022. MR head without contrast 02/20/2022. CLINICAL HISTORY: Headache, new onset (Age >= 51y). Pt states that when they woke up this morning they were fine, but after around 0700 they became sluggish, they are having trouble finding their words, and when they turn their head it takes a minute for their vision to catch up. Pt states that they feel off balance which is not their normal. Pt is able to ambulate but taking slow steps in triage. FINDINGS: BRAIN AND VENTRICLES: There is no acute intracranial hemorrhage, mass effect or midline shift. No abnormal extra-axial fluid collection. The gray-white differentiation is maintained without an acute infarct. There is no hydrocephalus. No hyperdense vessel is present. ORBITS: Bilateral lens replacements are noted. The globes and orbits are otherwise within normal limits. SINUSES: Chronic fluid or soft tissue is  present in the lateral left ethmoid air cells. SOFT TISSUES AND SKULL: No acute abnormality of the visualized skull or soft tissues. VASCULATURE: Mild atherosclerotic changes are present within the cavernous internal carotid arteries. IMPRESSION: 1. No acute intracranial abnormality. Electronically signed by: Audree Leas MD 09/04/2023 09:49 AM EDT RP Workstation: [id number]   DG Chest 2 View Result Date: 09/04/2023 CLINICAL DATA:  CP EXAM: CHEST - 2 VIEW COMPARISON:  February 20, 2022 FINDINGS: The cardiomediastinal silhouette is unchanged in contour.Atherosclerotic calcifications. No pleural effusion. No pneumothorax.  No acute pleuroparenchymal abnormality. Visualized abdomen is unremarkable. Multilevel degenerative changes of the thoracic spine. IMPRESSION: No acute cardiopulmonary abnormality. Electronically Signed   By: Clancy Crimes M.D.   On: 09/04/2023 09:31    Microbiology: Results for orders placed or performed during the hospital encounter of 03/27/21  SARS CORONAVIRUS 2 (TAT 6-24 HRS) Nasopharyngeal Nasopharyngeal Swab     Status: Abnormal   Collection Time: 03/27/21 12:21 PM   Specimen: Nasopharyngeal Swab  Result Value Ref Range Status   SARS Coronavirus 2 POSITIVE (A) NEGATIVE Final    Comment: (NOTE) SARS-CoV-2 target nucleic acids are DETECTED.  The SARS-CoV-2 RNA is generally detectable in upper and lower respiratory specimens during the acute phase of infection. Positive results are indicative of the presence of SARS-CoV-2 RNA. Clinical correlation with patient history and other diagnostic information is  necessary to determine patient infection status. Positive results do not rule out bacterial infection or co-infection with other viruses.  The expected result is Negative.  Fact Sheet for Patients: HairSlick.no  Fact Sheet for Healthcare Providers: quierodirigir.com  This test is not yet approved or  cleared by the [place]  FDA and  has been authorized for detection and/or diagnosis of SARS-CoV-2 by FDA under an Emergency Use Authorization (EUA). This EUA will remain  in effect (meaning this test can be used) for the duration of the COVID-19 declaration under Section 564(b)(1) of the Act, 21 U. S.C. section 360bbb-3(b)(1), unless the authorization is terminated or revoked sooner.   Performed at [facility], [address], [place], [place] [address]     Labs: CBC: Recent Labs  Lab 09/04/23 0851 09/05/23 0241  WBC 5.1 4.5  HGB 11.5* 9.7*  HCT 34.3* 29.7*  MCV 93.7 94.0  PLT 262 218   Basic Metabolic Panel: Recent Labs  Lab 09/04/23 0851 09/05/23 0241 09/06/23 0824  NA 134* 140 140  K 4.8 4.1 4.8  CL 105 113* 110  CO2 22 19* 23  GLUCOSE 179* 128* 118*  BUN 56* 46* 30*  CREATININE 2.28* 1.65* 1.32*  CALCIUM  8.4* 8.0* 8.6*   Liver Function Tests: Recent Labs  Lab 09/04/23 0851 09/06/23 0824  AST 32 511*  ALT 25 320*  ALKPHOS 85 163*  BILITOT 0.7 0.7  PROT 6.6 5.8*  ALBUMIN  3.5 3.0*   CBG: No results for input(s): GLUCAP in the last 168 hours.  Discharge time spent: 32 minutes.  Signed: Aariona Momon, DO [facility] 09/06/2023

## 2023-09-06 NOTE — Plan of Care (Signed)
 The patient is alert and oriented x 4. Speech is clear, able to voice needs to staff. Ambulates with four wheel walker with assist x 1. Continent  of bowels and bladder. IV to L hand is intact. No s/sof acute distress noted.

## 2023-09-06 NOTE — Progress Notes (Signed)
 MD order received in [facility] to discharge pt home today; verbally reviewed AVS with pt, no new Rxs; no questions voiced at this time; volunteer called to [facility] pt to [facility]

## 2023-09-06 NOTE — Plan of Care (Signed)

## 2023-09-06 NOTE — Discharge Instructions (Signed)
 Your nurse navigator, Cleotilde Spadaccini, can be reached at [phone]

## 2023-09-07 ENCOUNTER — Ambulatory Visit: Admitting: Surgery

## 2023-09-08 ENCOUNTER — Ambulatory Visit: Payer: Self-pay

## 2023-09-08 ENCOUNTER — Emergency Department: Admission: EM | Admit: 2023-09-08 | Discharge: 2023-09-08 | Disposition: A | Discharge status: Home / Self Care

## 2023-09-08 ENCOUNTER — Emergency Department

## 2023-09-08 ENCOUNTER — Other Ambulatory Visit: Payer: Self-pay

## 2023-09-08 DIAGNOSIS — R0789 Other chest pain: Secondary | ICD-10-CM | POA: Diagnosis not present

## 2023-09-08 DIAGNOSIS — R079 Chest pain, unspecified: Secondary | ICD-10-CM | POA: Diagnosis not present

## 2023-09-08 DIAGNOSIS — E039 Hypothyroidism, unspecified: Secondary | ICD-10-CM | POA: Diagnosis not present

## 2023-09-08 DIAGNOSIS — R519 Headache, unspecified: Secondary | ICD-10-CM | POA: Insufficient documentation

## 2023-09-08 DIAGNOSIS — N189 Chronic kidney disease, unspecified: Secondary | ICD-10-CM | POA: Insufficient documentation

## 2023-09-08 LAB — CBC
HCT: 33.1 % — ABNORMAL LOW (ref 36.0–46.0)
Hemoglobin: 11.1 g/dL — ABNORMAL LOW (ref 12.0–15.0)
MCH: 31.7 pg (ref 26.0–34.0)
MCHC: 33.5 g/dL (ref 30.0–36.0)
MCV: 94.6 fL (ref 80.0–100.0)
Platelets: 259 10*3/uL (ref 150–400)
RBC: 3.5 MIL/uL — ABNORMAL LOW (ref 3.87–5.11)
RDW: 13.7 % (ref 11.5–15.5)
WBC: 5.3 10*3/uL (ref 4.0–10.5)
nRBC: 0 % (ref 0.0–0.2)

## 2023-09-08 LAB — BASIC METABOLIC PANEL WITH GFR
Anion gap: 9 (ref 5–15)
BUN: 23 mg/dL (ref 8–23)
CO2: 20 mmol/L — ABNORMAL LOW (ref 22–32)
Calcium: 8.8 mg/dL — ABNORMAL LOW (ref 8.9–10.3)
Chloride: 110 mmol/L (ref 98–111)
Creatinine, Ser: 1.28 mg/dL — ABNORMAL HIGH (ref 0.44–1.00)
GFR, Estimated: 45 mL/min — ABNORMAL LOW (ref >60)
Glucose, Bld: 95 mg/dL (ref 70–99)
Potassium: 4.4 mmol/L (ref 3.5–5.1)
Sodium: 139 mmol/L (ref 135–145)

## 2023-09-08 LAB — TROPONIN I (HIGH SENSITIVITY)
Troponin I (High Sensitivity): 6 ng/L (ref <18)
Troponin I (High Sensitivity): 6 ng/L (ref <18)

## 2023-09-08 NOTE — ED Provider Notes (Signed)
 [facility] Provider Note    Event Date/Time   First MD Initiated Contact with Patient 09/08/23 1647     (approximate)   History   Chest Pain and Headache  Pt states CP that started 1 hour ago, pt also endorses dehydration HA and was seen for this on Tuesday. NAD noted.  Pt endorses HTN.    HPI Sarah Gordon is a 73 y.o. female PMH hyperlipidemia, CKD, morbid obesity, hypothyroidism, anxiety, depression presents for evaluation of chest discomfort, headache - Patient has had intermittent headache over the past 2 days.  Was told after her recent discharge to call primary care provider she developed a headache, called primary care provider today and they could not see her in clinic so she came to ED for eval.  Headache is in the right forehead, no ocular pain, no vision changes.  No nausea or vomiting.  No preceding trauma.  No neck pain or stiffness.  No fevers. - Not on any blood thinners - Not sudden onset, not worst headache of life.  Max 5/10, has not taken any pain medications today.  Currently minimal, 2-3/10.  No focal weakness.  No eye pain. - Patient notes she did become somewhat anxious about her headache and started feeling some chest discomfort around 1 PM this afternoon shortly after eating lunch.  Was at rest when this occurred.  Is not having any exertional symptoms.  Described as substernal pressure.  Does use omeprazole  for GERD.  Per chart review, patient was last admitted 09/04/2023-09/06/2023 after presenting with lightheadedness, confusion, unsteady gait in the setting of recent nausea and vomiting and loose stools.  AKI with creatinine of 2.2 (baseline 1.3) as well as hypotension on arrival.  Improved with IV fluid.  Held ARB/hydrochlorothiazide  at time of discharge.  Per chart review, patient had a coronary CTA in March 2024: IMPRESSION: 1. Coronary calcium  score of 433. This was 92nd percentile for age and sex matched control. 2. Normal  coronary origin with right dominance. 3. Mild proximal LAD stenosis (25-49%). 4. Minimal proximal RCA stenosis (<25%) 5. CAD-RADS 2. Mild non-obstructive CAD (25-49%). Consider non-atherosclerotic causes of chest pain. Consider preventive therapy and risk factor modification.     Physical Exam   Triage Vital Signs: ED Triage Vitals  Encounter Vitals Group     BP 09/08/23 1450 (!) 145/73     Girls Systolic BP Percentile --      Girls Diastolic BP Percentile --      Boys Systolic BP Percentile --      Boys Diastolic BP Percentile --      Pulse Rate 09/08/23 1450 100     Resp 09/08/23 1450 19     Temp 09/08/23 1450 99.1 F (37.3 C)     Temp src --      SpO2 09/08/23 1450 99 %     Weight 09/08/23 1448 275 lb (124.7 kg)     Height 09/08/23 1448 5' 6 (1.676 m)     Head Circumference --      Peak Flow --      Pain Score 09/08/23 1447 5     Pain Loc --      Pain Education --      Exclude from Growth Chart --     Most recent vital signs: Vitals:   09/08/23 1847 09/08/23 1850  BP:  (!) 135/90  Pulse:  83  Resp:  20  Temp: 97.7 F (36.5 C)   SpO2:  98%     General: Awake, no distress.  HEENT:  Cephalic, atraumatic, no temporal tenderness.  PERRL, EOMI, no conjunctival injection. CV:  Good peripheral perfusion. RRR, RP 2+ Resp:  Normal effort. CTAB Abd:  No distention. Nontender to deep palpation throughout Neuro:  Aox4, CN II-XII intact, FNF wnl, finger taps fast b/l, 5/5 strength in bilateral finger extension/grip, arm flexion/extension, EHL/FHL. BUE AG 10+ sec no drift, BLE AG 5+ sec no drift. SILT.    ED Results / Procedures / Treatments   Labs (all labs ordered are listed, but only abnormal results are displayed) Labs Reviewed  BASIC METABOLIC PANEL WITH GFR - Abnormal; Notable for the following components:      Result Value   CO2 20 (*)    Creatinine, Ser 1.28 (*)    Calcium  8.8 (*)    GFR, Estimated 45 (*)    All other components within normal limits   CBC - Abnormal; Notable for the following components:   RBC 3.50 (*)    Hemoglobin 11.1 (*)    HCT 33.1 (*)    All other components within normal limits  TROPONIN I (HIGH SENSITIVITY)  TROPONIN I (HIGH SENSITIVITY)     EKG  Ecg = sinus rhythm, rate 74, no ST elevation or depression, no significant repolarization abnormality, normal axis, normal intervals.  No clear evidence of ischemia nor arrhythmia on my interpretation.   RADIOLOGY Radiology interpreted by myself and radiology report reviewed.  No acute pathology.    PROCEDURES:  Critical Care performed: No  Procedures   MEDICATIONS ORDERED IN ED: Medications - No data to display   IMPRESSION / MDM / ASSESSMENT AND PLAN / ED COURSE  I reviewed the triage vital signs and the nursing notes.                              DDX/MDM/AP: Differential diagnosis includes, but is not limited to, suspect benign headache type including migraine or tension headache.  No headache red flags to suggest SAH, other acute intracranial pathology.  No findings or history suggestive of temporal arteritis, glaucoma.  With regard to chest discomfort, consider GERD, ACS, anxiety contributing.    Plan: - Labs - EKG - Chest x-ray -No indication for head imaging at this time -Patient denies need for any pain medications at this time - Reassess  Patient's presentation is most consistent with acute presentation with potential threat to life or bodily function.  The patient is on the cardiac monitor to evaluate for evidence of arrhythmia and/or significant heart rate changes.  ED course below.  Workup unremarkable including serial troponins.  EKG nonischemic.  Given patient's age and comorbidities she does have an elevated heart score at baseline --offered inpatient stress testing versus outpatient PMD follow-up for consideration of stress testing, given unremarkable workup here and patient was primarily here for headache she does prefer  discharge home with PMD follow-up.  Symptoms did occur shortly after eating I suspect may actually be secondary to GERD, plan to continue omeprazole .  Recommend Tylenol  as needed for any recurrent headache.  ED return precautions in place.  Patient agrees with plan.  Clinical Course as of 09/08/23 1858  Thu Sep 08, 2023  1653 Trop wnl [MM]  1653 Bmp at baseline Cbc at baseline, stable anemia [MM]  1741 CXR: IMPRESSION: No acute abnormality.   [MM]  1807 Rpt trop stable [MM]    Clinical Course User Index [MM]  Collis Deaner, MD     FINAL CLINICAL IMPRESSION(S) / ED DIAGNOSES   Final diagnoses:  Nonintractable headache, unspecified chronicity pattern, unspecified headache type  Atypical chest pain     Rx / DC Orders   ED Discharge Orders     None        Note:  This document was prepared using Dragon voice recognition software and may include unintentional dictation errors.   Collis Deaner, MD 09/08/23 [phone]

## 2023-09-08 NOTE — Telephone Encounter (Signed)
  1st attempt, no answer. Left voicemail for patient to call back for nurse triage.          patient was seen Sunday at [facility] for dehydration - now experiencing headaches worsening.

## 2023-09-08 NOTE — ED Triage Notes (Signed)
 Pt states CP that started 1 hour ago, pt also endorses dehydration HA and was seen for this on Tuesday. NAD noted.  Pt endorses HTN.

## 2023-09-08 NOTE — Telephone Encounter (Signed)
 Pt notified- verbalized understanding.

## 2023-09-08 NOTE — Telephone Encounter (Signed)
 2nd attempt, no answer. Left voicemail for patient to return call to nurse triage.

## 2023-09-08 NOTE — Telephone Encounter (Signed)
   FYI Only or Action Required?: Action required by provider: clinical question for provider.  Patient was last seen in primary care on 06/29/2023 by Arleen Lacer, MD. Called Nurse Triage reporting Headache. Symptoms began Yesterday that went away and started this morning and has been constant all day. Interventions attempted: Rest, hydration, or home remedies. Symptoms are: gradually worsening.  Triage Disposition: See Physician Within 24 Hours, No Contact Calls  Patient/caregiver understands and will follow disposition?: Unsure                Reason for Disposition  [1] MODERATE headache (e.g., interferes with normal activities) AND [2] present > 24 hours AND [3] unexplained  (Exceptions: analgesics not tried, typical migraine, or headache part of viral illness)  Answer Assessment - Initial Assessment Questions 1. LOCATION: Where does it hurt?      Behind eyes, along temples 2. ONSET: When did the headache start? (Minutes, hours or days)      Yesterday and it went away, headache started this morning and has stayed all day 3. PATTERN: Does the pain come and go, or has it been constant since it started?     Comes and goes but has been constant since this morning 4. SEVERITY: How bad is the pain? and What does it keep you from doing?  (e.g., Scale 1-10; mild, moderate, or severe)   - MILD (1-3): doesn't interfere with normal activities    - MODERATE (4-7): interferes with normal activities or awakens from sleep    - SEVERE (8-10): excruciating pain, unable to do any normal activities        4 5. RECURRENT SYMPTOM: Have you ever had headaches before? If Yes, ask: When was the last time? and What happened that time?       Not one like this that comes and goes 6. CAUSE: What do you think is causing the headache?     N/A 7. MIGRAINE: Have you been diagnosed with migraine headaches? If Yes, ask: Is this headache similar?     Yes but no medications---patient  states she doesn't have them anymore 8. HEAD INJURY: Has there been any recent injury to the head?      No 9. OTHER SYMPTOMS: Do you have any other symptoms? (fever, stiff neck, eye pain, sore throat, cold symptoms)     Back of neck is stiff   Patient was in the hospital for 3 days for dehydration  Blood pressure was 135/67 this morning on 09/08/2023  Patient was advised to call her doctor if she experienced additional or worsening symptoms to include a headache. With this headache, patient states she feels very uneasy about it.   Given the patient's recent hospital admission, and being advised to call her Doctor if she developed a headache---This RN called the [facility] and advised them that this RN felt like the patient didn't need to wait until tomorrow for an appointment as per disposition stated.  They advised to send a CRM and they would let the provider know about the situation.  Protocols used: [facility]

## 2023-09-08 NOTE — Telephone Encounter (Signed)
 3rd and final attempt, no answer. Left voicemail for patient to return call to nurse triage. Reason for Disposition  Third attempt to contact caller AND no contact made. Phone number verified.  Protocols used: No Contact or Duplicate Contact Call-A-AH  patient was seen Sunday at [facility] for dehydration - now experiencing headaches worsening.

## 2023-09-08 NOTE — Telephone Encounter (Signed)
 Patient wants your advice

## 2023-09-08 NOTE — Discharge Instructions (Signed)
 Your evaluation in the emergency department was overall reassuring, and we saw no concerning findings today.  You can use Tylenol  as needed for any recurrent headaches.  Please do follow-up with your primary care provider regarding your headaches and your episode of chest discomfort today.  Continue to take your omeprazole  as already prescribed.  Return to the emergency department with any new or worsening symptoms.

## 2023-09-12 ENCOUNTER — Ambulatory Visit: Admitting: Surgery

## 2023-09-21 ENCOUNTER — Encounter: Payer: Self-pay | Admitting: Family Medicine

## 2023-09-21 ENCOUNTER — Other Ambulatory Visit: Payer: Self-pay | Admitting: Family Medicine

## 2023-09-21 ENCOUNTER — Other Ambulatory Visit: Payer: Self-pay

## 2023-09-21 DIAGNOSIS — K219 Gastro-esophageal reflux disease without esophagitis: Secondary | ICD-10-CM

## 2023-09-21 MED ORDER — OMEPRAZOLE 20 MG PO CPDR: 20.0000 mg | DELAYED_RELEASE_CAPSULE | Freq: Two times a day (BID) | ORAL | 0 refills | Status: DC

## 2023-09-26 ENCOUNTER — Ambulatory Visit (INDEPENDENT_AMBULATORY_CARE_PROVIDER_SITE_OTHER): Admitting: Surgery

## 2023-09-26 ENCOUNTER — Encounter: Payer: Self-pay | Admitting: Surgery

## 2023-09-26 VITALS — BP 147/82 | HR 78 | Ht 66.0 in | Wt 276.0 lb

## 2023-09-26 DIAGNOSIS — Z09 Encounter for follow-up examination after completed treatment for conditions other than malignant neoplasm: Secondary | ICD-10-CM

## 2023-09-26 DIAGNOSIS — K810 Acute cholecystitis: Secondary | ICD-10-CM | POA: Diagnosis not present

## 2023-09-26 NOTE — Patient Instructions (Signed)
   Follow-up with our office as needed.  Please call and ask to speak with a nurse if you develop questions or concerns.

## 2023-09-28 ENCOUNTER — Ambulatory Visit (INDEPENDENT_AMBULATORY_CARE_PROVIDER_SITE_OTHER): Admitting: Nurse Practitioner

## 2023-09-28 NOTE — Progress Notes (Signed)
 Outpatient Surgical Follow Up    Sarah Gordon is an 73 y.o. female.   Chief Complaint  Patient presents with   Routine Post Op    HPI: Sarah Gordon is 3 1/2 months out from robotic cholecystectomy. She has done really well. Developed some superficial dehiscence but has resolved. No fevers no chills. She has improved dramatically, she is very appreciative  Past Medical History:  Diagnosis Date   Allergy    Anxiety '95   Asthma    Cataract    Depression '95   Esophageal reflux    Gouty arthropathy, unspecified    Herpes simplex without mention of complication    Irritable bowel syndrome    Localized osteoarthrosis not specified whether primary or secondary, unspecified site    Migraine, unspecified, without mention of intractable migraine without mention of status migrainosus    Mild cognitive impairment    Mixed hyperlipidemia    Myalgia and myositis, unspecified    Obstructive sleep apnea (adult) (pediatric)    Palpitations    Sleep apnea    No CPAP   Type II or unspecified type diabetes mellitus without mention of complication, not stated as uncontrolled    Diet controlled   Umbilical hernia without mention of obstruction or gangrene    Unspecified essential hypertension    Unspecified hypothyroidism     Past Surgical History:  Procedure Laterality Date   BREAST BIOPSY Left    benign   CATARACT EXTRACTION W/PHACO Left 01/18/2023   Procedure: CATARACT EXTRACTION PHACO AND INTRAOCULAR LENS PLACEMENT (IOC) LEFT DIABETIC  CLAREON VIVITY TORIC LENS;  Surgeon: Jaye Fallow, MD;  Location: [facility];  Service: Ophthalmology;  Laterality: Left;  4.47 0:31.9   CATARACT EXTRACTION W/PHACO Right 02/01/2023   Procedure: CATARACT EXTRACTION PHACO AND INTRAOCULAR LENS PLACEMENT (IOC) RIGHT DIABETIC  CLAREON VIVITY TORIC LENS 3.68 00:28.9;  Surgeon: Jaye Fallow, MD;  Location: [facility];  Service: Ophthalmology;  Laterality: Right;   CHOLECYSTECTOMY      COLONOSCOPY     Dr Desiderio   COLONOSCOPY WITH PROPOFOL  N/A 11/19/2015   Procedure: COLONOSCOPY WITH PROPOFOL ;  Surgeon: Louanne KANDICE Muse, MD;  Location: [facility];  Service: Endoscopy;  Laterality: N/A;   COLONOSCOPY WITH PROPOFOL  N/A 06/18/2021   Procedure: COLONOSCOPY WITH PROPOFOL ;  Surgeon: Unk Corinn Skiff, MD;  Location: [facility] END[facility]COPY;  Service: Gastroenterology;  Laterality: N/A;   ESOPHAGOGASTRODUODENOSCOPY N/A 08/20/2014   Procedure: ESOPHAGOGASTRODUODENOSCOPY (EGD);  Surgeon: Louanne KANDICE Muse, MD;  Location: [facility]COPY;  Service: Endoscopy;  Laterality: N/A;   ESOPHAGOGASTRODUODENOSCOPY (EGD) WITH PROPOFOL  N/A 06/18/2021   Procedure: ESOPHAGOGASTRODUODENOSCOPY (EGD) WITH PROPOFOL ;  Surgeon: Unk Corinn Skiff, MD;  Location: [facility] ENDOSCOPY;  Service: Gastroenterology;  Laterality: N/A;   FOOT SURGERY Left    heel spur   HERNIA REPAIR     umbilical   NASAL SINUS SURGERY  1980s   UMBILICAL HERNIA REPAIR      Family History  Problem Relation Age of Onset   Hypertension Mother    Hyperlipidemia Mother    Diabetes Mother    Heart disease Mother    Stroke Mother    Varicose Veins Mother    Alcohol abuse Father    Lupus Sister    Arthritis Sister    Depression Sister    Obesity Sister    Asthma Sister    Depression Sister    Hypertension Sister    Miscarriages / Stillbirths Sister    Varicose Veins Sister    Depression Sister  Arthritis Sister    Heart disease Sister    Hyperlipidemia Sister    Hypertension Sister    Miscarriages / [place] Sister    Varicose Veins Sister    Miscarriages / Stillbirths Sister    Arthritis Sister    Depression Sister    Heart disease Sister    Hyperlipidemia Sister    Hypertension Sister    Heart disease Brother 30       CABG x 3    Heart attack Brother 33   Hyperlipidemia Brother    Hypertension Brother    Diabetes Brother    Hypertension Brother    Obesity Brother    Asthma Brother    Depression  Brother    Hyperlipidemia Brother    Alcohol abuse Brother    Heart disease Brother    Stroke Brother    Varicose Veins Brother    Early death Brother    Stroke Brother    Arthritis Brother    Depression Brother    Hyperlipidemia Brother    Hypertension Brother    Stroke Maternal Grandfather    Breast cancer Neg Hx     Social History:  reports that she has never smoked. She has never been exposed to tobacco smoke. She has never used smokeless tobacco. She reports that she does not drink alcohol and does not use drugs.  Allergies:  Allergies  Allergen Reactions   Vascepa [Icosapent Ethyl (Epa Ethyl Ester) (Fish)] Hives and Swelling   Aspir-81 [Aspirin] Other (See Comments)    Coagulation disorder, causes bruising    Gabapentin  Other (See Comments)    Cognitive/memory issues   Metformin And Related     indigestion   Mounjaro  [Tirzepatide ] Diarrhea    Epigastric pain    Peanut-Containing Drug Products     Tongue goes numb from peanuts   Statins Other (See Comments)    Joint pain   Topamax [Topiramate] Other (See Comments)    Cognitive issues    Voltaren [Diclofenac Sodium] Hives and Other (See Comments)   Lipitor [Atorvastatin]     Joint pain    Medications reviewed.    ROS Full ROS performed and is otherwise negative other than what is stated in HPI   BP (!) 147/82   Pulse 78   Ht 5' 6 (1.676 m)   Wt 276 lb (125.2 kg)   SpO2 98%   BMI 44.55 kg/m   Physical Exam Vitals and nursing note reviewed. Exam conducted with a chaperone present.  Constitutional:      General: She is not in acute distress.    Appearance: Normal appearance. She is obese.  Pulmonary:     Effort: Pulmonary effort is normal.     Breath sounds: No stridor.  Abdominal:     General: Abdomen is flat. There is no distension.     Palpations: Abdomen is soft. There is no mass.     Tenderness: There is no abdominal tenderness. There is no guarding or rebound.     Hernia: No hernia is  present.     Comments: Scars w/o issues or hernias  Skin:    General: Skin is warm and dry.     Capillary Refill: Capillary refill takes less than 2 seconds.  Neurological:     General: No focal deficit present.     Mental Status: She is alert.  Psychiatric:        Mood and Affect: Mood normal.        Behavior: Behavior normal.  Thought Content: Thought content normal.        Judgment: Judgment normal.       Assessment/Plan: Doing very well w/o surgical complications No restrictions  RTC prn I personally spent a total of 20 minutes in the care of the patient today including performing a medically appropriate exam/evaluation, counseling and educating, placing orders, referring and communicating with other health care professionals, documenting clinical information in the EHR, independently interpreting and reviewing images studies and coordinating care.   Laneta Luna, MD [facility] General Surgeon

## 2023-09-29 ENCOUNTER — Other Ambulatory Visit: Payer: Self-pay | Admitting: Family Medicine

## 2023-09-29 DIAGNOSIS — G2581 Restless legs syndrome: Secondary | ICD-10-CM

## 2023-10-17 DIAGNOSIS — E119 Type 2 diabetes mellitus without complications: Secondary | ICD-10-CM | POA: Diagnosis not present

## 2023-10-18 LAB — LAB REPORT - SCANNED
Albumin, Urine POC: 0.9
Creatinine, POC: 1.09 mg/dL
EGFR: 55
Microalb Creat Ratio: 177
Microalb Creat Ratio: 5

## 2023-10-26 DIAGNOSIS — G3184 Mild cognitive impairment, so stated: Secondary | ICD-10-CM | POA: Diagnosis not present

## 2023-10-26 DIAGNOSIS — Z1331 Encounter for screening for depression: Secondary | ICD-10-CM | POA: Diagnosis not present

## 2023-10-26 DIAGNOSIS — F339 Major depressive disorder, recurrent, unspecified: Secondary | ICD-10-CM | POA: Diagnosis not present

## 2023-10-26 DIAGNOSIS — G2581 Restless legs syndrome: Secondary | ICD-10-CM | POA: Diagnosis not present

## 2023-10-26 DIAGNOSIS — G479 Sleep disorder, unspecified: Secondary | ICD-10-CM | POA: Diagnosis not present

## 2023-10-26 NOTE — Progress Notes (Signed)
 Today the history is gathered from: 100% - patient  0% - alone  RECORDS SUMMARY: No pertinent records.  REFERRING PHYSICIAN: Self PRIMARY CARE PHYSICIAN:  Sowles, Dorette FALCON, MD   IMPRESSION/PLAN  [name] is a 73 y.o. female presenting for evaluation of  MILD COGNITIVE IMPAIRMENT  Patient with PMH of HTN, dyslipidemia, T2DM, MDD, with memory difficulty, worsening.  Endorses strong family hx of dementia. SLUMS today was 20/30. Assessment & Plan Mild cognitive impairment vs mild dementia Memory has worsened since the last visit, with difficulties in short-term memory, word finding, and misplacing items. Memory score decreased from 24/30 to 20/30, indicating borderline mild cognitive impairment and very mild dementia. Contributing factors include recent emotional stress, hospitalizations, and poor sleep. Family history of Alzheimer's disease is present. Discussed potential benefits of memory medication, which aims to preserve current memory function without reversing or halting memory loss. - Resend referral to [facility]. - Provide information on memory medication (Aricept/donepezil). - Consider starting Aricept 5 mg if memory issues persist after addressing sleep and emotional factors.  Restless legs syndrome Restless legs syndrome persists, interfering with sleep. Currently managed with ropinirole  2 mg at night and magnesium supplementation. Symptoms occasionally occur during the day. - Adjust timing of ropinirole  to one hour before bedtime. - Consider adding a low dose of ropinirole  in the morning if daytime symptoms persist.  Obstructive sleep apnea Diagnosed with obstructive sleep apnea via PSG. CPAP prescribed but not used due to discomfort with mask and pressure settings. Poor sleep quality impacting memory and daily functioning. Discussed exploring different mask options and adjusting pressure settings to improve comfort and compliance. - Contact CPAP supplier  to explore different mask options and adjust pressure settings.  Chronic headaches Chronic headaches localized to one side and occasionally radiating to the back of the head.  Depression Emotional coping affected by recent family death and personal health issues. Depression may be contributing to memory issues. Discussed addressing depression if it worsens, potentially through counseling or psychiatry. - Consider addressing depression if it worsens, potentially through counseling or psychiatry.  Insomnia Insomnia with difficulty falling asleep, sometimes staying awake until 3-4 AM. Sleep pattern disrupted, affecting daily routine and memory. Discussed increasing Seroquel  dose to improve sleep and adjusting timing for better efficacy. - Increase Seroquel  dose to 50 mg at night to improve sleep. - Adjust timing of Seroquel  to one hour before bedtime.  Follow-up in 2 to 3 months   Medications previously tried:   CHIEF COMPLAINT & HPI  [name] is a 73 y.o. female presenting for evaluation of: Chief Complaint  Patient presents with  . MCI  . RLS       . Sleep Apnea  . Headache    MILD COGNITIVE IMPAIRMENT History of Present Illness Sarah Gordon is a 73 year old female with mild cognitive impairment who presents for follow-up. She is accompanied by her husband, Will. A referral was sent to [facility] for evaluation of her cognitive impairment.  Memory has worsened since the last visit six months ago, with difficulty remembering dates, appointments, and sometimes the day, leading to frustration. Short-term memory is affected, while long-term memory remains unchanged. Occasional word-finding difficulty and jumbled speech occur, but no slurred speech. Frequently misplaces items, such as her purse, several times a week. Despite these challenges, she manages medications, meals, and bills well and continues to drive without difficulty, using navigation aids.  Restless legs  syndrome interferes with sleep. She takes ropinirole  2  mg nightly and magnesium glycinate 400-600 mg, which she finds helpful. Despite medication, she struggles to fall asleep, sometimes staying awake until 3 or 4 AM and sleeping until noon. Occasionally experiences restless legs during the day.  Headaches are described as nagging and localized to one side, sometimes radiating to the back of the head. She takes baclofen  as needed for relief.  A sleep study was performed, and the patient reports that she was prescribed CPAP therapy. However, she has not used the CPAP since her hospital stay due to discomfort with the mask and pressure settings.  Her emotional state has been affected by recent family bereavements and personal health issues, leading to fluctuating emotions and occasional sadness. 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  She was seen in the ER on September 08, 2023, for chest discomfort and headache, but the workup was unremarkable and she was discharged home.   DATA SUMMARY: 09/08/2022 US  VENOUS IMG LOWER RIGHT IMPRESSION:  1. No evidence of DVT within the right lower extremity.  2. The examination is positive for age-indeterminate occlusive  superficial thrombophlebitis involving a prominent superficial  varicosity within the proximal thigh originating from but not  apparently involving the right greater saphenous vein. The mixed  echogenic appearance of the thrombus suggests a chronic etiology  though in the absence of prior examinations, an acute on chronic  process is not excluded.   12/02/21 MRI BRAIN WO CONTRAST IMPRESSION:  1. Partially empty sella which can be a normal variant but can also  be seen in the setting of intracranial hypertension. Otherwise  normal appearance of the brain.  2. Small left mastoid effusion.   02/25/2017 CT HEAD WO IMPRESSION:  1. Left scalp soft tissue injury without  underlying fracture.  2. Normal noncontrast CT appearance of the brain aside from chronic  partially empty sella, often a normal anatomic variant but can be  associated with idiopathic intracranial hypertension (pseudotumor  cerebri).   VISIT SUMMARIES:   MEDICATIONS Current Outpatient Medications  Medication Sig Dispense Refill  . acetaminophen  (TYLENOL ) 650 MG ER tablet Take 650 mg by mouth every 8 (eight) hours as needed for Pain    . baclofen  (LIORESAL ) 10 MG tablet Take baclofen  10mg  as needed for headache. Do not take with tramadol . 30 tablet 1  . DULoxetine  (CYMBALTA ) 60 MG DR capsule Take 60 mg by mouth once daily    . ezetimibe  (ZETIA ) 10 mg tablet Take 10 mg by mouth once daily    . levothyroxine  (SYNTHROID ) 100 MCG tablet Take 100 mcg by mouth once daily Take on an empty stomach with a glass of water at least 30-60 minutes before breakfast.    . meloxicam  (MOBIC ) 15 MG tablet Take 15 mg by mouth once daily    . montelukast  (SINGULAIR ) 10 mg tablet Take 10 mg by mouth at bedtime    . omeprazole  (PRILOSEC OTC) 20 MG EC tablet Take 20 mg by mouth 2 (two) times daily    . QUEtiapine  (SEROQUEL ) 25 MG tablet Take 25 mg by mouth at bedtime    . albuterol  sulfate 90 mcg/actuation aebs Inhale into the lungs    . fluticasone  propionate (FLOVENT  HFA) 44 mcg/actuation inhaler Inhale 2 inhalations into the lungs 2 (two) times daily (Patient not taking: Reported on 10/26/2023)    . hydrOXYzine  HCL (ATARAX ) 10 MG tablet Take by mouth 3 (three) times daily as needed for Itching    .  traMADoL  (ULTRAM ) 50 mg tablet Take 50 mg by mouth every 6 (six) hours as needed for Pain (Patient not taking: Reported on 10/26/2023)    . triamcinolone  0.025 % cream Apply topically 2 (two) times daily (Patient not taking: Reported on 10/26/2023)     No current facility-administered medications for this visit.    ALLERGIES Allergies  Allergen Reactions  . Aspirin Other (See Comments)    bruising  . Gabapentin   Other (See Comments)    Memory issues, communication issues  . Lipitor [Atorvastatin] Other (See Comments)    Cognitive difficulties  . Statins-Hmg-Coa Reductase Inhibitors Other (See Comments)    Cognitive difficulties  . Topamax [Topiramate] Other (See Comments)    Cognitive difficulties/memory difficulties  . Vascepa [Icosapent Ethyl] Hives and Swelling  . Voltaren [Diclofenac Sodium] Hives     EXAM   Vitals:   10/26/23 0808  BP: (!) 142/86  Weight: (!) 124.7 kg (275 lb)  Height: 167.6 cm (5' 6)  PainSc:   4  PainLoc: Back     Body mass index is 44.39 kg/m.  GENERAL: Very pleasant female, no acute distress. Normocephalic and atraumatic.  Baseline neurological exam below was obtained at prior office visit. Changes from today's visit appear in bold.   EYES: PERRLA EOM's intact  MUSCULOSKELETAL: Bulk - Normal Tone - Normal Pronator Drift - Absent bilaterally. Ambulation - Gait and station is mildly unsteady; ambulating with walker  Romberg - mildly positive  R/L 5/5    Shoulder abduction (deltoid/supraspinatus, axillary/suprascapular n, C5) 5/5    Elbow flexion (biceps brachii, musculoskeletal n, C5-6) 5/5    Elbow extension (triceps, radial n, C7) 5/5    Finger adduction (interossei, ulnar n, T1)  5/5    Hip flexion (iliopsoas, L1/L2) 5/5    Knee flexion (hamstrings, sciatic n, L5/S1)  5/5    Knee extension (quadriceps, femoral n, L3/4) 5/5    Ankle dorsiflexion (tibialis anterior, deep fibular n, L4/5) 5/5    Ankle plantarflexion (gastroc, tibial n, S1)   NEUROLOGICAL: MENTAL STATUS: Patient is oriented to person, place and time.   Short-term memory is intact Long-term memory is intact.   Attention span and concentration are intact.   Naming and repetition are intact. Comprehension is intact.   Expressive speech is intact.   Patient's fund of knowledge is within normal limits for educational level.  CRANIAL NERVES: Visual acuity and visual fields  are intact         Extraocular muscles are intact                        Facial sensation is intact bilaterally                Facial strength is intact bilaterally                   Hearing is intact bilaterally                              Palate elevates midline, normal phonation     Shoulder shrug strength is intact                    Tongue protrudes midline                       SENSATION: Pain and temperature (spinothalamic tracts) is normal. Position and vibration (dorsal columns) is normal.  COORDINATION/CEREBELLAR: Finger to nose testing is intact.  REFLEXES: Negative Hoffman's sign bilaterally.     PAST MEDICAL HISTORY Past Medical History:  Diagnosis Date  . Arthritis   . History of stroke   . Hyperlipidemia   . Hypertension   . IBS (irritable bowel syndrome)   . Thyroid  disease     PAST SURGICAL HISTORY No past surgical history on file.  FAMILY HISTORY No family history on file.  SOCIAL HISTORY  Social History   Tobacco Use  . Smoking status: Never    Passive exposure: Never  . Smokeless tobacco: Never  Vaping Use  . Vaping status: Never Used  Substance Use Topics  . Alcohol use: Not Currently  . Drug use: Not Currently     REVIEW OF SYSTEMS:  13 system ROS was verbally reviewed with patient. Pertinent positives and negatives are mentioned above in the HPI and all other systems are negative.   DATA   No visits with results within 6 Month(s) from this visit.  Latest known visit with results is:  Office Visit on 04/25/2023  Component Date Value Ref Range Status  . Magnesium 04/25/2023 1.8  1.8 - 2.5 mg/dL Final  . Ferritin 97/96/7974 184  11 - 307 ng/mL Final     No follow-ups on file.  Payor: [employer]VANTAGE PLANS / Plan: [employer] HMO GOLD PLUS / Product Type: HMO /     MEGAN SIMPSON, CMA   I agree that the scribe documentation is complete and accurate.  This note was generated in part with voice recognition software  and I apologize for any typographical errors that were not detected and corrected.     Attestation Statement:   I personally performed the service, non-incident to. [facility])   SARAH ELIZABETH MASON, PA       This note has been created using automated tools and reviewed for accuracy by [facility].

## 2023-11-01 ENCOUNTER — Other Ambulatory Visit: Payer: Self-pay | Admitting: Family Medicine

## 2023-11-01 DIAGNOSIS — E1169 Type 2 diabetes mellitus with other specified complication: Secondary | ICD-10-CM

## 2023-11-01 DIAGNOSIS — F3340 Major depressive disorder, recurrent, in remission, unspecified: Secondary | ICD-10-CM

## 2023-11-01 DIAGNOSIS — E039 Hypothyroidism, unspecified: Secondary | ICD-10-CM

## 2023-11-01 DIAGNOSIS — K219 Gastro-esophageal reflux disease without esophagitis: Secondary | ICD-10-CM

## 2023-11-03 ENCOUNTER — Encounter: Payer: Self-pay | Admitting: Family Medicine

## 2023-11-03 ENCOUNTER — Ambulatory Visit: Admitting: Family Medicine

## 2023-11-03 VITALS — BP 126/84 | HR 96 | Resp 16 | Ht 66.0 in | Wt 275.2 lb

## 2023-11-03 DIAGNOSIS — I152 Hypertension secondary to endocrine disorders: Secondary | ICD-10-CM | POA: Diagnosis not present

## 2023-11-03 DIAGNOSIS — E669 Obesity, unspecified: Secondary | ICD-10-CM | POA: Diagnosis not present

## 2023-11-03 DIAGNOSIS — E1169 Type 2 diabetes mellitus with other specified complication: Secondary | ICD-10-CM | POA: Diagnosis not present

## 2023-11-03 DIAGNOSIS — F330 Major depressive disorder, recurrent, mild: Secondary | ICD-10-CM | POA: Diagnosis not present

## 2023-11-03 DIAGNOSIS — E785 Hyperlipidemia, unspecified: Secondary | ICD-10-CM

## 2023-11-03 DIAGNOSIS — G4733 Obstructive sleep apnea (adult) (pediatric): Secondary | ICD-10-CM

## 2023-11-03 DIAGNOSIS — E1159 Type 2 diabetes mellitus with other circulatory complications: Secondary | ICD-10-CM | POA: Diagnosis not present

## 2023-11-03 DIAGNOSIS — J4520 Mild intermittent asthma, uncomplicated: Secondary | ICD-10-CM | POA: Diagnosis not present

## 2023-11-03 DIAGNOSIS — G3184 Mild cognitive impairment, so stated: Secondary | ICD-10-CM | POA: Diagnosis not present

## 2023-11-03 DIAGNOSIS — E538 Deficiency of other specified B group vitamins: Secondary | ICD-10-CM | POA: Diagnosis not present

## 2023-11-03 DIAGNOSIS — I4902 Ventricular flutter: Secondary | ICD-10-CM

## 2023-11-03 DIAGNOSIS — E039 Hypothyroidism, unspecified: Secondary | ICD-10-CM

## 2023-11-03 LAB — POCT GLYCOSYLATED HEMOGLOBIN (HGB A1C): Hemoglobin A1C: 6.2 % — AB (ref 4.0–5.6)

## 2023-11-03 MED ORDER — METOPROLOL SUCCINATE ER 25 MG PO TB24: 25.0000 mg | ORAL_TABLET | Freq: Every day | ORAL | 1 refills | Status: AC

## 2023-11-03 MED ORDER — MONTELUKAST SODIUM 10 MG PO TABS: 10.0000 mg | ORAL_TABLET | Freq: Every day | ORAL | 1 refills | Status: AC

## 2023-11-03 MED ORDER — DULOXETINE HCL 60 MG PO CPEP: 60.0000 mg | ORAL_CAPSULE | Freq: Every day | ORAL | 1 refills | Status: AC

## 2023-11-03 MED ORDER — BUPROPION HCL ER (XL) 150 MG PO TB24: 150.0000 mg | ORAL_TABLET | Freq: Every morning | ORAL | 1 refills | Status: DC

## 2023-11-03 NOTE — Progress Notes (Signed)
 Name: Sarah Gordon   MRN: [id number]    DOB: 07/15/1950   Date:11/03/2023       Progress Note  Subjective  Chief Complaint  Chief Complaint  Patient presents with   Medical Management of Chronic Issues   Discussed the use of AI scribe software for clinical note transcription with the patient, who gave verbal consent to proceed.  History of Present Illness Sarah Gordon is a 73 year old female who presents for a routine follow-up after a recent hospitalization for severe dehydration.  In June, she was hospitalized for three days due to severe dehydration following a near syncopal episode, accompanied by nausea, vomiting, and diarrhea, leading to acute kidney injury. During this hospitalization, her TSH levels were abnormal. She has not had any blood work since the hospitalization to check her kidney function or thyroid  levels.  She experiences ongoing issues with her thermoregulation, including excessive sweating with minimal activity. She also has shortness of breath primarily with activity but denies any chest tightness.  She has a significant family history of Alzheimer's disease and is currently experiencing memory issues, including short-term memory loss and repeating herself. She has been evaluated by a neurologist and a PA, with a SLUMS test score of 20 out of 30, indicating possible mild cognitive impairment or mild dementia. She has not yet started on Aricept and is awaiting an appointment with the [facility].  Her history of major depression has been exacerbated by recent events, including the death of her sister from Alzheimer's disease. She reports increased anxiety and depression, with episodes of crying and anxiety attacks. She is currently taking duloxetine  60 mg and Wellbutrin  100 mg for depression.  She has resigned from her job at [employer] due to difficulties managing responsibilities, which has been a source of frustration and self-anger. She  continues to help care for her granddaughter  Roselyn.  Her current medications include metoprolol  25 mg for ventricular flutter, levothyroxine  100 mcg for hypothyroidism, and ezetimibe  for cholesterol management. She is not taking Repatha  or olmesartan  HCTZ, as these were stopped during her hospitalization. She also takes Seroquel  50 mg for sleep and Requip  2 mg for restless leg syndrome.  She has morbid  obesity and is managing her weight through diet, focusing on smaller portions and controlling carbohydrate intake.   She has type 2 diabetes associated with obesity, HTN and dyslipidemia, her current A1c is 6.2, indicating good control of her diabetes through diet alone. She is due for labs, only taking Zetia  for cholesterol, states could not tolerate Repatha . Unable to tolerate GLP-1 agonists or SGL-2 agonists. She denies polyphagia, polydipsia or polyuria.   She has  sleep apnea but has struggled to use her CPAP machine consistently. She reports sleeping better without it but acknowledges the importance of using it for her health.  She takes B12 and folic acid  supplements and reports no issues with urine output or itching. She experiences hair thinning and constipation, which may be related to her thyroid  condition.  She takes omeprazole  for reflux, which has improved since her gallbladder surgery. She no longer experiences gluten intolerance symptoms since the surgery.    Patient Active Problem List   Diagnosis Date Noted   AKI (acute kidney injury) (HCC) 09/04/2023   Near syncope 09/04/2023   Calculus of gallbladder with acute cholecystitis without obstruction 05/24/2023   Acute cholecystitis 05/24/2023   Background diabetic retinopathy (HCC) 01/03/2023   Varicose veins with inflammation 07/16/2022   Cognitive  complaints 10/27/2021   Polyp of colon    Lumbar radiculopathy 11/06/2020   Ventricular flutter (HCC) 12/06/2018   MDD (major depressive disorder), recurrent episode, mild  (HCC) 09/04/2018   DDD (degenerative disc disease), cervical 01/23/2016   Degenerative joint disease of left acromioclavicular joint 01/23/2016   Recurrent oral herpes simplex infection 09/01/2015   Irritable bowel syndrome with diarrhea 08/07/2015   Statin intolerance 02/28/2015   Asthma, intermittent 09/10/2014   Controlled type 2 diabetes with neuropathy (HCC) 09/10/2014   Fibromyalgia 09/10/2014   Obstructive sleep apnea 09/10/2014   Hyperlipidemia 12/29/2012   Essential hypertension 12/29/2012   Morbid obesity (HCC) 12/29/2012   Arthritis due to gout 12/13/2007   Acid reflux 10/24/2006   Adult hypothyroidism 10/24/2006   Localized osteoarthrosis 10/24/2006    Past Surgical History:  Procedure Laterality Date   BREAST BIOPSY Left    benign   CATARACT EXTRACTION W/PHACO Left 01/18/2023   Procedure: CATARACT EXTRACTION PHACO AND INTRAOCULAR LENS PLACEMENT (IOC) LEFT DIABETIC  CLAREON VIVITY TORIC LENS;  Surgeon: Jaye Fallow, MD;  Location: [facility];  Service: Ophthalmology;  Laterality: Left;  4.47 0:31.9   CATARACT EXTRACTION W/PHACO Right 02/01/2023   Procedure: CATARACT EXTRACTION PHACO AND INTRAOCULAR LENS PLACEMENT (IOC) RIGHT DIABETIC  CLAREON VIVITY TORIC LENS 3.68 00:28.9;  Surgeon: Jaye Fallow, MD;  Location: [facility];  Service: Ophthalmology;  Laterality: Right;   CHOLECYSTECTOMY     COLONOSCOPY     Dr Desiderio   COLONOSCOPY WITH PROPOFOL  N/A 11/19/2015   Procedure: COLONOSCOPY WITH PROPOFOL ;  Surgeon: Louanne KANDICE Muse, MD;  Location: [facility];  Service: Endoscopy;  Laterality: N/A;   COLONOSCOPY WITH PROPOFOL  N/A 06/18/2021   Procedure: COLONOSCOPY WITH PROPOFOL ;  Surgeon: Unk Corinn Skiff, MD;  Location: [facility];  Service: Gastroenterology;  Laterality: N/A;   ESOPHAGOGASTRODUODENOSCOPY N/A 08/20/2014   Procedure: ESOPHAGOGASTRODUODENOSCOPY (EGD);  Surgeon: Louanne KANDICE Muse, MD;  Location: [facility];   Service: Endoscopy;  Laterality: N/A;   ESOPHAGOGASTRODUODENOSCOPY (EGD) WITH PROPOFOL  N/A 06/18/2021   Procedure: ESOPHAGOGASTRODUODENOSCOPY (EGD) WITH PROPOFOL ;  Surgeon: Unk Corinn Skiff, MD;  Location: [facility] END[facility];  Service: Gastroenterology;  Laterality: N/A;   FOOT SURGERY Left    heel spur   HERNIA REPAIR     umbilical   NASAL SINUS SURGERY  1980s   UMBILICAL HERNIA REPAIR      Family History  Problem Relation Age of Onset   Hypertension Mother    Hyperlipidemia Mother    Diabetes Mother    Heart disease Mother    Stroke Mother    Varicose Veins Mother    Alcohol abuse Father    Lupus Sister    Arthritis Sister    Depression Sister    Obesity Sister    Asthma Sister    Depression Sister    Hypertension Sister    Miscarriages / Stillbirths Sister    Varicose Veins Sister    Depression Sister    Arthritis Sister    Heart disease Sister    Hyperlipidemia Sister    Hypertension Sister    Miscarriages / [place] Sister    Varicose Veins Sister    Dementia Sister    Miscarriages / Stillbirths Sister    Arthritis Sister    Depression Sister    Heart disease Sister    Hyperlipidemia Sister    Hypertension Sister    Heart disease Brother 52       CABG x 3    Heart attack Brother 40   Hyperlipidemia  Brother    Hypertension Brother    Diabetes Brother    Hypertension Brother    Obesity Brother    Asthma Brother    Depression Brother    Hyperlipidemia Brother    Alcohol abuse Brother    Heart disease Brother    Stroke Brother    Varicose Veins Brother    Early death Brother    Stroke Brother    Arthritis Brother    Depression Brother    Hyperlipidemia Brother    Hypertension Brother    Stroke Maternal Grandfather    Breast cancer Neg Hx     Social History   Tobacco Use   Smoking status: Never    Passive exposure: Never   Smokeless tobacco: Never  Substance Use Topics   Alcohol use: No     Current Outpatient Medications:     acetaminophen  (TYLENOL ) 500 MG tablet, Take 1,000 mg by mouth every 6 (six) hours as needed for moderate pain (pain score 4-6)., Disp: , Rfl:    ezetimibe  (ZETIA ) 10 MG tablet, Take 1 tablet (10 mg total) by mouth daily., Disp: 90 tablet, Rfl: 0   folic acid  (FOLVITE ) 800 MCG tablet, Take 800 mcg by mouth daily., Disp: , Rfl:    hydrOXYzine  (ATARAX ) 10 MG tablet, Take 1 tablet (10 mg total) by mouth 3 (three) times daily as needed for anxiety., Disp: 30 tablet, Rfl: 0   levothyroxine  (SYNTHROID ) 100 MCG tablet, TAKE 1 TABLET BY MOUTH DAILY, Disp: 90 tablet, Rfl: 1   Multiple Vitamin (MULTIVITAMIN) tablet, Take 1 tablet by mouth daily., Disp: , Rfl:    omeprazole  (PRILOSEC) 20 MG capsule, Take 1 capsule (20 mg total) by mouth 2 (two) times daily before a meal., Disp: 180 capsule, Rfl: 0   ondansetron  (ZOFRAN ) 4 MG tablet, Take 1 tablet (4 mg total) by mouth every 8 (eight) hours as needed for nausea or vomiting., Disp: 20 tablet, Rfl: 0   Polyethyl Glycol-Propyl Glycol (SYSTANE OP), Place 1 drop into both eyes daily as needed (dry eyes)., Disp: , Rfl:    QUEtiapine  (SEROQUEL ) 50 MG tablet, Take 50 mg by mouth at bedtime., Disp: , Rfl:    rOPINIRole  (REQUIP ) 2 MG tablet, TAKE 1 TABLET BY MOUTH AT BEDTIME, Disp: 30 tablet, Rfl: 0   tiZANidine  (ZANAFLEX ) 4 MG tablet, TAKE 1 TABLET BY MOUTH EVERY NIGHT AT BEDTIME AS NEEDED FOR MUSCLE SPASMS, Disp: 90 tablet, Rfl: 1   buPROPion  (WELLBUTRIN  XL) 150 MG 24 hr tablet, Take 1 tablet (150 mg total) by mouth every morning., Disp: 90 tablet, Rfl: 1   DULoxetine  (CYMBALTA ) 60 MG capsule, Take 1 capsule (60 mg total) by mouth daily., Disp: 90 capsule, Rfl: 1   metoprolol  succinate (TOPROL -XL) 25 MG 24 hr tablet, Take 1 tablet (25 mg total) by mouth daily., Disp: 90 tablet, Rfl: 1   montelukast  (SINGULAIR ) 10 MG tablet, Take 1 tablet (10 mg total) by mouth at bedtime., Disp: 90 tablet, Rfl: 1  Allergies  Allergen Reactions   Vascepa [Icosapent Ethyl (Epa Ethyl  Ester) (Fish)] Hives and Swelling   Aspir-81 [Aspirin] Other (See Comments)    Coagulation disorder, causes bruising    Gabapentin  Other (See Comments)    Cognitive/memory issues   Metformin And Related     indigestion   Mounjaro  [Tirzepatide ] Diarrhea    Epigastric pain    Peanut-Containing Drug Products     Tongue goes numb from peanuts   Statins Other (See Comments)    Joint pain  Topamax [Topiramate] Other (See Comments)    Cognitive issues    Voltaren [Diclofenac Sodium] Hives and Other (See Comments)   Lipitor [Atorvastatin]     Joint pain    I personally reviewed active problem list, medication list, allergies with the patient/caregiver today.   ROS  Ten systems reviewed and is negative except as mentioned in HPI    Objective Physical Exam CONSTITUTIONAL: Patient appears well-developed and well-nourished.  No distress. HEENT: Head atraumatic, normocephalic, neck supple. CARDIOVASCULAR: Normal rate, regular rhythm and normal heart sounds.  No murmur heard. No BLE edema. PULMONARY: Effort normal and breath sounds normal. No respiratory distress. ABDOMINAL: There is no tenderness or distention. MUSCULOSKELETAL: Normal gait. Without gross motor or sensory deficit. PSYCHIATRIC: Patient has a normal mood and affect. behavior is normal. Judgment and thought content normal.  Vitals:   11/03/23 0839  BP: 126/84  Pulse: 96  Resp: 16  SpO2: 97%  Weight: 275 lb 3.2 oz (124.8 kg)  Height: 5' 6 (1.676 m)    Body mass index is 44.42 kg/m.  Recent Results (from the past 2160 hours)  Comprehensive metabolic panel     Status: Abnormal   Collection Time: 09/04/23  8:51 AM  Result Value Ref Range   Sodium 134 (L) 135 - 145 mmol/L   Potassium 4.8 3.5 - 5.1 mmol/L   Chloride 105 98 - 111 mmol/L   CO2 22 22 - 32 mmol/L   Glucose, Bld 179 (H) 70 - 99 mg/dL    Comment: Glucose reference range applies only to samples taken after fasting for at least 8 hours.   BUN 56 (H) 8  - 23 mg/dL   Creatinine, Ser 7.71 (H) 0.44 - 1.00 mg/dL   Calcium  8.4 (L) 8.9 - 10.3 mg/dL   Total Protein 6.6 6.5 - 8.1 g/dL   Albumin  3.5 3.5 - 5.0 g/dL   AST 32 15 - 41 U/L   ALT 25 0 - 44 U/L   Alkaline Phosphatase 85 38 - 126 U/L   Total Bilirubin 0.7 0.0 - 1.2 mg/dL   GFR, Estimated 23 (L) >60 mL/min    Comment: (NOTE) Calculated using the CKD-EPI Creatinine Equation (2021)    Anion gap 7 5 - 15    Comment: Performed at [facility], [address] [place], [place] [address]  CBC     Status: Abnormal   Collection Time: 09/04/23  8:51 AM  Result Value Ref Range   WBC 5.1 4.0 - 10.5 K/uL   RBC 3.66 (L) 3.87 - 5.11 MIL/uL   Hemoglobin 11.5 (L) 12.0 - 15.0 g/dL   HCT 65.6 (L) 63.9 - 53.9 %   MCV 93.7 80.0 - 100.0 fL   MCH 31.4 26.0 - 34.0 pg   MCHC 33.5 30.0 - 36.0 g/dL   RDW 86.7 88.4 - 84.4 %   Platelets 262 150 - 400 K/uL   nRBC 0.0 0.0 - 0.2 %    Comment: Performed at [facility], [address] [place], [place] [address]  Troponin I (High Sensitivity)     Status: None   Collection Time: 09/04/23  8:51 AM  Result Value Ref Range   Troponin I (High Sensitivity) 5 <18 ng/L    Comment: (NOTE) Elevated high sensitivity troponin I (hsTnI) values and significant  changes across serial measurements may suggest ACS but many other  chronic and acute conditions are known to elevate hsTnI results.  Refer to the Links section for chest pain algorithms and  additional  guidance. Performed at [facility], [address] [place], [place] [address]   TSH     Status: Abnormal   Collection Time: 09/04/23  8:51 AM  Result Value Ref Range   TSH 5.000 (H) 0.350 - 4.500 uIU/mL    Comment: Performed by a 3rd Generation assay with a functional sensitivity of <=0.01 uIU/mL. Performed at [facility], [address] [place], [place] [address]   CK     Status: None   Collection Time: 09/04/23  8:51 AM  Result Value Ref Range   Total CK 165  38 - 234 U/L    Comment: HEMOLYSIS AT THIS LEVEL MAY AFFECT RESULT Performed at [facility], [address] [place], [place] [address]   Uric acid     Status: Abnormal   Collection Time: 09/04/23  8:51 AM  Result Value Ref Range   Uric Acid, Serum 14.6 (H) 2.5 - 7.1 mg/dL    Comment: HEMOLYSIS AT THIS LEVEL MAY AFFECT RESULT Performed at [facility], [address] [place], [place] [address]   Troponin I (High Sensitivity)     Status: None   Collection Time: 09/04/23 10:49 AM  Result Value Ref Range   Troponin I (High Sensitivity) 5 <18 ng/L    Comment: (NOTE) Elevated high sensitivity troponin I (hsTnI) values and significant  changes across serial measurements may suggest ACS but many other  chronic and acute conditions are known to elevate hsTnI results.  Refer to the Links section for chest pain algorithms and additional  guidance. Performed at [facility], [address] [place], [place] [address]   Urinalysis, Routine w reflex microscopic -Urine, Clean Catch     Status: Abnormal   Collection Time: 09/04/23  1:40 PM  Result Value Ref Range   Color, Urine YELLOW (A) YELLOW   APPearance CLEAR (A) CLEAR   Specific Gravity, Urine 1.011 1.005 - 1.030   pH 5.0 5.0 - 8.0   Glucose, UA NEGATIVE NEGATIVE mg/dL   Hgb urine dipstick NEGATIVE NEGATIVE   Bilirubin Urine NEGATIVE NEGATIVE   Ketones, ur NEGATIVE NEGATIVE mg/dL   Protein, ur NEGATIVE NEGATIVE mg/dL   Nitrite NEGATIVE NEGATIVE   Leukocytes,Ua NEGATIVE NEGATIVE    Comment: Performed at [facility], [address] [place], [place] [address]  Cortisol     Status: None   Collection Time: 09/04/23  2:45 PM  Result Value Ref Range   Cortisol, Plasma 4.1 ug/dL    Comment: (NOTE) AM    6.7 - 22.6 ug/dL PM   <89.9       ug/dL Performed at [facility], [address] [place], [place] [address]   Basic metabolic panel     Status: Abnormal   Collection Time: 09/05/23   2:41 AM  Result Value Ref Range   Sodium 140 135 - 145 mmol/L   Potassium 4.1 3.5 - 5.1 mmol/L   Chloride 113 (H) 98 - 111 mmol/L   CO2 19 (L) 22 - 32 mmol/L   Glucose, Bld 128 (H) 70 - 99 mg/dL    Comment: Glucose reference range applies only to samples taken after fasting for at least 8 hours.   BUN 46 (H) 8 - 23 mg/dL   Creatinine, Ser 8.34 (H) 0.44 - 1.00 mg/dL   Calcium  8.0 (L) 8.9 - 10.3 mg/dL   GFR, Estimated 33 (L) >60 mL/min    Comment: (NOTE) Calculated using the CKD-EPI Creatinine Equation (2021)  Anion gap 8 5 - 15    Comment: Performed at [facility], [address] [place], [place] [address]  CBC     Status: Abnormal   Collection Time: 09/05/23  2:41 AM  Result Value Ref Range   WBC 4.5 4.0 - 10.5 K/uL   RBC 3.16 (L) 3.87 - 5.11 MIL/uL   Hemoglobin 9.7 (L) 12.0 - 15.0 g/dL   HCT 70.2 (L) 63.9 - 53.9 %   MCV 94.0 80.0 - 100.0 fL   MCH 30.7 26.0 - 34.0 pg   MCHC 32.7 30.0 - 36.0 g/dL   RDW 86.7 88.4 - 84.4 %   Platelets 218 150 - 400 K/uL   nRBC 0.0 0.0 - 0.2 %    Comment: Performed at [facility], [address] [place], [place] [address]  Comprehensive metabolic panel with GFR     Status: Abnormal   Collection Time: 09/06/23  8:24 AM  Result Value Ref Range   Sodium 140 135 - 145 mmol/L   Potassium 4.8 3.5 - 5.1 mmol/L   Chloride 110 98 - 111 mmol/L   CO2 23 22 - 32 mmol/L   Glucose, Bld 118 (H) 70 - 99 mg/dL    Comment: Glucose reference range applies only to samples taken after fasting for at least 8 hours.   BUN 30 (H) 8 - 23 mg/dL   Creatinine, Ser 8.67 (H) 0.44 - 1.00 mg/dL   Calcium  8.6 (L) 8.9 - 10.3 mg/dL   Total Protein 5.8 (L) 6.5 - 8.1 g/dL   Albumin  3.0 (L) 3.5 - 5.0 g/dL   AST 488 (H) 15 - 41 U/L   ALT 320 (H) 0 - 44 U/L   Alkaline Phosphatase 163 (H) 38 - 126 U/L   Total Bilirubin 0.7 0.0 - 1.2 mg/dL   GFR, Estimated 43 (L) >60 mL/min    Comment: (NOTE) Calculated using the CKD-EPI Creatinine Equation (2021)     Anion gap 7 5 - 15    Comment: Performed at [facility], [address] [place], [place] [address]  Basic metabolic panel     Status: Abnormal   Collection Time: 09/08/23  2:50 PM  Result Value Ref Range   Sodium 139 135 - 145 mmol/L   Potassium 4.4 3.5 - 5.1 mmol/L   Chloride 110 98 - 111 mmol/L   CO2 20 (L) 22 - 32 mmol/L   Glucose, Bld 95 70 - 99 mg/dL    Comment: Glucose reference range applies only to samples taken after fasting for at least 8 hours.   BUN 23 8 - 23 mg/dL   Creatinine, Ser 8.71 (H) 0.44 - 1.00 mg/dL   Calcium  8.8 (L) 8.9 - 10.3 mg/dL   GFR, Estimated 45 (L) >60 mL/min    Comment: (NOTE) Calculated using the CKD-EPI Creatinine Equation (2021)    Anion gap 9 5 - 15    Comment: Performed at [facility], [address] [place], [place] [address]  CBC     Status: Abnormal   Collection Time: 09/08/23  2:50 PM  Result Value Ref Range   WBC 5.3 4.0 - 10.5 K/uL   RBC 3.50 (L) 3.87 - 5.11 MIL/uL   Hemoglobin 11.1 (L) 12.0 - 15.0 g/dL   HCT 66.8 (L) 63.9 - 53.9 %   MCV 94.6 80.0 - 100.0 fL   MCH 31.7 26.0 - 34.0 pg   MCHC 33.5 30.0 - 36.0 g/dL   RDW 86.2 88.4 -  15.5 %   Platelets 259 150 - 400 K/uL   nRBC 0.0 0.0 - 0.2 %    Comment: Performed at [facility], [address] [place], [place] [address]  Troponin I (High Sensitivity)     Status: None   Collection Time: 09/08/23  2:50 PM  Result Value Ref Range   Troponin I (High Sensitivity) 6 <18 ng/L    Comment: (NOTE) Elevated high sensitivity troponin I (hsTnI) values and significant  changes across serial measurements may suggest ACS but many other  chronic and acute conditions are known to elevate hsTnI results.  Refer to the Links section for chest pain algorithms and additional  guidance. Performed at [facility], [address] [place], [place] [address]   Troponin I (High Sensitivity)     Status: None   Collection Time: 09/08/23  4:54 PM  Result  Value Ref Range   Troponin I (High Sensitivity) 6 <18 ng/L    Comment: (NOTE) Elevated high sensitivity troponin I (hsTnI) values and significant  changes across serial measurements may suggest ACS but many other  chronic and acute conditions are known to elevate hsTnI results.  Refer to the Links section for chest pain algorithms and additional  guidance. Performed at [facility], [address] [place], [place] [address]   Lab report - scanned     Status: None   Collection Time: 10/18/23 12:00 AM  Result Value Ref Range   Microalb Creat Ratio 5     Comment: Abstracted by HIM   Albumin , Urine POC 0.9     Comment: Abstracted by HIM   Microalb Creat Ratio 177     Comment: Abstracted by HIM   Creatinine, POC 1.09 mg/dL    Comment: Abstracted by HIM   EGFR 55.0     Comment: Abstracted by HIM  POCT glycosylated hemoglobin (Hb A1C)     Status: Abnormal   Collection Time: 11/03/23  8:42 AM  Result Value Ref Range   Hemoglobin A1C 6.2 (A) 4.0 - 5.6 %   HbA1c POC (<> result, manual entry)     HbA1c, POC (prediabetic range)     HbA1c, POC (controlled diabetic range)      Diabetic Foot Exam:     PHQ2/9:    11/03/2023    8:36 AM 06/16/2023   10:18 AM 05/09/2023    8:47 AM 03/31/2023    2:52 PM 01/04/2023   10:16 AM  Depression screen PHQ 2/9  Decreased Interest 1 0 1 0 1  Down, Depressed, Hopeless 1 0 1 0 1  PHQ - 2 Score 2 0 2 0 2  Altered sleeping 1 0 1 0 2  Tired, decreased energy 1 0 1 0 2  Change in appetite 1 0 0 0 1  Feeling bad or failure about yourself  0 0 0 0 0  Trouble concentrating 1 0 1 0 0  Moving slowly or fidgety/restless 0 0 0 0 0  Suicidal thoughts 0 0 0 0 0  PHQ-9 Score 6 0 5 0 7  Difficult doing work/chores Somewhat difficult Not difficult at all Somewhat difficult Not difficult at all Somewhat difficult    phq 9 is positive  Fall Risk:    11/03/2023    8:30 AM 06/16/2023   10:12 AM 05/23/2023    9:53 AM 05/09/2023    8:46 AM 03/31/2023     2:52 PM  Fall Risk   Falls in the past year? 0 1 0  1 0  Number falls in past yr: 0 0 0 0 0  Injury with Fall? 0 0 0 0 0  Risk for fall due to : No Fall Risks Impaired balance/gait  Impaired balance/gait No Fall Risks  Follow up Falls evaluation completed Falls evaluation completed  Falls prevention discussed;Education provided;Falls evaluation completed Falls prevention discussed;Education provided;Falls evaluation completed      Assessment & Plan Mild cognitive impairment vs mild dementia SLUMS score 20/30 suggests mild cognitive impairment or mild dementia. Discussed that medications may slow progression, emphasizing early treatment. - Discuss starting Aricept with PA this week. - Ensure primary contact for medical appointments is updated to her husband. - Attend appointment at [facility].  Major depressive disorder, recurrent, mild and generalized anxiety disorder Increased depression and anxiety due to family stressors. Currently on duloxetine  and Wellbutrin . Prefers to start memory medication before adjusting depression medication. Discussed interrelation of neurological and psychiatric conditions. - Continue current doses of duloxetine  and Wellbutrin . - Consider psychiatric referral in the future. - Discuss mood stabilizers with neurologist if needed.  Type 2 diabetes mellitus with complications  A1c 6.2 indicates good control. Managed with diet and lifestyle. - Continue dietary management and monitor A1c.  Morbid obesity - BMI over 40  Struggles with weight management. Unable to use GLP-1 medications due to side effects and coverage issues. - Continue portion control and carbohydrate monitoring.  Hypertension associated with DM and obesity Blood pressure well-controlled at 126/84 without olmesartan . On metoprolol  for ventricular flutter. - Continue metoprolol . - Monitor blood pressure regularly.  Dyslipidemia associated with type 2 diabetes Allergic  to statins and discontinued Repatha  due to side effects. On ezetimibe . - Continue ezetimibe . - Check lipid panel.  Hypothyroidism TSH was off during recent hospitalization. Reports hair thinning and constipation, indicating possible thyroid  imbalance. - Check TSH and adjust levothyroxine  dose if needed.  Obstructive sleep apnea Struggles with CPAP compliance. Encouraged to resume use, especially with improved sleep from Seroquel . - Encourage gradual resumption of CPAP use, aiming for 4 hours per night.  Restless legs syndrome Requip  provides inconsistent relief. Advised to take nighttime medications earlier. - Continue Requip  2 mg. - Take nighttime medications at least an hour before bed.  Ventricular flutter No current palpitations. Metoprolol  effectively managing condition. - Continue metoprolol  25 mg.  Gastroesophageal reflux disease (GERD) Takes omeprazole  once daily, reduced post-gallbladder surgery. No need for nausea medication since surgery. - Continue omeprazole  once daily.  Constipation Ongoing constipation, possibly related to hypothyroidism. - Check thyroid  function and adjust treatment as needed.  Allergic rhinitis and mild intermittent asthma Uses Singulair  for asthma. Hospital advised discontinuation of inhalers unless needed. - Continue Singulair  as needed. - Use inhalers only if necessary.  Diaphoresis - seems to be related to activity, check TSH. Discussed to monitor if associated with sob or chest pain, may need to follow up with cardiologist

## 2023-11-04 ENCOUNTER — Ambulatory Visit: Payer: Self-pay | Admitting: Family Medicine

## 2023-11-04 LAB — CBC WITH DIFFERENTIAL/PLATELET
Absolute Lymphocytes: 876 {cells}/uL (ref 850–3900)
Absolute Monocytes: 336 {cells}/uL (ref 200–950)
Basophils Absolute: 42 {cells}/uL (ref 0–200)
Basophils Relative: 0.7 %
Eosinophils Absolute: 168 {cells}/uL (ref 15–500)
Eosinophils Relative: 2.8 %
HCT: 37.8 % (ref 35.0–45.0)
Hemoglobin: 12.3 g/dL (ref 11.7–15.5)
MCH: 31.6 pg (ref 27.0–33.0)
MCHC: 32.5 g/dL (ref 32.0–36.0)
MCV: 97.2 fL (ref 80.0–100.0)
MPV: 9.3 fL (ref 7.5–12.5)
Monocytes Relative: 5.6 %
Neutro Abs: 4578 {cells}/uL (ref 1500–7800)
Neutrophils Relative %: 76.3 %
Platelets: 275 Thousand/uL (ref 140–400)
RBC: 3.89 Million/uL (ref 3.80–5.10)
RDW: 14 % (ref 11.0–15.0)
Total Lymphocyte: 14.6 %
WBC: 6 Thousand/uL (ref 3.8–10.8)

## 2023-11-04 LAB — COMPREHENSIVE METABOLIC PANEL WITH GFR
AG Ratio: 1.5 (calc) (ref 1.0–2.5)
ALT: 18 U/L (ref 6–29)
AST: 19 U/L (ref 10–35)
Albumin: 4 g/dL (ref 3.6–5.1)
Alkaline phosphatase (APISO): 109 U/L (ref 37–153)
BUN/Creatinine Ratio: 23 (calc) — ABNORMAL HIGH (ref 6–22)
BUN: 28 mg/dL — ABNORMAL HIGH (ref 7–25)
CO2: 25 mmol/L (ref 20–32)
Calcium: 9.2 mg/dL (ref 8.6–10.4)
Chloride: 105 mmol/L (ref 98–110)
Creat: 1.21 mg/dL — ABNORMAL HIGH (ref 0.60–1.00)
Globulin: 2.6 g/dL (ref 1.9–3.7)
Glucose, Bld: 139 mg/dL — ABNORMAL HIGH (ref 65–99)
Potassium: 4.5 mmol/L (ref 3.5–5.3)
Sodium: 138 mmol/L (ref 135–146)
Total Bilirubin: 0.4 mg/dL (ref 0.2–1.2)
Total Protein: 6.6 g/dL (ref 6.1–8.1)
eGFR: 48 mL/min/1.73m2 — ABNORMAL LOW (ref >=60)

## 2023-11-04 LAB — TSH: TSH: 3.23 m[IU]/L (ref 0.40–4.50)

## 2023-11-04 LAB — B12 AND FOLATE PANEL
Folate: >24 ng/mL
Vitamin B-12: 372 pg/mL (ref 200–1100)

## 2023-11-04 LAB — LIPID PANEL
Cholesterol: 274 mg/dL — ABNORMAL HIGH (ref <200)
HDL: 43 mg/dL — ABNORMAL LOW (ref >=50)
LDL Cholesterol (Calc): 188 mg/dL — ABNORMAL HIGH
Non-HDL Cholesterol (Calc): 231 mg/dL — ABNORMAL HIGH (ref <130)
Total CHOL/HDL Ratio: 6.4 (calc) — ABNORMAL HIGH (ref <5.0)
Triglycerides: 230 mg/dL — ABNORMAL HIGH (ref <150)

## 2023-11-06 ENCOUNTER — Other Ambulatory Visit: Payer: Self-pay | Admitting: Family Medicine

## 2023-11-06 DIAGNOSIS — G2581 Restless legs syndrome: Secondary | ICD-10-CM

## 2023-11-18 DIAGNOSIS — M5416 Radiculopathy, lumbar region: Secondary | ICD-10-CM | POA: Diagnosis not present

## 2023-11-26 ENCOUNTER — Other Ambulatory Visit: Payer: Self-pay | Admitting: Family Medicine

## 2023-11-26 DIAGNOSIS — E1169 Type 2 diabetes mellitus with other specified complication: Secondary | ICD-10-CM

## 2023-11-28 ENCOUNTER — Other Ambulatory Visit: Payer: Self-pay | Admitting: Family Medicine

## 2023-11-28 DIAGNOSIS — J4520 Mild intermittent asthma, uncomplicated: Secondary | ICD-10-CM

## 2023-11-28 DIAGNOSIS — F330 Major depressive disorder, recurrent, mild: Secondary | ICD-10-CM

## 2023-11-29 DIAGNOSIS — M5416 Radiculopathy, lumbar region: Secondary | ICD-10-CM | POA: Diagnosis not present

## 2023-12-02 DIAGNOSIS — J029 Acute pharyngitis, unspecified: Secondary | ICD-10-CM | POA: Diagnosis not present

## 2023-12-02 DIAGNOSIS — J3489 Other specified disorders of nose and nasal sinuses: Secondary | ICD-10-CM | POA: Diagnosis not present

## 2023-12-02 DIAGNOSIS — M5416 Radiculopathy, lumbar region: Secondary | ICD-10-CM | POA: Diagnosis not present

## 2023-12-02 DIAGNOSIS — U071 COVID-19: Secondary | ICD-10-CM | POA: Diagnosis not present

## 2023-12-14 ENCOUNTER — Emergency Department
Admission: EM | Admit: 2023-12-14 | Discharge: 2023-12-15 | Disposition: A | Discharge status: Home / Self Care | Source: Ambulatory Visit | Attending: Emergency Medicine | Admitting: Emergency Medicine

## 2023-12-14 ENCOUNTER — Other Ambulatory Visit: Payer: Self-pay

## 2023-12-14 ENCOUNTER — Encounter: Payer: Self-pay | Admitting: Emergency Medicine

## 2023-12-14 ENCOUNTER — Encounter: Payer: Self-pay | Admitting: Family Medicine

## 2023-12-14 ENCOUNTER — Emergency Department

## 2023-12-14 DIAGNOSIS — R0789 Other chest pain: Secondary | ICD-10-CM | POA: Insufficient documentation

## 2023-12-14 DIAGNOSIS — I700 Atherosclerosis of aorta: Secondary | ICD-10-CM | POA: Diagnosis not present

## 2023-12-14 DIAGNOSIS — R079 Chest pain, unspecified: Secondary | ICD-10-CM | POA: Diagnosis not present

## 2023-12-14 DIAGNOSIS — N189 Chronic kidney disease, unspecified: Secondary | ICD-10-CM | POA: Insufficient documentation

## 2023-12-14 DIAGNOSIS — I129 Hypertensive chronic kidney disease with stage 1 through stage 4 chronic kidney disease, or unspecified chronic kidney disease: Secondary | ICD-10-CM | POA: Diagnosis not present

## 2023-12-14 LAB — CBC
HCT: 35.9 % — ABNORMAL LOW (ref 36.0–46.0)
Hemoglobin: 12 g/dL (ref 12.0–15.0)
MCH: 31.7 pg (ref 26.0–34.0)
MCHC: 33.4 g/dL (ref 30.0–36.0)
MCV: 94.7 fL (ref 80.0–100.0)
Platelets: 257 K/uL (ref 150–400)
RBC: 3.79 MIL/uL — ABNORMAL LOW (ref 3.87–5.11)
RDW: 13 % (ref 11.5–15.5)
WBC: 6.2 K/uL (ref 4.0–10.5)
nRBC: 0 % (ref 0.0–0.2)

## 2023-12-14 LAB — BASIC METABOLIC PANEL WITH GFR
Anion gap: 9 (ref 5–15)
BUN: 25 mg/dL — ABNORMAL HIGH (ref 8–23)
CO2: 25 mmol/L (ref 22–32)
Calcium: 8.8 mg/dL — ABNORMAL LOW (ref 8.9–10.3)
Chloride: 102 mmol/L (ref 98–111)
Creatinine, Ser: 1.13 mg/dL — ABNORMAL HIGH (ref 0.44–1.00)
GFR, Estimated: 52 mL/min — ABNORMAL LOW (ref >60)
Glucose, Bld: 184 mg/dL — ABNORMAL HIGH (ref 70–99)
Potassium: 4.1 mmol/L (ref 3.5–5.1)
Sodium: 136 mmol/L (ref 135–145)

## 2023-12-14 LAB — TROPONIN I (HIGH SENSITIVITY)
Troponin I (High Sensitivity): 5 ng/L (ref <18)
Troponin I (High Sensitivity): 4 ng/L (ref <18)

## 2023-12-14 NOTE — ED Provider Notes (Signed)
 [facility] Provider Note    Event Date/Time   First MD Initiated Contact with Patient 12/14/23 2226     (approximate)   History   Chest Pain   HPI  Sarah Gordon is a 73 y.o. female patient has a past medical history significant for CKD, recent COVID infection, hypertension, who presents to the emergency department with an episode of not feeling well.  States that she had COVID last week and she has not been feeling well since that time.  States that she has been very tired and fatigued.  Complaining of generalized weakness and just not feeling back to her normal.  States that she had an episode just prior to arrival where she felt like there was a warm sensation like IV contrast going through her entire body and felt off.  States that she called her primary care physician and was told to come into the emergency department for further evaluation.  Denies any ongoing chest pain or shortness of breath.  Denies any pleuritic chest pain.  Denies nausea, vomiting or diarrhea.  No dysuria, urinary urgency or frequency.  No changes to her home medications.  No history of DVT or PE.     Physical Exam   Triage Vital Signs: ED Triage Vitals [12/14/23 1802]  Encounter Vitals Group     BP (!) 150/82     Girls Systolic BP Percentile      Girls Diastolic BP Percentile      Boys Systolic BP Percentile      Boys Diastolic BP Percentile      Pulse Rate 92     Resp 17     Temp 98.2 F (36.8 C)     Temp Source Oral     SpO2 98 %     Weight 280 lb (127 kg)     Height 5' 6 (1.676 m)     Head Circumference      Peak Flow      Pain Score 3     Pain Loc      Pain Education      Exclude from Growth Chart     Most recent vital signs: Vitals:   12/14/23 1802 12/14/23 2103  BP: (!) 150/82 (!) 151/86  Pulse: 92 80  Resp: 17 17  Temp: 98.2 F (36.8 C) 97.7 F (36.5 C)  SpO2: 98% 97%    Physical Exam Constitutional:      Appearance: She is well-developed. She  is obese.  HENT:     Head: Atraumatic.  Eyes:     Conjunctiva/sclera: Conjunctivae normal.  Cardiovascular:     Rate and Rhythm: Regular rhythm.  Pulmonary:     Effort: Pulmonary effort is normal. No respiratory distress.  Abdominal:     General: There is no distension.     Palpations: Abdomen is soft.     Tenderness: There is no abdominal tenderness.  Musculoskeletal:        General: Normal range of motion.     Cervical back: Normal range of motion.     Right lower leg: No edema.     Left lower leg: No edema.     Comments: No unilateral leg swelling  Skin:    General: Skin is warm.     Capillary Refill: Capillary refill takes less than 2 seconds.  Neurological:     General: No focal deficit present.     Mental Status: She is alert. Mental status is at baseline.  IMPRESSION / MDM / ASSESSMENT AND PLAN / ED COURSE  I reviewed the triage vital signs and the nursing notes.  Differential diagnosis including pneumonia, anemia, ACS, pneumothorax, pulmonary embolism, ongoing symptoms of COVID, dehydration, electrolyte abnormality  EKG  I, Clotilda Punter, the attending physician, personally viewed and interpreted this ECG.   Rate: Normal  Rhythm: Normal sinus  Axis: Normal  Intervals: Normal  ST&T Change: None  No tachycardic or bradycardic dysrhythmias while on cardiac telemetry.  RADIOLOGY I independently reviewed imaging, my interpretation of imaging: Chest x-ray with no signs of pneumonia  LABS (all labs ordered are listed, but only abnormal results are displayed) Labs interpreted as -    Labs Reviewed  BASIC METABOLIC PANEL WITH GFR - Abnormal; Notable for the following components:      Result Value   Glucose, Bld 184 (*)    BUN 25 (*)    Creatinine, Ser 1.13 (*)    Calcium  8.8 (*)    GFR, Estimated 52 (*)    All other components within normal limits  CBC - Abnormal; Notable for the following components:   RBC 3.79 (*)    HCT 35.9 (*)    All other  components within normal limits  TROPONIN I (HIGH SENSITIVITY)  TROPONIN I (HIGH SENSITIVITY)     MDM  Patient presents to the emergency department for not feeling well with an episode of chest pain.  Currently chest pain-free.  On arrival afebrile and hemodynamically stable.  Lab work with no significant leukocytosis or anemia.  Creatinine is at her baseline with no significant electrolyte abnormality.  Serial troponins are negative.  Discussed CTA to further evaluate for possible pulmonary embolism or pneumonia that is not showing up on her chest x-ray, patient states that she wants to defer CTA at this time and will follow-up as an outpatient with her primary care physician.  No obvious findings of pneumonia clinically.  Most likely with having ongoing symptoms from her recent COVID infection.  I have a low suspicion for pericarditis, no rub or positional changes and no findings on EKG.  I do have a low suspicion for pulmonary embolism given that she does not have any ongoing chest pain or shortness of breath and no findings of DVT on exam.  Discussed close follow-up with primary care physician and discussed return precautions for any ongoing or worsening symptoms.  Patient expressed understanding.  No questions at time of discharge.     PROCEDURES:  Critical Care performed: No  Procedures  Patient's presentation is most consistent with acute presentation with potential threat to life or bodily function.   MEDICATIONS ORDERED IN ED: Medications - No data to display  FINAL CLINICAL IMPRESSION(S) / ED DIAGNOSES   Final diagnoses:  Atypical chest pain     Rx / DC Orders   ED Discharge Orders     None        Note:  This document was prepared using Dragon voice recognition software and may include unintentional dictation errors.   Punter Clotilda, MD 12/17/23 1446

## 2023-12-14 NOTE — ED Triage Notes (Signed)
 Patient to ED via POV for chest tightness. Pt reports testing positive for COVID on 9/12, neg on 9/19. Reports fatigue, headaches, hot flashes and an episode of CP earlier today. Currently has chest tightness. Hx: HTN

## 2023-12-14 NOTE — Discharge Instructions (Signed)
 You are seen in the emergency department for not feeling well with an episode of chest pain and shortness of breath.  Your chest x-ray did not show any signs of pneumonia.  Your vital signs were stable and normal in the emergency department.  Your heart enzymes (troponin) were normal, do not believe that you are having myocarditis or a heart attack today.  We discussed ruling out a blood clot however since you did not have chest pain or shortness of breath you wanted to follow-up with your primary care physician and return if your symptoms worsen.

## 2023-12-16 ENCOUNTER — Other Ambulatory Visit: Payer: Self-pay | Admitting: Family Medicine

## 2023-12-16 ENCOUNTER — Ambulatory Visit: Admitting: Family Medicine

## 2023-12-16 ENCOUNTER — Encounter: Payer: Self-pay | Admitting: Family Medicine

## 2023-12-16 VITALS — BP 122/78 | HR 99 | Resp 16 | Ht 66.0 in | Wt 282.6 lb

## 2023-12-16 DIAGNOSIS — Z8616 Personal history of COVID-19: Secondary | ICD-10-CM

## 2023-12-16 DIAGNOSIS — F330 Major depressive disorder, recurrent, mild: Secondary | ICD-10-CM | POA: Diagnosis not present

## 2023-12-16 DIAGNOSIS — N1831 Chronic kidney disease, stage 3a: Secondary | ICD-10-CM | POA: Diagnosis not present

## 2023-12-16 DIAGNOSIS — M533 Sacrococcygeal disorders, not elsewhere classified: Secondary | ICD-10-CM | POA: Diagnosis not present

## 2023-12-16 DIAGNOSIS — K219 Gastro-esophageal reflux disease without esophagitis: Secondary | ICD-10-CM

## 2023-12-16 DIAGNOSIS — E1122 Type 2 diabetes mellitus with diabetic chronic kidney disease: Secondary | ICD-10-CM

## 2023-12-16 DIAGNOSIS — Z23 Encounter for immunization: Secondary | ICD-10-CM

## 2023-12-16 NOTE — Progress Notes (Signed)
 Name: Sarah Gordon   MRN: [id number]    DOB: June 04, 1950   Date:12/16/2023       Progress Note  Subjective  Chief Complaint  Chief Complaint  Patient presents with   Chest Pain    Still has some tightness   Hot Flashes    Continues having them getting worst   Fall    Pt fell and states her tail bone is hurting   Discussed the use of AI scribe software for clinical note transcription with the patient, who gave verbal consent to proceed.  History of Present Illness Sarah Gordon is a 73 year old female with chronic kidney disease stage 3A who presents with persistent fatigue, headaches, and depression following a recent COVID-19 infection.  She has been experiencing persistent fatigue and headaches following a COVID-19 infection diagnosed on December 02, 2023. Initially, she had cough and sinus congestion and tested positive for COVID-19 at a [facility]. Despite subsequent negative COVID-19 tests, she continues to experience significant fatigue and worsening headaches.  She describes an episode of a burning sensation under her skin that started on her feet and traveled up to her head, accompanied by chest pain and nausea. This episode lasted about ten minutes. A chest x-ray and blood work, including cardiac enzymes, were performed on December 14, 2023 at [facility]. Her blood sugar was 184.  She has a history of chronic kidney disease stage 3A. She also reports a history of depression, which has worsened recently. She is experiencing increased feelings of depression, lack of interest, and difficulty concentrating. No suicidal thoughts, but she finds her depression very difficult to manage. She is currently taking quetiapine , duloxetine , and Wellbutrin  for her depression.  Her family history is significant for dementia and Alzheimer's disease. Her father had dementia likely triggered by alcoholism. She has multiple sisters with dementia-related conditions: one with Alzheimer's, one  with dementia from traumatic head injuries, and another who recently passed away with dementia. This family history contributes to her anxiety about her own health.  Socially, she is experiencing a transition with her job at [employer], which has been emotionally challenging. She has been the coffee time coordinator for ten years but has recently decided to step down due to the increasing burden on her husband. This change, along with her family history and recent health issues, has contributed to her current emotional state.    Patient Active Problem List   Diagnosis Date Noted   Near syncope 09/04/2023   Calculus of gallbladder with acute cholecystitis without obstruction 05/24/2023   Background diabetic retinopathy (HCC) 01/03/2023   Varicose veins with inflammation 07/16/2022   Cognitive complaints 10/27/2021   Polyp of colon    Lumbar radiculopathy 11/06/2020   Ventricular flutter (HCC) 12/06/2018   MDD (major depressive disorder), recurrent episode, mild 09/04/2018   DDD (degenerative disc disease), cervical 01/23/2016   Degenerative joint disease of left acromioclavicular joint 01/23/2016   Recurrent oral herpes simplex infection 09/01/2015   Irritable bowel syndrome with diarrhea 08/07/2015   Statin intolerance 02/28/2015   Asthma, intermittent 09/10/2014   Controlled type 2 diabetes with neuropathy (HCC) 09/10/2014   Fibromyalgia 09/10/2014   Obstructive sleep apnea 09/10/2014   Hyperlipidemia 12/29/2012   Essential hypertension 12/29/2012   Morbid obesity (HCC) 12/29/2012   Arthritis due to gout 12/13/2007   Acid reflux 10/24/2006   Adult hypothyroidism 10/24/2006   Localized osteoarthrosis 10/24/2006    Past Surgical History:  Procedure Laterality Date   BREAST BIOPSY  Left    benign   CATARACT EXTRACTION W/PHACO Left 01/18/2023   Procedure: CATARACT EXTRACTION PHACO AND INTRAOCULAR LENS PLACEMENT (IOC) LEFT DIABETIC  CLAREON VIVITY TORIC LENS;  Surgeon: Jaye Fallow, MD;  Location: [facility];  Service: Ophthalmology;  Laterality: Left;  4.47 0:31.9   CATARACT EXTRACTION W/PHACO Right 02/01/2023   Procedure: CATARACT EXTRACTION PHACO AND INTRAOCULAR LENS PLACEMENT (IOC) RIGHT DIABETIC  CLAREON VIVITY TORIC LENS 3.68 00:28.9;  Surgeon: Jaye Fallow, MD;  Location: [facility];  Service: Ophthalmology;  Laterality: Right;   CHOLECYSTECTOMY     COLONOSCOPY     Dr Desiderio   COLONOSCOPY WITH PROPOFOL  N/A 11/19/2015   Procedure: COLONOSCOPY WITH PROPOFOL ;  Surgeon: Louanne KANDICE Muse, MD;  Location: [facility];  Service: Endoscopy;  Laterality: N/A;   COLONOSCOPY WITH PROPOFOL  N/A 06/18/2021   Procedure: COLONOSCOPY WITH PROPOFOL ;  Surgeon: Unk Corinn Skiff, MD;  Location: [facility];  Service: Gastroenterology;  Laterality: N/A;   ESOPHAGOGASTRODUODENOSCOPY N/A 08/20/2014   Procedure: ESOPHAGOGASTRODUODENOSCOPY (EGD);  Surgeon: Louanne KANDICE Muse, MD;  Location: [facility];  Service: Endoscopy;  Laterality: N/A;   ESOPHAGOGASTRODUODENOSCOPY (EGD) WITH PROPOFOL  N/A 06/18/2021   Procedure: ESOPHAGOGASTRODUODENOSCOPY (EGD) WITH PROPOFOL ;  Surgeon: Unk Corinn Skiff, MD;  Location: [facility];  Service: Gastroenterology;  Laterality: N/A;   EYE SURGERY     Cataract removal   FOOT SURGERY Left    heel spur   HERNIA REPAIR     umbilical   NASAL SINUS SURGERY  1980s   UMBILICAL HERNIA REPAIR      Family History  Problem Relation Age of Onset   Hypertension Mother    Hyperlipidemia Mother    Diabetes Mother    Heart disease Mother    Stroke Mother    Varicose Veins Mother    Obesity Mother    Alcohol abuse Father    Lupus Sister    Arthritis Sister    Depression Sister    Obesity Sister    Dementia Sister 34       Alzheimer's dementia   Asthma Sister    Depression Sister    Hypertension Sister    Miscarriages / Stillbirths Sister    Varicose Veins Sister    Depression Sister    Arthritis  Sister    Heart disease Sister    Hyperlipidemia Sister    Hypertension Sister    Miscarriages / Stillbirths Sister    Varicose Veins Sister    Dementia Sister    Dementia Sister        due to head trauma   Miscarriages / Stillbirths Sister    Arthritis Sister    Depression Sister    Heart disease Sister    Hyperlipidemia Sister    Hypertension Sister    Stroke Sister    Heart disease Brother 39       CABG x 3    Heart attack Brother 98   Hyperlipidemia Brother    Hypertension Brother    Diabetes Brother    Hypertension Brother    Obesity Brother    Asthma Brother    Depression Brother    Hyperlipidemia Brother    Alcohol abuse Brother    Heart disease Brother    Stroke Brother    Varicose Veins Brother    Early death Brother    Stroke Brother    Arthritis Brother    Depression Brother    Hyperlipidemia Brother    Hypertension Brother    Stroke Maternal Grandfather  Breast cancer Neg Hx     Social History   Tobacco Use   Smoking status: Never    Passive exposure: Never   Smokeless tobacco: Never  Substance Use Topics   Alcohol use: No     Current Outpatient Medications:    acetaminophen  (TYLENOL ) 500 MG tablet, Take 1,000 mg by mouth every 6 (six) hours as needed for moderate pain (pain score 4-6)., Disp: , Rfl:    buPROPion  (WELLBUTRIN  XL) 150 MG 24 hr tablet, Take 1 tablet (150 mg total) by mouth every morning., Disp: 90 tablet, Rfl: 1   DULoxetine  (CYMBALTA ) 60 MG capsule, Take 1 capsule (60 mg total) by mouth daily., Disp: 90 capsule, Rfl: 1   ezetimibe  (ZETIA ) 10 MG tablet, TAKE 1 TABLET BY MOUTH DAILY, Disp: 90 tablet, Rfl: 0   folic acid  (FOLVITE ) 800 MCG tablet, Take 800 mcg by mouth daily., Disp: , Rfl:    hydrOXYzine  (ATARAX ) 10 MG tablet, Take 1 tablet (10 mg total) by mouth 3 (three) times daily as needed for anxiety., Disp: 30 tablet, Rfl: 0   levothyroxine  (SYNTHROID ) 100 MCG tablet, TAKE 1 TABLET BY MOUTH DAILY, Disp: 90 tablet, Rfl: 1    metoprolol  succinate (TOPROL -XL) 25 MG 24 hr tablet, Take 1 tablet (25 mg total) by mouth daily., Disp: 90 tablet, Rfl: 1   montelukast  (SINGULAIR ) 10 MG tablet, Take 1 tablet (10 mg total) by mouth at bedtime., Disp: 90 tablet, Rfl: 1   Multiple Vitamin (MULTIVITAMIN) tablet, Take 1 tablet by mouth daily., Disp: , Rfl:    omeprazole  (PRILOSEC) 20 MG capsule, TAKE 1 CAPSULE BY MOUTH 2 TIMES A DAY BEFORE A MEAL, Disp: 180 capsule, Rfl: 0   ondansetron  (ZOFRAN ) 4 MG tablet, Take 1 tablet (4 mg total) by mouth every 8 (eight) hours as needed for nausea or vomiting., Disp: 20 tablet, Rfl: 0   Polyethyl Glycol-Propyl Glycol (SYSTANE OP), Place 1 drop into both eyes daily as needed (dry eyes)., Disp: , Rfl:    QUEtiapine  (SEROQUEL ) 50 MG tablet, Take 50 mg by mouth at bedtime., Disp: , Rfl:    rOPINIRole  (REQUIP ) 2 MG tablet, TAKE 1 TABLET BY MOUTH AT BEDTIME, Disp: 90 tablet, Rfl: 1   tiZANidine  (ZANAFLEX ) 4 MG tablet, TAKE 1 TABLET BY MOUTH EVERY NIGHT AT BEDTIME AS NEEDED FOR MUSCLE SPASMS, Disp: 90 tablet, Rfl: 1  Allergies  Allergen Reactions   Vascepa [Icosapent Ethyl (Epa Ethyl Ester) (Fish)] Hives and Swelling   Aspir-81 [Aspirin] Other (See Comments)    Coagulation disorder, causes bruising    Gabapentin  Other (See Comments)    Cognitive/memory issues   Metformin And Related     indigestion   Mounjaro  [Tirzepatide ] Diarrhea    Epigastric pain    Peanut-Containing Drug Products     Tongue goes numb from peanuts   Statins Other (See Comments)    Joint pain   Topamax [Topiramate] Other (See Comments)    Cognitive issues    Voltaren [Diclofenac Sodium] Hives and Other (See Comments)   Lipitor [Atorvastatin]     Joint pain    I personally reviewed active problem list, medication list, allergies with the patient/caregiver today.   ROS  Ten systems reviewed and is negative except as mentioned in HPI    Objective Physical Exam  CONSTITUTIONAL: Patient appears well-developed and  well-nourished.  No distress. HEENT: Head atraumatic, normocephalic, neck supple. CARDIOVASCULAR: Normal rate, regular rhythm and normal heart sounds.  No murmur heard. No BLE edema. PULMONARY: Effort normal  and breath sounds normal. No respiratory distress. ABDOMINAL: There is no tenderness or distention. MUSCULOSKELETAL:slow gait, using a walker PSYCHIATRIC: Patient has a normal mood and affect. behavior is normal. Judgment and thought content normal.  Vitals:   12/16/23 0953 12/16/23 0955 12/16/23 1041  BP: 122/78    Pulse: (!) 114 (!) 102 99  Resp: 16    SpO2: 99%    Weight: 282 lb 9.6 oz (128.2 kg)    Height: 5' 6 (1.676 m)      Body mass index is 45.61 kg/m.  Recent Results (from the past 2160 hours)  Lab report - scanned     Status: None   Collection Time: 10/18/23 12:00 AM  Result Value Ref Range   Microalb Creat Ratio 5     Comment: Abstracted by HIM   Albumin , Urine POC 0.9     Comment: Abstracted by HIM   Microalb Creat Ratio 177     Comment: Abstracted by HIM   Creatinine, POC 1.09 mg/dL    Comment: Abstracted by HIM   EGFR 55.0     Comment: Abstracted by HIM  POCT glycosylated hemoglobin (Hb A1C)     Status: Abnormal   Collection Time: 11/03/23  8:42 AM  Result Value Ref Range   Hemoglobin A1C 6.2 (A) 4.0 - 5.6 %   HbA1c POC (<> result, manual entry)     HbA1c, POC (prediabetic range)     HbA1c, POC (controlled diabetic range)    Lipid panel     Status: Abnormal   Collection Time: 11/03/23  9:27 AM  Result Value Ref Range   Cholesterol 274 (H) <200 mg/dL   HDL 43 (L) > OR = 50 mg/dL   Triglycerides 769 (H) <150 mg/dL    Comment: . If a non-fasting specimen was collected, consider repeat triglyceride testing on a fasting specimen if clinically indicated.  Veatrice et al. J. of Clin. Lipidol. 2015;9:129-169. SABRA    LDL Cholesterol (Calc) 188 (H) mg/dL (calc)    Comment: Reference range: <100 . Desirable range <100 mg/dL for primary prevention;    <70 mg/dL for patients with CHD or diabetic patients  with > or = 2 CHD risk factors. SABRA LDL-C is now calculated using the Martin-Hopkins  calculation, which is a validated novel method providing  better accuracy than the Friedewald equation in the  estimation of LDL-C.  Gladis APPLETHWAITE et al. SANDREA. 7986;689(80): 2061-2068  (http://education.[facility].com/faq/FAQ164)    Total CHOL/HDL Ratio 6.4 (H) <5.0 (calc)   Non-HDL Cholesterol (Calc) 231 (H) <130 mg/dL (calc)    Comment: Non-HDL level > or = 220 is very high and may indicate  genetic familial hypercholesterolemia (FH). Clinical  assessment and measurement of blood lipid levels  should be considered for all first-degree relatives  of patients with an FH diagnosis. . For patients with diabetes plus 1 major ASCVD risk  factor, treating to a non-HDL-C goal of <100 mg/dL  (LDL-C of <29 mg/dL) is considered a therapeutic  option.   CBC with Differential/Platelet     Status: None   Collection Time: 11/03/23  9:27 AM  Result Value Ref Range   WBC 6.0 3.8 - 10.8 Thousand/uL   RBC 3.89 3.80 - 5.10 Million/uL   Hemoglobin 12.3 11.7 - 15.5 g/dL   HCT 62.1 64.9 - 54.9 %   MCV 97.2 80.0 - 100.0 fL   MCH 31.6 27.0 - 33.0 pg   MCHC 32.5 32.0 - 36.0 g/dL    Comment: For adults,  a slight decrease in the calculated MCHC value (in the range of 30 to 32 g/dL) is most likely not clinically significant; however, it should be interpreted with caution in correlation with other red cell parameters and the patient's clinical condition.    RDW 14.0 11.0 - 15.0 %   Platelets 275 140 - 400 Thousand/uL   MPV 9.3 7.5 - 12.5 fL   Neutro Abs 4,578 1,500 - 7,800 cells/uL   Absolute Lymphocytes 876 850 - 3,900 cells/uL   Absolute Monocytes 336 200 - 950 cells/uL   Eosinophils Absolute 168 15 - 500 cells/uL   Basophils Absolute 42 0 - 200 cells/uL   Neutrophils Relative % 76.3 %   Total Lymphocyte 14.6 %   Monocytes Relative 5.6 %   Eosinophils  Relative 2.8 %   Basophils Relative 0.7 %  Comprehensive metabolic panel with GFR     Status: Abnormal   Collection Time: 11/03/23  9:27 AM  Result Value Ref Range   Glucose, Bld 139 (H) 65 - 99 mg/dL    Comment: .            Fasting reference interval . For someone without known diabetes, a glucose value >125 mg/dL indicates that they may have diabetes and this should be confirmed with a follow-up test. .    BUN 28 (H) 7 - 25 mg/dL   Creat 8.78 (H) 9.39 - 1.00 mg/dL   eGFR 48 (L) > OR = 60 mL/min/1.60m2   BUN/Creatinine Ratio 23 (H) 6 - 22 (calc)   Sodium 138 135 - 146 mmol/L   Potassium 4.5 3.5 - 5.3 mmol/L   Chloride 105 98 - 110 mmol/L   CO2 25 20 - 32 mmol/L   Calcium  9.2 8.6 - 10.4 mg/dL   Total Protein 6.6 6.1 - 8.1 g/dL   Albumin  4.0 3.6 - 5.1 g/dL   Globulin 2.6 1.9 - 3.7 g/dL (calc)   AG Ratio 1.5 1.0 - 2.5 (calc)   Total Bilirubin 0.4 0.2 - 1.2 mg/dL   Alkaline phosphatase (APISO) 109 37 - 153 U/L   AST 19 10 - 35 U/L   ALT 18 6 - 29 U/L  B12 and Folate Panel     Status: None   Collection Time: 11/03/23  9:27 AM  Result Value Ref Range   Vitamin B-12 372 200 - 1,100 pg/mL    Comment: . Please Note: Although the reference range for vitamin B12 is 828 708 1271 pg/mL, it has been reported that between 5 and 10% of patients with values between 200 and 400 pg/mL may experience neuropsychiatric and hematologic abnormalities due to occult B12 deficiency; less than 1% of patients with values above 400 pg/mL will have symptoms. .    Folate >24.0 ng/mL    Comment:                            Reference Range                            Low:           <3.4                            Borderline:    3.4-5.4  Normal:        >5.4 .   TSH     Status: None   Collection Time: 11/03/23  9:27 AM  Result Value Ref Range   TSH 3.23 0.40 - 4.50 mIU/L  Basic metabolic panel     Status: Abnormal   Collection Time: 12/14/23  6:04 PM  Result Value Ref  Range   Sodium 136 135 - 145 mmol/L   Potassium 4.1 3.5 - 5.1 mmol/L   Chloride 102 98 - 111 mmol/L   CO2 25 22 - 32 mmol/L   Glucose, Bld 184 (H) 70 - 99 mg/dL    Comment: Glucose reference range applies only to samples taken after fasting for at least 8 hours.   BUN 25 (H) 8 - 23 mg/dL   Creatinine, Ser 8.86 (H) 0.44 - 1.00 mg/dL   Calcium  8.8 (L) 8.9 - 10.3 mg/dL   GFR, Estimated 52 (L) >60 mL/min    Comment: (NOTE) Calculated using the CKD-EPI Creatinine Equation (2021)    Anion gap 9 5 - 15    Comment: Performed at [facility], [address] [place], [place] [address]  CBC     Status: Abnormal   Collection Time: 12/14/23  6:04 PM  Result Value Ref Range   WBC 6.2 4.0 - 10.5 K/uL   RBC 3.79 (L) 3.87 - 5.11 MIL/uL   Hemoglobin 12.0 12.0 - 15.0 g/dL   HCT 64.0 (L) 63.9 - 53.9 %   MCV 94.7 80.0 - 100.0 fL   MCH 31.7 26.0 - 34.0 pg   MCHC 33.4 30.0 - 36.0 g/dL   RDW 86.9 88.4 - 84.4 %   Platelets 257 150 - 400 K/uL   nRBC 0.0 0.0 - 0.2 %    Comment: Performed at [facility], [address] [place], [place] [address]  Troponin I (High Sensitivity)     Status: None   Collection Time: 12/14/23  6:04 PM  Result Value Ref Range   Troponin I (High Sensitivity) 5 <18 ng/L    Comment: (NOTE) Elevated high sensitivity troponin I (hsTnI) values and significant  changes across serial measurements may suggest ACS but many other  chronic and acute conditions are known to elevate hsTnI results.  Refer to the Links section for chest pain algorithms and additional  guidance. Performed at [facility], [address] [place], [place] [address]   Troponin I (High Sensitivity)     Status: None   Collection Time: 12/14/23  9:03 PM  Result Value Ref Range   Troponin I (High Sensitivity) 4 <18 ng/L    Comment: (NOTE) Elevated high sensitivity troponin I (hsTnI) values and significant  changes across serial measurements may suggest ACS but many other   chronic and acute conditions are known to elevate hsTnI results.  Refer to the Links section for chest pain algorithms and additional  guidance. Performed at [facility], [address] [place], [place] [address]     Diabetic Foot Exam:     PHQ2/9:    12/16/2023    9:55 AM 11/03/2023    8:36 AM 06/16/2023   10:18 AM 05/09/2023    8:47 AM 03/31/2023    2:52 PM  Depression screen PHQ 2/9  Decreased Interest 2 1 0 1 0  Down, Depressed, Hopeless 2 1 0 1 0  PHQ - 2 Score 4 2 0 2 0  Altered sleeping 0 1 0 1 0  Tired, decreased energy 3 1 0  1 0  Change in appetite 2 1 0 0 0  Feeling bad or failure about yourself  0 0 0 0 0  Trouble concentrating 2 1 0 1 0  Moving slowly or fidgety/restless 0 0 0 0 0  Suicidal thoughts 0 0 0 0 0  PHQ-9 Score 11 6 0 5 0  Difficult doing work/chores Very difficult Somewhat difficult Not difficult at all Somewhat difficult Not difficult at all    phq 9 is positive  Fall Risk:    12/16/2023    9:45 AM 11/03/2023    8:30 AM 06/16/2023   10:12 AM 05/23/2023    9:53 AM 05/09/2023    8:46 AM  Fall Risk   Falls in the past year? 1 0 1 0 1  Number falls in past yr: 0 0 0 0 0  Injury with Fall? 1 0 0 0 0  Risk for fall due to : Impaired balance/gait No Fall Risks Impaired balance/gait  Impaired balance/gait  Follow up Falls evaluation completed Falls evaluation completed Falls evaluation completed  Falls prevention discussed;Education provided;Falls evaluation completed    Assessment & Plan Major depressive disorder, recurrent episode with anxiety symptoms Recurrent depression with anxiety exacerbated by stressors. Symptoms include anhedonia, fatigue, hyperphagia, and concentration issues. Anxiety may have triggered recent chest pain, likely a panic attack. Current medications: quetiapine , duloxetine , Wellbutrin . No prior psychiatric consultation. Emphasized addressing mental health to prevent worsening with ongoing stress. - Refer to  psychiatrist for medication management and therapy, preferably Dr. Kapoor if insurance allows. - Encourage therapy engagement, either through psychiatrist or independently. - Advise lifestyle modifications: increase sunlight exposure, social interactions, and physical activity. - Monitor for chest pain or panic attack symptoms; advise documentation and emergency care if episodes exceed 10 minutes.  Fatigue and headache following recent COVID-19 infection Post-COVID fatigue and headache. Symptoms include extreme tiredness and headaches. Positive COVID-19 test on September 12. Symptoms may be exacerbated by depression and anxiety. Too early for long COVID diagnosis. Expected resolution over time. - Monitor symptoms and document any new or worsening symptoms. - Advise lifestyle modifications to manage fatigue, including rest and gradual increase in activity.  Chronic kidney disease stage 3A CKD stage 3A with improved kidney function. Current GFR is 52, improved from 23 in June.  General Health Maintenance Discussed importance of maintaining health through vaccinations. - Administer high-dose flu vaccine.

## 2023-12-25 ENCOUNTER — Other Ambulatory Visit: Payer: Self-pay | Admitting: Family Medicine

## 2023-12-25 DIAGNOSIS — E039 Hypothyroidism, unspecified: Secondary | ICD-10-CM

## 2023-12-28 DIAGNOSIS — G2581 Restless legs syndrome: Secondary | ICD-10-CM | POA: Diagnosis not present

## 2023-12-28 DIAGNOSIS — G479 Sleep disorder, unspecified: Secondary | ICD-10-CM | POA: Diagnosis not present

## 2023-12-28 DIAGNOSIS — Z820 Family history of epilepsy and other diseases of the nervous system: Secondary | ICD-10-CM | POA: Diagnosis not present

## 2023-12-28 DIAGNOSIS — G4733 Obstructive sleep apnea (adult) (pediatric): Secondary | ICD-10-CM | POA: Diagnosis not present

## 2023-12-28 DIAGNOSIS — Z818 Family history of other mental and behavioral disorders: Secondary | ICD-10-CM | POA: Diagnosis not present

## 2023-12-28 DIAGNOSIS — G3184 Mild cognitive impairment, so stated: Secondary | ICD-10-CM | POA: Diagnosis not present

## 2023-12-29 DIAGNOSIS — M25561 Pain in right knee: Secondary | ICD-10-CM | POA: Diagnosis not present

## 2023-12-29 DIAGNOSIS — M1711 Unilateral primary osteoarthritis, right knee: Secondary | ICD-10-CM | POA: Diagnosis not present

## 2023-12-30 DIAGNOSIS — M1711 Unilateral primary osteoarthritis, right knee: Secondary | ICD-10-CM | POA: Diagnosis not present

## 2024-01-04 DIAGNOSIS — M5416 Radiculopathy, lumbar region: Secondary | ICD-10-CM | POA: Diagnosis not present

## 2024-01-06 ENCOUNTER — Other Ambulatory Visit: Payer: Self-pay

## 2024-01-06 ENCOUNTER — Emergency Department

## 2024-01-06 ENCOUNTER — Emergency Department
Admission: EM | Admit: 2024-01-06 | Discharge: 2024-01-06 | Disposition: A | Discharge status: Home / Self Care | Source: Ambulatory Visit | Attending: Emergency Medicine | Admitting: Emergency Medicine

## 2024-01-06 DIAGNOSIS — R079 Chest pain, unspecified: Secondary | ICD-10-CM | POA: Diagnosis not present

## 2024-01-06 DIAGNOSIS — I10 Essential (primary) hypertension: Secondary | ICD-10-CM | POA: Diagnosis not present

## 2024-01-06 DIAGNOSIS — E119 Type 2 diabetes mellitus without complications: Secondary | ICD-10-CM | POA: Insufficient documentation

## 2024-01-06 DIAGNOSIS — R0789 Other chest pain: Secondary | ICD-10-CM | POA: Insufficient documentation

## 2024-01-06 LAB — BASIC METABOLIC PANEL WITH GFR
Anion gap: 11 (ref 5–15)
BUN: 33 mg/dL — ABNORMAL HIGH (ref 8–23)
CO2: 22 mmol/L (ref 22–32)
Calcium: 8.8 mg/dL — ABNORMAL LOW (ref 8.9–10.3)
Chloride: 107 mmol/L (ref 98–111)
Creatinine, Ser: 1.18 mg/dL — ABNORMAL HIGH (ref 0.44–1.00)
GFR, Estimated: 50 mL/min — ABNORMAL LOW (ref >60)
Glucose, Bld: 235 mg/dL — ABNORMAL HIGH (ref 70–99)
Potassium: 3.8 mmol/L (ref 3.5–5.1)
Sodium: 140 mmol/L (ref 135–145)

## 2024-01-06 LAB — CBC
HCT: 36.6 % (ref 36.0–46.0)
Hemoglobin: 12.3 g/dL (ref 12.0–15.0)
MCH: 32 pg (ref 26.0–34.0)
MCHC: 33.6 g/dL (ref 30.0–36.0)
MCV: 95.3 fL (ref 80.0–100.0)
Platelets: 287 K/uL (ref 150–400)
RBC: 3.84 MIL/uL — ABNORMAL LOW (ref 3.87–5.11)
RDW: 13.6 % (ref 11.5–15.5)
WBC: 7.8 K/uL (ref 4.0–10.5)
nRBC: 0 % (ref 0.0–0.2)

## 2024-01-06 LAB — TROPONIN I (HIGH SENSITIVITY)
Troponin I (High Sensitivity): 6 ng/L (ref <18)
Troponin I (High Sensitivity): 6 ng/L (ref <18)

## 2024-01-06 MED ORDER — METOPROLOL SUCCINATE ER 25 MG PO TB24
25.0000 mg | ORAL_TABLET | Freq: Once | ORAL | Status: AC
  Administered 2024-01-06: 25 mg via ORAL
  Filled 2024-01-06: qty 1

## 2024-01-06 MED ORDER — METOPROLOL SUCCINATE ER 25 MG PO TB24: 25.0000 mg | ORAL_TABLET | Freq: Every day | ORAL | Status: DC

## 2024-01-06 NOTE — ED Provider Notes (Signed)
 [name] Provider Note    Event Date/Time   First MD Initiated Contact with Patient 01/06/24 1814     (approximate)   History   Hypertension and Chest Pain   HPI  Sarah Gordon is a 73 y.o. female history of hypertension, IBS, anxiety, hyperlipidemia, diabetes, presenting with chest pain and hypertension.  Patient had a lumbar injection done today.  Does have history of hypertension and did not take her blood pressure medications today.  States that she felt jittery and anxious earlier but does have history of anxiety and this seems consistent with her prior panic attack or anxiety episodes.  No shortness of breath or lightheadedness, did note some chest tightness earlier.  States that chest tightness has improved.  No recent travel or surgeries, no unilateral Or Tenderness, No History of Blood Clots.  Denies any incontinence, no focal weakness or numbness, no infectious symptoms.  Stated that she was at [facility] and they noted her blood pressures were high and was instructed to come in to be seen.  On independent chart review, she was seen by neurology at [facility], this was in October of this year, does have history of mild cognitive impairment with worsening memory, history of depression, restless leg syndrome and insomnia, also OSA.  Is on Aricept for the cognitive impairment.     Physical Exam   Triage Vital Signs: ED Triage Vitals  Encounter Vitals Group     BP 01/06/24 1710 (!) 156/90     Girls Systolic BP Percentile --      Girls Diastolic BP Percentile --      Boys Systolic BP Percentile --      Boys Diastolic BP Percentile --      Pulse Rate 01/06/24 1710 (!) 110     Resp 01/06/24 1710 19     Temp 01/06/24 1710 98 F (36.7 C)     Temp src --      SpO2 01/06/24 1710 98 %     Weight 01/06/24 1711 280 lb (127 kg)     Height 01/06/24 1711 5' 5 (1.651 m)     Head Circumference --      Peak Flow --      Pain Score 01/06/24 1711 3      Pain Loc --      Pain Education --      Exclude from Growth Chart --     Most recent vital signs: Vitals:   01/06/24 1830 01/06/24 1858  BP: (!) 162/84 (!) 162/84  Pulse: 94 (!) 101  Resp: 13   Temp:    SpO2: 99%      General: Awake, no distress.  CV:  Good peripheral perfusion.  Resp:  Normal effort.  No tachypnea or respiratory distress Abd:  No distention.  Soft nontender Other:  No unilateral calf spinal tenderness, no saddle anesthesia, no focal weakness or numbness, no midline spinal tenderness.   ED Results / Procedures / Treatments   Labs (all labs ordered are listed, but only abnormal results are displayed) Labs Reviewed  BASIC METABOLIC PANEL WITH GFR - Abnormal; Notable for the following components:      Result Value   Glucose, Bld 235 (*)    BUN 33 (*)    Creatinine, Ser 1.18 (*)    Calcium  8.8 (*)    GFR, Estimated 50 (*)    All other components within normal limits  CBC - Abnormal; Notable for the following components:  RBC 3.84 (*)    All other components within normal limits  TROPONIN I (HIGH SENSITIVITY)  TROPONIN I (HIGH SENSITIVITY)     EKG  EKG shows, sinus tachycardia, rate 106, normal QS, normal QTc, no obvious ischemic ST elevation, T wave flattening in V3, T wave changes new, to prior   RADIOLOGY On my independent interpretation, x-ray without obvious consolidation   PROCEDURES:  Critical Care performed: No  Procedures   MEDICATIONS ORDERED IN ED: Medications  metoprolol  succinate (TOPROL -XL) 24 hr tablet 25 mg (25 mg Oral Given 01/06/24 1858)     IMPRESSION / MDM / ASSESSMENT AND PLAN / ED COURSE  I reviewed the triage vital signs and the nursing notes.                              Differential diagnosis includes, but is not limited to, anxiety, hypertension, missed medication, atypical ACS, angina, arrhythmia.  Did consider PE but patient has no other risk factors for it.  Will get labs, EKG, troponin, chest x-ray.   Will give for her missed blood pressure medication.  Patient's presentation is most consistent with acute presentation with potential threat to life or bodily function.  Independent interpretation of labs and imaging below.  On reassessment patient is asymptomatic, would like to go home, discussed with her about imaging and lab results as well as taking her medications as prescribed.  Will also have her follow-up with primary care next week for reassessment.  Considered but no indication for inpatient admission at this time, she is safe for outpatient management.  Will discharge with strict return precautions.  Shared decision making by patient and husband and they are agreeable with this plan.  Discharge.  The patient is on the cardiac monitor to evaluate for evidence of arrhythmia and/or significant heart rate changes.   Clinical Course as of 01/06/24 1959  Fri Jan 06, 2024  1830 Independent review of labs, no leukocytosis, electrolytes not severely deranged, creatinine is stable, troponins not elevated. [TT]  1849 DG Chest 2 View [TT]  1849 DG Chest 2 View No active cardiopulmonary disease.  [TT]  1954 Troponin I (High Sensitivity) Troponin x 2 is negative. [TT]    Clinical Course User Index [TT] Waymond Lorelle Cummins, MD     FINAL CLINICAL IMPRESSION(S) / ED DIAGNOSES   Final diagnoses:  Chest tightness  Hypertension, unspecified type     Rx / DC Orders   ED Discharge Orders     None        Note:  This document was prepared using Dragon voice recognition software and may include unintentional dictation errors.    Waymond Lorelle Cummins, MD 01/06/24 [phone]

## 2024-01-06 NOTE — ED Triage Notes (Signed)
 Pt comes with c/o HTN. Pt takes meds for bp and didn't take today. Pt states she recently had lumbar injections and today was first day she has been out and about. Pt states she is feeling shakey and anxious.  Pt states no dizziness or sob. Pt states tightness in chest.

## 2024-01-06 NOTE — ED Notes (Signed)
 Pt given DC instructions. Pt verbalized understanding of follow up care. Pt ambulatory from ED without difficulty.

## 2024-01-06 NOTE — Discharge Instructions (Signed)
 Please make sure to take your medications as prescribed.  Please be sure to follow-up with primary care doctor for reassessment next week.

## 2024-01-10 ENCOUNTER — Other Ambulatory Visit: Payer: Self-pay | Admitting: Emergency Medicine

## 2024-01-10 DIAGNOSIS — N1831 Chronic kidney disease, stage 3a: Secondary | ICD-10-CM

## 2024-01-10 DIAGNOSIS — E119 Type 2 diabetes mellitus without complications: Secondary | ICD-10-CM

## 2024-01-12 ENCOUNTER — Ambulatory Visit: Payer: Self-pay

## 2024-01-12 ENCOUNTER — Ambulatory Visit

## 2024-01-12 VITALS — BP 143/105 | HR 101 | Temp 97.6°F | Ht 65.0 in | Wt 283.8 lb

## 2024-01-12 DIAGNOSIS — R252 Cramp and spasm: Secondary | ICD-10-CM

## 2024-01-12 NOTE — Telephone Encounter (Signed)
 FYI Only or Action Required?: FYI only for provider.  Patient was last seen in primary care on 12/16/2023 by Glenard Mire, MD.  Called Nurse Triage reporting Pain.  Symptoms began yesterday.  Interventions attempted: Nothing.  Symptoms are: gradually worsening.  Triage Disposition: See Physician Within 24 Hours  Patient/caregiver understands and will follow disposition?: Yes    Copied from CRM #[id number]. Topic: Clinical - Red Word Triage >> Jan 12, 2024  7:59 AM Sarah Gordon wrote: Red Word that prompted transfer to Nurse Triage:  She is having leg cramps in both legs for a few months. Last night she had cramps in both legs and pain level was a 10 Reason for Disposition  [1] Painful rash AND [2] multiple small blisters grouped together (i.e., dermatomal distribution or band or stripe)  Answer Assessment - Initial Assessment Questions 1. ONSET: When did the pain start?      Ongoing for few months and worsening last night 2. LOCATION: Where is the pain located?      Bilateral legs - starts in inner thigh  & sometimes starts in groin area and goes to down feet and causes feet to draw 3. PAIN: How bad is the pain?    (Scale 1-10; or mild, moderate, severe)     10/10  comes and goes 4. WORK OR EXERCISE: Has there been any recent work or exercise that involved this part of the body?      no 5. CAUSE: What do you think is causing the leg pain?     unknown 6. OTHER SYMPTOMS: Do you have any other symptoms? (e.g., chest pain, back pain, breathing difficulty, swelling, rash, fever, numbness, weakness)     Back pain-normal/not related leg pain,  7. PREGNANCY: Is there any chance you are pregnant? When was your last menstrual period?     Na  Pain comes and goes worsening last night to extreme pain and pain rotated back and forth between legs.Right leg upper thigh has new varicose vein-new onset  Protocols used: Leg Pain-A-AH

## 2024-01-12 NOTE — Progress Notes (Signed)
 Acute visit   Patient: Sarah Gordon   DOB: 25-Jul-1950   73 y.o. Female  MRN: [id number] PCP: Sowles, [name], MD   Chief Complaint  Patient presents with   Acute Visit    Both legs are cramping and pain on the outer thighs, inner part of her knees radiates to legs and feet.  Also pain/cramps sometimes in her lower abdomen on both reports this severe it has been 3 or 4 nights ago.  Feels like it is all in the muscles.   Subjective    Discussed the use of AI scribe software for clinical note transcription with the patient, who gave verbal consent to proceed.  History of Present Illness Sarah Gordon is a 73 year old female with restless leg syndrome and chronic kidney disease who presents with leg cramps. She is accompanied by her husband, Grayce.  She has been experiencing severe leg cramps over the past two nights, which are more intense than previous episodes. The cramps feel as though the muscles are about to rip and occur in her thighs, calves, and feet. They are more frequent at rest, particularly at night, and often wake her from sleep. Although she has a history of restless leg syndrome, she feels these cramps are different, describing a precursor sensation of 'bugs crawling' under her skin.  She has a history of disc disease in her lower back and neck and received cortisone shots in her lower vertebrae on October 15th. She also experienced a fall resulting in a bruised tailbone. She has been hospitalized for severe dehydration in early June. She reports issues with hydration, trying to drink consciously but sometimes forgetting.  Her current medications include magnesium supplements taken at night, ropinirole  for restless leg syndrome, B12 supplements taken with levothyroxine  in the morning, and Cymbalta . She has a history of diabetes with a last A1c of 6.2, and she does not currently take medication for diabetes. She has previously taken iron supplements but stopped  after her levels were deemed sufficient.  She has a history of varicose veins and has undergone procedures for them. She notes a varicose vein that became prominent after a recent leg cramps. She has not experienced blood clots in her legs before. She reports that after her episode of dehydration, she was told there was some kidney damage and has a history of chronic kidney disease.   Review of systems as noted in HPI.   Objective    BP (!) 143/105 (BP Location: Left Arm, Patient Position: Sitting, Cuff Size: Large)   Pulse (!) 101   Temp 97.6 F (36.4 C) (Oral)   Ht 5' 5 (1.651 m)   Wt 283 lb 12.8 oz (128.7 kg)   SpO2 98%   BMI 47.23 kg/m  Physical Exam Constitutional:      Appearance: Normal appearance.  HENT:     Head: Normocephalic and atraumatic.     Mouth/Throat:     Mouth: Mucous membranes are moist.  Eyes:     Pupils: Pupils are equal, round, and reactive to light.  Pulmonary:     Effort: Pulmonary effort is normal.  Musculoskeletal:     Right lower leg: No edema.     Left lower leg: No edema.  Skin:    General: Skin is warm.     Comments: Varicose veins present on bilateral lower legs  Neurological:     General: No focal deficit present.     Mental Status: She is  alert.       No results found for any visits on 01/12/24.  Assessment & Plan     Problem List Items Addressed This Visit   None Visit Diagnoses       Leg cramping    -  Primary   Relevant Orders   CBC   Iron, TIBC and Ferritin Panel   Magnesium   Comprehensive metabolic panel with GFR   TSH   Vitamin B12      Assessment & Plan Leg cramps  Acute on chronic leg cramps. Severe nocturnal leg cramps affecting thighs, feet, and legs, distinct from restless legs syndrome. Possible contributing factors include electrolyte imbalances, low B12, low thyroid  hormone, or varicose veins. - Order blood work for electrolytes, blood counts, thyroid  function, B12, iron, and magnesium levels. - Advise  electrolyte replenishment. - Considered gabapentin  or lyrica, however patient would like to avoid sedating medication given memory concerns - Communicate lab results via MyChart.  Varicose veins of lower extremities Presence of varicose veins with recent exacerbation following leg cramps. - Advise contacting vein specialist for follow-up appointment.   No orders of the defined types were placed in this encounter.    No follow-ups on file.      Isaiah DELENA Pepper, MD  [facility] [phone] (phone) [phone] (fax)

## 2024-01-13 ENCOUNTER — Ambulatory Visit: Payer: Self-pay

## 2024-01-13 ENCOUNTER — Other Ambulatory Visit: Payer: Self-pay

## 2024-01-13 DIAGNOSIS — E039 Hypothyroidism, unspecified: Secondary | ICD-10-CM

## 2024-01-13 LAB — CBC
Hematocrit: 39.2 % (ref 34.0–46.6)
Hemoglobin: 13 g/dL (ref 11.1–15.9)
MCH: 32 pg (ref 26.6–33.0)
MCHC: 33.2 g/dL (ref 31.5–35.7)
MCV: 97 fL (ref 79–97)
Platelets: 324 x10E3/uL (ref 150–450)
RBC: 4.06 x10E6/uL (ref 3.77–5.28)
RDW: 14 % (ref 11.7–15.4)
WBC: 9 x10E3/uL (ref 3.4–10.8)

## 2024-01-13 LAB — COMPREHENSIVE METABOLIC PANEL WITH GFR
ALT: 19 IU/L (ref 0–32)
AST: 18 IU/L (ref 0–40)
Albumin: 3.9 g/dL (ref 3.9–4.9)
Alkaline Phosphatase: 144 IU/L — ABNORMAL HIGH (ref 49–135)
BUN/Creatinine Ratio: 25 (ref 12–28)
BUN: 30 mg/dL — ABNORMAL HIGH (ref 8–27)
Bilirubin Total: 0.2 mg/dL (ref 0.0–1.2)
CO2: 21 mmol/L (ref 20–29)
Calcium: 8.9 mg/dL (ref 8.7–10.3)
Chloride: 102 mmol/L (ref 96–106)
Creatinine, Ser: 1.21 mg/dL — ABNORMAL HIGH (ref 0.57–1.00)
Globulin, Total: 2.4 g/dL (ref 1.5–4.5)
Glucose: 115 mg/dL — ABNORMAL HIGH (ref 70–99)
Potassium: 4.6 mmol/L (ref 3.5–5.2)
Sodium: 140 mmol/L (ref 134–144)
Total Protein: 6.3 g/dL (ref 6.0–8.5)
eGFR: 48 mL/min/1.73 — ABNORMAL LOW (ref >59)

## 2024-01-13 LAB — IRON,TIBC AND FERRITIN PANEL
Ferritin: 154 ng/mL — ABNORMAL HIGH (ref 15–150)
Iron Saturation: 20 % (ref 15–55)
Iron: 64 ug/dL (ref 27–139)
Total Iron Binding Capacity: 324 ug/dL (ref 250–450)
UIBC: 260 ug/dL (ref 118–369)

## 2024-01-13 LAB — TSH: TSH: 5.66 u[IU]/mL — ABNORMAL HIGH (ref 0.450–4.500)

## 2024-01-13 LAB — VITAMIN B12: Vitamin B-12: 730 pg/mL (ref 232–1245)

## 2024-01-13 LAB — MAGNESIUM: Magnesium: 2.2 mg/dL (ref 1.6–2.3)

## 2024-01-13 MED ORDER — LEVOTHYROXINE SODIUM 112 MCG PO TABS: 112.0000 ug | ORAL_TABLET | Freq: Every day | ORAL | 3 refills | Status: AC

## 2024-01-18 ENCOUNTER — Ambulatory Visit: Payer: Self-pay

## 2024-01-18 NOTE — Telephone Encounter (Signed)
 Copied from CRM #[id number]. Topic: Clinical - Red Word Triage >> Jan 18, 2024  1:24 PM Victoria B wrote: Reason for CRM: Patient called in says  seems to be having med reaction to her synthroid  med was increased by 2mg  with a different Dr for leg cramps and now its like she is having a period, I called patient back couldn't get an answer to get her to NT

## 2024-01-18 NOTE — Telephone Encounter (Signed)
 FYI Only or Action Required?: FYI only for provider: appointment scheduled on 10/30.  Patient was last seen in primary care on 01/12/2024 by Franchot Isaiah LABOR, MD.  Called Nurse Triage reporting Vaginal Bleeding.  Symptoms began yesterday.  Interventions attempted: Nothing.  Symptoms are: unchanged.  Triage Disposition: See PCP Within 2 Weeks  Patient/caregiver understands and will follow disposition?: Yes  **Patient may be a few minutes late to the 10/30 appointment**           Copied from CRM #[id number]. Topic: Clinical - Red Word Triage >> Jan 18, 2024  1:24 PM Victoria B wrote: Reason for CRM: Patient called in says  seems to be having med reaction to her synthroid  med was increased by 2mg  with a different Dr for leg cramps and now its like she is having a period, I called patient back couldn't get an answer to get her to N    Reason for Disposition  Postmenopausal vaginal bleeding  Answer Assessment - Initial Assessment Questions 1. BLEEDING SEVERITY: Describe the bleeding that you are having. How much bleeding is there?      Spotting, dark blood   2. ONSET: When did the bleeding begin? Is it continuing now?     X 1 day   3. MENOPAUSE: When was your last menstrual period?      20 years ago   4. ABDOMEN PAIN: Do you have any pain? How bad is the pain?  (e.g., Scale 0-10; none, mild, moderate, or severe)     No   5. BLOOD THINNERS: Do you take any blood thinners? (e.g., Coumadin/warfarin, Pradaxa/dabigatran, aspirin)     No   6. HORMONE MEDICINES: Are you taking any hormone medicines, prescription or OTC? (e.g., birth control pills, estrogen)     No, but suspects Synthroid  may be causing the issue as the dosage increased    7. CAUSE: What do you think is causing the bleeding? (e.g., recent gyn surgery, recent gyn procedure; known bleeding disorder, uterine cancer)         No, but suspects Synthroid  may be causing the issue as the dosage  increased    8. HEMODYNAMIC STATUS: Are you weak or feeling lightheaded? If Yes, ask: Can you stand and walk normally?       No   9. OTHER SYMPTOMS: What other symptoms are you having with the bleeding? (e.g., back pain, burning with urination, fever)     No   Patient called into triage stating she experiencing menopause for 20 years, yesterday she notice vaginal bleeding. Pt. Has a pad in place, and describes the bleeding as spotting, no clots, no tissue. Appt. Scheduled to follow up with provider.  Protocols used: Vaginal Bleeding - Postmenopausal-A-AH

## 2024-01-19 ENCOUNTER — Ambulatory Visit

## 2024-01-19 ENCOUNTER — Encounter: Payer: Self-pay | Admitting: Family Medicine

## 2024-01-19 ENCOUNTER — Ambulatory Visit (INDEPENDENT_AMBULATORY_CARE_PROVIDER_SITE_OTHER): Admitting: Family Medicine

## 2024-01-19 ENCOUNTER — Other Ambulatory Visit (HOSPITAL_COMMUNITY)
Admission: RE | Admit: 2024-01-19 | Discharge: 2024-01-19 | Disposition: A | Discharge status: Home / Self Care | Source: Ambulatory Visit | Attending: Nurse Practitioner | Admitting: Nurse Practitioner

## 2024-01-19 VITALS — BP 130/84 | HR 84 | Resp 16 | Ht 65.0 in | Wt 283.0 lb

## 2024-01-19 DIAGNOSIS — G2581 Restless legs syndrome: Secondary | ICD-10-CM

## 2024-01-19 DIAGNOSIS — N950 Postmenopausal bleeding: Secondary | ICD-10-CM

## 2024-01-19 DIAGNOSIS — Z1151 Encounter for screening for human papillomavirus (HPV): Secondary | ICD-10-CM | POA: Insufficient documentation

## 2024-01-19 DIAGNOSIS — Z124 Encounter for screening for malignant neoplasm of cervix: Secondary | ICD-10-CM | POA: Diagnosis not present

## 2024-01-19 DIAGNOSIS — E039 Hypothyroidism, unspecified: Secondary | ICD-10-CM

## 2024-01-19 MED ORDER — ROPINIROLE HCL 1 MG PO TABS
1.0000 mg | ORAL_TABLET | Freq: Four times a day (QID) | ORAL | 0 refills | Status: AC
Start: 2024-01-19 — End: ?

## 2024-01-19 NOTE — Progress Notes (Signed)
 Name: Sarah Gordon   MRN: [id number]    DOB: [birth year]   Date:01/19/2024       Progress Note  Subjective  Chief Complaint  Chief Complaint  Patient presents with   Vaginal Bleeding    X3 days, no pain in pelvic floor/vagina. Doesn't soak pads- but has small amounts of blood.   Discussed the use of AI scribe software for clinical note transcription with the patient, who gave verbal consent to proceed.  History of Present Illness Sarah Gordon is a 73 year old female with hypothyroidism and restless leg syndrome who presents with leg cramps and postmenopausal vaginal bleeding.  She has been experiencing severe leg cramps, particularly in her legs and thighs, which began approximately three to four nights before her acute visit on October 23rd. The cramps are described as 'horrific' with a crawling sensation in her legs. She has a history of restless leg syndrome and takes Requip  2 mg at 8:30 PM nightly. Recently, she experienced a cramp in the back of her thigh that was so intense it felt like her muscles were going to rip apart, likening the pain to labor pain.  She has been experiencing postmenopausal vaginal bleeding, which started two days ago, following a change in her thyroid  medication. The bleeding is described as spotting, and she has started to experience cramping in her lower abdomen. She has never experienced postmenopausal bleeding before. No burning sensation during urination.  During a recent visit, her TSH was found to be high, leading to an increase in her Synthroid  dose from 110 mcg to 112 mcg daily. She takes her thyroid  medication first thing in the morning with B12. Her kidney function was stable, but she was noted to be slightly dehydrated. Her magnesium levels were normal, and her iron storage was high but improved from six months ago.    Patient Active Problem List   Diagnosis Date Noted   Near syncope 09/04/2023   Calculus of gallbladder with acute  cholecystitis without obstruction 05/24/2023   Background diabetic retinopathy (HCC) 01/03/2023   Varicose veins with inflammation 07/16/2022   Cognitive complaints 10/27/2021   Polyp of colon    Lumbar radiculopathy 11/06/2020   Ventricular flutter (HCC) 12/06/2018   MDD (major depressive disorder), recurrent episode, mild 09/04/2018   DDD (degenerative disc disease), cervical 01/23/2016   Degenerative joint disease of left acromioclavicular joint 01/23/2016   Recurrent oral herpes simplex infection 09/01/2015   Irritable bowel syndrome with diarrhea 08/07/2015   Statin intolerance 02/28/2015   Asthma, intermittent 09/10/2014   Controlled type 2 diabetes with neuropathy (HCC) 09/10/2014   Fibromyalgia 09/10/2014   Obstructive sleep apnea 09/10/2014   Hyperlipidemia 12/29/2012   Essential hypertension 12/29/2012   Morbid obesity (HCC) 12/29/2012   Arthritis due to gout 12/13/2007   Acid reflux 10/24/2006   Adult hypothyroidism 10/24/2006   Localized osteoarthrosis 10/24/2006    Past Surgical History:  Procedure Laterality Date   BREAST BIOPSY Left    benign   CATARACT EXTRACTION W/PHACO Left 01/18/2023   Procedure: CATARACT EXTRACTION PHACO AND INTRAOCULAR LENS PLACEMENT (IOC) LEFT DIABETIC  CLAREON VIVITY TORIC LENS;  Surgeon: Jaye Fallow, MD;  Location: [facility];  Service: Ophthalmology;  Laterality: Left;  4.47 0:31.9   CATARACT EXTRACTION W/PHACO Right 02/01/2023   Procedure: CATARACT EXTRACTION PHACO AND INTRAOCULAR LENS PLACEMENT (IOC) RIGHT DIABETIC  CLAREON VIVITY TORIC LENS 3.68 00:28.9;  Surgeon: Jaye Fallow, MD;  Location: [facility];  Service: Ophthalmology;  Laterality: Right;  CHOLECYSTECTOMY     COLONOSCOPY     Dr Desiderio   COLONOSCOPY WITH PROPOFOL  N/A 11/19/2015   Procedure: COLONOSCOPY WITH PROPOFOL ;  Surgeon: Louanne KANDICE Muse, MD;  Location: [facility] ENDOSCOPY;  Service: Endoscopy;  Laterality: N/A;   COLONOSCOPY WITH PROPOFOL   N/A 06/18/2021   Procedure: COLONOSCOPY WITH PROPOFOL ;  Surgeon: Unk Corinn Skiff, MD;  Location: [facility] ENDOSCOPY;  Service: Gastroenterology;  Laterality: N/A;   ESOPHAGOGASTRODUODENOSCOPY N/A 08/20/2014   Procedure: ESOPHAGOGASTRODUODENOSCOPY (EGD);  Surgeon: Louanne KANDICE Muse, MD;  Location: [facility] END[facility]COPY;  Service: Endoscopy;  Laterality: N/A;   ESOPHAGOGASTRODUODENOSCOPY (EGD) WITH PROPOFOL  N/A 06/18/2021   Procedure: ESOPHAGOGASTRODUODENOSCOPY (EGD) WITH PROPOFOL ;  Surgeon: Unk Corinn Skiff, MD;  Location: [facility] ENDOSCOPY;  Service: Gastroenterology;  Laterality: N/A;   EYE SURGERY     Cataract removal   FOOT SURGERY Left    heel spur   HERNIA REPAIR     umbilical   NASAL SINUS SURGERY  1980s   UMBILICAL HERNIA REPAIR      Family History  Problem Relation Age of Onset   Hypertension Mother    Hyperlipidemia Mother    Diabetes Mother    Heart disease Mother    Stroke Mother    Varicose Veins Mother    Obesity Mother    Alcohol abuse Father    Lupus Sister    Arthritis Sister    Depression Sister    Obesity Sister    Dementia Sister 74       Alzheimer's dementia   Asthma Sister    Depression Sister    Hypertension Sister    Miscarriages / Stillbirths Sister    Varicose Veins Sister    Depression Sister    Arthritis Sister    Heart disease Sister    Hyperlipidemia Sister    Hypertension Sister    Miscarriages / Stillbirths Sister    Varicose Veins Sister    Dementia Sister    Dementia Sister        due to head trauma   Miscarriages / Stillbirths Sister    Arthritis Sister    Depression Sister    Heart disease Sister    Hyperlipidemia Sister    Hypertension Sister    Stroke Sister    Heart disease Brother 41       CABG x 3    Heart attack Brother 22   Hyperlipidemia Brother    Hypertension Brother    Diabetes Brother    Hypertension Brother    Obesity Brother    Asthma Brother    Depression Brother    Hyperlipidemia Brother    Alcohol abuse  Brother    Heart disease Brother    Stroke Brother    Varicose Veins Brother    Early death Brother    Stroke Brother    Arthritis Brother    Depression Brother    Hyperlipidemia Brother    Hypertension Brother    Stroke Maternal Grandfather    Breast cancer Neg Hx     Social History   Tobacco Use   Smoking status: Never    Passive exposure: Never   Smokeless tobacco: Never  Substance Use Topics   Alcohol use: No     Current Outpatient Medications:    acetaminophen  (TYLENOL ) 500 MG tablet, Take 1,000 mg by mouth every 6 (six) hours as needed for moderate pain (pain score 4-6)., Disp: , Rfl:    buPROPion  (WELLBUTRIN  XL) 150 MG 24 hr tablet, Take 1 tablet (150 mg total) by  mouth every morning., Disp: 90 tablet, Rfl: 1   DULoxetine  (CYMBALTA ) 60 MG capsule, Take 1 capsule (60 mg total) by mouth daily., Disp: 90 capsule, Rfl: 1   ezetimibe  (ZETIA ) 10 MG tablet, TAKE 1 TABLET BY MOUTH DAILY, Disp: 90 tablet, Rfl: 0   folic acid  (FOLVITE ) 800 MCG tablet, Take 800 mcg by mouth daily., Disp: , Rfl:    hydrOXYzine  (ATARAX ) 10 MG tablet, Take 1 tablet (10 mg total) by mouth 3 (three) times daily as needed for anxiety., Disp: 30 tablet, Rfl: 0   levothyroxine  (SYNTHROID ) 112 MCG tablet, Take 1 tablet (112 mcg total) by mouth daily., Disp: 30 tablet, Rfl: 3   metoprolol  succinate (TOPROL -XL) 25 MG 24 hr tablet, Take 1 tablet (25 mg total) by mouth daily., Disp: 90 tablet, Rfl: 1   montelukast  (SINGULAIR ) 10 MG tablet, Take 1 tablet (10 mg total) by mouth at bedtime., Disp: 90 tablet, Rfl: 1   Multiple Vitamin (MULTIVITAMIN) tablet, Take 1 tablet by mouth daily., Disp: , Rfl:    omeprazole  (PRILOSEC) 20 MG capsule, TAKE 1 CAPSULE BY MOUTH 2 TIMES A DAY BEFORE A MEAL, Disp: 180 capsule, Rfl: 0   ondansetron  (ZOFRAN ) 4 MG tablet, Take 1 tablet (4 mg total) by mouth every 8 (eight) hours as needed for nausea or vomiting., Disp: 20 tablet, Rfl: 0   Polyethyl Glycol-Propyl Glycol (SYSTANE OP),  Place 1 drop into both eyes daily as needed (dry eyes)., Disp: , Rfl:    QUEtiapine  (SEROQUEL ) 50 MG tablet, Take 50 mg by mouth at bedtime., Disp: , Rfl:    rOPINIRole  (REQUIP ) 1 MG tablet, Take 1 tablet (1 mg total) by mouth 4 (four) times daily., Disp: 120 tablet, Rfl: 0  Allergies  Allergen Reactions   Vascepa [Icosapent Ethyl (Epa Ethyl Ester) (Fish)] Hives and Swelling   Aspir-81 [Aspirin] Other (See Comments)    Coagulation disorder, causes bruising    Gabapentin  Other (See Comments)    Cognitive/memory issues   Metformin And Related     indigestion   Mounjaro  [Tirzepatide ] Diarrhea    Epigastric pain    Peanut-Containing Drug Products     Tongue goes numb from peanuts   Statins Other (See Comments)    Joint pain   Topamax [Topiramate] Other (See Comments)    Cognitive issues    Voltaren [Diclofenac Sodium] Hives and Other (See Comments)   Lipitor [Atorvastatin]     Joint pain    I personally reviewed active problem list, medication list, allergies with the patient/caregiver today.   ROS  Ten systems reviewed and is negative except as mentioned in HPI    Objective Physical Exam CONSTITUTIONAL: Patient appears well-developed and well-nourished. No distress. HEENT: Head atraumatic, normocephalic, neck supple. CARDIOVASCULAR: Normal rate, regular rhythm and normal heart sounds. No murmur heard. No BLE edema. PULMONARY: Effort normal and breath sounds normal. No respiratory distress. ABDOMINAL: There is no tenderness or distention. PELVIC: blood on vagina coming from cervical os, normal vaginal walls, no lesions on vulva or cervix noticed, no polyps, uterus seems lobulated  PSYCHIATRIC: Patient has a normal mood and affect. Behavior is normal. Judgment and thought content normal.  Vitals:   01/19/24 0930  BP: 130/84  Pulse: 84  Resp: 16  SpO2: 96%  Weight: 283 lb (128.4 kg)  Height: 5' 5 (1.651 m)    Body mass index is 47.09 kg/m.  Recent Results (from the  past 2160 hours)  POCT glycosylated hemoglobin (Hb A1C)     Status: Abnormal  Collection Time: 11/03/23  8:42 AM  Result Value Ref Range   Hemoglobin A1C 6.2 (A) 4.0 - 5.6 %   HbA1c POC (<> result, manual entry)     HbA1c, POC (prediabetic range)     HbA1c, POC (controlled diabetic range)    Lipid panel     Status: Abnormal   Collection Time: 11/03/23  9:27 AM  Result Value Ref Range   Cholesterol 274 (H) <200 mg/dL   HDL 43 (L) > OR = 50 mg/dL   Triglycerides 769 (H) <150 mg/dL    Comment: . If a non-fasting specimen was collected, consider repeat triglyceride testing on a fasting specimen if clinically indicated.  Veatrice et al. J. of Clin. Lipidol. 2015;9:129-169. SABRA    LDL Cholesterol (Calc) 188 (H) mg/dL (calc)    Comment: Reference range: <100 . Desirable range <100 mg/dL for primary prevention;   <70 mg/dL for patients with CHD or diabetic patients  with > or = 2 CHD risk factors. SABRA LDL-C is now calculated using the Martin-Hopkins  calculation, which is a validated novel method providing  better accuracy than the Friedewald equation in the  estimation of LDL-C.  Gladis APPLETHWAITE et al. SANDREA. 7986;689(80): 2061-2068  (http://education.[facility].com/faq/FAQ164)    Total CHOL/HDL Ratio 6.4 (H) <5.0 (calc)   Non-HDL Cholesterol (Calc) 231 (H) <130 mg/dL (calc)    Comment: Non-HDL level > or = 220 is very high and may indicate  genetic familial hypercholesterolemia (FH). Clinical  assessment and measurement of blood lipid levels  should be considered for all first-degree relatives  of patients with an FH diagnosis. . For patients with diabetes plus 1 major ASCVD risk  factor, treating to a non-HDL-C goal of <100 mg/dL  (LDL-C of <29 mg/dL) is considered a therapeutic  option.   CBC with Differential/Platelet     Status: None   Collection Time: 11/03/23  9:27 AM  Result Value Ref Range   WBC 6.0 3.8 - 10.8 Thousand/uL   RBC 3.89 3.80 - 5.10 Million/uL    Hemoglobin 12.3 11.7 - 15.5 g/dL   HCT 62.1 64.9 - 54.9 %   MCV 97.2 80.0 - 100.0 fL   MCH 31.6 27.0 - 33.0 pg   MCHC 32.5 32.0 - 36.0 g/dL    Comment: For adults, a slight decrease in the calculated MCHC value (in the range of 30 to 32 g/dL) is most likely not clinically significant; however, it should be interpreted with caution in correlation with other red cell parameters and the patient's clinical condition.    RDW 14.0 11.0 - 15.0 %   Platelets 275 140 - 400 Thousand/uL   MPV 9.3 7.5 - 12.5 fL   Neutro Abs 4,578 1,500 - 7,800 cells/uL   Absolute Lymphocytes 876 850 - 3,900 cells/uL   Absolute Monocytes 336 200 - 950 cells/uL   Eosinophils Absolute 168 15 - 500 cells/uL   Basophils Absolute 42 0 - 200 cells/uL   Neutrophils Relative % 76.3 %   Total Lymphocyte 14.6 %   Monocytes Relative 5.6 %   Eosinophils Relative 2.8 %   Basophils Relative 0.7 %  Comprehensive metabolic panel with GFR     Status: Abnormal   Collection Time: 11/03/23  9:27 AM  Result Value Ref Range   Glucose, Bld 139 (H) 65 - 99 mg/dL    Comment: .            Fasting reference interval . For someone without known diabetes, a glucose value >125  mg/dL indicates that they may have diabetes and this should be confirmed with a follow-up test. .    BUN 28 (H) 7 - 25 mg/dL   Creat 8.78 (H) 9.39 - 1.00 mg/dL   eGFR 48 (L) > OR = 60 mL/min/1.59m2   BUN/Creatinine Ratio 23 (H) 6 - 22 (calc)   Sodium 138 135 - 146 mmol/L   Potassium 4.5 3.5 - 5.3 mmol/L   Chloride 105 98 - 110 mmol/L   CO2 25 20 - 32 mmol/L   Calcium  9.2 8.6 - 10.4 mg/dL   Total Protein 6.6 6.1 - 8.1 g/dL   Albumin  4.0 3.6 - 5.1 g/dL   Globulin 2.6 1.9 - 3.7 g/dL (calc)   AG Ratio 1.5 1.0 - 2.5 (calc)   Total Bilirubin 0.4 0.2 - 1.2 mg/dL   Alkaline phosphatase (APISO) 109 37 - 153 U/L   AST 19 10 - 35 U/L   ALT 18 6 - 29 U/L  B12 and Folate Panel     Status: None   Collection Time: 11/03/23  9:27 AM  Result Value Ref Range    Vitamin B-12 372 200 - 1,100 pg/mL    Comment: . Please Note: Although the reference range for vitamin B12 is [id number] pg/mL, it has been reported that between 5 and 10% of patients with values between 200 and 400 pg/mL may experience neuropsychiatric and hematologic abnormalities due to occult B12 deficiency; less than 1% of patients with values above 400 pg/mL will have symptoms. .    Folate >24.0 ng/mL    Comment:                            Reference Range                            Low:           <3.4                            Borderline:    3.4-5.4                            Normal:        >5.4 .   TSH     Status: None   Collection Time: 11/03/23  9:27 AM  Result Value Ref Range   TSH 3.23 0.40 - 4.50 mIU/L  Basic metabolic panel     Status: Abnormal   Collection Time: 12/14/23  6:04 PM  Result Value Ref Range   Sodium 136 135 - 145 mmol/L   Potassium 4.1 3.5 - 5.1 mmol/L   Chloride 102 98 - 111 mmol/L   CO2 25 22 - 32 mmol/L   Glucose, Bld 184 (H) 70 - 99 mg/dL    Comment: Glucose reference range applies only to samples taken after fasting for at least 8 hours.   BUN 25 (H) 8 - 23 mg/dL   Creatinine, Ser 8.86 (H) 0.44 - 1.00 mg/dL   Calcium  8.8 (L) 8.9 - 10.3 mg/dL   GFR, Estimated 52 (L) >60 mL/min    Comment: (NOTE) Calculated using the CKD-EPI Creatinine Equation (2021)    Anion gap 9 5 - 15    Comment: Performed at [facility], [address] [place], [place] [address]  CBC  Status: Abnormal   Collection Time: 12/14/23  6:04 PM  Result Value Ref Range   WBC 6.2 4.0 - 10.5 K/uL   RBC 3.79 (L) 3.87 - 5.11 MIL/uL   Hemoglobin 12.0 12.0 - 15.0 g/dL   HCT 64.0 (L) 63.9 - 53.9 %   MCV 94.7 80.0 - 100.0 fL   MCH 31.7 26.0 - 34.0 pg   MCHC 33.4 30.0 - 36.0 g/dL   RDW 86.9 88.4 - 84.4 %   Platelets 257 150 - 400 K/uL   nRBC 0.0 0.0 - 0.2 %    Comment: Performed at [facility], [address], [place], [place] [address]  Troponin I (High  Sensitivity)     Status: None   Collection Time: 12/14/23  6:04 PM  Result Value Ref Range   Troponin I (High Sensitivity) 5 <18 ng/L    Comment: (NOTE) Elevated high sensitivity troponin I (hsTnI) values and significant  changes across serial measurements may suggest ACS but many other  chronic and acute conditions are known to elevate hsTnI results.  Refer to the Links section for chest pain algorithms and additional  guidance. Performed at [facility], [address] [place], [place] [address]   Troponin I (High Sensitivity)     Status: None   Collection Time: 12/14/23  9:03 PM  Result Value Ref Range   Troponin I (High Sensitivity) 4 <18 ng/L    Comment: (NOTE) Elevated high sensitivity troponin I (hsTnI) values and significant  changes across serial measurements may suggest ACS but many other  chronic and acute conditions are known to elevate hsTnI results.  Refer to the Links section for chest pain algorithms and additional  guidance. Performed at [facility], [address] [place], [place] [address]   Basic metabolic panel     Status: Abnormal   Collection Time: 01/06/24  5:05 PM  Result Value Ref Range   Sodium 140 135 - 145 mmol/L   Potassium 3.8 3.5 - 5.1 mmol/L   Chloride 107 98 - 111 mmol/L   CO2 22 22 - 32 mmol/L   Glucose, Bld 235 (H) 70 - 99 mg/dL    Comment: Glucose reference range applies only to samples taken after fasting for at least 8 hours.   BUN 33 (H) 8 - 23 mg/dL   Creatinine, Ser 8.81 (H) 0.44 - 1.00 mg/dL   Calcium  8.8 (L) 8.9 - 10.3 mg/dL   GFR, Estimated 50 (L) >60 mL/min    Comment: (NOTE) Calculated using the CKD-EPI Creatinine Equation (2021)    Anion gap 11 5 - 15    Comment: Performed at [facility], [address] [place], [place] [address]  CBC     Status: Abnormal   Collection Time: 01/06/24  5:05 PM  Result Value Ref Range   WBC 7.8 4.0 - 10.5 K/uL   RBC 3.84 (L) 3.87 - 5.11 MIL/uL    Hemoglobin 12.3 12.0 - 15.0 g/dL   HCT 63.3 63.9 - 53.9 %   MCV 95.3 80.0 - 100.0 fL   MCH 32.0 26.0 - 34.0 pg   MCHC 33.6 30.0 - 36.0 g/dL   RDW 86.3 88.4 - 84.4 %   Platelets 287 150 - 400 K/uL   nRBC 0.0 0.0 - 0.2 %    Comment: Performed at [facility], [address], [place], [place] [address]  Troponin I (High Sensitivity)     Status: None   Collection Time: 01/06/24  5:05  PM  Result Value Ref Range   Troponin I (High Sensitivity) 6 <18 ng/L    Comment: (NOTE) Elevated high sensitivity troponin I (hsTnI) values and significant  changes across serial measurements may suggest ACS but many other  chronic and acute conditions are known to elevate hsTnI results.  Refer to the Links section for chest pain algorithms and additional  guidance. Performed at [facility], [address] [place], [place] [address]   Troponin I (High Sensitivity)     Status: None   Collection Time: 01/06/24  7:00 PM  Result Value Ref Range   Troponin I (High Sensitivity) 6 <18 ng/L    Comment: (NOTE) Elevated high sensitivity troponin I (hsTnI) values and significant  changes across serial measurements may suggest ACS but many other  chronic and acute conditions are known to elevate hsTnI results.  Refer to the Links section for chest pain algorithms and additional  guidance. Performed at [facility], [address] [place], [place] [address]   CBC     Status: None   Collection Time: 01/12/24  3:30 PM  Result Value Ref Range   WBC 9.0 3.4 - 10.8 x10E3/uL   RBC 4.06 3.77 - 5.28 x10E6/uL   Hemoglobin 13.0 11.1 - 15.9 g/dL   Hematocrit 60.7 65.9 - 46.6 %   MCV 97 79 - 97 fL   MCH 32.0 26.6 - 33.0 pg   MCHC 33.2 31.5 - 35.7 g/dL   RDW 85.9 88.2 - 84.5 %   Platelets 324 150 - 450 x10E3/uL  Iron, TIBC and Ferritin Panel     Status: Abnormal   Collection Time: 01/12/24  3:30 PM  Result Value Ref Range   Total Iron Binding Capacity 324 250 - 450 ug/dL   UIBC  739 881 - 630 ug/dL   Iron 64 27 - 860 ug/dL   Iron Saturation 20 15 - 55 %   Ferritin 154 (H) 15 - 150 ng/mL  Magnesium     Status: None   Collection Time: 01/12/24  3:30 PM  Result Value Ref Range   Magnesium 2.2 1.6 - 2.3 mg/dL  Comprehensive metabolic panel with GFR     Status: Abnormal   Collection Time: 01/12/24  3:30 PM  Result Value Ref Range   Glucose 115 (H) 70 - 99 mg/dL   BUN 30 (H) 8 - 27 mg/dL   Creatinine, Ser 8.78 (H) 0.57 - 1.00 mg/dL   eGFR 48 (L) >40 fO/fpw/8.26   BUN/Creatinine Ratio 25 12 - 28   Sodium 140 134 - 144 mmol/L   Potassium 4.6 3.5 - 5.2 mmol/L   Chloride 102 96 - 106 mmol/L   CO2 21 20 - 29 mmol/L   Calcium  8.9 8.7 - 10.3 mg/dL   Total Protein 6.3 6.0 - 8.5 g/dL   Albumin  3.9 3.9 - 4.9 g/dL   Globulin, Total 2.4 1.5 - 4.5 g/dL   Bilirubin Total 0.2 0.0 - 1.2 mg/dL   Alkaline Phosphatase 144 (H) 49 - 135 IU/L   AST 18 0 - 40 IU/L   ALT 19 0 - 32 IU/L  TSH     Status: Abnormal   Collection Time: 01/12/24  3:30 PM  Result Value Ref Range   TSH 5.660 (H) 0.450 - 4.500 uIU/mL  Vitamin B12     Status: None   Collection Time: 01/12/24  3:30 PM  Result Value Ref Range   Vitamin B-12 730 232 - 1,245 pg/mL  Diabetic Foot Exam:     PHQ2/9:    01/12/2024    2:46 PM 12/16/2023    9:55 AM 11/03/2023    8:36 AM 06/16/2023   10:18 AM 05/09/2023    8:47 AM  Depression screen PHQ 2/9  Decreased Interest 1 2 1  0 1  Down, Depressed, Hopeless 1 2 1  0 1  PHQ - 2 Score 2 4 2  0 2  Altered sleeping 2 0 1 0 1  Tired, decreased energy 1 3 1  0 1  Change in appetite 1 2 1  0 0  Feeling bad or failure about yourself  1 0 0 0 0  Trouble concentrating 0 2 1 0 1  Moving slowly or fidgety/restless 0 0 0 0 0  Suicidal thoughts 0 0 0 0 0  PHQ-9 Score 7 11 6  0 5  Difficult doing work/chores Somewhat difficult Very difficult Somewhat difficult Not difficult at all Somewhat difficult    phq 9 is positive  Fall Risk:    01/12/2024    2:46 PM 12/16/2023     9:45 AM 11/03/2023    8:30 AM 06/16/2023   10:12 AM 05/23/2023    9:53 AM  Fall Risk   Falls in the past year? 1 1 0 1 0  Number falls in past yr: 1 0 0 0 0  Injury with Fall? 1 1 0 0 0  Risk for fall due to :  Impaired balance/gait No Fall Risks Impaired balance/gait   Follow up  Falls evaluation completed Falls evaluation completed Falls evaluation completed       Assessment & Plan Postmenopausal bleeding Bleeding for three days with cramping. Differential includes endometrial hyperplasia, polyps, or malignancy. Cervical bleeding observed, fibroid present, no polyps noted. - Order pelvic ultrasound to assess endometrial stripe thickness. - Perform Pap smear. - Refer to gynecologist for further evaluation. - Ensure urgent follow-up with gynecologist.  Possible uterine fibroid  Fibroid potentially contributing to postmenopausal bleeding and cramping.  Restless legs syndrome Severe leg cramps and crawling sensation. Symptoms not controlled with current Requip  regimen. - Increase Requip  to 1 mg in the morning, 1 mg in the afternoon, and 2 mg at night. - Send prescription to [facility]. - Advise on hydration to reduce cramps.  Hypothyroidism Elevated TSH indicating suboptimal control. Recent increase in Synthroid  from 110 mcg to 112 mcg. - Continue Synthroid  112 mcg daily. - Advise to take Synthroid  alone, not with B12. - Re-evaluate TSH in 6-8 weeks.

## 2024-01-20 ENCOUNTER — Ambulatory Visit
Admission: RE | Admit: 2024-01-20 | Discharge: 2024-01-20 | Disposition: A | Discharge status: Home / Self Care | Source: Ambulatory Visit | Attending: Family Medicine | Admitting: Family Medicine

## 2024-01-20 DIAGNOSIS — N950 Postmenopausal bleeding: Secondary | ICD-10-CM | POA: Insufficient documentation

## 2024-01-22 ENCOUNTER — Encounter: Payer: Self-pay | Admitting: Family Medicine

## 2024-01-23 ENCOUNTER — Other Ambulatory Visit: Payer: Self-pay | Admitting: Family Medicine

## 2024-01-23 ENCOUNTER — Ambulatory Visit: Payer: Self-pay | Admitting: Family Medicine

## 2024-01-23 DIAGNOSIS — E039 Hypothyroidism, unspecified: Secondary | ICD-10-CM

## 2024-01-23 DIAGNOSIS — N950 Postmenopausal bleeding: Secondary | ICD-10-CM

## 2024-01-24 LAB — CYTOLOGY - PAP
Comment: NEGATIVE
High risk HPV: NEGATIVE

## 2024-02-02 ENCOUNTER — Encounter: Payer: Self-pay | Admitting: Family Medicine

## 2024-02-03 ENCOUNTER — Other Ambulatory Visit: Payer: Self-pay | Admitting: Family Medicine

## 2024-02-03 DIAGNOSIS — R252 Cramp and spasm: Secondary | ICD-10-CM

## 2024-02-06 DIAGNOSIS — Z818 Family history of other mental and behavioral disorders: Secondary | ICD-10-CM | POA: Diagnosis not present

## 2024-02-06 DIAGNOSIS — G3184 Mild cognitive impairment, so stated: Secondary | ICD-10-CM | POA: Diagnosis not present

## 2024-02-06 DIAGNOSIS — F339 Major depressive disorder, recurrent, unspecified: Secondary | ICD-10-CM | POA: Diagnosis not present

## 2024-02-06 DIAGNOSIS — G479 Sleep disorder, unspecified: Secondary | ICD-10-CM | POA: Diagnosis not present

## 2024-02-06 DIAGNOSIS — G2581 Restless legs syndrome: Secondary | ICD-10-CM | POA: Diagnosis not present

## 2024-02-08 LAB — OPHTHALMOLOGY REPORT-SCANNED

## 2024-02-09 ENCOUNTER — Encounter: Payer: Self-pay | Admitting: Family Medicine

## 2024-02-10 ENCOUNTER — Other Ambulatory Visit: Payer: Self-pay | Admitting: Family Medicine

## 2024-02-10 DIAGNOSIS — G4733 Obstructive sleep apnea (adult) (pediatric): Secondary | ICD-10-CM

## 2024-02-14 ENCOUNTER — Other Ambulatory Visit: Payer: Self-pay | Admitting: Family Medicine

## 2024-02-14 DIAGNOSIS — G2581 Restless legs syndrome: Secondary | ICD-10-CM

## 2024-02-15 NOTE — Telephone Encounter (Signed)
 Second request

## 2024-02-22 ENCOUNTER — Other Ambulatory Visit: Payer: Self-pay | Admitting: Family Medicine

## 2024-02-22 ENCOUNTER — Encounter: Payer: Self-pay | Admitting: Family Medicine

## 2024-02-22 DIAGNOSIS — B001 Herpesviral vesicular dermatitis: Secondary | ICD-10-CM

## 2024-02-22 MED ORDER — ACYCLOVIR 400 MG PO TABS: 400.0000 mg | ORAL_TABLET | Freq: Three times a day (TID) | ORAL | 1 refills | Status: AC

## 2024-02-26 ENCOUNTER — Other Ambulatory Visit: Payer: Self-pay | Admitting: Family Medicine

## 2024-02-26 DIAGNOSIS — E1169 Type 2 diabetes mellitus with other specified complication: Secondary | ICD-10-CM

## 2024-02-28 ENCOUNTER — Ambulatory Visit: Admitting: Sleep Medicine

## 2024-02-28 NOTE — Telephone Encounter (Signed)
 Second requet

## 2024-03-01 ENCOUNTER — Ambulatory Visit: Admitting: Sleep Medicine

## 2024-03-01 ENCOUNTER — Encounter: Payer: Self-pay | Admitting: Sleep Medicine

## 2024-03-01 VITALS — BP 128/86 | HR 68 | Temp 98.5°F | Ht 65.0 in | Wt 290.6 lb

## 2024-03-01 DIAGNOSIS — I10 Essential (primary) hypertension: Secondary | ICD-10-CM | POA: Diagnosis not present

## 2024-03-01 DIAGNOSIS — Z6841 Body Mass Index (BMI) 40.0 and over, adult: Secondary | ICD-10-CM | POA: Diagnosis not present

## 2024-03-01 DIAGNOSIS — G4733 Obstructive sleep apnea (adult) (pediatric): Secondary | ICD-10-CM

## 2024-03-01 NOTE — Patient Instructions (Signed)

## 2024-03-01 NOTE — Progress Notes (Signed)
 Name:Sarah Gordon MRN: [id number] DOB: 19-Jun-1950   CHIEF COMPLAINT:  ESTABLISH CARE FOR OSA   HISTORY OF PRESENT ILLNESS: Sarah Gordon is a 73 y.o. w/ a h/o OSA, HTN, asthma, anxiety, depression, dementia, RLS and morbid obesity who presents to establish care for OSA. Reports that she was initially diagnosed with OSA several years ago and used CPAP therapy intermittently. Reports undergoing repeat study in March 2025 and was subsequently restarted on CPAP therapy. Reports difficulty using CPAP therapy consistently due to mask and pressure discomfort. States that she has tried using the Airfit F30i FFM, which was uncomfortable and caused significant air leaks.   Reports c/o loud snoring and excessive daytime sleepiness which has been present for several years. Denies any nocturnal awakenings. Denies any significant weight changes. Denies morning headaches, RLS symptoms, dream enactment, cataplexy, hypnagogic or hypnapompic hallucinations. Reports a family history of sleep apnea. Admits to occasional drowsy driving. Drinks coffee a few times per week, denies alcohol, tobacco or illicit drug use.   Bedtime 11 pm Sleep onset varies  Rise time 9 am   PAST MEDICAL HISTORY :   has a past medical history of Allergy, Anxiety (95), Asthma, Cataract, Depression (95), Esophageal reflux, Gouty arthropathy, unspecified, Herpes simplex without mention of complication, Irritable bowel syndrome, Localized osteoarthrosis not specified whether primary or secondary, unspecified site, Migraine, unspecified, without mention of intractable migraine without mention of status migrainosus, Mild cognitive impairment, Mixed hyperlipidemia, Myalgia and myositis, unspecified, Obstructive sleep apnea (adult) (pediatric), Palpitations, Sleep apnea, Type II or unspecified type diabetes mellitus without mention of complication, not stated as uncontrolled, Umbilical hernia without mention of obstruction or gangrene,  Unspecified essential hypertension, and Unspecified hypothyroidism.  has a past surgical history that includes Foot surgery (Left); Nasal sinus surgery (1980s); Umbilical hernia repair; Hernia repair; Colonoscopy; Esophagogastroduodenoscopy (N/A, 08/20/2014); Colonoscopy with propofol  (N/A, 11/19/2015); Breast biopsy (Left); Colonoscopy with propofol  (N/A, 06/18/2021); Esophagogastroduodenoscopy (egd) with propofol  (N/A, 06/18/2021); Cataract extraction w/PHACO (Left, 01/18/2023); Cataract extraction w/PHACO (Right, 02/01/2023); Cholecystectomy; and Eye surgery. Prior to Admission medications  Medication Sig Start Date End Date Taking? Authorizing Provider  acetaminophen  (TYLENOL ) 500 MG tablet Take 1,000 mg by mouth every 6 (six) hours as needed for moderate pain (pain score 4-6).   Yes [provider]  acyclovir  (ZOVIRAX ) 400 MG tablet Take 1 tablet (400 mg total) by mouth 3 (three) times daily. 02/22/24  Yes Sowles, [name], MD  buPROPion  (WELLBUTRIN  XL) 150 MG 24 hr tablet Take 1 tablet (150 mg total) by mouth every morning. 11/03/23  Yes Sowles, [name], MD  donepezil (ARICEPT) 10 MG tablet Take 10 mg by mouth daily. 02/06/24  Yes [provider]  DULoxetine  (CYMBALTA ) 60 MG capsule Take 1 capsule (60 mg total) by mouth daily. 11/03/23  Yes Sowles, [name], MD  ezetimibe  (ZETIA ) 10 MG tablet TAKE 1 TABLET BY MOUTH DAILY 02/28/24  Yes Sowles, [name], MD  folic acid  (FOLVITE ) 800 MCG tablet Take 800 mcg by mouth daily.   Yes [provider]  hydrOXYzine  (ATARAX ) 10 MG tablet Take 1 tablet (10 mg total) by mouth 3 (three) times daily as needed for anxiety. 08/16/23  Yes Sowles, [name], MD  levothyroxine  (SYNTHROID ) 112 MCG tablet Take 1 tablet (112 mcg total) by mouth daily. 01/13/24  Yes Franchot Isaiah LABOR, MD  lidocaine  (LIDODERM ) 5 % Place 1 patch onto the skin daily. 02/29/24  Yes [provider]  metoprolol  succinate (TOPROL -XL) 25 MG 24 hr tablet Take 1 tablet  (  25 mg total) by mouth daily. 11/03/23  Yes Sowles, [name], MD  montelukast  (SINGULAIR ) 10 MG tablet Take 1 tablet (10 mg total) by mouth at bedtime. 11/03/23  Yes Sowles, [name], MD  Multiple Vitamin (MULTIVITAMIN) tablet Take 1 tablet by mouth daily.   Yes [provider]  olmesartan -hydrochlorothiazide  (BENICAR  HCT) 40-12.5 MG tablet Take 1 tablet by mouth daily. 12/12/23  Yes [provider]  omeprazole  (PRILOSEC) 20 MG capsule TAKE 1 CAPSULE BY MOUTH 2 TIMES A DAY BEFORE A MEAL 12/16/23  Yes Sowles, [name], MD  ondansetron  (ZOFRAN ) 4 MG tablet Take 1 tablet (4 mg total) by mouth every 8 (eight) hours as needed for nausea or vomiting. 05/19/23  Yes Sowles, [name], MD  Polyethyl Glycol-Propyl Glycol (SYSTANE OP) Place 1 drop into both eyes daily as needed (dry eyes).   Yes [provider]  pregabalin (LYRICA) 25 MG capsule Take 25 mg by mouth 2 (two) times daily. 02/06/24  Yes [provider]  QUEtiapine  (SEROQUEL ) 50 MG tablet Take 50 mg by mouth at bedtime. 10/26/23  Yes [provider]  rOPINIRole  (REQUIP ) 2 MG tablet Take 2 mg by mouth. QID 02/02/24  Yes [provider]  traMADol  (ULTRAM ) 50 MG tablet Take 50 mg by mouth every 6 (six) hours as needed. 12/16/23  Yes [provider]   Allergies[1]  FAMILY HISTORY:  family history includes Alcohol abuse in her brother and father; Arthritis in her brother, sister, sister, and sister; Asthma in her brother and sister; Dementia in her sister and sister; Dementia (age of onset: 17) in her sister; Depression in her brother, brother, sister, sister, sister, and sister; Diabetes in her brother and mother; Early death in her brother; Heart attack (age of onset: 44) in her brother; Heart disease in her brother, mother, sister, and sister; Heart disease (age of onset: 5) in her brother; Hyperlipidemia in her brother, brother, brother, mother, sister, and sister; Hypertension in her brother,  brother, brother, mother, sister, sister, and sister; Lupus in her sister; Miscarriages / Stillbirths in her sister, sister, and sister; Obesity in her brother, mother, and sister; Stroke in her brother, brother, maternal grandfather, mother, and sister; Varicose Veins in her brother, mother, sister, and sister. SOCIAL HISTORY:  reports that she has never smoked. She has never been exposed to tobacco smoke. She has never used smokeless tobacco. She reports that she does not drink alcohol and does not use drugs.   Review of Systems:  Gen:  Denies  fever, sweats, chills weight loss  HEENT: Denies blurred vision, double vision, ear pain, eye pain, hearing loss, nose bleeds, sore throat Cardiac:  No dizziness, chest pain or heaviness, chest tightness,edema, No JVD Resp:   No cough, -sputum production, -shortness of breath,-wheezing, -hemoptysis,  Gi: Denies swallowing difficulty, stomach pain, nausea or vomiting, diarrhea, constipation, bowel incontinence Gu:  Denies bladder incontinence, burning urine Ext:   Denies Joint pain, stiffness or swelling Skin: Denies  skin rash, easy bruising or bleeding or hives Endoc:  Denies polyuria, polydipsia , polyphagia or weight change Psych:   Denies depression, insomnia or hallucinations  Other:  All other systems negative  VITAL SIGNS: BP 128/86   Pulse 68   Temp 98.5 F (36.9 C)   Ht 5' 5 (1.651 m)   Wt 290 lb 9.6 oz (131.8 kg)   SpO2 97%   BMI 48.36 kg/m    Physical Examination:   General Appearance: No distress  EYES PERRLA, EOM intact.   NECK Supple,  No JVD Pulmonary: normal breath sounds, No wheezing.  CardiovascularNormal S1,S2.  No m/r/g.   Abdomen: Benign, Soft, non-tender. Skin:   warm, no rashes, no ecchymosis  Extremities: normal, no cyanosis, clubbing. Neuro:without focal findings,  speech normal  PSYCHIATRIC: Mood, affect within normal limits.   ASSESSMENT AND PLAN  OSA Restarting Sarah Gordon on CPAP therapy, to improve  compliance and comfort will change pressure range to 4-12 cm H2O and will try her on the Airtouch N30i nasal mask. Discussed the consequences of untreated sleep apnea. Advised not to drive drowsy for safety of Sarah Gordon and others. Will follow up in 3 months.    HTN Stable, on current management. Following with PCP.   Morbid obesity Counseled Sarah Gordon on diet and lifestyle modification.    Sarah Gordon  satisfied with Plan of action and management. All questions answered  I spent a total of 46 minutes reviewing chart data, face-to-face evaluation with the Sarah Gordon, counseling and coordination of care as detailed above.    Zyann Mabry, M.D.  Sleep Medicine [facility]           [1]  Allergies Allergen Reactions   Vascepa [Icosapent Ethyl (Epa Ethyl Ester) (Fish)] Hives and Swelling   Aspir-81 [Aspirin] Other (See Comments)    Coagulation disorder, causes bruising    Gabapentin  Other (See Comments)    Cognitive/memory issues   Metformin And Related     indigestion   Mounjaro  [Tirzepatide ] Diarrhea    Epigastric pain    Peanut-Containing Drug Products     Tongue goes numb from peanuts   Statins Other (See Comments)    Joint pain   Topamax [Topiramate] Other (See Comments)    Cognitive issues    Voltaren [Diclofenac Sodium] Hives and Other (See Comments)   Lipitor [Atorvastatin]     Joint pain

## 2024-03-05 ENCOUNTER — Ambulatory Visit: Admitting: Family Medicine

## 2024-03-05 ENCOUNTER — Encounter: Payer: Self-pay | Admitting: Family Medicine

## 2024-03-05 VITALS — BP 126/82 | HR 69 | Resp 16 | Ht 65.0 in | Wt 287.7 lb

## 2024-03-05 DIAGNOSIS — E039 Hypothyroidism, unspecified: Secondary | ICD-10-CM

## 2024-03-05 DIAGNOSIS — G4733 Obstructive sleep apnea (adult) (pediatric): Secondary | ICD-10-CM | POA: Diagnosis not present

## 2024-03-05 DIAGNOSIS — G2581 Restless legs syndrome: Secondary | ICD-10-CM | POA: Diagnosis not present

## 2024-03-05 DIAGNOSIS — E669 Obesity, unspecified: Secondary | ICD-10-CM | POA: Diagnosis not present

## 2024-03-05 DIAGNOSIS — G3184 Mild cognitive impairment, so stated: Secondary | ICD-10-CM

## 2024-03-05 DIAGNOSIS — N1831 Chronic kidney disease, stage 3a: Secondary | ICD-10-CM

## 2024-03-05 DIAGNOSIS — K219 Gastro-esophageal reflux disease without esophagitis: Secondary | ICD-10-CM | POA: Diagnosis not present

## 2024-03-05 DIAGNOSIS — E1122 Type 2 diabetes mellitus with diabetic chronic kidney disease: Secondary | ICD-10-CM

## 2024-03-05 DIAGNOSIS — E785 Hyperlipidemia, unspecified: Secondary | ICD-10-CM

## 2024-03-05 DIAGNOSIS — E119 Type 2 diabetes mellitus without complications: Secondary | ICD-10-CM

## 2024-03-05 DIAGNOSIS — E1169 Type 2 diabetes mellitus with other specified complication: Secondary | ICD-10-CM | POA: Diagnosis not present

## 2024-03-05 DIAGNOSIS — R252 Cramp and spasm: Secondary | ICD-10-CM

## 2024-03-05 DIAGNOSIS — I10 Essential (primary) hypertension: Secondary | ICD-10-CM

## 2024-03-05 LAB — POCT GLYCOSYLATED HEMOGLOBIN (HGB A1C): Hemoglobin A1C: 6.3 % — AB (ref 4.0–5.6)

## 2024-03-05 MED ORDER — OLMESARTAN MEDOXOMIL-HCTZ 40-12.5 MG PO TABS: 1.0000 | ORAL_TABLET | Freq: Every day | ORAL | 1 refills | Status: AC

## 2024-03-05 MED ORDER — EZETIMIBE 10 MG PO TABS: 10.0000 mg | ORAL_TABLET | Freq: Every day | ORAL | 1 refills | Status: AC

## 2024-03-05 MED ORDER — OZEMPIC (0.25 OR 0.5 MG/DOSE) 2 MG/3ML ~~LOC~~ SOPN: 0.5000 mg | PEN_INJECTOR | SUBCUTANEOUS | 0 refills | Status: DC

## 2024-03-05 NOTE — Progress Notes (Signed)
 Name: Sarah Gordon   MRN: [id number]    DOB: 05-10-1950   Date:03/05/2024       Progress Note  Subjective  Chief Complaint  Chief Complaint  Patient presents with   Medical Management of Chronic Issues   Discussed the use of AI scribe software for clinical note transcription with the patient, who gave verbal consent to proceed.  History of Present Illness Sarah Gordon is a 73 year old female with depression and seasonal affective disorder who presents for a routine follow-up.  Her depression has been worsening, particularly during this time of year. Significant grief related to the recent anniversaries of her brother's and sister's deaths has compounded her depressive symptoms. She is currently taking Wellbutrin  150 mg, duloxetine  60 mg, and hydroxyzine  as needed for anxiety. She also takes protopam 50 mg for sleep.  She has mild cognitive dysfunction and is concerned about her memory, especially given her family history of dementia. Her brother had prostate cancer that metastasized to his colon, and her sister had Alzheimer's disease. She is scheduled to see a psychiatrist, Dr. Coby ,  at the end of the month and is currently taking Aricept for cognitive dysfunction.  She has been experiencing leg cramps, which have improved with Requip  2 mg and pregabalin 25 mg twice a day. The cramps are less frequent and severe than before. She is waiting to have NCS done   She uses a CPAP machine for sleep apnea but has not been consistent with its use. A different mask and adjusted pressure were provided to improve tolerance.  Her blood pressure is managed with metoprolol  XL 25 mg and Benicar  HCTZ 40/12.5 mg. She also takes Zetia  for dyslipidemia and Synthroid , which was recently adjusted to 112 mcg due to elevated TSH   She has a history of obesity, hypertension, and diabetes syndrome. Her A1c was 6.3 in March, and she is not currently on medication for diabetes. She has gained about 8  pounds recently and is concerned about her weight, which affects her mobility and other health conditions. She has episodes of polydipsia and polyuria  She reports a cyst or lump in her abdomen that is tender to touch and occasionally causes discomfort. It is described as 'off and on' painful and sometimes feels like a cramp.    Patient Active Problem List   Diagnosis Date Noted   Near syncope 09/04/2023   Calculus of gallbladder with acute cholecystitis without obstruction 05/24/2023   Background diabetic retinopathy (HCC) 01/03/2023   Varicose veins with inflammation 07/16/2022   Cognitive complaints 10/27/2021   Polyp of colon    Lumbar radiculopathy 11/06/2020   Ventricular flutter (HCC) 12/06/2018   MDD (major depressive disorder), recurrent episode, mild 09/04/2018   DDD (degenerative disc disease), cervical 01/23/2016   Degenerative joint disease of left acromioclavicular joint 01/23/2016   Recurrent oral herpes simplex infection 09/01/2015   Irritable bowel syndrome with diarrhea 08/07/2015   Statin intolerance 02/28/2015   Asthma, intermittent 09/10/2014   Controlled type 2 diabetes with neuropathy (HCC) 09/10/2014   Fibromyalgia 09/10/2014   Obstructive sleep apnea 09/10/2014   Hyperlipidemia 12/29/2012   Essential hypertension 12/29/2012   Morbid obesity (HCC) 12/29/2012   Arthritis due to gout 12/13/2007   Acid reflux 10/24/2006   Adult hypothyroidism 10/24/2006   Localized osteoarthrosis 10/24/2006    Past Surgical History:  Procedure Laterality Date   BREAST BIOPSY Left    benign   CATARACT EXTRACTION W/PHACO Left 01/18/2023   Procedure:  CATARACT EXTRACTION PHACO AND INTRAOCULAR LENS PLACEMENT (IOC) LEFT DIABETIC  CLAREON VIVITY TORIC LENS;  Surgeon: Jaye Fallow, MD;  Location: [facility];  Service: Ophthalmology;  Laterality: Left;  4.47 0:31.9   CATARACT EXTRACTION W/PHACO Right 02/01/2023   Procedure: CATARACT EXTRACTION PHACO AND INTRAOCULAR  LENS PLACEMENT (IOC) RIGHT DIABETIC  CLAREON VIVITY TORIC LENS 3.68 00:28.9;  Surgeon: Jaye Fallow, MD;  Location: [facility];  Service: Ophthalmology;  Laterality: Right;   CHOLECYSTECTOMY     COLONOSCOPY     Dr Desiderio   COLONOSCOPY WITH PROPOFOL  N/A 11/19/2015   Procedure: COLONOSCOPY WITH PROPOFOL ;  Surgeon: Louanne KANDICE Muse, MD;  Location: [facility] END[facility]COPY;  Service: Endoscopy;  Laterality: N/A;   COLONOSCOPY WITH PROPOFOL  N/A 06/18/2021   Procedure: COLONOSCOPY WITH PROPOFOL ;  Surgeon: Unk Corinn Skiff, MD;  Location: [facility] END[facility]COPY;  Service: Gastroenterology;  Laterality: N/A;   ESOPHAGOGASTRODUODENOSCOPY N/A 08/20/2014   Procedure: ESOPHAGOGASTRODUODENOSCOPY (EGD);  Surgeon: Louanne KANDICE Muse, MD;  Location: [facility] ENDOSCOPY;  Service: Endoscopy;  Laterality: N/A;   ESOPHAGOGASTRODUODENOSCOPY (EGD) WITH PROPOFOL  N/A 06/18/2021   Procedure: ESOPHAGOGASTRODUODENOSCOPY (EGD) WITH PROPOFOL ;  Surgeon: Unk Corinn Skiff, MD;  Location: [facility] ENDOSCOPY;  Service: Gastroenterology;  Laterality: N/A;   EYE SURGERY     Cataract removal   FOOT SURGERY Left    heel spur   HERNIA REPAIR     umbilical   NASAL SINUS SURGERY  1980s   UMBILICAL HERNIA REPAIR      Family History  Problem Relation Age of Onset   Hypertension Mother    Hyperlipidemia Mother    Diabetes Mother    Heart disease Mother    Stroke Mother    Varicose Veins Mother    Obesity Mother    Alcohol abuse Father    Lupus Sister    Arthritis Sister    Depression Sister    Obesity Sister    Dementia Sister 26       Alzheimer's dementia   Asthma Sister    Depression Sister    Hypertension Sister    Miscarriages / Stillbirths Sister    Varicose Veins Sister    Depression Sister    Arthritis Sister    Heart disease Sister    Hyperlipidemia Sister    Hypertension Sister    Miscarriages / Stillbirths Sister    Varicose Veins Sister    Dementia Sister    Dementia Sister        due to head  trauma   Miscarriages / Stillbirths Sister    Arthritis Sister    Depression Sister    Heart disease Sister    Hyperlipidemia Sister    Hypertension Sister    Stroke Sister    Heart disease Brother 62       CABG x 3    Heart attack Brother 67   Hyperlipidemia Brother    Hypertension Brother    Diabetes Brother    Hypertension Brother    Obesity Brother    Asthma Brother    Depression Brother    Hyperlipidemia Brother    Alcohol abuse Brother    Heart disease Brother    Stroke Brother    Varicose Veins Brother    Early death Brother    Stroke Brother    Arthritis Brother    Depression Brother    Hyperlipidemia Brother    Hypertension Brother    Stroke Maternal Grandfather    Breast cancer Neg Hx     Social History   Tobacco  Use   Smoking status: Never    Passive exposure: Never   Smokeless tobacco: Never  Substance Use Topics   Alcohol use: No    Current Medications[1]  Allergies[2]  I personally reviewed active problem list, medication list, allergies, family history with the patient/caregiver today.   ROS  Ten systems reviewed and is negative except as mentioned in HPI    Objective Physical Exam  CONSTITUTIONAL: Patient appears well-developed and well-nourished. No distress. HEENT: Head atraumatic, normocephalic, neck supple. CARDIOVASCULAR: Normal rate, regular rhythm and normal heart sounds. No murmur heard. No BLE edema. PULMONARY: Effort normal and breath sounds normal. No respiratory distress. ABDOMINAL: Abdomen normal, palpable fatty tissue. There is no tenderness or distention. Scar from previous laparoscopic surgery  MUSCULOSKELETAL: uses a walker, slow gait  PSYCHIATRIC: Patient has a normal mood and affect. Behavior is normal. Judgment and thought content normal.  Vitals:   03/05/24 0929  BP: 126/82  Pulse: 69  Resp: 16  SpO2: 98%  Weight: 287 lb 11.2 oz (130.5 kg)  Height: 5' 5 (1.651 m)    Body mass index is 47.88  kg/m.  Recent Results (from the past 2160 hours)  Basic metabolic panel     Status: Abnormal   Collection Time: 12/14/23  6:04 PM  Result Value Ref Range   Sodium 136 135 - 145 mmol/L   Potassium 4.1 3.5 - 5.1 mmol/L   Chloride 102 98 - 111 mmol/L   CO2 25 22 - 32 mmol/L   Glucose, Bld 184 (H) 70 - 99 mg/dL    Comment: Glucose reference range applies only to samples taken after fasting for at least 8 hours.   BUN 25 (H) 8 - 23 mg/dL   Creatinine, Ser 8.86 (H) 0.44 - 1.00 mg/dL   Calcium  8.8 (L) 8.9 - 10.3 mg/dL   GFR, Estimated 52 (L) >60 mL/min    Comment: (NOTE) Calculated using the CKD-EPI Creatinine Equation (2021)    Anion gap 9 5 - 15    Comment: Performed at [facility], [address] [place], [place] [address]  CBC     Status: Abnormal   Collection Time: 12/14/23  6:04 PM  Result Value Ref Range   WBC 6.2 4.0 - 10.5 K/uL   RBC 3.79 (L) 3.87 - 5.11 MIL/uL   Hemoglobin 12.0 12.0 - 15.0 g/dL   HCT 64.0 (L) 63.9 - 53.9 %   MCV 94.7 80.0 - 100.0 fL   MCH 31.7 26.0 - 34.0 pg   MCHC 33.4 30.0 - 36.0 g/dL   RDW 86.9 88.4 - 84.4 %   Platelets 257 150 - 400 K/uL   nRBC 0.0 0.0 - 0.2 %    Comment: Performed at [facility], [address] [place], [place] [address]  Troponin I (High Sensitivity)     Status: None   Collection Time: 12/14/23  6:04 PM  Result Value Ref Range   Troponin I (High Sensitivity) 5 <18 ng/L    Comment: (NOTE) Elevated high sensitivity troponin I (hsTnI) values and significant  changes across serial measurements may suggest ACS but many other  chronic and acute conditions are known to elevate hsTnI results.  Refer to the Links section for chest pain algorithms and additional  guidance. Performed at [facility], [address] [place], [place] [address]   Troponin I (High Sensitivity)     Status: None   Collection Time: 12/14/23  9:03 PM  Result Value Ref Range   Troponin  I (High Sensitivity) 4 <18 ng/L     Comment: (NOTE) Elevated high sensitivity troponin I (hsTnI) values and significant  changes across serial measurements may suggest ACS but many other  chronic and acute conditions are known to elevate hsTnI results.  Refer to the Links section for chest pain algorithms and additional  guidance. Performed at [facility], [address] [place], [place] [address]   Basic metabolic panel     Status: Abnormal   Collection Time: 01/06/24  5:05 PM  Result Value Ref Range   Sodium 140 135 - 145 mmol/L   Potassium 3.8 3.5 - 5.1 mmol/L   Chloride 107 98 - 111 mmol/L   CO2 22 22 - 32 mmol/L   Glucose, Bld 235 (H) 70 - 99 mg/dL    Comment: Glucose reference range applies only to samples taken after fasting for at least 8 hours.   BUN 33 (H) 8 - 23 mg/dL   Creatinine, Ser 8.81 (H) 0.44 - 1.00 mg/dL   Calcium  8.8 (L) 8.9 - 10.3 mg/dL   GFR, Estimated 50 (L) >60 mL/min    Comment: (NOTE) Calculated using the CKD-EPI Creatinine Equation (2021)    Anion gap 11 5 - 15    Comment: Performed at [facility], [address] [place], [place] [address]  CBC     Status: Abnormal   Collection Time: 01/06/24  5:05 PM  Result Value Ref Range   WBC 7.8 4.0 - 10.5 K/uL   RBC 3.84 (L) 3.87 - 5.11 MIL/uL   Hemoglobin 12.3 12.0 - 15.0 g/dL   HCT 63.3 63.9 - 53.9 %   MCV 95.3 80.0 - 100.0 fL   MCH 32.0 26.0 - 34.0 pg   MCHC 33.6 30.0 - 36.0 g/dL   RDW 86.3 88.4 - 84.4 %   Platelets 287 150 - 400 K/uL   nRBC 0.0 0.0 - 0.2 %    Comment: Performed at [facility], [address] [place], [place] [address]  Troponin I (High Sensitivity)     Status: None   Collection Time: 01/06/24  5:05 PM  Result Value Ref Range   Troponin I (High Sensitivity) 6 <18 ng/L    Comment: (NOTE) Elevated high sensitivity troponin I (hsTnI) values and significant  changes across serial measurements may suggest ACS but many other  chronic and acute conditions are known to elevate hsTnI  results.  Refer to the Links section for chest pain algorithms and additional  guidance. Performed at [facility], [address] [place], [place] [address]   Troponin I (High Sensitivity)     Status: None   Collection Time: 01/06/24  7:00 PM  Result Value Ref Range   Troponin I (High Sensitivity) 6 <18 ng/L    Comment: (NOTE) Elevated high sensitivity troponin I (hsTnI) values and significant  changes across serial measurements may suggest ACS but many other  chronic and acute conditions are known to elevate hsTnI results.  Refer to the Links section for chest pain algorithms and additional  guidance. Performed at [facility], [address] [place], [place] [address]   CBC     Status: None   Collection Time: 01/12/24  3:30 PM  Result Value Ref Range   WBC 9.0 3.4 - 10.8 x10E3/uL   RBC 4.06 3.77 - 5.28 x10E6/uL   Hemoglobin 13.0 11.1 - 15.9 g/dL   Hematocrit 60.7 65.9 - 46.6 %   MCV 97 79 - 97 fL  MCH 32.0 26.6 - 33.0 pg   MCHC 33.2 31.5 - 35.7 g/dL   RDW 85.9 88.2 - 84.5 %   Platelets 324 150 - 450 x10E3/uL  Iron, TIBC and Ferritin Panel     Status: Abnormal   Collection Time: 01/12/24  3:30 PM  Result Value Ref Range   Total Iron Binding Capacity 324 250 - 450 ug/dL   UIBC 739 881 - 630 ug/dL   Iron 64 27 - 860 ug/dL   Iron Saturation 20 15 - 55 %   Ferritin 154 (H) 15 - 150 ng/mL  Magnesium     Status: None   Collection Time: 01/12/24  3:30 PM  Result Value Ref Range   Magnesium 2.2 1.6 - 2.3 mg/dL  Comprehensive metabolic panel with GFR     Status: Abnormal   Collection Time: 01/12/24  3:30 PM  Result Value Ref Range   Glucose 115 (H) 70 - 99 mg/dL   BUN 30 (H) 8 - 27 mg/dL   Creatinine, Ser 8.78 (H) 0.57 - 1.00 mg/dL   eGFR 48 (L) >40 fO/fpw/8.26   BUN/Creatinine Ratio 25 12 - 28   Sodium 140 134 - 144 mmol/L   Potassium 4.6 3.5 - 5.2 mmol/L   Chloride 102 96 - 106 mmol/L   CO2 21 20 - 29 mmol/L   Calcium  8.9 8.7 - 10.3 mg/dL    Total Protein 6.3 6.0 - 8.5 g/dL   Albumin  3.9 3.9 - 4.9 g/dL   Globulin, Total 2.4 1.5 - 4.5 g/dL   Bilirubin Total 0.2 0.0 - 1.2 mg/dL   Alkaline Phosphatase 144 (H) 49 - 135 IU/L   AST 18 0 - 40 IU/L   ALT 19 0 - 32 IU/L  TSH     Status: Abnormal   Collection Time: 01/12/24  3:30 PM  Result Value Ref Range   TSH 5.660 (H) 0.450 - 4.500 uIU/mL  Vitamin B12     Status: None   Collection Time: 01/12/24  3:30 PM  Result Value Ref Range   Vitamin B-12 730 232 - 1,245 pg/mL  Cytology - PAP     Status: Abnormal   Collection Time: 01/19/24  9:53 AM  Result Value Ref Range   High risk HPV Negative    Adequacy      Satisfactory for evaluation; transformation zone component PRESENT.   Diagnosis - Atypical glandular cells, NOS (A)    Comment Normal Reference Range HPV - Negative   OPHTHALMOLOGY REPORT-SCANNED     Status: Abnormal   Collection Time: 02/08/24  9:02 AM  Result Value Ref Range   HM Diabetic Eye Exam Retinopathy (A) No Retinopathy    Comment: abst by him   A Comment    POCT glycosylated hemoglobin (Hb A1C)     Status: Abnormal   Collection Time: 03/05/24  9:41 AM  Result Value Ref Range   Hemoglobin A1C 6.3 (A) 4.0 - 5.6 %   HbA1c POC (<> result, manual entry)     HbA1c, POC (prediabetic range)     HbA1c, POC (controlled diabetic range)      Diabetic Foot Exam:     PHQ2/9:    03/05/2024    9:23 AM 01/12/2024    2:46 PM 12/16/2023    9:55 AM 11/03/2023    8:36 AM 06/16/2023   10:18 AM  Depression screen PHQ 2/9  Decreased Interest 2 1 2 1  0  Down, Depressed, Hopeless 2 1 2 1  0  PHQ -  2 Score 4 2 4 2  0  Altered sleeping 1 2 0 1 0  Tired, decreased energy 1 1 3 1  0  Change in appetite 1 1 2 1  0  Feeling bad or failure about yourself  0 1 0 0 0  Trouble concentrating 0 0 2 1 0  Moving slowly or fidgety/restless 0 0 0 0 0  Suicidal thoughts 0 0 0 0 0  PHQ-9 Score 7 7  11  6   0   Difficult doing work/chores Somewhat difficult Somewhat difficult Very  difficult Somewhat difficult Not difficult at all     Data saved with a previous flowsheet row definition    phq 9 is positive  Fall Risk:    03/05/2024    9:22 AM 01/12/2024    2:46 PM 12/16/2023    9:45 AM 11/03/2023    8:30 AM 06/16/2023   10:12 AM  Fall Risk   Falls in the past year? 0 1 1 0 1  Number falls in past yr: 0 1 0 0 0  Injury with Fall? 0 1  1  0  0   Risk for fall due to : No Fall Risks  Impaired balance/gait No Fall Risks Impaired balance/gait  Follow up Falls evaluation completed  Falls evaluation completed Falls evaluation completed Falls evaluation completed     Data saved with a previous flowsheet row definition     Assessment & Plan Major depressive disorder, recurrent Depression worsening, likely due to seasonal changes and bereavements. Current medications include Wellbutrin , duloxetine , and hydroxyzine . - Continue Wellbutrin  150 mg, duloxetine  60 mg, hydroxyzine  as needed. - Attend psychiatrist appointment with Dr. Magda for further evaluation.  Type 2 diabetes mellitus with stage 3a chronic kidney disease, hypertension, and morbid obesity Diabetes well-controlled with A1c of 6.3. Discussed GLP-1 agonist (Ozempic ) for weight loss and cardiovascular protection. Patient open to Ozempic  with husband's assistance for injections. - Start Ozempic  0.25 mg for 4 weeks, increase to 0.5 mg if tolerated. - Continue metoprolol  XL 25 mg, olmesartan , and CTZ 40/12.5 mg. - Monitor weight and diabetes control.  Obstructive sleep apnea Pulmonologist recommended resuming CPAP therapy. Discussed importance for cognitive function and health. - Resume CPAP therapy with new mask and pressure settings. - Aim for at least 4 hours of CPAP use per night.  Restless legs syndrome and nocturnal muscle cramps Symptoms improved with Requip  and pregabalin. Neurology appointment scheduled. - Continue Requip  2 mg and pregabalin 25 mg twice daily. - Attend neurology appointment for  further evaluation.  Adult hypothyroidism TSH slightly off in October. Current Synthroid  dose may be low, possibly contributing to leg cramps. - Increase Synthroid  to 112 mcg daily. - Recheck TSH in 4-6 weeks.  Gastroesophageal reflux disease Reflux symptoms improved post-cholecystectomy.  Mild cognitive impairment Concerns about memory loss and family history of dementia. On Aricept for cognitive dysfunction. - Continue Aricept for cognitive dysfunction.  General health maintenance Pap smear in October showed atypical glandular cells. Gynecology appointment scheduled. - Attend gynecology appointment for colposcopy.        [1]  Current Outpatient Medications:    acetaminophen  (TYLENOL ) 500 MG tablet, Take 1,000 mg by mouth every 6 (six) hours as needed for moderate pain (pain score 4-6)., Disp: , Rfl:    acyclovir  (ZOVIRAX ) 400 MG tablet, Take 1 tablet (400 mg total) by mouth 3 (three) times daily., Disp: 30 tablet, Rfl: 1   buPROPion  (WELLBUTRIN  XL) 150 MG 24 hr tablet, Take 1 tablet (150 mg total) by  mouth every morning., Disp: 90 tablet, Rfl: 1   donepezil (ARICEPT) 10 MG tablet, Take 10 mg by mouth daily., Disp: , Rfl:    DULoxetine  (CYMBALTA ) 60 MG capsule, Take 1 capsule (60 mg total) by mouth daily., Disp: 90 capsule, Rfl: 1   ezetimibe  (ZETIA ) 10 MG tablet, TAKE 1 TABLET BY MOUTH DAILY, Disp: 90 tablet, Rfl: 0   folic acid  (FOLVITE ) 800 MCG tablet, Take 800 mcg by mouth daily., Disp: , Rfl:    hydrOXYzine  (ATARAX ) 10 MG tablet, Take 1 tablet (10 mg total) by mouth 3 (three) times daily as needed for anxiety., Disp: 30 tablet, Rfl: 0   levothyroxine  (SYNTHROID ) 112 MCG tablet, Take 1 tablet (112 mcg total) by mouth daily., Disp: 30 tablet, Rfl: 3   lidocaine  (LIDODERM ) 5 %, Place 1 patch onto the skin daily., Disp: , Rfl:    metoprolol  succinate (TOPROL -XL) 25 MG 24 hr tablet, Take 1 tablet (25 mg total) by mouth daily., Disp: 90 tablet, Rfl: 1   montelukast  (SINGULAIR ) 10  MG tablet, Take 1 tablet (10 mg total) by mouth at bedtime., Disp: 90 tablet, Rfl: 1   Multiple Vitamin (MULTIVITAMIN) tablet, Take 1 tablet by mouth daily., Disp: , Rfl:    olmesartan -hydrochlorothiazide  (BENICAR  HCT) 40-12.5 MG tablet, Take 1 tablet by mouth daily., Disp: , Rfl:    omeprazole  (PRILOSEC) 20 MG capsule, TAKE 1 CAPSULE BY MOUTH 2 TIMES A DAY BEFORE A MEAL, Disp: 180 capsule, Rfl: 0   ondansetron  (ZOFRAN ) 4 MG tablet, Take 1 tablet (4 mg total) by mouth every 8 (eight) hours as needed for nausea or vomiting., Disp: 20 tablet, Rfl: 0   Polyethyl Glycol-Propyl Glycol (SYSTANE OP), Place 1 drop into both eyes daily as needed (dry eyes)., Disp: , Rfl:    pregabalin (LYRICA) 25 MG capsule, Take 25 mg by mouth 2 (two) times daily., Disp: , Rfl:    QUEtiapine  (SEROQUEL ) 50 MG tablet, Take 50 mg by mouth at bedtime., Disp: , Rfl:    rOPINIRole  (REQUIP ) 2 MG tablet, Take 2 mg by mouth. QID, Disp: , Rfl:    traMADol  (ULTRAM ) 50 MG tablet, Take 50 mg by mouth every 6 (six) hours as needed., Disp: , Rfl:  [2]  Allergies Allergen Reactions   Vascepa [Icosapent Ethyl (Epa Ethyl Ester) (Fish)] Hives and Swelling   Aspir-81 [Aspirin] Other (See Comments)    Coagulation disorder, causes bruising    Gabapentin  Other (See Comments)    Cognitive/memory issues   Metformin And Related     indigestion   Mounjaro  [Tirzepatide ] Diarrhea    Epigastric pain    Peanut-Containing Drug Products     Tongue goes numb from peanuts   Statins Other (See Comments)    Joint pain   Topamax [Topiramate] Other (See Comments)    Cognitive issues    Voltaren [Diclofenac Sodium] Hives and Other (See Comments)   Lipitor [Atorvastatin]     Joint pain

## 2024-03-06 ENCOUNTER — Ambulatory Visit: Payer: Self-pay | Admitting: Family Medicine

## 2024-03-06 LAB — TSH: TSH: 4.81 m[IU]/L — ABNORMAL HIGH (ref 0.40–4.50)

## 2024-03-07 ENCOUNTER — Encounter: Payer: Self-pay | Admitting: Family Medicine

## 2024-03-19 ENCOUNTER — Other Ambulatory Visit: Payer: Self-pay

## 2024-03-19 ENCOUNTER — Encounter: Payer: Self-pay | Admitting: Psychiatry

## 2024-03-19 ENCOUNTER — Ambulatory Visit: Payer: Self-pay | Admitting: Psychiatry

## 2024-03-19 ENCOUNTER — Other Ambulatory Visit: Payer: Self-pay | Admitting: Family Medicine

## 2024-03-19 VITALS — BP 139/88 | HR 83 | Temp 97.8°F | Ht 66.0 in | Wt 291.0 lb

## 2024-03-19 DIAGNOSIS — F419 Anxiety disorder, unspecified: Secondary | ICD-10-CM | POA: Insufficient documentation

## 2024-03-19 DIAGNOSIS — F331 Major depressive disorder, recurrent, moderate: Secondary | ICD-10-CM | POA: Insufficient documentation

## 2024-03-19 DIAGNOSIS — F09 Unspecified mental disorder due to known physiological condition: Secondary | ICD-10-CM

## 2024-03-19 DIAGNOSIS — G2581 Restless legs syndrome: Secondary | ICD-10-CM

## 2024-03-19 MED ORDER — BUPROPION HCL ER (XL) 300 MG PO TB24: 300.0000 mg | ORAL_TABLET | Freq: Every morning | ORAL | 0 refills | Status: AC

## 2024-03-19 NOTE — Progress Notes (Unsigned)
 " Psychiatric Initial Adult Assessment   Patient Identification: Sarah Gordon MRN:  [id number] Date of Evaluation:  03/19/2024 Referral Source: Dr.[name] Sowles Chief Complaint:   Chief Complaint  Patient presents with   Establish Care   Anxiety   Depression   Medication Refill   Visit Diagnosis:    ICD-10-CM   1. MDD (major depressive disorder), recurrent episode, moderate (HCC)  F33.1 buPROPion  (WELLBUTRIN  XL) 300 MG 24 hr tablet    2. Anxiety disorder, unspecified type  F41.9     3. Cognitive disorder  F09    Likely mild      Discussed the use of AI scribe software for clinical note transcription with the patient, who gave verbal consent to proceed.  History of Present Illness Sarah Gordon is a 73 year old Caucasian female, currently retired, married, lives in [place], has a history of depression, restless leg syndrome, hypothyroidism, hyperlipidemia, diabetes mellitus type 2, lumbar radiculopathy, degenerative disc disease, irritable bowel syndrome, obstructive sleep apnea now on CPAP, essential hypertension, gastroesophageal reflux disease, fibromyalgia, asthma, history of renal function impairment, was evaluated in office today presented to establish care.  Patient today is accompanied by her daughter Sarah Gordon who provided collateral information.  She has been struggling with depression since the past several years.  Her persistent depressive symptoms have worsened recently, especially during the holiday season. She describes feeling 'melancholy' and identifies this time of year as challenging due to the loss of several family members, including her parents, 2 brothers, and a sister who died in 24-Aug-2023. She reports that these losses have intensified her grief and contributed to her current mood. She has become more socially withdrawn, and she prefers to stay home and avoid interactions, which she notes is not typical for her. Her husband has expressed concern about her  missing church services and becoming more isolated. She continues to struggle with motivation and requires significant effort to leave the house.  Low energy and difficulty with motivation affect her daily activities, and she often needs to give herself a pep talk to engage in tasks. She spends time on her iPad working on family history projects as a coping mechanism for grief and emotional distress. She mostly maintains her appetite, though she sometimes lacks interest in eating. She denies any history of manic or hypomanic episodes and states that she is usually a happy person but has not experienced elevated mood or excessive energy. She denies any history of hallucinations, paranoia, or psychotic symptoms.  Her anxiety symptoms include episodes where she feels unable to get enough air, followed by tears and jitters. She can usually talk herself through these episodes, but sometimes she experiences 2 to 3 days of wanting to remain at home and avoid contact with others. She clarifies that this withdrawal is not due to anxiety but rather an attempt to refocus her mind on the present.She describes childhood exposure to her father's alcoholism and associated fear due to his potential for violence when intoxicated, though she was not physically harmed. She denies history of sexual or emotional abuse.  She began experiencing memory loss over a year ago, and she describes missing appointments and difficulty keeping track of her schedule. Her husband helps her stay organized, and she relies on checklists and reminders. She currently takes donepezil (Aricept) for memory loss and notes some improvement.  She is currently under the care of neurology,Dr.Potter.  She appeared to be alert, oriented to person place time situation.  3 word memory immediate 3  out of 3, after 5 minutes 2 out of 3.  Attention and focus seem to be good in session, was able to do subtraction well.  Her current medications include bupropion   (Wellbutrin ) 150 mg in the morning for depression, duloxetine  (Cymbalta ) 60 mg daily for depression, and quetiapine  (Seroquel ) 50 mg for mood symptoms. She has taken Wellbutrin  for several years and Cymbalta  for less than a year. She reports some benefit from these medications but continues to experience depressive symptoms and social withdrawal. She also takes hydroxyzine  10 mg up to 3 times daily as needed for anxiety but does not use it regularly and reports minimal benefit. She currently takes pregabalin (Lyrica) twice daily, initiated by neurology.  She takes ropinirole  for restless leg symptoms and melatonin at night, which has improved her sleep quality. She describes experiencing fine tremors and leg jumping, which she attributes to restless leg syndrome but is unsure if these symptoms are related to Seroquel .  She uses a CPAP device nightly for obstructive sleep apnea and states that a recent change in mask and device programming has improved her sleep and energy levels. She reports that improved sleep has helped her feel more rested in the morning and has contributed to better overall functioning.  She is currently under the care of pulmonology.    Associated Signs/Symptoms: Depression Symptoms:  depressed mood, anhedonia, insomnia, anxiety, (Hypo) Manic Symptoms:  Denies Anxiety Symptoms:  Panic Symptoms, Psychotic Symptoms:  Denies PTSD Symptoms: Negative  Past Psychiatric History: She denies prior hospitalizations and has never seen a psychiatrist before.  Medications were being prescribed by primary care provider as well as neurologist.  She denies any history of suicide attempts or self-harm behaviors. She states she has not engaged in past psychotherapy.   Previous Psychotropic Medications: Yes multiple including Seroquel , Wellbutrin , duloxetine   Substance Abuse History in the last 12 months:  No.   Consequences of Substance Abuse: Negative  Past Medical History:  Past  Medical History:  Diagnosis Date   Allergy    Anxiety 95   Asthma    Cataract    Depression 95   Esophageal reflux    Gouty arthropathy, unspecified    Herpes simplex without mention of complication    Irritable bowel syndrome    Localized osteoarthrosis not specified whether primary or secondary, unspecified site    Migraine, unspecified, without mention of intractable migraine without mention of status migrainosus    Mild cognitive impairment    Mixed hyperlipidemia    Myalgia and myositis, unspecified    Obstructive sleep apnea (adult) (pediatric)    Palpitations    Sleep apnea    No CPAP   Type II or unspecified type diabetes mellitus without mention of complication, not stated as uncontrolled    Diet controlled   Umbilical hernia without mention of obstruction or gangrene    Unspecified essential hypertension    Unspecified hypothyroidism     Past Surgical History:  Procedure Laterality Date   BREAST BIOPSY Left    benign   CATARACT EXTRACTION W/PHACO Left 01/18/2023   Procedure: CATARACT EXTRACTION PHACO AND INTRAOCULAR LENS PLACEMENT (IOC) LEFT DIABETIC  CLAREON VIVITY TORIC LENS;  Surgeon: Jaye Fallow, MD;  Location: [facility];  Service: Ophthalmology;  Laterality: Left;  4.47 0:31.9   CATARACT EXTRACTION W/PHACO Right 02/01/2023   Procedure: CATARACT EXTRACTION PHACO AND INTRAOCULAR LENS PLACEMENT (IOC) RIGHT DIABETIC  CLAREON VIVITY TORIC LENS 3.68 00:28.9;  Surgeon: Jaye Fallow, MD;  Location: [facility];  Service: Ophthalmology;  Laterality: Right;   CHOLECYSTECTOMY     COLONOSCOPY     Dr Desiderio   COLONOSCOPY WITH PROPOFOL  N/A 11/19/2015   Procedure: COLONOSCOPY WITH PROPOFOL ;  Surgeon: Louanne KANDICE Muse, MD;  Location: [facility];  Service: Endoscopy;  Laterality: N/A;   COLONOSCOPY WITH PROPOFOL  N/A 06/18/2021   Procedure: COLONOSCOPY WITH PROPOFOL ;  Surgeon: Unk Corinn Skiff, MD;  Location: [facility];  Service:  Gastroenterology;  Laterality: N/A;   ESOPHAGOGASTRODUODENOSCOPY N/A 08/20/2014   Procedure: ESOPHAGOGASTRODUODENOSCOPY (EGD);  Surgeon: Louanne KANDICE Muse, MD;  Location: [facility] END[facility]COPY;  Service: Endoscopy;  Laterality: N/A;   ESOPHAGOGASTRODUODENOSCOPY (EGD) WITH PROPOFOL  N/A 06/18/2021   Procedure: ESOPHAGOGASTRODUODENOSCOPY (EGD) WITH PROPOFOL ;  Surgeon: Unk Corinn Skiff, MD;  Location: [facility] ENDOSCOPY;  Service: Gastroenterology;  Laterality: N/A;   EYE SURGERY     Cataract removal   FOOT SURGERY Left    heel spur   HERNIA REPAIR     umbilical   NASAL SINUS SURGERY  1980s   UMBILICAL HERNIA REPAIR      Family Psychiatric History: As noted below  Family History:  Family History  Problem Relation Age of Onset   Hypertension Mother    Hyperlipidemia Mother    Diabetes Mother    Heart disease Mother    Stroke Mother    Varicose Veins Mother    Obesity Mother    Alcohol abuse Father    Bipolar disorder Sister    Lupus Sister    Arthritis Sister    Depression Sister    Obesity Sister    Dementia Sister 19       Alzheimer's dementia   Asthma Sister    Depression Sister    Hypertension Sister    Miscarriages / Stillbirths Sister    Varicose Veins Sister    Depression Sister    Arthritis Sister    Heart disease Sister    Hyperlipidemia Sister    Hypertension Sister    Miscarriages / Stillbirths Sister    Varicose Veins Sister    Dementia Sister    Dementia Sister        due to head trauma   Miscarriages / Stillbirths Sister    Arthritis Sister    Depression Sister    Heart disease Sister    Hyperlipidemia Sister    Hypertension Sister    Stroke Sister    Heart disease Brother 47       CABG x 3    Heart attack Brother 31   Hyperlipidemia Brother    Hypertension Brother    Bipolar disorder Brother    Diabetes Brother    Hypertension Brother    Obesity Brother    Asthma Brother    Depression Brother    Hyperlipidemia Brother    Alcohol abuse Brother     Heart disease Brother    Stroke Brother    Varicose Veins Brother    Early death Brother    Stroke Brother    Arthritis Brother    Depression Brother    Hyperlipidemia Brother    Hypertension Brother    Stroke Maternal Grandfather    Mental illness Paternal Grandmother    Breast cancer Neg Hx     Social History:   Social History   Socioeconomic History   Marital status: Married    Spouse name: Sarah Gordon    Number of children: 1   Years of education: Not on file   Highest education level: Some college, no degree  Occupational History  Comment: works part time at [employer]  Tobacco Use   Smoking status: Never    Passive exposure: Never   Smokeless tobacco: Never  Vaping Use   Vaping status: Never Used  Substance and Sexual Activity   Alcohol use: No   Drug use: No   Sexual activity: Not Currently    Birth control/protection: Post-menopausal  Other Topics Concern   Not on file  Social History Narrative   Not on file   Social Drivers of Health   Tobacco Use: Low Risk (03/19/2024)   Patient History    Smoking Tobacco Use: Never    Smokeless Tobacco Use: Never    Passive Exposure: Never  Financial Resource Strain: Low Risk (01/12/2024)   Overall Financial Resource Strain (CARDIA)    Difficulty of Paying Living Expenses: Not very hard  Recent Concern: Financial Resource Strain - Medium Risk (10/28/2023)   Overall Financial Resource Strain (CARDIA)    Difficulty of Paying Living Expenses: Somewhat hard  Food Insecurity: Food Insecurity Present (01/12/2024)   Epic    Worried About Programme Researcher, Broadcasting/film/video in the Last Year: Never true    Ran Out of Food in the Last Year: Sometimes true  Transportation Needs: Unmet Transportation Needs (01/12/2024)   Epic    Lack of Transportation (Medical): No    Lack of Transportation (Non-Medical): Yes  Physical Activity: Insufficiently Active (01/12/2024)   Exercise Vital Sign    Days of Exercise per Week: 2 days    Minutes of  Exercise per Session: 20 min  Stress: No Stress Concern Present (01/12/2024)   [employer] of Occupational Health - Occupational Stress Questionnaire    Feeling of Stress: Only a little  Social Connections: Moderately Integrated (01/12/2024)   Social Connection and Isolation Panel    Frequency of Communication with Friends and Family: More than three times a week    Frequency of Social Gatherings with Friends and Family: Twice a week    Attends Religious Services: Never    Database Administrator or Organizations: Yes    Attends Engineer, Structural: More than 4 times per year    Marital Status: Married  Depression (PHQ2-9): High Risk (03/20/2024)   Depression (PHQ2-9)    PHQ-2 Score: 11  Alcohol Screen: Low Risk (06/16/2023)   Alcohol Screen    Last Alcohol Screening Score (AUDIT): 0  Housing: High Risk (01/12/2024)   Epic    Unable to Pay for Housing in the Last Year: Yes    Number of Times Moved in the Last Year: Not on file    Homeless in the Last Year: No  Utilities: Not At Risk (09/04/2023)   Epic    Threatened with loss of utilities: No  Health Literacy: Not on file    Additional Social History: She was born in [place] .  She was raised by both parents.  She reports her father was an alcoholic which may have contributed to emotional trauma otherwise denies any history of sexual or physical abuse.  She was the youngest of 9 siblings.  She completed high school and 2 years of college. 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 She currently helps care for a special needs granddaughter. She is married and lives with her spouse in [place]. She has 1 biological child and considers 6 others as godchildren with close  family relationships. She remains active in her church community and identifies as religious. She uses a walker  for mobility and limits physical activity to short distances.  She denies access to firearms.  She denies legal problems.  Allergies:  Allergies[1]  Metabolic Disorder Labs: Lab Results  Component Value Date   HGBA1C 6.3 (A) 03/05/2024   MPG 139.85 05/25/2023   MPG 140 08/20/2022   No results found for: PROLACTIN Lab Results  Component Value Date   CHOL 274 (H) 11/03/2023   TRIG 230 (H) 11/03/2023   HDL 43 (L) 11/03/2023   CHOLHDL 6.4 (H) 11/03/2023   VLDL 29 10/23/2015   LDLCALC 188 (H) 11/03/2023   LDLCALC 147 (H) 08/20/2022   Lab Results  Component Value Date   TSH 4.81 (H) 03/05/2024    Therapeutic Level Labs: No results found for: LITHIUM No results found for: CBMZ No results found for: VALPROATE  Current Medications: Current Outpatient Medications  Medication Sig Dispense Refill   acetaminophen  (TYLENOL ) 500 MG tablet Take 1,000 mg by mouth every 6 (six) hours as needed for moderate pain (pain score 4-6).     acyclovir  (ZOVIRAX ) 400 MG tablet Take 1 tablet (400 mg total) by mouth 3 (three) times daily. 30 tablet 1   buPROPion  (WELLBUTRIN  XL) 300 MG 24 hr tablet Take 1 tablet (300 mg total) by mouth in the morning. 90 tablet 0   donepezil (ARICEPT) 10 MG tablet Take 10 mg by mouth daily.     DULoxetine  (CYMBALTA ) 60 MG capsule Take 1 capsule (60 mg total) by mouth daily. 90 capsule 1   ezetimibe  (ZETIA ) 10 MG tablet Take 1 tablet (10 mg total) by mouth daily. 90 tablet 1   folic acid  (FOLVITE ) 800 MCG tablet Take 800 mcg by mouth daily.     hydrOXYzine  (ATARAX ) 10 MG tablet Take 1 tablet (10 mg total) by mouth 3 (three) times daily as needed for anxiety. 30 tablet 0   levothyroxine  (SYNTHROID ) 112 MCG tablet Take 1 tablet (112 mcg total) by mouth daily. 30 tablet 3   lidocaine  (LIDODERM ) 5 % Place 1 patch onto the skin daily.     metoprolol  succinate (TOPROL -XL) 25 MG 24 hr tablet Take 1 tablet (25 mg total) by mouth daily. 90 tablet 1   montelukast   (SINGULAIR ) 10 MG tablet Take 1 tablet (10 mg total) by mouth at bedtime. 90 tablet 1   Multiple Vitamin (MULTIVITAMIN) tablet Take 1 tablet by mouth daily.     olmesartan -hydrochlorothiazide  (BENICAR  HCT) 40-12.5 MG tablet Take 1 tablet by mouth daily. 90 tablet 1   omeprazole  (PRILOSEC) 20 MG capsule TAKE 1 CAPSULE BY MOUTH 2 TIMES A DAY BEFORE A MEAL 180 capsule 0   ondansetron  (ZOFRAN ) 4 MG tablet Take 1 tablet (4 mg total) by mouth every 8 (eight) hours as needed for nausea or vomiting. 20 tablet 0   pregabalin (LYRICA) 25 MG capsule Take 25 mg by mouth 2 (two) times daily.     QUEtiapine  (SEROQUEL ) 50 MG tablet Take 50 mg by mouth at bedtime.     rOPINIRole  (REQUIP ) 2 MG tablet Take 2 mg by mouth. QID     traMADol  (ULTRAM ) 50 MG tablet Take 50 mg by mouth every 6 (six) hours as needed.     Polyethyl Glycol-Propyl Glycol (SYSTANE OP) Place 1 drop into both eyes daily as needed (dry eyes).     Semaglutide ,0.25 or 0.5MG /DOS, (OZEMPIC , 0.25 OR 0.5 MG/DOSE,) 2 MG/3ML SOPN Inject 0.5 mg into the  skin once a week. (Patient not taking: Reported on 03/19/2024) 3 mL 0   No current facility-administered medications for this visit.    Musculoskeletal: Strength & Muscle Tone: within normal limits Gait & Station: walks with walker Patient leans: Backward  Psychiatric Specialty Exam: Review of Systems  Psychiatric/Behavioral:  Positive for dysphoric mood. The patient is nervous/anxious.     Blood pressure 139/88, pulse 83, temperature 97.8 F (36.6 C), temperature source Temporal, height 5' 6 (1.676 m), weight 291 lb (132 kg).Body mass index is 46.97 kg/m.  General Appearance: Casual  Eye Contact:  Fair  Speech:  Clear and Coherent  Volume:  Normal  Mood:  Anxious and Depressed  Affect:  Congruent  Thought Process:  Goal Directed and Descriptions of Associations: Intact  Orientation:  Full (Time, Place, and Person)  Thought Content:  Logical  Suicidal Thoughts:  No  Homicidal Thoughts:   No  Memory:  Immediate;   Fair Recent;   Fair Remote;   Fair reports short-term memory problems  Judgement:  Fair  Insight:  Fair  Psychomotor Activity:  Normal  Concentration:  Concentration: Fair and Attention Span: Fair  Recall:  Limited  Fund of Knowledge:Fair  Language: Fair  Akathisia:  No  Handed:  Right  AIMS (if indicated):  not done  Assets:  Manufacturing Systems Engineer Desire for Improvement Housing Intimacy Transportation  ADL's:  Intact  Cognition: WNL  Sleep:  improving   Screenings: GAD-7    Loss Adjuster, Chartered Office Visit from 03/19/2024 in [facility] Office Visit from 03/05/2024 in [facility] Office Visit from 01/12/2024 in [facility] Office Visit from 11/03/2023 in [facility] Office Visit from 05/09/2023 in [facility]  Total GAD-7 Score 5 11 7 7 7    Mini-Mental    Flowsheet Row Office Visit from 10/27/2021 in [facility]  Total Score (max 30 points ) 30   PHQ2-9    Flowsheet Row Office Visit from 03/19/2024 in [facility] Office Visit from 03/05/2024 in [facility] Office Visit from 01/12/2024 in [facility] Office Visit from 12/16/2023 in [facility] Office Visit from 11/03/2023 in [facility]  PHQ-2 Total Score 4 4 2 4 2   PHQ-9 Total Score 11 7 7 11 6    Flowsheet Row Office Visit from 03/19/2024 in [facility] ED from 01/06/2024 in [facility] at [facility] ED from 12/14/2023 in [facility] Emergency Department at [facility]  C-SSRS RISK CATEGORY No Risk No Risk No Risk   Assessment and Plan:Sarah Gordon is a 73 year old Caucasian female who presented to establish care.   Discussed assessment and plan as noted below.  Assessment & Plan Major depressive disorder, recurrent moderate-unstable Recurrent major depressive disorder with worsening symptoms, especially in winter, characterized by melancholy, social withdrawal, and difficulty engaging in activities. Current medications include bupropion , duloxetine , and quetiapine . No history of suicide attempts. Family history of depression and bipolar disorder. Sleep has recently improved with CPAP adjustment. Discussed potential medication side effects, including interactions and serotonin syndrome risk.  Increased Bupropion  to 300 mg daily.  Referred to psychotherapy for grief and coping strategies.  Monitor for Bupropion  side effects as well as drug-drug interaction with medications like metoprolol . Continue Cymbalta  as prescribed Continue Seroquel  50 mg at bedtime for now. Will  consider reducing or tapering off Seroquel  if restless leg symptoms not controlled.  Anxiety disorder unspecified-unstable Symptoms include difficulty breathing and jitters, exacerbated by social withdrawal and grief. Discussed potential medication side effects and interactions. Continue Hydroxyzine  10 mg 3 times a day as needed. Monitor for increased anxiety with medication adjustments. Continue Cymbalta  60 mg daily  Cognitive disorder likely mild-currently under the care of neurology.   I have reviewed labs including TSH dated 03/06/2019 25-4.81, slightly elevated.  I have also reviewed CBC dated 01/06/2024-within normal limits.  BUN dated 01/12/2024-30 slightly elevated and creatinine 1.21 slightly elevated-chronic.  Will monitor closely.  Collateral information obtained from daughter who was present in session as noted above.  Follow-up Follow-up in clinic in 3 to 4 weeks or sooner if needed.  Collaboration of Care: Other I have reviewed notes per neurologyMs. Lauraine Gordon dated 02/06/2024-patient with mild cognitive impairment  currently on Donepezil.  I have reviewed notes per Dr.Sowles primary care provider as follows pulmonology dated 03/01/2024.  Patient/Guardian was advised Release of Information must be obtained prior to any record release in order to collaborate their care with an outside provider. Patient/Guardian was advised if they have not already done so to contact the registration department to sign all necessary forms in order for us  to release information regarding their care.   Consent: Patient/Guardian gives verbal consent for treatment and assignment of benefits for services provided during this visit. Patient/Guardian expressed understanding and agreed to proceed.  This note was generated in part or whole with voice recognition software. Voice recognition is usually quite accurate but there are transcription errors that can and very often do occur. I apologize for any typographical errors that were not detected and corrected.    China Deitrick, MD 12/30/20252:08 PM     [1]  Allergies Allergen Reactions   Vascepa [Icosapent Ethyl (Epa Ethyl Ester) (Fish)] Hives and Swelling   Aspir-81 [Aspirin] Other (See Comments)    Coagulation disorder, causes bruising    Gabapentin  Other (See Comments)    Cognitive/memory issues   Metformin And Related     indigestion   Mounjaro  [Tirzepatide ] Diarrhea    Epigastric pain    Peanut-Containing Drug Products     Tongue goes numb from peanuts   Statins Other (See Comments)    Joint pain   Topamax [Topiramate] Other (See Comments)    Cognitive issues    Voltaren [Diclofenac Sodium] Hives and Other (See Comments)   Lipitor [Atorvastatin]     Joint pain   "

## 2024-03-19 NOTE — Patient Instructions (Signed)
Bupropion Extended-Release Tablets (Depression/Mood Disorders) What is this medication? BUPROPION (byoo PROE pee on) treats depression. It increases norepinephrine and dopamine in the brain, hormones that help regulate mood. It belongs to a group of medications called NDRIs. This medicine may be used for other purposes; ask your health care provider or pharmacist if you have questions. COMMON BRAND NAME(S): Aplenzin, Budeprion XL, Forfivo XL, Wellbutrin XL What should I tell my care team before I take this medication? They need to know if you have any of these conditions: An eating disorder, such as anorexia or bulimia Bipolar disorder or psychosis Diabetes or high blood sugar, treated with medication Glaucoma Head injury or brain tumor Heart disease, previous heart attack, or irregular heart beat High blood pressure Kidney disease Liver disease Seizures Suicidal thoughts, plans, or attempt by you or a family member Tourette syndrome Weight loss An unusual or allergic reaction to bupropion, other medications, foods, dyes, or preservatives Pregnant or trying to become pregnant Breastfeeding How should I use this medication? Take this medication by mouth with a glass of water. Follow the directions on the prescription label. You can take it with or without food. If it upsets your stomach, take it with food. Do not crush, chew, or cut these tablets. This medication is taken once daily at the same time each day. Do not take your medication more often than directed. Do not stop taking this medication suddenly except upon the advice of your care team. Stopping this medication too quickly may cause serious side effects or your condition may worsen. A special MedGuide will be given to you by the pharmacist with each prescription and refill. Be sure to read this information carefully each time. Talk to your care team about the use of this medication in children. Special care may be  needed. Overdosage: If you think you have taken too much of this medicine contact a poison control center or emergency room at once. NOTE: This medicine is only for you. Do not share this medicine with others. What if I miss a dose? If you miss a dose, skip the missed dose and take your next tablet at the regular time. Do not take double or extra doses. What may interact with this medication? Do not take this medication with any of the following: Linezolid MAOIs, such as Azilect, Carbex, Eldepryl, Marplan, Nardil, and Parnate Methylene blue (injected into a vein) Other medications that contain bupropion, such as Zyban This medication may also interact with the following: Alcohol Certain medications for anxiety or sleep Certain medications for blood pressure, such as metoprolol, propranolol Certain medications for HIV or AIDS, such as efavirenz, lopinavir, nelfinavir, ritonavir Certain medications for irregular heartbeat, such as propafenone, flecainide Certain medications for mental health conditions Certain medications for Parkinson disease, such as amantadine, levodopa Certain medications for seizures, such as carbamazepine, phenytoin, phenobarbital Cimetidine Clopidogrel Cyclophosphamide Digoxin Furazolidone Isoniazid Nicotine Orphenadrine Procarbazine Steroid medications, such as prednisone or cortisone Stimulant medications for attention disorders, weight loss, or to stay awake Tamoxifen Theophylline Thiotepa Ticlopidine Tramadol Warfarin This list may not describe all possible interactions. Give your health care provider a list of all the medicines, herbs, non-prescription drugs, or dietary supplements you use. Also tell them if you smoke, drink alcohol, or use illegal drugs. Some items may interact with your medicine. What should I watch for while using this medication? Tell your care team if your symptoms do not get better or if they get worse. Visit your care team for  regular checks  on your progress. Because it may take several weeks to see the full effects of this medication, it is important to continue your treatment as prescribed. This medication may cause thoughts of suicide or depression. This includes sudden changes in mood, behaviors, or thoughts. These changes can happen at any time but are more common in the beginning of treatment or after a change in dose. Call your care team right away if you experience these thoughts or worsening depression. This medication may cause mood and behavior changes, such as anxiety, nervousness, irritability, hostility, restlessness, excitability, hyperactivity, or trouble sleeping. These changes can happen at any time but are more common in the beginning of treatment or after a change in dose. Call your care team right away if you notice any of these symptoms. This medication may cause serious skin reactions. They can happen weeks to months after starting the medication. Contact your care team right away if you notice fevers or flu-like symptoms with a rash. The rash may be red or purple and then turn into blisters or peeling of the skin. You may also notice a red rash with swelling of the face, lips, or lymph nodes in your neck or under your arms. Avoid drinks that contain alcohol while taking this medication. Drinking large amounts of alcohol, using sleeping or anxiety medications, or quickly stopping the use of these agents while taking this medication may increase your risk for a seizure. This medication may affect your coordination, reaction time, or judgment. Do not drive or operate machinery until you know how this medication affects you. Do not take this medication close to bedtime. It may prevent you from sleeping. Your mouth may get dry. Chewing sugarless gum or sucking hard candy and drinking plenty of water may help. Contact your care team if the problem does not go away or is severe. The tablet shell for some brands of  this medication does not dissolve. This is normal. The tablet shell may appear whole in the stool. This is not a cause for concern. What side effects may I notice from receiving this medication? Side effects that you should report to your care team as soon as possible: Allergic reactions--skin rash, itching, hives, swelling of the face, lips, tongue, or throat Increase in blood pressure Mood and behavior changes--anxiety, nervousness, confusion, hallucinations, irritability, hostility, thoughts of suicide or self-harm, worsening mood, feelings of depression Redness, blistering, peeling, or loosening of the skin, including inside the mouth Seizures Sudden eye pain or change in vision such as blurry vision, seeing halos around lights, vision loss Side effects that usually do not require medical attention (report to your care team if they continue or are bothersome): Constipation Dizziness Dry mouth Loss of appetite Nausea Tremors or shaking Trouble sleeping This list may not describe all possible side effects. Call your doctor for medical advice about side effects. You may report side effects to FDA at 1-800-FDA-1088. Where should I keep my medication? Keep out of the reach of children and pets. Store at room temperature between 15 and 30 degrees C (59 and 86 degrees F). Throw away any unused medication after the expiration date. NOTE: This sheet is a summary. It may not cover all possible information. If you have questions about this medicine, talk to your doctor, pharmacist, or health care provider.  2024 Elsevier/Gold Standard (2021-11-29 00:00:00)

## 2024-03-20 ENCOUNTER — Ambulatory Visit: Admitting: Certified Nurse Midwife

## 2024-03-20 ENCOUNTER — Encounter: Payer: Self-pay | Admitting: Certified Nurse Midwife

## 2024-03-20 ENCOUNTER — Other Ambulatory Visit (HOSPITAL_COMMUNITY)
Admission: RE | Admit: 2024-03-20 | Discharge: 2024-03-20 | Disposition: A | Discharge status: Home / Self Care | Source: Ambulatory Visit | Attending: Certified Nurse Midwife | Admitting: Certified Nurse Midwife

## 2024-03-20 VITALS — BP 125/72 | HR 72 | Wt 290.8 lb

## 2024-03-20 DIAGNOSIS — F09 Unspecified mental disorder due to known physiological condition: Secondary | ICD-10-CM | POA: Insufficient documentation

## 2024-03-20 DIAGNOSIS — N950 Postmenopausal bleeding: Secondary | ICD-10-CM | POA: Insufficient documentation

## 2024-03-20 DIAGNOSIS — N858 Other specified noninflammatory disorders of uterus: Secondary | ICD-10-CM

## 2024-03-20 NOTE — Progress Notes (Signed)
 Endometrial Biopsy Procedure Note  Pre-operative Diagnosis: postmenopausal bleeding   Post-operative Diagnosis: same  Indications: postmenopausal bleeding  Procedure Details  The risks (including infection, bleeding, pain, and uterine perforation) and benefits of the procedure were explained to the patient and Written informed consent was obtained.  Antibiotic prophylaxis against endocarditis was not indicated.   The patient was placed in the dorsal lithotomy position.    A speculum was inserted in the vagina, and the cervix prepped with povidone iodine.  Endocervical curettage with a Kevorkian curette was not performed.   A sharp tenaculum was applied to the anterior lip of the cervix for stabilization.  A sterile uterine sound was used to sound the uterus to a depth of 6cm.  A Pipelle endometrial aspirator was used to sample the endometrium.  Sample was sent for pathologic examination.  Condition: Stable  Complications: None  Plan:  The patient was advised to call for any fever or for prolonged or severe pain or bleeding. She was advised to use NSAID as needed for mild to moderate pain. She was advised to avoid vaginal intercourse for 48 hours or until the bleeding has completely stopped.  Zelda Hummer, CNM

## 2024-03-20 NOTE — Telephone Encounter (Signed)
 Requested by interface surescripts. Medication dose discontinued 03/01/24. Future visit 05/21/24.  Requested Prescriptions  Refused Prescriptions Disp Refills   rOPINIRole  (REQUIP ) 1 MG tablet [Pharmacy Med Name: rOPINIRole  HCL 1 MG TABLET] 120 tablet 0    Sig: TAKE 1 TABLET BY MOUTH 4 TIMES A DAY     Neurology:  Parkinsonian Agents Passed - 03/20/2024  4:20 PM      Passed - Last BP in normal range    BP Readings from Last 1 Encounters:  03/20/24 125/72         Passed - Last Heart Rate in normal range    Pulse Readings from Last 1 Encounters:  03/20/24 72         Passed - Valid encounter within last 12 months    Recent Outpatient Visits           2 weeks ago Dyslipidemia associated with type 2 diabetes mellitus [facility])   [place] [facility] Glenard Mire, MD   2 months ago Adult hypothyroidism   [facility] Glenard Mire, MD   2 months ago Leg cramping   [facility] Isaiah LABOR, MD   3 months ago History of COVID-19   [facility] Glenard Mire, MD   4 months ago Dyslipidemia associated with type 2 diabetes mellitus [facility])   [facility] Glenard Mire, MD       Future Appointments             In 2 months Glenard, [name], MD [facility] [place]

## 2024-03-20 NOTE — Progress Notes (Signed)
 Error

## 2024-03-20 NOTE — Progress Notes (Signed)
 GYN ENCOUNTER NOTE  Subjective:       Sarah Gordon is a 73 y.o. G49P1001 female is here for gynecologic evaluation of the following issues:  1. Post menopausal bleeding x 1 in October.      Gynecologic History No LMP recorded. Patient is postmenopausal. Contraception: post menopausal status Last Pap: 01/19/2024. Results were: abnormal. Atypical glandular cells,NOS. Negative HPV Last mammogram: 08/24/2022. Results were: normal  Obstetric History OB History  Gravida Para Term Preterm AB Living  1 1 1   1   SAB IAB Ectopic Multiple Live Births          # Outcome Date GA Lbr Len/2nd Weight Sex Type Anes PTL Lv  1 Term             Obstetric Comments  Menstrual age: 18    Age 1st Pregnancy: 38    Past Medical History:  Diagnosis Date   Allergy    Anxiety 95   Asthma    Cataract    Depression 95   Esophageal reflux    Gouty arthropathy, unspecified    Herpes simplex without mention of complication    Irritable bowel syndrome    Localized osteoarthrosis not specified whether primary or secondary, unspecified site    Migraine, unspecified, without mention of intractable migraine without mention of status migrainosus    Mild cognitive impairment    Mixed hyperlipidemia    Myalgia and myositis, unspecified    Obstructive sleep apnea (adult) (pediatric)    Palpitations    Sleep apnea    No CPAP   Type II or unspecified type diabetes mellitus without mention of complication, not stated as uncontrolled    Diet controlled   Umbilical hernia without mention of obstruction or gangrene    Unspecified essential hypertension    Unspecified hypothyroidism     Past Surgical History:  Procedure Laterality Date   BREAST BIOPSY Left    benign   CATARACT EXTRACTION W/PHACO Left 01/18/2023   Procedure: CATARACT EXTRACTION PHACO AND INTRAOCULAR LENS PLACEMENT (IOC) LEFT DIABETIC  CLAREON VIVITY TORIC LENS;  Surgeon: Jaye Fallow, MD;  Location: [facility];  Service:  Ophthalmology;  Laterality: Left;  4.47 0:31.9   CATARACT EXTRACTION W/PHACO Right 02/01/2023   Procedure: CATARACT EXTRACTION PHACO AND INTRAOCULAR LENS PLACEMENT (IOC) RIGHT DIABETIC  CLAREON VIVITY TORIC LENS 3.68 00:28.9;  Surgeon: Jaye Fallow, MD;  Location: [facility];  Service: Ophthalmology;  Laterality: Right;   CHOLECYSTECTOMY     COLONOSCOPY     Dr Desiderio   COLONOSCOPY WITH PROPOFOL  N/A 11/19/2015   Procedure: COLONOSCOPY WITH PROPOFOL ;  Surgeon: Louanne KANDICE Muse, MD;  Location: [facility];  Service: Endoscopy;  Laterality: N/A;   COLONOSCOPY WITH PROPOFOL  N/A 06/18/2021   Procedure: COLONOSCOPY WITH PROPOFOL ;  Surgeon: Unk Corinn Skiff, MD;  Location: [facility];  Service: Gastroenterology;  Laterality: N/A;   ESOPHAGOGASTRODUODENOSCOPY N/A 08/20/2014   Procedure: ESOPHAGOGASTRODUODENOSCOPY (EGD);  Surgeon: Louanne KANDICE Muse, MD;  Location: [facility];  Service: Endoscopy;  Laterality: N/A;   ESOPHAGOGASTRODUODENOSCOPY (EGD) WITH PROPOFOL  N/A 06/18/2021   Procedure: ESOPHAGOGASTRODUODENOSCOPY (EGD) WITH PROPOFOL ;  Surgeon: Unk Corinn Skiff, MD;  Location: [facility] ENDOSCOPY;  Service: Gastroenterology;  Laterality: N/A;   EYE SURGERY     Cataract removal   FOOT SURGERY Left    heel spur   HERNIA REPAIR     umbilical   NASAL SINUS SURGERY  1980s   UMBILICAL HERNIA REPAIR      Medications Ordered Prior to Encounter[1]  Allergies[2]  Social History   Socioeconomic History   Marital status: Married    Spouse name: Erla    Number of children: 1   Years of education: Not on file   Highest education level: Some college, no degree  Occupational History    Comment: works part time at [employer]  Tobacco Use   Smoking status: Never    Passive exposure: Never   Smokeless tobacco: Never  Vaping Use   Vaping status: Never Used  Substance and Sexual Activity   Alcohol use: No   Drug use: No   Sexual activity: Not Currently    Birth  control/protection: Post-menopausal  Other Topics Concern   Not on file  Social History Narrative   Not on file   Social Drivers of Health   Tobacco Use: Low Risk (03/19/2024)   Patient History    Smoking Tobacco Use: Never    Smokeless Tobacco Use: Never    Passive Exposure: Never  Financial Resource Strain: Low Risk (01/12/2024)   Overall Financial Resource Strain (CARDIA)    Difficulty of Paying Living Expenses: Not very hard  Recent Concern: Financial Resource Strain - Medium Risk (10/28/2023)   Overall Financial Resource Strain (CARDIA)    Difficulty of Paying Living Expenses: Somewhat hard  Food Insecurity: Food Insecurity Present (01/12/2024)   Epic    Worried About Programme Researcher, Broadcasting/film/video in the Last Year: Never true    Ran Out of Food in the Last Year: Sometimes true  Transportation Needs: Unmet Transportation Needs (01/12/2024)   Epic    Lack of Transportation (Medical): No    Lack of Transportation (Non-Medical): Yes  Physical Activity: Insufficiently Active (01/12/2024)   Exercise Vital Sign    Days of Exercise per Week: 2 days    Minutes of Exercise per Session: 20 min  Stress: No Stress Concern Present (01/12/2024)   [employer] of Occupational Health - Occupational Stress Questionnaire    Feeling of Stress: Only a little  Social Connections: Moderately Integrated (01/12/2024)   Social Connection and Isolation Panel    Frequency of Communication with Friends and Family: More than three times a week    Frequency of Social Gatherings with Friends and Family: Twice a week    Attends Religious Services: Never    Database Administrator or Organizations: Yes    Attends Engineer, Structural: More than 4 times per year    Marital Status: Married  Catering Manager Violence: Not At Risk (09/04/2023)   Epic    Fear of Current or Ex-Partner: No    Emotionally Abused: No    Physically Abused: No    Sexually Abused: No  Depression (PHQ2-9): High Risk  (03/20/2024)   Depression (PHQ2-9)    PHQ-2 Score: 11  Alcohol Screen: Low Risk (06/16/2023)   Alcohol Screen    Last Alcohol Screening Score (AUDIT): 0  Housing: High Risk (01/12/2024)   Epic    Unable to Pay for Housing in the Last Year: Yes    Number of Times Moved in the Last Year: Not on file    Homeless in the Last Year: No  Utilities: Not At Risk (09/04/2023)   Epic    Threatened with loss of utilities: No  Health Literacy: Not on file    Family History  Problem Relation Age of Onset   Hypertension Mother    Hyperlipidemia Mother    Diabetes Mother    Heart disease Mother    Stroke Mother  Varicose Veins Mother    Obesity Mother    Alcohol abuse Father    Bipolar disorder Sister    Lupus Sister    Arthritis Sister    Depression Sister    Obesity Sister    Dementia Sister 66       Alzheimer's dementia   Asthma Sister    Depression Sister    Hypertension Sister    Miscarriages / Stillbirths Sister    Varicose Veins Sister    Depression Sister    Arthritis Sister    Heart disease Sister    Hyperlipidemia Sister    Hypertension Sister    Miscarriages / Stillbirths Sister    Varicose Veins Sister    Dementia Sister    Dementia Sister        due to head trauma   Miscarriages / Stillbirths Sister    Arthritis Sister    Depression Sister    Heart disease Sister    Hyperlipidemia Sister    Hypertension Sister    Stroke Sister    Heart disease Brother 27       CABG x 3    Heart attack Brother 19   Hyperlipidemia Brother    Hypertension Brother    Bipolar disorder Brother    Diabetes Brother    Hypertension Brother    Obesity Brother    Asthma Brother    Depression Brother    Hyperlipidemia Brother    Alcohol abuse Brother    Heart disease Brother    Stroke Brother    Varicose Veins Brother    Early death Brother    Stroke Brother    Arthritis Brother    Depression Brother    Hyperlipidemia Brother    Hypertension Brother    Stroke Maternal  Grandfather    Mental illness Paternal Grandmother    Breast cancer Neg Hx     The following portions of the patient's history were reviewed and updated as appropriate: allergies, current medications, past family history, past medical history, past social history, past surgical history and problem list.  Review of Systems Review of Systems - Negative except as mentioned in HPI Review of Systems - General ROS: negative for - chills, fatigue, fever, hot flashes, malaise or night sweats Hematological and Lymphatic ROS: negative for - bleeding problems or swollen lymph nodes Gastrointestinal ROS: negative for - abdominal pain, blood in stools, change in bowel habits and nausea/vomiting Musculoskeletal ROS: negative for - joint pain, muscle pain or muscular weakness Genito-Urinary ROS: negative for - change in menstrual cycle, dysmenorrhea, dyspareunia, dysuria, genital discharge, genital ulcers, hematuria, incontinence, irregular/heavy menses, nocturia or pelvic painjj  Objective:   BP 125/72   Pulse 72   Wt 290 lb 12.8 oz (131.9 kg)   BMI 46.94 kg/m  CONSTITUTIONAL: Well-developed, well-nourished female in no acute distress.  HENT:  Normocephalic, atraumatic.  NECK: Normal range of motion, supple, no masses.  Normal thyroid .  SKIN: Skin is warm and dry. No rash noted. Not diaphoretic. No erythema. No pallor. NEUROLGIC: Alert and oriented to person, place, and time. PSYCHIATRIC: Normal mood and affect. Normal behavior. Normal judgment and thought content. CARDIOVASCULAR:Not Examined RESPIRATORY: Not Examined BREASTS: Not Examined ABDOMEN: Soft, non distended; Non tender.  No Organomegaly. PELVIC:  External Genitalia: Normal  BUS: Normal  Vagina: Normal  Cervix: Normal   MUSCULOSKELETAL: Normal range of motion. No tenderness.  No cyanosis, clubbing, or edema.   CLINICAL DATA:  post menopausal bleeding   EXAM: TRANSABDOMINAL AND TRANSVAGINAL  ULTRASOUND OF PELVIS    TECHNIQUE: Both transabdominal and transvaginal ultrasound examinations of the pelvis were performed. Transabdominal technique was performed for global imaging of the pelvis including uterus, ovaries, adnexal regions, and pelvic cul-de-sac. It was necessary to proceed with endovaginal exam following the transabdominal exam to visualize the uterus, endometrium and bilateral ovaries/adnexa.   COMPARISON:  None Available.   FINDINGS: Uterus   Measurements: 2.6 x 3.8 x 7.2 cm. = volume: 36.9 mL. No fibroids or other mass visualized.   Endometrium   Thickness: 3.5 mm.  No focal abnormality visualized.   Right ovary   Not visualized.  No large adnexal mass seen.   Left ovary   Not visualized.  No large adnexal mass seen.   Other findings   No abnormal free fluid.   IMPRESSION: 1. Unremarkable uterus. 2. Nonvisualization of bilateral ovaries. No large adnexal mass seen.     Electronically Signed   By: Ree Molt M.D.   On: 01/22/2024 14:19    Assessment:   Postmenopausal bleeding    Plan:   Dicussed ultrasound results with patient and her family member. Given Endometrial strip WNL discussed watchful waiting vs. Endometrial Biopsy Pt would like to proceed with endometrial biopsy . See note for biopsy procedure.   Zelda Hummer, CNM      [1]  Current Outpatient Medications on File Prior to Visit  Medication Sig Dispense Refill   acetaminophen  (TYLENOL ) 500 MG tablet Take 1,000 mg by mouth every 6 (six) hours as needed for moderate pain (pain score 4-6).     acyclovir  (ZOVIRAX ) 400 MG tablet Take 1 tablet (400 mg total) by mouth 3 (three) times daily. 30 tablet 1   baclofen  (LIORESAL ) 10 MG tablet Take 10 mg by mouth daily.     donepezil (ARICEPT) 10 MG tablet Take 10 mg by mouth daily.     DULoxetine  (CYMBALTA ) 60 MG capsule Take 1 capsule (60 mg total) by mouth daily. 90 capsule 1   ezetimibe  (ZETIA ) 10 MG tablet Take 1 tablet (10 mg total) by mouth  daily. 90 tablet 1   folic acid  (FOLVITE ) 800 MCG tablet Take 800 mcg by mouth daily.     hydrOXYzine  (ATARAX ) 10 MG tablet Take 1 tablet (10 mg total) by mouth 3 (three) times daily as needed for anxiety. 30 tablet 0   levothyroxine  (SYNTHROID ) 112 MCG tablet Take 1 tablet (112 mcg total) by mouth daily. 30 tablet 3   lidocaine  (LIDODERM ) 5 % Place 1 patch onto the skin daily.     metoprolol  succinate (TOPROL -XL) 25 MG 24 hr tablet Take 1 tablet (25 mg total) by mouth daily. 90 tablet 1   montelukast  (SINGULAIR ) 10 MG tablet Take 1 tablet (10 mg total) by mouth at bedtime. 90 tablet 1   Multiple Vitamin (MULTIVITAMIN) tablet Take 1 tablet by mouth daily.     olmesartan -hydrochlorothiazide  (BENICAR  HCT) 40-12.5 MG tablet Take 1 tablet by mouth daily. 90 tablet 1   omeprazole  (PRILOSEC) 20 MG capsule TAKE 1 CAPSULE BY MOUTH 2 TIMES A DAY BEFORE A MEAL 180 capsule 0   ondansetron  (ZOFRAN ) 4 MG tablet Take 1 tablet (4 mg total) by mouth every 8 (eight) hours as needed for nausea or vomiting. 20 tablet 0   pregabalin (LYRICA) 25 MG capsule Take 25 mg by mouth 2 (two) times daily.     QUEtiapine  (SEROQUEL ) 50 MG tablet Take 50 mg by mouth at bedtime.     rOPINIRole  (REQUIP ) 2 MG tablet Take  1 mg by mouth. QID     buPROPion  (WELLBUTRIN  XL) 300 MG 24 hr tablet Take 1 tablet (300 mg total) by mouth in the morning. (Patient not taking: Reported on 03/20/2024) 90 tablet 0   Polyethyl Glycol-Propyl Glycol (SYSTANE OP) Place 1 drop into both eyes daily as needed (dry eyes). (Patient not taking: Reported on 03/20/2024)     Semaglutide ,0.25 or 0.5MG /DOS, (OZEMPIC , 0.25 OR 0.5 MG/DOSE,) 2 MG/3ML SOPN Inject 0.5 mg into the skin once a week. (Patient not taking: Reported on 03/20/2024) 3 mL 0   traMADol  (ULTRAM ) 50 MG tablet Take 50 mg by mouth every 6 (six) hours as needed. (Patient not taking: Reported on 03/20/2024)     No current facility-administered medications on file prior to visit.  [2]   Allergies Allergen Reactions   Vascepa [Icosapent Ethyl (Epa Ethyl Ester) (Fish)] Hives and Swelling   Aspir-81 [Aspirin] Other (See Comments)    Coagulation disorder, causes bruising    Gabapentin  Other (See Comments)    Cognitive/memory issues   Metformin And Related     indigestion   Mounjaro  [Tirzepatide ] Diarrhea    Epigastric pain    Peanut-Containing Drug Products     Tongue goes numb from peanuts   Statins Other (See Comments)    Joint pain   Topamax [Topiramate] Other (See Comments)    Cognitive issues    Voltaren [Diclofenac Sodium] Hives and Other (See Comments)   Lipitor [Atorvastatin]     Joint pain

## 2024-03-21 ENCOUNTER — Other Ambulatory Visit: Payer: Self-pay | Admitting: Family Medicine

## 2024-03-21 DIAGNOSIS — R252 Cramp and spasm: Secondary | ICD-10-CM

## 2024-03-22 ENCOUNTER — Encounter: Payer: Self-pay | Admitting: Family Medicine

## 2024-03-22 NOTE — Telephone Encounter (Signed)
 Requested Prescriptions  Refused Prescriptions Disp Refills   rOPINIRole  (REQUIP ) 0.5 MG tablet [Pharmacy Med Name: rOPINIRole  HCL 0.5 MG TABLET] 90 tablet 1    Sig: TAKE 1 TABLET BY MOUTH AT BEDTIME     Neurology:  Parkinsonian Agents Passed - 03/22/2024 11:28 AM      Passed - Last BP in normal range    BP Readings from Last 1 Encounters:  03/20/24 125/72         Passed - Last Heart Rate in normal range    Pulse Readings from Last 1 Encounters:  03/20/24 72         Passed - Valid encounter within last 12 months    Recent Outpatient Visits           2 weeks ago Dyslipidemia associated with type 2 diabetes mellitus [facility])   [facility] Glenard Mire, MD   2 months ago Adult hypothyroidism   [facility] Glenard Mire, MD   2 months ago Leg cramping   [facility] Franchot Isaiah LABOR, MD   3 months ago History of COVID-19   [facility] Glenard Mire, MD   4 months ago Dyslipidemia associated with type 2 diabetes mellitus [facility])   [facility] Glenard Mire, MD       Future Appointments             In 2 months Glenard, [name], MD [facility] [place]

## 2024-03-23 ENCOUNTER — Other Ambulatory Visit: Payer: Self-pay | Admitting: Family Medicine

## 2024-03-23 LAB — SURGICAL PATHOLOGY

## 2024-03-23 MED ORDER — ROPINIROLE HCL 2 MG PO TABS: 2.0000 mg | ORAL_TABLET | Freq: Every day | ORAL | 0 refills | Status: AC

## 2024-03-26 ENCOUNTER — Encounter: Payer: Self-pay | Admitting: Certified Nurse Midwife

## 2024-03-29 ENCOUNTER — Other Ambulatory Visit: Payer: Self-pay | Admitting: Family Medicine

## 2024-03-29 DIAGNOSIS — E119 Type 2 diabetes mellitus without complications: Secondary | ICD-10-CM

## 2024-03-29 DIAGNOSIS — E1169 Type 2 diabetes mellitus with other specified complication: Secondary | ICD-10-CM

## 2024-03-29 DIAGNOSIS — N1831 Chronic kidney disease, stage 3a: Secondary | ICD-10-CM

## 2024-03-29 NOTE — Telephone Encounter (Signed)
 Requested Prescriptions  Pending Prescriptions Disp Refills   OZEMPIC , 0.25 OR 0.5 MG/DOSE, 2 MG/3ML SOPN [Pharmacy Med Name: OZEMPIC  0.25-0.5 MG/DOSE(2 MG/3 ML)] 3 mL 0    Sig: DIAL AND INJECT UNDER Sarah SKIN 0.5 MG WEEKLY     Endocrinology:  Diabetes - GLP-1 Receptor Agonists - semaglutide  Failed - 03/29/2024  4:18 PM      Failed - HBA1C in normal range and within 180 days    Hemoglobin A1C  Date Value Ref Range Status  03/05/2024 6.3 (A) 4.0 - 5.6 % Final   HbA1c, POC (controlled diabetic range)  Date Value Ref Range Status  12/21/2017 5.7 0.0 - 7.0 % Final   Hgb A1c MFr Bld  Date Value Ref Range Status  05/25/2023 6.5 (H) 4.8 - 5.6 % Final    Comment:    (NOTE) Pre diabetes:          5.7%-6.4%  Diabetes:              >6.4%  Glycemic control for   <7.0% adults with diabetes          Failed - Cr in normal range and within 360 days    Creat  Date Value Ref Range Status  11/03/2023 1.21 (H) 0.60 - 1.00 mg/dL Final   Creatinine, Ser  Date Value Ref Range Status  01/12/2024 1.21 (H) 0.57 - 1.00 mg/dL Final   Creatinine, POC  Date Value Ref Range Status  10/18/2023 1.09 mg/dL Final    Comment:    Abstracted by HIM   Creatinine, Urine  Date Value Ref Range Status  08/20/2022 88 20 - 275 mg/dL Final         Passed - Valid encounter within last 6 months    Recent Outpatient Visits           3 weeks ago Dyslipidemia associated with type 2 diabetes mellitus Sarah Gordon)   Sarah Gordon Sarah Mire, Sarah Gordon   2 months ago Adult hypothyroidism   Sarah Gordon Sarah Mire, Sarah Gordon   2 months ago Leg cramping   [facility] Sarah Isaiah LABOR, Sarah Gordon   3 months ago History of COVID-19   Sarah Gordon Sarah Mire, Sarah Gordon   4 months ago Dyslipidemia associated with type 2 diabetes mellitus Sarah Gordon)   Sarah Gordon Sarah Mire, Sarah Gordon       Future Appointments              In 1 month Sarah Gordon, [name], Sarah Gordon [facility], Sarah Gordon

## 2024-04-12 ENCOUNTER — Encounter: Payer: Self-pay | Admitting: Family Medicine

## 2024-04-17 ENCOUNTER — Encounter: Payer: Self-pay | Admitting: Family Medicine

## 2024-04-18 ENCOUNTER — Ambulatory Visit: Admitting: Professional Counselor

## 2024-04-18 ENCOUNTER — Other Ambulatory Visit: Payer: Self-pay | Admitting: Family Medicine

## 2024-05-03 ENCOUNTER — Ambulatory Visit: Admitting: Professional Counselor

## 2024-05-08 ENCOUNTER — Ambulatory Visit: Admitting: Psychiatry

## 2024-05-21 ENCOUNTER — Ambulatory Visit: Payer: Medicare HMO | Admitting: Family Medicine

## 2024-05-30 ENCOUNTER — Ambulatory Visit: Admitting: Sleep Medicine

## 2024-06-21 ENCOUNTER — Ambulatory Visit

## 2024-07-04 ENCOUNTER — Ambulatory Visit: Admitting: Family Medicine
